# Patient Record
Sex: Female | Born: 1950 | Race: Black or African American | Hispanic: No | Smoking: Former smoker
Health system: Southern US, Community
[De-identification: ages and names within clinical notes are randomized; demographics above are authoritative.]

## PROBLEM LIST (undated history)

## (undated) DIAGNOSIS — M1990 Unspecified osteoarthritis, unspecified site: Secondary | ICD-10-CM

## (undated) DIAGNOSIS — G4700 Insomnia, unspecified: Secondary | ICD-10-CM

## (undated) DIAGNOSIS — G2581 Restless legs syndrome: Secondary | ICD-10-CM

## (undated) DIAGNOSIS — D649 Anemia, unspecified: Secondary | ICD-10-CM

## (undated) DIAGNOSIS — D571 Sickle-cell disease without crisis: Secondary | ICD-10-CM

## (undated) DIAGNOSIS — E0590 Thyrotoxicosis, unspecified without thyrotoxic crisis or storm: Secondary | ICD-10-CM

## (undated) DIAGNOSIS — N83209 Unspecified ovarian cyst, unspecified side: Secondary | ICD-10-CM

## (undated) DIAGNOSIS — J40 Bronchitis, not specified as acute or chronic: Secondary | ICD-10-CM

## (undated) DIAGNOSIS — K589 Irritable bowel syndrome without diarrhea: Secondary | ICD-10-CM

## (undated) DIAGNOSIS — H269 Unspecified cataract: Secondary | ICD-10-CM

## (undated) DIAGNOSIS — K5790 Diverticulosis of intestine, part unspecified, without perforation or abscess without bleeding: Secondary | ICD-10-CM

## (undated) DIAGNOSIS — T7840XA Allergy, unspecified, initial encounter: Secondary | ICD-10-CM

## (undated) DIAGNOSIS — R202 Paresthesia of skin: Secondary | ICD-10-CM

## (undated) DIAGNOSIS — I10 Essential (primary) hypertension: Secondary | ICD-10-CM

## (undated) DIAGNOSIS — D573 Sickle-cell trait: Secondary | ICD-10-CM

## (undated) DIAGNOSIS — K219 Gastro-esophageal reflux disease without esophagitis: Secondary | ICD-10-CM

## (undated) DIAGNOSIS — J449 Chronic obstructive pulmonary disease, unspecified: Secondary | ICD-10-CM

## (undated) DIAGNOSIS — D689 Coagulation defect, unspecified: Secondary | ICD-10-CM

## (undated) DIAGNOSIS — I2699 Other pulmonary embolism without acute cor pulmonale: Secondary | ICD-10-CM

## (undated) HISTORY — DX: Unspecified cataract: H26.9

## (undated) HISTORY — DX: Sickle-cell disease without crisis: D57.1

## (undated) HISTORY — DX: Chronic obstructive pulmonary disease, unspecified: J44.9

## (undated) HISTORY — DX: Gastro-esophageal reflux disease without esophagitis: K21.9

## (undated) HISTORY — PX: OTHER SURGICAL HISTORY: SHX169

## (undated) HISTORY — PX: ROTATOR CUFF REPAIR: SHX139

## (undated) HISTORY — DX: Irritable bowel syndrome, unspecified: K58.9

## (undated) HISTORY — DX: Unspecified ovarian cyst, unspecified side: N83.209

## (undated) HISTORY — DX: Insomnia, unspecified: G47.00

## (undated) HISTORY — DX: Diverticulosis of intestine, part unspecified, without perforation or abscess without bleeding: K57.90

## (undated) HISTORY — PX: KNEE ARTHROSCOPY: SUR90

## (undated) HISTORY — DX: Paresthesia of skin: R20.2

## (undated) HISTORY — PX: TUBAL LIGATION: SHX77

## (undated) HISTORY — PX: OOPHORECTOMY: SHX86

## (undated) HISTORY — DX: Allergy, unspecified, initial encounter: T78.40XA

## (undated) HISTORY — DX: Coagulation defect, unspecified: D68.9

## (undated) HISTORY — DX: Essential (primary) hypertension: I10

## (undated) HISTORY — DX: Bronchitis, not specified as acute or chronic: J40

## (undated) HISTORY — DX: Unspecified osteoarthritis, unspecified site: M19.90

## (undated) HISTORY — PX: UPPER GASTROINTESTINAL ENDOSCOPY: SHX188

## (undated) HISTORY — PX: COLONOSCOPY: SHX174

---

## 1994-11-24 ENCOUNTER — Encounter: Payer: Self-pay | Admitting: Gastroenterology

## 1997-08-05 ENCOUNTER — Encounter: Payer: Self-pay | Admitting: Gastroenterology

## 1997-10-22 ENCOUNTER — Encounter: Payer: Self-pay | Admitting: Emergency Medicine

## 1997-10-22 ENCOUNTER — Emergency Department (HOSPITAL_COMMUNITY): Admission: EM | Admit: 1997-10-22 | Discharge: 1997-10-22 | Payer: Self-pay | Admitting: Emergency Medicine

## 1998-01-29 ENCOUNTER — Ambulatory Visit (HOSPITAL_COMMUNITY): Admission: RE | Admit: 1998-01-29 | Discharge: 1998-01-29 | Payer: Self-pay | Admitting: Gastroenterology

## 1999-01-14 ENCOUNTER — Encounter: Admission: RE | Admit: 1999-01-14 | Discharge: 1999-01-14 | Payer: Self-pay | Admitting: Gynecology

## 1999-01-14 ENCOUNTER — Encounter: Payer: Self-pay | Admitting: Gynecology

## 1999-01-20 ENCOUNTER — Encounter: Admission: RE | Admit: 1999-01-20 | Discharge: 1999-01-20 | Payer: Self-pay | Admitting: Gynecology

## 1999-01-20 ENCOUNTER — Encounter: Payer: Self-pay | Admitting: Gynecology

## 1999-12-02 ENCOUNTER — Other Ambulatory Visit: Admission: RE | Admit: 1999-12-02 | Discharge: 1999-12-02 | Payer: Self-pay | Admitting: Gynecology

## 2000-02-01 ENCOUNTER — Encounter: Admission: RE | Admit: 2000-02-01 | Discharge: 2000-02-01 | Payer: Self-pay | Admitting: Gynecology

## 2000-02-01 ENCOUNTER — Encounter: Payer: Self-pay | Admitting: Gynecology

## 2000-03-29 ENCOUNTER — Ambulatory Visit (HOSPITAL_BASED_OUTPATIENT_CLINIC_OR_DEPARTMENT_OTHER): Admission: RE | Admit: 2000-03-29 | Discharge: 2000-03-29 | Payer: Self-pay | Admitting: Critical Care Medicine

## 2000-06-13 ENCOUNTER — Other Ambulatory Visit: Admission: RE | Admit: 2000-06-13 | Discharge: 2000-06-13 | Payer: Self-pay | Admitting: Gynecology

## 2000-06-15 ENCOUNTER — Encounter (INDEPENDENT_AMBULATORY_CARE_PROVIDER_SITE_OTHER): Payer: Self-pay | Admitting: Specialist

## 2000-06-15 ENCOUNTER — Encounter: Payer: Self-pay | Admitting: Gastroenterology

## 2000-06-15 ENCOUNTER — Other Ambulatory Visit: Admission: RE | Admit: 2000-06-15 | Discharge: 2000-06-15 | Payer: Self-pay | Admitting: Gastroenterology

## 2001-10-10 ENCOUNTER — Emergency Department (HOSPITAL_COMMUNITY): Admission: EM | Admit: 2001-10-10 | Discharge: 2001-10-10 | Payer: Self-pay | Admitting: Emergency Medicine

## 2001-12-28 ENCOUNTER — Other Ambulatory Visit: Admission: RE | Admit: 2001-12-28 | Discharge: 2001-12-28 | Payer: Self-pay | Admitting: Gynecology

## 2002-07-09 ENCOUNTER — Encounter: Admission: RE | Admit: 2002-07-09 | Discharge: 2002-07-09 | Payer: Self-pay | Admitting: Internal Medicine

## 2002-07-09 ENCOUNTER — Encounter: Payer: Self-pay | Admitting: Internal Medicine

## 2002-10-30 ENCOUNTER — Encounter: Payer: Self-pay | Admitting: Internal Medicine

## 2002-10-30 ENCOUNTER — Encounter: Admission: RE | Admit: 2002-10-30 | Discharge: 2002-10-30 | Payer: Self-pay | Admitting: Internal Medicine

## 2003-02-07 ENCOUNTER — Other Ambulatory Visit: Admission: RE | Admit: 2003-02-07 | Discharge: 2003-02-07 | Payer: Self-pay | Admitting: Gynecology

## 2003-12-17 ENCOUNTER — Ambulatory Visit: Payer: Self-pay | Admitting: Pulmonary Disease

## 2004-01-15 ENCOUNTER — Ambulatory Visit: Payer: Self-pay | Admitting: Internal Medicine

## 2004-01-21 ENCOUNTER — Ambulatory Visit: Payer: Self-pay | Admitting: Pulmonary Disease

## 2004-02-19 ENCOUNTER — Other Ambulatory Visit: Admission: RE | Admit: 2004-02-19 | Discharge: 2004-02-19 | Payer: Self-pay | Admitting: Gynecology

## 2004-02-24 ENCOUNTER — Ambulatory Visit: Payer: Self-pay | Admitting: Internal Medicine

## 2004-03-10 ENCOUNTER — Ambulatory Visit: Payer: Self-pay | Admitting: Internal Medicine

## 2004-03-14 ENCOUNTER — Ambulatory Visit: Payer: Self-pay | Admitting: Internal Medicine

## 2004-03-20 ENCOUNTER — Ambulatory Visit: Payer: Self-pay | Admitting: Internal Medicine

## 2004-04-27 ENCOUNTER — Encounter: Admission: RE | Admit: 2004-04-27 | Discharge: 2004-04-27 | Payer: Self-pay | Admitting: Gynecology

## 2004-06-15 ENCOUNTER — Ambulatory Visit: Payer: Self-pay | Admitting: Gastroenterology

## 2004-06-18 ENCOUNTER — Ambulatory Visit: Payer: Self-pay | Admitting: Internal Medicine

## 2004-06-24 ENCOUNTER — Ambulatory Visit: Payer: Self-pay | Admitting: Internal Medicine

## 2004-07-15 ENCOUNTER — Ambulatory Visit: Payer: Self-pay | Admitting: Internal Medicine

## 2004-09-23 ENCOUNTER — Ambulatory Visit: Payer: Self-pay | Admitting: Internal Medicine

## 2004-11-06 ENCOUNTER — Ambulatory Visit: Payer: Self-pay | Admitting: Internal Medicine

## 2004-11-09 ENCOUNTER — Ambulatory Visit: Payer: Self-pay | Admitting: Internal Medicine

## 2004-11-20 ENCOUNTER — Ambulatory Visit: Payer: Self-pay | Admitting: Pulmonary Disease

## 2004-11-30 ENCOUNTER — Ambulatory Visit: Payer: Self-pay | Admitting: Internal Medicine

## 2004-12-15 ENCOUNTER — Ambulatory Visit: Payer: Self-pay | Admitting: Internal Medicine

## 2005-01-26 ENCOUNTER — Ambulatory Visit: Payer: Self-pay | Admitting: Internal Medicine

## 2005-01-27 ENCOUNTER — Ambulatory Visit: Payer: Self-pay | Admitting: Internal Medicine

## 2005-02-04 ENCOUNTER — Ambulatory Visit: Payer: Self-pay | Admitting: Internal Medicine

## 2005-03-10 ENCOUNTER — Ambulatory Visit: Payer: Self-pay | Admitting: Internal Medicine

## 2005-03-31 ENCOUNTER — Ambulatory Visit: Payer: Self-pay | Admitting: Internal Medicine

## 2005-04-09 ENCOUNTER — Ambulatory Visit: Payer: Self-pay | Admitting: Pulmonary Disease

## 2005-05-06 ENCOUNTER — Ambulatory Visit: Payer: Self-pay | Admitting: Internal Medicine

## 2005-05-17 ENCOUNTER — Ambulatory Visit: Payer: Self-pay | Admitting: Pulmonary Disease

## 2005-06-08 ENCOUNTER — Ambulatory Visit: Payer: Self-pay | Admitting: Internal Medicine

## 2005-06-16 ENCOUNTER — Encounter: Payer: Self-pay | Admitting: Emergency Medicine

## 2005-08-03 ENCOUNTER — Other Ambulatory Visit: Admission: RE | Admit: 2005-08-03 | Discharge: 2005-08-03 | Payer: Self-pay | Admitting: Gynecology

## 2005-08-10 ENCOUNTER — Ambulatory Visit: Payer: Self-pay | Admitting: Internal Medicine

## 2005-08-25 ENCOUNTER — Ambulatory Visit: Payer: Self-pay | Admitting: Gastroenterology

## 2005-09-02 ENCOUNTER — Ambulatory Visit: Payer: Self-pay | Admitting: Cardiology

## 2005-09-06 ENCOUNTER — Ambulatory Visit: Payer: Self-pay | Admitting: Internal Medicine

## 2005-09-08 ENCOUNTER — Ambulatory Visit: Payer: Self-pay | Admitting: Gastroenterology

## 2005-09-08 LAB — HM COLONOSCOPY

## 2005-09-28 ENCOUNTER — Ambulatory Visit: Payer: Self-pay | Admitting: Internal Medicine

## 2005-10-14 ENCOUNTER — Ambulatory Visit: Payer: Self-pay | Admitting: Internal Medicine

## 2005-11-09 ENCOUNTER — Ambulatory Visit: Payer: Self-pay | Admitting: Internal Medicine

## 2005-11-10 ENCOUNTER — Ambulatory Visit: Payer: Self-pay | Admitting: Internal Medicine

## 2005-12-01 ENCOUNTER — Ambulatory Visit: Payer: Self-pay | Admitting: Internal Medicine

## 2005-12-08 ENCOUNTER — Ambulatory Visit: Payer: Self-pay | Admitting: Internal Medicine

## 2005-12-10 ENCOUNTER — Ambulatory Visit: Payer: Self-pay | Admitting: Internal Medicine

## 2005-12-15 ENCOUNTER — Ambulatory Visit: Payer: Self-pay | Admitting: Internal Medicine

## 2005-12-24 ENCOUNTER — Ambulatory Visit: Payer: Self-pay | Admitting: Internal Medicine

## 2005-12-27 ENCOUNTER — Ambulatory Visit: Payer: Self-pay | Admitting: Internal Medicine

## 2006-01-03 ENCOUNTER — Ambulatory Visit: Payer: Self-pay | Admitting: Internal Medicine

## 2006-01-05 ENCOUNTER — Ambulatory Visit: Payer: Self-pay | Admitting: Internal Medicine

## 2006-01-11 ENCOUNTER — Ambulatory Visit: Payer: Self-pay | Admitting: Internal Medicine

## 2006-01-17 ENCOUNTER — Ambulatory Visit: Payer: Self-pay | Admitting: Internal Medicine

## 2006-02-01 ENCOUNTER — Ambulatory Visit: Payer: Self-pay | Admitting: Internal Medicine

## 2006-02-16 ENCOUNTER — Ambulatory Visit: Payer: Self-pay | Admitting: Internal Medicine

## 2006-03-02 ENCOUNTER — Ambulatory Visit: Payer: Self-pay | Admitting: Internal Medicine

## 2006-03-08 ENCOUNTER — Ambulatory Visit: Payer: Self-pay | Admitting: Internal Medicine

## 2006-03-25 ENCOUNTER — Ambulatory Visit: Payer: Self-pay | Admitting: Internal Medicine

## 2006-04-01 ENCOUNTER — Ambulatory Visit: Payer: Self-pay | Admitting: Internal Medicine

## 2006-04-11 ENCOUNTER — Ambulatory Visit: Payer: Self-pay | Admitting: Internal Medicine

## 2006-04-22 ENCOUNTER — Ambulatory Visit: Payer: Self-pay | Admitting: Internal Medicine

## 2006-04-29 ENCOUNTER — Ambulatory Visit: Payer: Self-pay | Admitting: Internal Medicine

## 2006-05-13 ENCOUNTER — Ambulatory Visit: Payer: Self-pay | Admitting: Internal Medicine

## 2006-05-16 ENCOUNTER — Ambulatory Visit: Payer: Self-pay | Admitting: Internal Medicine

## 2006-05-16 ENCOUNTER — Encounter: Payer: Self-pay | Admitting: Internal Medicine

## 2006-05-16 LAB — CONVERTED CEMR LAB
BUN: 6 mg/dL (ref 6–23)
Basophils Absolute: 0 10*3/uL (ref 0.0–0.1)
Basophils Relative: 0.2 % (ref 0.0–1.0)
Bilirubin Urine: NEGATIVE
Bilirubin, Direct: 0.1 mg/dL (ref 0.0–0.3)
Chloride: 107 meq/L (ref 96–112)
Direct LDL: 172.8 mg/dL
Eosinophils Relative: 3.2 % (ref 0.0–5.0)
HCT: 39 % (ref 36.0–46.0)
Hemoglobin, Urine: NEGATIVE
Hemoglobin: 13.7 g/dL (ref 12.0–15.0)
MCHC: 35 g/dL (ref 30.0–36.0)
MCV: 87 fL (ref 78.0–100.0)
Nitrite: NEGATIVE
Platelets: 271 10*3/uL (ref 150–400)
Specific Gravity, Urine: 1.01 (ref 1.000–1.03)
Total Protein, Urine: NEGATIVE mg/dL
VLDL: 23 mg/dL (ref 0–40)
WBC: 4.4 10*3/uL — ABNORMAL LOW (ref 4.5–10.5)

## 2006-06-03 ENCOUNTER — Ambulatory Visit: Payer: Self-pay | Admitting: Internal Medicine

## 2006-06-07 ENCOUNTER — Ambulatory Visit: Payer: Self-pay | Admitting: Internal Medicine

## 2006-06-15 ENCOUNTER — Ambulatory Visit: Payer: Self-pay | Admitting: Internal Medicine

## 2006-06-23 ENCOUNTER — Ambulatory Visit: Payer: Self-pay | Admitting: Internal Medicine

## 2006-07-01 ENCOUNTER — Ambulatory Visit: Payer: Self-pay | Admitting: Internal Medicine

## 2006-07-04 ENCOUNTER — Ambulatory Visit: Payer: Self-pay | Admitting: Internal Medicine

## 2006-07-07 ENCOUNTER — Ambulatory Visit: Payer: Self-pay | Admitting: Internal Medicine

## 2006-07-22 ENCOUNTER — Ambulatory Visit: Payer: Self-pay | Admitting: Internal Medicine

## 2006-07-25 ENCOUNTER — Ambulatory Visit: Payer: Self-pay | Admitting: Internal Medicine

## 2006-09-09 ENCOUNTER — Ambulatory Visit: Payer: Self-pay | Admitting: Internal Medicine

## 2006-09-13 ENCOUNTER — Ambulatory Visit: Payer: Self-pay | Admitting: Internal Medicine

## 2006-10-07 ENCOUNTER — Ambulatory Visit: Payer: Self-pay | Admitting: Internal Medicine

## 2006-10-08 ENCOUNTER — Ambulatory Visit: Payer: Self-pay | Admitting: Internal Medicine

## 2006-10-21 ENCOUNTER — Other Ambulatory Visit: Admission: RE | Admit: 2006-10-21 | Discharge: 2006-10-21 | Payer: Self-pay | Admitting: Gynecology

## 2006-10-26 ENCOUNTER — Ambulatory Visit: Payer: Self-pay | Admitting: Internal Medicine

## 2006-11-07 ENCOUNTER — Ambulatory Visit: Payer: Self-pay | Admitting: Internal Medicine

## 2006-11-07 ENCOUNTER — Ambulatory Visit: Payer: Self-pay | Admitting: Emergency Medicine

## 2006-11-24 DIAGNOSIS — N83209 Unspecified ovarian cyst, unspecified side: Secondary | ICD-10-CM | POA: Insufficient documentation

## 2006-11-24 DIAGNOSIS — H6980 Other specified disorders of Eustachian tube, unspecified ear: Secondary | ICD-10-CM | POA: Insufficient documentation

## 2006-11-24 DIAGNOSIS — H6990 Unspecified Eustachian tube disorder, unspecified ear: Secondary | ICD-10-CM | POA: Insufficient documentation

## 2006-11-24 DIAGNOSIS — J302 Other seasonal allergic rhinitis: Secondary | ICD-10-CM | POA: Insufficient documentation

## 2006-11-24 DIAGNOSIS — R059 Cough, unspecified: Secondary | ICD-10-CM | POA: Insufficient documentation

## 2006-11-24 DIAGNOSIS — J3089 Other allergic rhinitis: Secondary | ICD-10-CM

## 2006-11-24 DIAGNOSIS — L659 Nonscarring hair loss, unspecified: Secondary | ICD-10-CM | POA: Insufficient documentation

## 2006-11-24 DIAGNOSIS — R05 Cough: Secondary | ICD-10-CM

## 2006-11-24 DIAGNOSIS — F17200 Nicotine dependence, unspecified, uncomplicated: Secondary | ICD-10-CM | POA: Insufficient documentation

## 2006-11-24 DIAGNOSIS — K5730 Diverticulosis of large intestine without perforation or abscess without bleeding: Secondary | ICD-10-CM | POA: Insufficient documentation

## 2006-11-24 DIAGNOSIS — Z720 Tobacco use: Secondary | ICD-10-CM | POA: Insufficient documentation

## 2006-11-25 ENCOUNTER — Ambulatory Visit: Payer: Self-pay | Admitting: Internal Medicine

## 2006-12-15 ENCOUNTER — Ambulatory Visit: Payer: Self-pay | Admitting: Internal Medicine

## 2006-12-21 ENCOUNTER — Encounter: Payer: Self-pay | Admitting: Internal Medicine

## 2006-12-21 DIAGNOSIS — Z86010 Personal history of colonic polyps: Secondary | ICD-10-CM | POA: Insufficient documentation

## 2006-12-21 DIAGNOSIS — J45909 Unspecified asthma, uncomplicated: Secondary | ICD-10-CM | POA: Insufficient documentation

## 2006-12-21 DIAGNOSIS — K219 Gastro-esophageal reflux disease without esophagitis: Secondary | ICD-10-CM | POA: Insufficient documentation

## 2006-12-21 DIAGNOSIS — R209 Unspecified disturbances of skin sensation: Secondary | ICD-10-CM | POA: Insufficient documentation

## 2006-12-23 ENCOUNTER — Ambulatory Visit: Payer: Self-pay | Admitting: Internal Medicine

## 2006-12-29 ENCOUNTER — Ambulatory Visit: Payer: Self-pay | Admitting: Internal Medicine

## 2006-12-29 ENCOUNTER — Encounter: Payer: Self-pay | Admitting: Internal Medicine

## 2007-01-10 ENCOUNTER — Emergency Department (HOSPITAL_COMMUNITY): Admission: EM | Admit: 2007-01-10 | Discharge: 2007-01-10 | Payer: Self-pay | Admitting: Emergency Medicine

## 2007-01-11 ENCOUNTER — Ambulatory Visit: Payer: Self-pay | Admitting: Internal Medicine

## 2007-01-19 ENCOUNTER — Ambulatory Visit: Payer: Self-pay | Admitting: Internal Medicine

## 2007-02-03 ENCOUNTER — Ambulatory Visit: Payer: Self-pay | Admitting: Internal Medicine

## 2007-02-22 ENCOUNTER — Telehealth (INDEPENDENT_AMBULATORY_CARE_PROVIDER_SITE_OTHER): Payer: Self-pay | Admitting: *Deleted

## 2007-02-28 ENCOUNTER — Ambulatory Visit: Payer: Self-pay | Admitting: Internal Medicine

## 2007-02-28 ENCOUNTER — Ambulatory Visit: Payer: Self-pay | Admitting: Gastroenterology

## 2007-02-28 LAB — CONVERTED CEMR LAB
Alkaline Phosphatase: 79 units/L (ref 39–117)
Basophils Absolute: 0 10*3/uL (ref 0.0–0.1)
CO2: 32 meq/L (ref 19–32)
Calcium: 9.4 mg/dL (ref 8.4–10.5)
Chloride: 103 meq/L (ref 96–112)
Creatinine, Ser: 0.7 mg/dL (ref 0.4–1.2)
Eosinophils Absolute: 0.2 10*3/uL (ref 0.0–0.6)
Eosinophils Relative: 3.8 % (ref 0.0–5.0)
GFR calc Af Amer: 112 mL/min
Glucose, Bld: 95 mg/dL (ref 70–99)
HCT: 38.1 % (ref 36.0–46.0)
Lipase: 21 units/L (ref 11.0–59.0)
MCHC: 34.4 g/dL (ref 30.0–36.0)
MCV: 87.4 fL (ref 78.0–100.0)
Neutro Abs: 1.8 10*3/uL (ref 1.4–7.7)
Neutrophils Relative %: 35.4 % — ABNORMAL LOW (ref 43.0–77.0)
Platelets: 220 10*3/uL (ref 150–400)
RBC: 4.36 M/uL (ref 3.87–5.11)
RDW: 12.5 % (ref 11.5–14.6)
Total Bilirubin: 0.9 mg/dL (ref 0.3–1.2)
Total Protein: 7.5 g/dL (ref 6.0–8.3)
WBC: 5 10*3/uL (ref 4.5–10.5)

## 2007-03-06 ENCOUNTER — Ambulatory Visit: Payer: Self-pay | Admitting: Internal Medicine

## 2007-03-06 ENCOUNTER — Ambulatory Visit: Payer: Self-pay | Admitting: Cardiovascular Disease

## 2007-03-22 ENCOUNTER — Ambulatory Visit: Payer: Self-pay | Admitting: Gastroenterology

## 2007-03-22 ENCOUNTER — Ambulatory Visit: Payer: Self-pay | Admitting: Internal Medicine

## 2007-03-23 ENCOUNTER — Ambulatory Visit: Payer: Self-pay | Admitting: Internal Medicine

## 2007-03-31 ENCOUNTER — Ambulatory Visit: Payer: Self-pay | Admitting: Internal Medicine

## 2007-03-31 ENCOUNTER — Encounter: Payer: Self-pay | Admitting: Internal Medicine

## 2007-04-21 ENCOUNTER — Ambulatory Visit: Payer: Self-pay | Admitting: Internal Medicine

## 2007-04-21 DIAGNOSIS — R5381 Other malaise: Secondary | ICD-10-CM | POA: Insufficient documentation

## 2007-04-21 DIAGNOSIS — G4700 Insomnia, unspecified: Secondary | ICD-10-CM | POA: Insufficient documentation

## 2007-04-21 DIAGNOSIS — R5383 Other fatigue: Secondary | ICD-10-CM

## 2007-04-26 ENCOUNTER — Telehealth: Payer: Self-pay | Admitting: Internal Medicine

## 2007-04-26 LAB — CONVERTED CEMR LAB
Bilirubin Urine: NEGATIVE
Hemoglobin, Urine: NEGATIVE
Ketones, ur: NEGATIVE mg/dL
Mucus, UA: NEGATIVE
Nitrite: NEGATIVE
Specific Gravity, Urine: 1.02 (ref 1.000–1.03)
Total Protein, Urine: NEGATIVE mg/dL
Urobilinogen, UA: 0.2 (ref 0.0–1.0)

## 2007-04-27 ENCOUNTER — Encounter: Payer: Self-pay | Admitting: Internal Medicine

## 2007-05-04 ENCOUNTER — Telehealth: Payer: Self-pay | Admitting: Internal Medicine

## 2007-05-05 ENCOUNTER — Ambulatory Visit: Payer: Self-pay | Admitting: Internal Medicine

## 2007-05-05 LAB — CONVERTED CEMR LAB
Leukocytes, UA: NEGATIVE
Nitrite: NEGATIVE
Specific Gravity, Urine: 1.025 (ref 1.000–1.03)
Urine Glucose: NEGATIVE mg/dL
pH: 5.5 (ref 5.0–8.0)

## 2007-05-19 ENCOUNTER — Ambulatory Visit: Payer: Self-pay | Admitting: Internal Medicine

## 2007-05-20 ENCOUNTER — Ambulatory Visit: Payer: Self-pay | Admitting: Family Medicine

## 2007-06-06 ENCOUNTER — Ambulatory Visit: Payer: Self-pay | Admitting: Internal Medicine

## 2007-06-15 ENCOUNTER — Ambulatory Visit: Payer: Self-pay | Admitting: Internal Medicine

## 2007-06-26 ENCOUNTER — Ambulatory Visit: Payer: Self-pay | Admitting: Internal Medicine

## 2007-07-11 ENCOUNTER — Ambulatory Visit: Payer: Self-pay | Admitting: Internal Medicine

## 2007-07-26 ENCOUNTER — Ambulatory Visit: Payer: Self-pay | Admitting: Internal Medicine

## 2007-08-04 ENCOUNTER — Ambulatory Visit: Payer: Self-pay | Admitting: Internal Medicine

## 2007-08-17 ENCOUNTER — Ambulatory Visit: Payer: Self-pay | Admitting: Internal Medicine

## 2007-08-29 ENCOUNTER — Ambulatory Visit: Payer: Self-pay | Admitting: Internal Medicine

## 2007-09-15 ENCOUNTER — Ambulatory Visit: Payer: Self-pay | Admitting: Internal Medicine

## 2007-09-27 ENCOUNTER — Ambulatory Visit: Payer: Self-pay | Admitting: Internal Medicine

## 2007-09-27 ENCOUNTER — Encounter: Admission: RE | Admit: 2007-09-27 | Discharge: 2007-09-27 | Payer: Self-pay | Admitting: Gynecology

## 2007-10-20 ENCOUNTER — Ambulatory Visit: Payer: Self-pay | Admitting: Internal Medicine

## 2007-10-31 ENCOUNTER — Ambulatory Visit: Payer: Self-pay | Admitting: Internal Medicine

## 2007-10-31 DIAGNOSIS — J039 Acute tonsillitis, unspecified: Secondary | ICD-10-CM | POA: Insufficient documentation

## 2007-11-08 ENCOUNTER — Ambulatory Visit: Payer: Self-pay | Admitting: Internal Medicine

## 2007-12-12 ENCOUNTER — Ambulatory Visit: Payer: Self-pay | Admitting: Internal Medicine

## 2007-12-21 ENCOUNTER — Ambulatory Visit: Payer: Self-pay | Admitting: Internal Medicine

## 2008-01-05 ENCOUNTER — Ambulatory Visit: Payer: Self-pay | Admitting: Pulmonary Disease

## 2008-01-08 ENCOUNTER — Ambulatory Visit: Payer: Self-pay | Admitting: Internal Medicine

## 2008-01-09 ENCOUNTER — Ambulatory Visit: Payer: Self-pay | Admitting: Internal Medicine

## 2008-01-17 ENCOUNTER — Ambulatory Visit: Payer: Self-pay | Admitting: Internal Medicine

## 2008-01-17 DIAGNOSIS — I10 Essential (primary) hypertension: Secondary | ICD-10-CM | POA: Insufficient documentation

## 2008-01-26 ENCOUNTER — Ambulatory Visit: Payer: Self-pay | Admitting: Internal Medicine

## 2008-02-01 ENCOUNTER — Telehealth (INDEPENDENT_AMBULATORY_CARE_PROVIDER_SITE_OTHER): Payer: Self-pay | Admitting: *Deleted

## 2008-02-05 ENCOUNTER — Ambulatory Visit: Payer: Self-pay | Admitting: Internal Medicine

## 2008-02-23 ENCOUNTER — Ambulatory Visit: Payer: Self-pay | Admitting: Internal Medicine

## 2008-02-26 ENCOUNTER — Telehealth: Payer: Self-pay | Admitting: Internal Medicine

## 2008-03-01 ENCOUNTER — Ambulatory Visit: Payer: Self-pay | Admitting: Internal Medicine

## 2008-03-15 ENCOUNTER — Ambulatory Visit: Payer: Self-pay | Admitting: Internal Medicine

## 2008-04-03 ENCOUNTER — Ambulatory Visit: Payer: Self-pay | Admitting: Internal Medicine

## 2008-04-09 ENCOUNTER — Ambulatory Visit: Payer: Self-pay | Admitting: Internal Medicine

## 2008-04-19 ENCOUNTER — Ambulatory Visit: Payer: Self-pay | Admitting: Internal Medicine

## 2008-04-30 ENCOUNTER — Encounter: Payer: Self-pay | Admitting: Internal Medicine

## 2008-04-30 ENCOUNTER — Ambulatory Visit: Payer: Self-pay | Admitting: Internal Medicine

## 2008-04-30 DIAGNOSIS — J019 Acute sinusitis, unspecified: Secondary | ICD-10-CM | POA: Insufficient documentation

## 2008-05-01 LAB — CONVERTED CEMR LAB
AST: 18 units/L (ref 0–37)
BUN: 12 mg/dL (ref 6–23)
Basophils Absolute: 0 10*3/uL (ref 0.0–0.1)
Basophils Relative: 0 % (ref 0.0–3.0)
Bilirubin, Direct: 0.1 mg/dL (ref 0.0–0.3)
Calcium: 9.4 mg/dL (ref 8.4–10.5)
Cholesterol: 291 mg/dL — ABNORMAL HIGH (ref 0–200)
Creatinine, Ser: 0.7 mg/dL (ref 0.4–1.2)
GFR calc non Af Amer: 111.42 mL/min (ref >60)
HDL: 61.4 mg/dL (ref >39.00)
Hemoglobin: 14.4 g/dL (ref 12.0–15.0)
Ketones, ur: NEGATIVE mg/dL
Leukocytes, UA: NEGATIVE
Lymphocytes Relative: 28.6 % (ref 12.0–46.0)
Lymphs Abs: 1.5 10*3/uL (ref 0.7–4.0)
MCV: 86.8 fL (ref 78.0–100.0)
Monocytes Relative: 2.9 % — ABNORMAL LOW (ref 3.0–12.0)
Neutrophils Relative %: 67.1 % (ref 43.0–77.0)
RDW: 12.7 % (ref 11.5–14.6)
Sodium: 142 meq/L (ref 135–145)
TSH: 1.42 microintl units/mL (ref 0.35–5.50)
Total Bilirubin: 1.2 mg/dL (ref 0.3–1.2)
Total Protein: 8.3 g/dL (ref 6.0–8.3)
Urine Glucose: NEGATIVE mg/dL
Urobilinogen, UA: 0.2 (ref 0.0–1.0)
pH: 5.5 (ref 5.0–8.0)

## 2008-05-15 ENCOUNTER — Telehealth: Payer: Self-pay | Admitting: Internal Medicine

## 2008-05-20 ENCOUNTER — Telehealth (INDEPENDENT_AMBULATORY_CARE_PROVIDER_SITE_OTHER): Payer: Self-pay | Admitting: *Deleted

## 2008-05-31 ENCOUNTER — Ambulatory Visit: Payer: Self-pay | Admitting: Internal Medicine

## 2008-06-13 ENCOUNTER — Ambulatory Visit: Payer: Self-pay | Admitting: Internal Medicine

## 2008-07-01 ENCOUNTER — Ambulatory Visit: Payer: Self-pay | Admitting: Internal Medicine

## 2008-07-11 ENCOUNTER — Ambulatory Visit: Payer: Self-pay | Admitting: Internal Medicine

## 2008-07-17 ENCOUNTER — Ambulatory Visit: Payer: Self-pay | Admitting: Internal Medicine

## 2008-07-17 DIAGNOSIS — R51 Headache: Secondary | ICD-10-CM | POA: Insufficient documentation

## 2008-07-17 DIAGNOSIS — M25569 Pain in unspecified knee: Secondary | ICD-10-CM | POA: Insufficient documentation

## 2008-07-17 DIAGNOSIS — R519 Headache, unspecified: Secondary | ICD-10-CM | POA: Insufficient documentation

## 2008-07-25 ENCOUNTER — Ambulatory Visit: Payer: Self-pay | Admitting: Internal Medicine

## 2008-07-29 ENCOUNTER — Ambulatory Visit: Payer: Self-pay | Admitting: Cardiology

## 2008-08-23 ENCOUNTER — Ambulatory Visit: Payer: Self-pay | Admitting: Internal Medicine

## 2008-08-26 ENCOUNTER — Ambulatory Visit: Payer: Self-pay | Admitting: Internal Medicine

## 2008-08-26 DIAGNOSIS — I959 Hypotension, unspecified: Secondary | ICD-10-CM | POA: Insufficient documentation

## 2008-08-26 DIAGNOSIS — R7309 Other abnormal glucose: Secondary | ICD-10-CM | POA: Insufficient documentation

## 2008-08-26 DIAGNOSIS — R9431 Abnormal electrocardiogram [ECG] [EKG]: Secondary | ICD-10-CM | POA: Insufficient documentation

## 2008-08-26 LAB — CONVERTED CEMR LAB
Albumin: 3.9 g/dL (ref 3.5–5.2)
BUN: 13 mg/dL (ref 6–23)
Basophils Absolute: 0 10*3/uL (ref 0.0–0.1)
Bilirubin Urine: NEGATIVE
Bilirubin, Direct: 0.1 mg/dL (ref 0.0–0.3)
Calcium: 9.3 mg/dL (ref 8.4–10.5)
Chloride: 103 meq/L (ref 96–112)
Eosinophils Relative: 1.1 % (ref 0.0–5.0)
Glucose, Bld: 94 mg/dL (ref 70–99)
HCT: 36.6 % (ref 36.0–46.0)
Hgb A1c MFr Bld: 6 % (ref 4.6–6.5)
Iron: 106 ug/dL (ref 42–145)
Lymphocytes Relative: 38.6 % (ref 12.0–46.0)
Neutro Abs: 2.9 10*3/uL (ref 1.4–7.7)
Nitrite: NEGATIVE
Potassium: 3.5 meq/L (ref 3.5–5.1)
Sodium: 144 meq/L (ref 135–145)
Specific Gravity, Urine: 1.025 (ref 1.000–1.030)
TSH: 0.67 microintl units/mL (ref 0.35–5.50)
Total Protein: 7.7 g/dL (ref 6.0–8.3)
Transferrin: 200.6 mg/dL — ABNORMAL LOW (ref 212.0–360.0)
Urine Glucose: NEGATIVE mg/dL
Urobilinogen, UA: 0.2 (ref 0.0–1.0)
Vitamin B-12: 366 pg/mL (ref 211–911)
WBC: 6.1 10*3/uL (ref 4.5–10.5)

## 2008-08-27 ENCOUNTER — Ambulatory Visit: Payer: Self-pay | Admitting: Internal Medicine

## 2008-08-29 ENCOUNTER — Telehealth (INDEPENDENT_AMBULATORY_CARE_PROVIDER_SITE_OTHER): Payer: Self-pay | Admitting: *Deleted

## 2008-09-02 ENCOUNTER — Encounter: Payer: Self-pay | Admitting: Cardiovascular Disease

## 2008-09-02 ENCOUNTER — Ambulatory Visit: Payer: Self-pay

## 2008-09-03 ENCOUNTER — Ambulatory Visit: Payer: Self-pay | Admitting: Internal Medicine

## 2008-09-03 DIAGNOSIS — R609 Edema, unspecified: Secondary | ICD-10-CM | POA: Insufficient documentation

## 2008-09-20 ENCOUNTER — Ambulatory Visit: Payer: Self-pay | Admitting: Internal Medicine

## 2008-10-03 ENCOUNTER — Ambulatory Visit: Payer: Self-pay | Admitting: Internal Medicine

## 2008-10-07 ENCOUNTER — Telehealth: Payer: Self-pay | Admitting: Internal Medicine

## 2008-10-09 ENCOUNTER — Ambulatory Visit: Payer: Self-pay | Admitting: Internal Medicine

## 2008-10-22 ENCOUNTER — Ambulatory Visit: Payer: Self-pay | Admitting: Internal Medicine

## 2008-11-08 ENCOUNTER — Ambulatory Visit: Payer: Self-pay | Admitting: Internal Medicine

## 2008-11-28 ENCOUNTER — Ambulatory Visit: Payer: Self-pay | Admitting: Internal Medicine

## 2008-12-13 ENCOUNTER — Ambulatory Visit: Payer: Self-pay | Admitting: Internal Medicine

## 2008-12-16 ENCOUNTER — Ambulatory Visit: Payer: Self-pay | Admitting: Internal Medicine

## 2008-12-16 DIAGNOSIS — R198 Other specified symptoms and signs involving the digestive system and abdomen: Secondary | ICD-10-CM | POA: Insufficient documentation

## 2008-12-30 ENCOUNTER — Encounter: Payer: Self-pay | Admitting: Internal Medicine

## 2009-01-02 ENCOUNTER — Ambulatory Visit: Payer: Self-pay | Admitting: Internal Medicine

## 2009-01-06 ENCOUNTER — Encounter: Payer: Self-pay | Admitting: Internal Medicine

## 2009-01-13 ENCOUNTER — Ambulatory Visit: Payer: Self-pay | Admitting: Internal Medicine

## 2009-01-13 ENCOUNTER — Ambulatory Visit: Payer: Self-pay | Admitting: Gastroenterology

## 2009-01-13 DIAGNOSIS — K5900 Constipation, unspecified: Secondary | ICD-10-CM | POA: Insufficient documentation

## 2009-01-13 DIAGNOSIS — R109 Unspecified abdominal pain: Secondary | ICD-10-CM | POA: Insufficient documentation

## 2009-01-23 ENCOUNTER — Encounter: Admission: RE | Admit: 2009-01-23 | Discharge: 2009-01-23 | Payer: Self-pay | Admitting: Gynecology

## 2009-01-23 ENCOUNTER — Ambulatory Visit: Payer: Self-pay | Admitting: Internal Medicine

## 2009-01-28 ENCOUNTER — Ambulatory Visit: Payer: Self-pay | Admitting: Internal Medicine

## 2009-02-04 ENCOUNTER — Ambulatory Visit: Payer: Self-pay | Admitting: Internal Medicine

## 2009-02-06 ENCOUNTER — Telehealth: Payer: Self-pay | Admitting: Internal Medicine

## 2009-02-26 ENCOUNTER — Ambulatory Visit: Payer: Self-pay | Admitting: Internal Medicine

## 2009-03-20 ENCOUNTER — Ambulatory Visit: Payer: Self-pay | Admitting: Internal Medicine

## 2009-03-27 ENCOUNTER — Ambulatory Visit: Payer: Self-pay | Admitting: Internal Medicine

## 2009-04-04 ENCOUNTER — Ambulatory Visit: Payer: Self-pay | Admitting: Internal Medicine

## 2009-04-07 ENCOUNTER — Ambulatory Visit: Payer: Self-pay | Admitting: Internal Medicine

## 2009-04-09 ENCOUNTER — Ambulatory Visit: Payer: Self-pay | Admitting: Internal Medicine

## 2009-04-15 ENCOUNTER — Emergency Department (HOSPITAL_COMMUNITY): Admission: EM | Admit: 2009-04-15 | Discharge: 2009-04-15 | Payer: Self-pay | Admitting: Emergency Medicine

## 2009-04-15 ENCOUNTER — Ambulatory Visit: Payer: Self-pay | Admitting: Internal Medicine

## 2009-04-15 DIAGNOSIS — R4182 Altered mental status, unspecified: Secondary | ICD-10-CM | POA: Insufficient documentation

## 2009-04-18 ENCOUNTER — Ambulatory Visit: Payer: Self-pay | Admitting: Cardiology

## 2009-04-18 ENCOUNTER — Ambulatory Visit: Payer: Self-pay | Admitting: Internal Medicine

## 2009-04-18 ENCOUNTER — Telehealth: Payer: Self-pay | Admitting: Internal Medicine

## 2009-04-18 DIAGNOSIS — J449 Chronic obstructive pulmonary disease, unspecified: Secondary | ICD-10-CM | POA: Insufficient documentation

## 2009-04-18 DIAGNOSIS — M79609 Pain in unspecified limb: Secondary | ICD-10-CM | POA: Insufficient documentation

## 2009-04-18 DIAGNOSIS — R0602 Shortness of breath: Secondary | ICD-10-CM | POA: Insufficient documentation

## 2009-04-22 ENCOUNTER — Ambulatory Visit: Payer: Self-pay | Admitting: Internal Medicine

## 2009-04-23 ENCOUNTER — Ambulatory Visit: Payer: Self-pay | Admitting: Internal Medicine

## 2009-04-23 DIAGNOSIS — E876 Hypokalemia: Secondary | ICD-10-CM | POA: Insufficient documentation

## 2009-04-24 LAB — CONVERTED CEMR LAB
BUN: 12 mg/dL (ref 6–23)
Bilirubin Urine: NEGATIVE
CO2: 34 meq/L — ABNORMAL HIGH (ref 19–32)
Creatinine, Ser: 0.7 mg/dL (ref 0.4–1.2)
Hemoglobin, Urine: NEGATIVE
Ketones, ur: NEGATIVE mg/dL
Leukocytes, UA: NEGATIVE
Nitrite: NEGATIVE
Potassium: 3.3 meq/L — ABNORMAL LOW (ref 3.5–5.1)
Sodium: 142 meq/L (ref 135–145)
TSH: 1.72 microintl units/mL (ref 0.35–5.50)
Total Protein, Urine: NEGATIVE mg/dL
Urine Glucose: NEGATIVE mg/dL
pH: 6 (ref 5.0–8.0)

## 2009-04-28 ENCOUNTER — Encounter: Admission: RE | Admit: 2009-04-28 | Discharge: 2009-04-28 | Payer: Self-pay | Admitting: Internal Medicine

## 2009-04-28 ENCOUNTER — Ambulatory Visit: Payer: Self-pay | Admitting: Internal Medicine

## 2009-04-29 ENCOUNTER — Telehealth: Payer: Self-pay | Admitting: Internal Medicine

## 2009-05-07 ENCOUNTER — Ambulatory Visit: Payer: Self-pay | Admitting: Internal Medicine

## 2009-05-23 ENCOUNTER — Ambulatory Visit: Payer: Self-pay | Admitting: Internal Medicine

## 2009-06-13 ENCOUNTER — Ambulatory Visit: Payer: Self-pay | Admitting: Internal Medicine

## 2009-06-20 ENCOUNTER — Ambulatory Visit: Payer: Self-pay | Admitting: Internal Medicine

## 2009-06-27 ENCOUNTER — Ambulatory Visit: Payer: Self-pay | Admitting: Internal Medicine

## 2009-07-05 ENCOUNTER — Ambulatory Visit: Payer: Self-pay | Admitting: Family Medicine

## 2009-07-05 DIAGNOSIS — J020 Streptococcal pharyngitis: Secondary | ICD-10-CM | POA: Insufficient documentation

## 2009-07-07 ENCOUNTER — Telehealth: Payer: Self-pay | Admitting: Internal Medicine

## 2009-07-07 ENCOUNTER — Encounter: Payer: Self-pay | Admitting: Internal Medicine

## 2009-07-10 ENCOUNTER — Ambulatory Visit: Payer: Self-pay | Admitting: Internal Medicine

## 2009-07-18 ENCOUNTER — Ambulatory Visit: Payer: Self-pay | Admitting: Internal Medicine

## 2009-07-25 ENCOUNTER — Ambulatory Visit: Payer: Self-pay | Admitting: Internal Medicine

## 2009-07-28 ENCOUNTER — Ambulatory Visit: Payer: Self-pay | Admitting: Internal Medicine

## 2009-07-28 DIAGNOSIS — J310 Chronic rhinitis: Secondary | ICD-10-CM | POA: Insufficient documentation

## 2009-08-11 ENCOUNTER — Telehealth (INDEPENDENT_AMBULATORY_CARE_PROVIDER_SITE_OTHER): Payer: Self-pay | Admitting: *Deleted

## 2009-08-22 ENCOUNTER — Ambulatory Visit: Payer: Self-pay | Admitting: Internal Medicine

## 2009-09-05 ENCOUNTER — Ambulatory Visit: Payer: Self-pay | Admitting: Internal Medicine

## 2009-09-12 ENCOUNTER — Telehealth: Payer: Self-pay | Admitting: Internal Medicine

## 2009-09-19 ENCOUNTER — Ambulatory Visit: Payer: Self-pay | Admitting: Internal Medicine

## 2009-10-03 ENCOUNTER — Ambulatory Visit: Payer: Self-pay | Admitting: Internal Medicine

## 2009-10-17 ENCOUNTER — Ambulatory Visit: Payer: Self-pay | Admitting: Internal Medicine

## 2009-10-21 ENCOUNTER — Ambulatory Visit: Payer: Self-pay | Admitting: Internal Medicine

## 2009-10-21 ENCOUNTER — Encounter (INDEPENDENT_AMBULATORY_CARE_PROVIDER_SITE_OTHER): Payer: Self-pay | Admitting: *Deleted

## 2009-10-22 ENCOUNTER — Ambulatory Visit: Payer: Self-pay | Admitting: Internal Medicine

## 2009-10-24 ENCOUNTER — Telehealth: Payer: Self-pay | Admitting: Internal Medicine

## 2009-11-14 ENCOUNTER — Ambulatory Visit: Payer: Self-pay | Admitting: Internal Medicine

## 2009-12-11 ENCOUNTER — Ambulatory Visit: Payer: Self-pay | Admitting: Internal Medicine

## 2009-12-16 ENCOUNTER — Ambulatory Visit: Payer: Self-pay | Admitting: Internal Medicine

## 2009-12-23 ENCOUNTER — Ambulatory Visit: Payer: Self-pay | Admitting: Internal Medicine

## 2009-12-23 DIAGNOSIS — J209 Acute bronchitis, unspecified: Secondary | ICD-10-CM | POA: Insufficient documentation

## 2010-01-02 ENCOUNTER — Ambulatory Visit: Payer: Self-pay | Admitting: Internal Medicine

## 2010-01-05 ENCOUNTER — Ambulatory Visit: Payer: Self-pay | Admitting: Internal Medicine

## 2010-01-16 ENCOUNTER — Ambulatory Visit: Payer: Self-pay | Admitting: Internal Medicine

## 2010-02-10 ENCOUNTER — Ambulatory Visit: Payer: Self-pay | Admitting: Internal Medicine

## 2010-02-27 ENCOUNTER — Encounter: Payer: Self-pay | Admitting: Internal Medicine

## 2010-03-02 ENCOUNTER — Ambulatory Visit: Payer: Self-pay | Admitting: Internal Medicine

## 2010-03-06 ENCOUNTER — Encounter
Admission: RE | Admit: 2010-03-06 | Discharge: 2010-03-06 | Payer: Self-pay | Source: Home / Self Care | Attending: Gynecology | Admitting: Gynecology

## 2010-03-06 ENCOUNTER — Ambulatory Visit: Payer: Self-pay | Admitting: Internal Medicine

## 2010-03-08 ENCOUNTER — Encounter: Payer: Self-pay | Admitting: Internal Medicine

## 2010-03-08 ENCOUNTER — Encounter: Payer: Self-pay | Admitting: Gynecology

## 2010-03-12 ENCOUNTER — Ambulatory Visit: Payer: Self-pay | Admitting: Internal Medicine

## 2010-03-17 NOTE — Assessment & Plan Note (Signed)
Summary: pnuemonia/ mbw   Copy to:  Tresa Garter MD Primary Provider/Referring Provider:  Tresa Garter MD  CC:  Follow up from Urgent Care visit at [facility]; PNA; no better.[name]  History of Present Illness: From 10/27/07- Mrs. Cotham continues to smoke.  She recently had a left knee arthroscopy.  In April. she was treated for a URI.  Now she blames spring pollen for scratchy throat.  Cough produces white sputum, but she is not wheezing.  Her ears pop.  She says she can't afford a Proventil inhaler.  She does continue allergy vaccine at 1:50.  We discussed smoking cessation and the cost of cigarettes.  12/21/07- Astma/ COPD, Allergiuc rhinitis Continues allergy vaccine at 1:10- discussed. C/O stuffiness in ears and nose this week without sneeze, sore throat, headache , fever, or wheeze.  04/19/08- asthma/copd, allergic rhinitis Allergy vaccine at 1:10. No major cold/ flu this winter. Just got seasonal flu vax. For past 2 weeks waking head congestion. Throat sore . No fever. Malaise. Blowing green yellow for nose.Eyes feel "sore". No GI.  January 28, 2009- Asthma/ COPD, allergic rhinitis Had felt well until recent visit sick grandchild "scarlet fever"- staying at her house. He's better, but she missed sleep. Nasal congestion x 5 days. Using Nasacort. Out of inhalers- no flares asthma in 2 years and scripts expired. Had fever, chills, swollen glands starting to get better yesterday.  April 09, 2009- Asthma/ COPD, allergic rhinitis, tobacco Scratchy  throat x 1 week, malaise, fever, chills, ache, cough. Had flu vax. Went to UC 3 days ago- told she had right lung pneumonia. Given doxycycline, mucinex, tylenol, hycodan syrup. No CXR done. Now less ache, but tussive sore left anterior chest. Cough productive yellow. Ears pop. Rhinorhea. Stomach upset one day.     Current Medications (verified): 1)  Allegra 180 Mg Tabs (Fexofenadine Hcl) .... Take 1 Tablet By Mouth Once A  Day 2)  Restasis 0.05 %  Emul (Cyclosporine) .[name].. 1gtt Two Times A Day As Needed 3)  Flonase 50 Mcg/act Susp (Fluticasone Propionate) .[name].. 1-2 Puffs Each Nostril Daily 4)  Proair Hfa 108 (90 Base) Mcg/act  Aers (Albuterol Sulfate) .... 2 Inh Q4h As Needed Shortness of Breath 5)  Klor-Con M10 10 Meq Cr-Tabs (Potassium Chloride Crys Cr) .[name].. 1 Once Daily 6)  Ipratropium Bromide 0.06 % Soln (Ipratropium Bromide) .[name].. 1-2 Sprays in Each Nostril Up To Three Times A Day As Needed 7)  Allergy Vaccine Gh .... Advance 1:10 Next Order 8)  Patanol 0.1 % Soln (Olopatadine Hcl) .[name].. 1 Gtt Two Times A Day Each Eye 9)  Hydrochlorothiazide 12.5 Mg  Tabs (Hydrochlorothiazide) .... Take 1 Tab  By Mouth Every Morning 10)  Tramadol Hcl 50 Mg  Tabs (Tramadol Hcl) .[name].. 1-2 By Mouth Two Times A Day As Needed Pain 11)  Omeprazole 20 Mg Cpdr (Omeprazole) .... Take 60mg  Daily  Allergies (verified): 1)  Tylox 2)  Flagyl  Past History:  Past Medical History: Last updated: 01/13/2009 ASTHMA (ICD-493.90) TOBACCO ABUSE (ICD-305.1) DYSFUNCTION, EUSTACHIAN TUBE (ICD-381.81) ALLERGIC RHINITIS (ICD-477.9) TONSILLITIS (ICD-463) INSOMNIA, PERSISTENT (ICD-307.42) PARESTHESIA (ICD-782.0) GERD (ICD-530.81) DIVERTICULOSIS, COLON (ICD-562.10) OVARIAN CYST (ICD-620.2) ALOPECIA (ICD-704.00) IBS Hyperplastic colon polyps Hypertension GI Dr Russella Dar  Past Surgical History: Last updated: 01/10/2009 Tubal ligation left knee arthroscopy Left oophorectomy Rotator cuff surgery  Family History: Last updated: 01/13/2009 Family History Hypertension Family History of Colon Cancer: 2 sisters ages 72 and 1 Family History of Stomach Cancer: brother Family History of Colon Polyps:  sisters, brother Family History of Sclerdermia: Mother Family History of Kidney Disease: oldest brother Family History of Diabetes: 2 brothers, sister, maternal grandfather Family History of Heart Disease: father  Social History: Last updated:  01/13/2009 Occupation: Medical sales representative Single Current Smoker 6 cigs at night Alcohol use-no Drug use-no Regular exercise-no Daily Caffeine Use  Risk Factors: Alcohol Use: 0 (08/26/2008) Exercise: no (01/17/2008)  Risk Factors: Smoking Status: current (08/26/2008) Packs/Day: 0.25 (08/26/2008) Cans of tobacco/wk: no (01/17/2008) Passive Smoke Exposure: no (01/17/2008)  Review of Systems      See HPI       The patient complains of fever, chest pain, dyspnea on exertion, and prolonged cough.  The patient denies anorexia, weight loss, weight gain, vision loss, decreased hearing, hoarseness, syncope, peripheral edema, headaches, hemoptysis, abdominal pain, and severe indigestion/heartburn.    Vital Signs:  Patient profile:   59 year old female Height:      65.5 inches Weight:      178 pounds O2 Sat:      92 % on Room air Pulse rate:   99 / minute BP sitting:   112 / 74  (left arm) Cuff size:   regular  Vitals Entered By: Reynaldo Minium CMA (April 09, 2009 9:13 AM)  O2 Flow:  Room air  Physical Exam  Additional Exam:  General: A/Ox3; pleasant and cooperative, tired appearing SKIN: no rash, lesions NODES: no lymphadenopathy HEENT: [place]/AT, EOM- WNL, Conjuctivae- clear, PERRLA, TM-WNL, Nose- clear, Throat- clear and wnl, [place]  III-IV, hoarse NECK: Supple w/ fair ROM, JVD- none, normal carotid impulses w/o bruits Thyroid- CHEST: Cough, wheeze. No rub or dullness HEART: RRR, no m/g/r heard ABDOMEN: Soft and nl;  ZOX:WRUE, nl pulses, no edema  NEURO: Grossly intact to observation      Impression & Recommendations:  Problem # 1:  COUGH (ICD-786.2)  Flu syndrome despite having had vaccine. She is half-way through doxycycline. Too late for tamiflu. Tussive headache and chest pain.  Plan- neb and depo, finish doxy. CXR.  Problem # 2:  TOBACCO ABUSE (ICD-305.1)  I took the opportunity to emphasize smoking cessation and support- at least try nicotine  patches.  Other Orders: Est. Patient Level III (45409) T-2 View CXR (71020TC) Tobacco use cessation intermediate 3-10 minutes (81191) Admin of Therapeutic Inj  intramuscular or subcutaneous (47829) Depo- Medrol 80mg  (J1040) Nebulizer Tx (56213)  Patient Instructions: 1)  Keep June appointment, but earlier as needed 2)  neb xop 1.25 3)  depo 80 4)  A chest x-ray has been recommended.  Your imaging study may require preauthorization.  5)  Finish doxycycline 6)  Lots of fluids to thin secretions 7)  Please work hard to stop smoking. Nicotine patches are a good way to start.      Medication Administration  Injection # 1:    Medication: Depo- Medrol 80mg     Diagnosis: COUGH (ICD-786.2)    Route: SQ    Site: LUOQ gluteus    Exp Date: 11/2009    Lot #: [id number]    Mfr: Teva    Patient tolerated injection without complications    Given by: Reynaldo Minium CMA (April 09, 2009 10:39 AM)  Medication # 1:    Medication: Xopenex 1.25mg     Diagnosis: COUGH (ICD-786.2)    Dose: 1 vial    Route: inhaled    Exp Date: 10/2009    Lot #: [id number]    Mfr: Sepracor    Patient tolerated medication without complications  Given by: Reynaldo Minium CMA (April 09, 2009 10:40 AM)  Orders Added: 1)  Est. Patient Level III [[address]] 2)  T-2 View CXR [71020TC] 3)  Tobacco use cessation intermediate 3-10 minutes [99406] 4)  Admin of Therapeutic Inj  intramuscular or subcutaneous [96372] 5)  Depo- Medrol 80mg  [J1040] 6)  Nebulizer Tx [60454]

## 2010-03-17 NOTE — Assessment & Plan Note (Signed)
Summary: sore throat and sinus problem-oyu   Vital Signs:  Patient profile:   60 year old female Height:      65.5 inches Weight:      169 pounds BMI:     27.80 O2 Sat:      97 % on Room air Temp:     98.8 degrees F oral Pulse rate:   96 / minute BP sitting:   110 / 74  (left arm) Cuff size:   regular  Vitals Entered By: Bill Salinas CMA (Jul 10, 2009 9:49 AM)  O2 Flow:  Room air CC: Ashley Larsen here with complaint of strep no better, Ashley Larsen was diagnosed sat by Dr Cathey Endow./ Ashley Larsen no longer taking triamterine/ ab   Primary Care Provider:  Tresa Garter MD  CC:  Ashley Larsen here with complaint of strep no better and Ashley Larsen was diagnosed sat by Dr Cathey Endow./ Ashley Larsen no longer taking triamterine/ ab.  History of Present Illness: The patient presents with complaints of sore throat, fever, cough, sinus congestion and drainge of over 7 days duration. Not better with OTC meds. Chest hurts with coughing.  Muscle aches are present.  The mucus is colored. She had a (+) strep test and had a shot of Rocephin on Sat: not better.  Current Medications (verified): 1)  Allegra 180 Mg Tabs (Fexofenadine Hcl) .... Take 1 Tablet By Mouth Once A Day 2)  Restasis 0.05 %  Emul (Cyclosporine) .[name].. 1gtt Two Times A Day As Needed 3)  Flonase 50 Mcg/act Susp (Fluticasone Propionate) .[name].. 1-2 Puffs Each Nostril Daily 4)  Proair Hfa 108 (90 Base) Mcg/act  Aers (Albuterol Sulfate) .... 2 Inh Q4h As Needed Shortness of Breath 5)  Patanol 0.1 % Soln (Olopatadine Hcl) .[name].. 1 Gtt Two Times A Day Each Eye 6)  Triamterene-Hctz 75-50 Mg Tabs (Triamterene-Hctz) .[name].. 1 Daily- Instead of Hctz and Klorcon 7)  Tramadol Hcl 50 Mg  Tabs (Tramadol Hcl) .[name].. 1-2 By Mouth Two Times A Day As Needed Pain 8)  Omeprazole 20 Mg Cpdr (Omeprazole) .... Take 60mg  Daily 9)  Symbicort 160-4.5 Mcg/act Aero (Budesonide-Formoterol Fumarate) .... 2 Inh Two Times A Day 10)  Allergy Vaccine Gh .... Advance 1:10 Next Order 11)  Klor-Con M10 10 Meq Cr-Tabs (Potassium  Chloride Crys Cr) .[name].. 1 By Mouth Qd 12)  Dukes Magic Mouthwash .[name].. 10 Ml By Mouth Swish and Gargle 4 X A Day As Needed For Sore Throat  Allergies (verified): 1)  Tylox 2)  Flagyl  Past History:  Past Medical History: Last updated: 04/18/2009 ASTHMA (ICD-493.90) TOBACCO ABUSE (ICD-305.1) DYSFUNCTION, EUSTACHIAN TUBE (ICD-381.81) ALLERGIC RHINITIS (ICD-477.9) TONSILLITIS (ICD-463) INSOMNIA, PERSISTENT (ICD-307.42) PARESTHESIA (ICD-782.0) GERD (ICD-530.81) DIVERTICULOSIS, COLON (ICD-562.10) OVARIAN CYST (ICD-620.2) ALOPECIA (ICD-704.00) IBS Hyperplastic colon polyps Hypertension GI Dr Russella Dar COPD  Social History: Last updated: 01/13/2009 Occupation: Physicist, 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  The patient denies abdominal pain.    Physical Exam  General:  NAD, alert, well-developed, well-nourished, and well-hydrated.   Ears:  Fluid behind TMs B Mouth:  Erythematous throat mucosa and intranasal erythema.  Lungs:  Normal respiratory effort, chest expands symmetrically. Lungs are clear to auscultation, no crackles or wheezes. Heart:  no murmur and tachycardia.   Abdomen:  Bowel sounds positive,abdomen soft and non-tender without masses, organomegaly or hernias  noted. Skin:  color normal and no rashes.   Psych:  Oriented X3, normally interactive, not anxious appearing, not agitated, and subdued.     Impression & Recommendations:  Problem # 1:  SINUSITIS, ACUTE (ICD-461.9)/URI Assessment Deteriorated  Her updated medication list for this problem includes:    Flonase 50 Mcg/act Susp (Fluticasone propionate) .[name]... 1-2 puffs each nostril daily    Promethazine-codeine 6.25-10 Mg/85ml Syrp (Promethazine-codeine) .[name]... 5-10 ml by mouth q id as needed cough    Augmentin 875-125 Mg  Tabs (Amoxicillin-pot clavulanate) .[name]... 1 by mouth bid    Sudafed 12 Hour 120 Mg Xr12h-tab (Pseudoephedrine hcl) .[name]... 1 by mouth two times a day as needed allergies  Problem # 2:  COPD (ICD-496) Assessment: Deteriorated Stop smoking please Her updated medication list for this problem includes:    Proair Hfa 108 (90 Base) Mcg/act Aers (Albuterol sulfate) .[name]... 2 inh q4h as needed shortness of breath    Symbicort 160-4.5 Mcg/act Aero (Budesonide-formoterol fumarate) .[name]... 2 inh two times a day  Problem # 3:  TOBACCO ABUSE (ICD-305.1) Assessment: Unchanged  Encouraged smoking cessation and discussed different methods for smoking cessation.   Problem # 4:  FATIGUE (ICD-780.79) due to #1 Assessment: Deteriorated  Complete Medication List: 1)  Allegra 180 Mg Tabs (Fexofenadine hcl) .... Take 1 tablet by mouth once a day 2)  Restasis 0.05 % Emul (Cyclosporine) .[name].. 1gtt two times a day as needed 3)  Flonase 50 Mcg/act Susp (Fluticasone propionate) .[name].. 1-2 puffs each nostril daily 4)  Proair Hfa 108 (90 Base) Mcg/act Aers (Albuterol sulfate) .... 2 inh q4h as needed shortness of breath 5)  Patanol 0.1 % Soln (Olopatadine hcl) .[name].. 1 gtt two times a day each eye 6)  Triamterene-hctz 75-50 Mg Tabs (Triamterene-hctz) .[name].. 1 daily- instead of hctz and klorcon 7)  Tramadol Hcl 50 Mg Tabs (Tramadol hcl) .[name].. 1-2 by mouth two times a day as needed pain 8)  Omeprazole 20 Mg Cpdr (Omeprazole) .... Take 60mg  daily 9)  Symbicort 160-4.5 Mcg/act Aero (Budesonide-formoterol fumarate) .... 2 inh two times a day 10)  Allergy Vaccine Gh  .... Advance 1:10 next order 11)  Klor-con M10 10 Meq Cr-tabs (Potassium chloride crys cr) .[name].. 1 by mouth qd 12)  Promethazine-codeine 6.25-10 Mg/63ml Syrp (Promethazine-codeine) .... 5-10 ml by mouth q id as needed cough 13)  Augmentin 875-125 Mg Tabs (Amoxicillin-pot clavulanate) .[name].. 1 by mouth bid 14)  Sudafed 12 Hour 120 Mg Xr12h-tab (Pseudoephedrine hcl) .[name].. 1 by  mouth two times a day as needed allergies  Patient Instructions: 1)  Use over-the-counter medicines for "cold": Tylenol  650mg  or Advil 400mg  every 6 hours  for fever; Delsym or Robutussin for cough. Mucinex or Mucinex D for congestion. Ricola or Halls for sore throat. Office visit if not better or if worse.  Prescriptions: SUDAFED 12 HOUR 120 MG XR12H-TAB (PSEUDOEPHEDRINE HCL) 1 by mouth two times a day as needed allergies  #60 x 1   Entered and Authorized by:   Tresa Garter MD   Signed by:   Tresa Garter MD on 07/10/2009   Method used:   Print then Give to Patient   RxID:   [id number] AUGMENTIN 875-125 MG TABS (AMOXICILLIN-POT CLAVULANATE) 1 by mouth bid  #20 x 0   Entered and Authorized by:   Tresa Garter MD   Signed by:   Tresa Garter MD on 07/10/2009   Method used:   Print then Give to Patient  RxID:   [id number] PROMETHAZINE-CODEINE 6.25-10 MG/5ML SYRP (PROMETHAZINE-CODEINE) 5-10 ml by mouth q id as needed cough  #300 ml x 0   Entered and Authorized by:   Tresa Garter MD   Signed by:   Tresa Garter MD on 07/10/2009   Method used:   Print then Give to Patient   RxID:   [id number]

## 2010-03-17 NOTE — Letter (Signed)
Summary: Office Visit Letter  [facility]  [address] [place], [place] [address]   Phone: [phone]  Fax: [phone]      October 21, 2009 MRN: [id number]   [facility] [address], [place]  [address]   Dear Ms. Harting,   According to our records, it is time for you to schedule a follow-up office visit with [place] in November.   At your convenience, please call [phone] (option #2)to schedule an office visit. If you have any questions, concerns, or feel that this letter is in error, we would appreciate your call.   Sincerely,  Judie Petit T. Russella Dar, M.D.  [facility] [phone]

## 2010-03-17 NOTE — Progress Notes (Signed)
Summary: abx request  Phone Note Refill Request Message from:  Pharmacy on October 24, 2009 11:43 AM  Refills Requested: Medication #1:  Cefuroxime Axetil 500mg  1 twice a day for 14 days   Dosage confirmed as above?Dosage Confirmed   Supply Requested: # 28   Last Refilled: 02/21/2009 Left message on machine for pt to return my call. Did she mean to request an antibiotic or something else? Nicki Guadalajara Fergerson CMA [facility]ull)  October 24, 2009 11:46 AM   Next Appointment Scheduled: none Initial call taken by: Mervin Kung CMA [facility] Dull),  October 24, 2009 11:46 AM  Follow-up for Phone Call        Pt returned my call and stated she has not requested a refill on any medication. Will send rx denial to pharmacy. Nicki Guadalajara Fergerson CMA [facility] Dull)  October 24, 2009 3:05 PM

## 2010-03-17 NOTE — Progress Notes (Signed)
Summary: CT RESULTS  Phone Note From Other Clinic   Summary of Call: CT CALLED: Neg for PE, pt sent home.  Initial call taken by: Lamar Sprinkles, CMA,  April 18, 2009 5:07 PM  Follow-up for Phone Call        CT OK - no blood clot, good news Follow-up by: Tresa Garter MD,  April 18, 2009 5:20 PM  Additional Follow-up for Phone Call Additional follow up Details #1::        left mess to call office back .........................Marland KitchenLamar Sprinkles, CMA  April 18, 2009 5:55 PM     Additional Follow-up for Phone Call Additional follow up Details #2::    Notified the pt Follow-up by: Sydell Axon,  April 21, 2009 9:51 AM

## 2010-03-17 NOTE — Miscellaneous (Signed)
Summary: Injection record/[facility]  Injection record/[facility]   Imported By: Sherian Rein 07/08/2009 11:55:20  _____________________________________________________________________  External Attachment:    Type:   Image     Comment:   External Document

## 2010-03-17 NOTE — Assessment & Plan Note (Signed)
Summary: lower extremity edema/ SD   Vital Signs:  Patient profile:   60 year old female Weight:      181 pounds Temp:     99.1 degrees F oral Pulse rate:   82 / minute BP sitting:   132 / 82  (left arm)  Vitals Entered By: Tora Perches (April 23, 2009 11:08 AM) CC: edema Is Patient Diabetic? No   Primary Care Provider:  Tresa Garter MD  CC:  edema.  History of Present Illness: C/o edema of B legs x 1 wk. F/u SOB, fatigue. URI is better  Current Medications (verified): 1)  Allegra 180 Mg Tabs (Fexofenadine Hcl) .... Take 1 Tablet By Mouth Once A Day 2)  Restasis 0.05 %  Emul (Cyclosporine) .[name].. 1gtt Two Times A Day As Needed 3)  Flonase 50 Mcg/act Susp (Fluticasone Propionate) .[name].. 1-2 Puffs Each Nostril Daily 4)  Proair Hfa 108 (90 Base) Mcg/act  Aers (Albuterol Sulfate) .... 2 Inh Q4h As Needed Shortness of Breath 5)  Patanol 0.1 % Soln (Olopatadine Hcl) .[name].. 1 Gtt Two Times A Day Each Eye 6)  Triamterene-Hctz 75-50 Mg Tabs (Triamterene-Hctz) .[name].. 1 Daily- Instead of Hctz and Klorcon 7)  Tramadol Hcl 50 Mg  Tabs (Tramadol Hcl) .[name].. 1-2 By Mouth Two Times A Day As Needed Pain 8)  Omeprazole 20 Mg Cpdr (Omeprazole) .... Take 60mg  Daily 9)  Allergy Vaccine Gh .... Advance 1:10 Next Order 10)  Symbicort 160-4.5 Mcg/act Aero (Budesonide-Formoterol Fumarate) .... 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    Physical Exam  General:  NAD. Dyspneic after walking Nose:  External nasal examination shows no deformity or inflammation. Nasal mucosa are pink and moist without lesions or exudates. Mouth:  Oral mucosa and oropharynx without lesions or exudates.  Teeth in good repair. Lungs:  Normal respiratory effort, chest expands symmetrically. Lungs are clear to auscultation, no crackles or wheezes. Heart:  RRR Abdomen:  Bowel sounds positive,abdomen soft and non-tender without masses, organomegaly or hernias noted. Extremities:  trace left pedal edema and trace right pedal edema.   Skin:  no rashes, vesicles, ulcers, or erythema. No nodules or irregularity to palpation. not diaphoretic or clammy Psych:  Oriented X3, normally interactive, not anxious appearing, not agitated, and subdued.     Impression & Recommendations:  Problem # 1:  EDEMA (ICD-782.3) Assessment Deteriorated  Her updated medication list for this problem includes:    Triamterene-hctz 75-50 Mg Tabs (Triamterene-hctz) .[name]... 1 daily- instead of hctz and klorcon as per Dr Roxy Cedar rec. Will use Lasix if not better Sick 2/21 -3/14  Orders: Radiology Referral (Radiology) TLB-Udip ONLY (81003-UDIP) TLB-TSH (Thyroid Stimulating Hormone) (84443-TSH) TLB-BMP (Basic Metabolic Panel-BMET) (80048-METABOL)  Problem # 2:  DYSPNEA (ICD-786.05) Assessment: Improved  Problem # 3:  COPD (ICD-496) Assessment: Improved  Her updated medication list for this problem includes:    Proair Hfa 108 (90 Base) Mcg/act Aers (Albuterol  sulfate) .[name]... 2 inh q4h as needed shortness of breath    Symbicort 160-4.5 Mcg/act Aero (Budesonide-formoterol fumarate) .[name]... 2 inh two times a day  Problem # 4:  TOBACCO ABUSE (ICD-305.1) Assessment: Improved Quit x 1 wk  Problem # 5:   HYPOKALEMIA (ICD-276.8) Assessment: New The labs were reviewed with the patient. Start Klor con  Complete Medication List: 1)  Allegra 180 Mg Tabs (Fexofenadine hcl) .... Take 1 tablet by mouth once a day 2)  Restasis 0.05 % Emul (Cyclosporine) .[name].. 1gtt two times a day as needed 3)  Flonase 50 Mcg/act Susp (Fluticasone propionate) .[name].. 1-2 puffs each nostril daily 4)  Proair Hfa 108 (90 Base) Mcg/act Aers (Albuterol sulfate) .... 2 inh q4h as needed shortness of breath 5)  Patanol 0.1 % Soln (Olopatadine hcl) .[name].. 1 gtt two times a day each eye 6)  Triamterene-hctz 75-50 Mg Tabs (Triamterene-hctz) .[name].. 1 daily- instead of hctz and klorcon 7)  Tramadol Hcl 50 Mg Tabs (Tramadol hcl) .[name].. 1-2 by mouth two times a day as needed pain 8)  Omeprazole 20 Mg Cpdr (Omeprazole) .... Take 60mg  daily 9)  Symbicort 160-4.5 Mcg/act Aero (Budesonide-formoterol fumarate) .... 2 inh two times a day 10)  Allergy Vaccine Gh  .... Advance 1:10 next order 11)  Klor-con M10 10 Meq Cr-tabs (Potassium chloride crys cr) .[name].. 1 by mouth qd  Patient Instructions: 1)  Please schedule a follow-up appointment in 3 months. 2)  BMP prior to visit, ICD-9: 3)  TSH prior to visit, ICD-9:782.3 4)  Call if you are not better in 2-3 d or if worse.  Prescriptions: KLOR-CON M10 10 MEQ CR-TABS (POTASSIUM CHLORIDE CRYS CR) 1 by mouth qd  #90 x 3   Entered and Authorized by:   Tresa Garter MD   Signed by:   Tresa Garter MD on 04/24/2009   Method used:   Electronically to        [facility]  [address] [id number]* (retail)       [address]       [place], [place]  [address]       Ph: [phone] or [phone]       Fax: [phone]   RxID:   [id number]

## 2010-03-17 NOTE — Progress Notes (Signed)
Summary: Call Report  Phone Note Other Incoming   Caller: [facility] Call Report Summary of Call: [facility] Triage Call Report Triage Record Num: [id number] Operator: Tomasita Crumble Patient Name: Ashley Larsen Call Date & Time: 07/05/2009 10:46:29AM Patient Phone: [phone] PCP: Patient Gender: Female PCP Fax : Patient DOB: 06-Nov-1950 Practice Name: [facility] Reason for Call: Pt. calling about sore throat, earache, fever 103-104; achy. Onset 5/19. Pain rated at 8 of 10, not relieved w/ Ibuprofen. Advised see in 4 hours per Ear Pain / Injury / Foreign Body protocol w/ home care measuers for the interim and parameters for callback. Transferred to office for appt. Protocol(s) Used: Ear - Pain/Injury/Foreign Body Recommended Outcome per Protocol: See Provider within 4 hours Reason for Outcome: Severe pain (sharp, stabbing, throbbing or excruciating aching) unresponsive to 24 hours of home care Care Advice: A warm washcloth or heating pad set on low to the affected ear may help relieve the discomfort. May apply for 15 to 20 minutes, 3 to 4 times a day.  ~ 05/ Initial call taken by: Margaret Pyle, CMA,  Jul 07, 2009 8:57 AM  Follow-up for Phone Call        OV if needed Follow-up by: Tresa Garter MD,  Jul 07, 2009 9:41 AM  Additional Follow-up for Phone Call Additional follow up Details #1::        pt seen [facility] 07/05/09 Additional Follow-up by: Margaret Pyle, CMA,  Jul 07, 2009 10:59 AM

## 2010-03-17 NOTE — Assessment & Plan Note (Signed)
Summary: DR AVP PT-PRIMCARE LAST MON-COUGH-HOARSE-SWEATS-SHORT/ BREATH...   Vital Signs:  Patient profile:   60 year old female Height:      65.5 inches Weight:      174.12 pounds O2 Sat:      94 % on Room air Temp:     97.2 degrees F oral Pulse rate:   125 / minute  Vitals Entered By: Orlan Leavens (April 15, 2009 2:59 PM)  O2 Flow:  Room air CC: f/u from urgent care still have cough, SOB, and hoarse Is Patient Diabetic? No Pain Assessment Patient in pain? no        Primary Care Provider:  Georgina Quint [name] MD  CC:  f/u from urgent care still have cough, SOB, and and hoarse.  History of Present Illness: here with continued cough and ST - onset approx 10 days ago (last Sat) - seen urgent care 8 days  ago -given doxy + codiene cough med then saw pulm (dr. young) 2/23 for same - CXR done: NAD, told to cont doxy and given depo 80mg  IM now brought here by spouse who left work early because pt "wasn't answering the phone" - spouse reports pt "not making sense" when he went to check on her so made appt - pt c/o sore throat - but unable to elborate - continues to ask for water - pt says +nausea and vomitting last few days - spouse verifies this information - +SOB and DOE but no sputum -  no fever, no CP, no abd pain, no HA, no rash hx then became difficult as pt began to "fidget in chair", writhing motion and stopped answering questions -   Current Medications (verified): 1)  Allegra 180 Mg Tabs (Fexofenadine Hcl) .... Take 1 Tablet By Mouth Once A Day 2)  Restasis 0.05 %  Emul (Cyclosporine) .[name].. 1gtt Two Times A Day As Needed 3)  Flonase 50 Mcg/act Susp (Fluticasone Propionate) .[name].. 1-2 Puffs Each Nostril Daily 4)  Proair Hfa 108 (90 Base) Mcg/act  Aers (Albuterol Sulfate) .... 2 Inh Q4h As Needed Shortness of Breath 5)  Klor-Con M10 10 Meq Cr-Tabs (Potassium Chloride Crys Cr) .[name].. 1 Once Daily 6)  Ipratropium Bromide 0.06 % Soln (Ipratropium Bromide) .[name].. 1-2 Sprays in Each  Nostril Up To Three Times A Day As Needed 7)  Allergy Vaccine Gh .... Advance 1:10 Next Order 8)  Patanol 0.1 % Soln (Olopatadine Hcl) .[name].. 1 Gtt Two Times A Day Each Eye 9)  Hydrochlorothiazide 12.5 Mg  Tabs (Hydrochlorothiazide) .... Take 1 Tab  By Mouth Every Morning 10)  Tramadol Hcl 50 Mg  Tabs (Tramadol Hcl) .[name].. 1-2 By Mouth Two Times A Day As Needed Pain 11)  Omeprazole 20 Mg Cpdr (Omeprazole) .... Take 60mg  Daily 12)  Mucinex 600 Mg Xr12h-Tab (Guaifenesin) .[name].. 1-2 By Mouth Qd 13)  Promethazine-Codeine 6.25-10 Mg/12ml Syrp (Promethazine-Codeine) 14)  Doxycycline .... Take 1 Two Times A Day  Allergies (verified): 1)  Tylox 2)  Flagyl  Past History:  Past medical, surgical, family and social histories (including risk factors) reviewed, and no changes noted (except as noted below).  Past Medical History: Reviewed history from 01/13/2009 and no changes required. ASTHMA (ICD-493.90) TOBACCO ABUSE (ICD-305.1) DYSFUNCTION, EUSTACHIAN TUBE (ICD-381.81) ALLERGIC RHINITIS (ICD-477.9) TONSILLITIS (ICD-463) INSOMNIA, PERSISTENT (ICD-307.42) PARESTHESIA (ICD-782.0) GERD (ICD-530.81) DIVERTICULOSIS, COLON (ICD-562.10) OVARIAN CYST (ICD-620.2) ALOPECIA (ICD-704.00) IBS Hyperplastic colon polyps Hypertension GI Dr Russella Dar  Past Surgical History: Reviewed history from 01/10/2009 and no changes required. Tubal ligation left knee  arthroscopy Left oophorectomy Rotator cuff surgery  Family History: Reviewed history from 01/13/2009 and no changes required. Family History Hypertension Family History of Colon Cancer: 2 sisters ages 71 and 64 Family History of Stomach Cancer: brother Family History of Colon Polyps: sisters, brother Family History of Sclerdermia: Mother Family History of Kidney Disease: oldest brother Family History of Diabetes: 2 brothers, sister, maternal grandfather Family History of Heart Disease: father  Social History: Reviewed history from 01/13/2009 and no  changes required. Occupation: Medical sales representative Single Current Smoker 6 cigs at night Alcohol use-no Drug use-no Regular exercise-no Daily Caffeine Use  Review of Systems       see hpi above - otherwise unable to verify due to pt confusion and mental status changes  Physical Exam  General:  moving nonstop - delerious aggitation - nonsens speech and incomplete senetences - spouse brought in to sit at pt side Eyes:  vision grossly intact; pupils equal, round and reactive to light.  conjunctiva and lids normal.    Ears:  normal pinnae bilaterally, without erythema, swelling, or tenderness to palpation. TMs clear, without effusion, or cerumen impaction. Hearing grossly normal bilaterally  Mouth:  teeth and gums in good repair; mucous membranes moist, without lesions or ulcers. oropharynx clear without exudate, mod erythema.  Neck:  supple, full ROM, no masses, no thyromegaly; no thyroid nodules or tenderness. no JVD or carotid bruits.   Lungs:  increased respiratory effort with shallow air movement, no intercostal retractions but + use of accessory muscles; breath sounds present bilaterally with right base crackles but no wheezes.   no stridor Heart:  tachy - distant - no BLE edema Abdomen:  soft, non-tender, normal bowel sounds, no distention Neurologic:  confused.   Skin:  no rashes, vesicles, ulcers, or erythema. No nodules or irregularity to palpation. not diaphoretic or clammy Psych:  withdrawn, poor eye contact, poor concentration, memory impairment, disoriented to time, and agitated.     Impression & Recommendations:  Problem # 1:  HYPOTENSION (ICD-458.9) unable to get BP today - tachycardia -- in setting of mental status changes, i feel pt needs ER eval for urgent eval to include labs, IVF and HD stablization - EMS called - to transport to [facility] ER for same  Problem # 2:  COUGH (ICD-786.2) CXR 2/23 - NAD - to ER now as above - ?PNA given mild hypoxia and RLL crackles on exam  -  Problem # 3:  ALTERED MENTAL STATUS (ICD-780.97) delerium, progressive during OV today prompting EMS call for transport - ?septic, ?drug effect (codiee cough med), ?hypoperfusion with hypotension-  as EMS arrived, pt became more calm and appropriate, answering questions again... Total time spent with patient and spouse today: 45 minutes  Complete Medication List: 1)  Allegra 180 Mg Tabs (Fexofenadine hcl) .... Take 1 tablet by mouth once a day 2)  Restasis 0.05 % Emul (Cyclosporine) .[name].. 1gtt two times a day as needed 3)  Flonase 50 Mcg/act Susp (Fluticasone propionate) .[name].. 1-2 puffs each nostril daily 4)  Proair Hfa 108 (90 Base) Mcg/act Aers (Albuterol sulfate) .... 2 inh q4h as needed shortness of breath 5)  Klor-con M10 10 Meq Cr-tabs (Potassium chloride crys cr) .[name].. 1 once daily 6)  Ipratropium Bromide 0.06 % Soln (Ipratropium bromide) .[name].. 1-2 sprays in each nostril up to three times a day as needed 7)  Allergy Vaccine Gh  .... Advance 1:10 next order 8)  Patanol 0.1 % Soln (Olopatadine hcl) .[name].. 1 gtt two times  a day each eye 9)  Hydrochlorothiazide 12.5 Mg Tabs (Hydrochlorothiazide) .... Take 1 tab  by mouth every morning 10)  Tramadol Hcl 50 Mg Tabs (Tramadol hcl) .[name].. 1-2 by mouth two times a day as needed pain 11)  Omeprazole 20 Mg Cpdr (Omeprazole) .... Take 60mg  daily 12)  Mucinex 600 Mg Xr12h-tab (Guaifenesin) .[name].. 1-2 by mouth qd 13)  Promethazine-codeine 6.25-10 Mg/24ml Syrp (Promethazine-codeine) 14)  Doxycycline  .... Take 1 two times a day  Patient Instructions: 1)  to ER for further evaluation and treatment - EMS will transport

## 2010-03-17 NOTE — Assessment & Plan Note (Signed)
Summary: FLU SHOT/[facility]   Nurse Visit   Allergies: 1)  Tylox 2)  Flagyl  Orders Added: 1)  Admin 1st Vaccine [90471] 2)  Flu Vaccine 95yrs + [09811] Flu Vaccine Consent Questions     Do you have a history of severe allergic reactions to this vaccine? no    Any prior history of allergic reactions to egg and/or gelatin? no    Do you have a sensitivity to the preservative Thimersol? no    Do you have a past history of Guillan-Barre Syndrome? no    Do you currently have an acute febrile illness? no    Have you ever had a severe reaction to latex? no    Vaccine information given and explained to patient? yes    Are you currently pregnant? no    Lot Number:[id number]   Exp Date:08/15/2010   Site Given  Left Deltoid IM .lbflu1   Tammy Scott  December 17, 2009 9:16 AM

## 2010-03-17 NOTE — Letter (Signed)
Summary: Dermatology/[facility]  Dermatology/[facility]   Imported By: Sherian Rein 08/20/2009 14:27:05  _____________________________________________________________________  External Attachment:    Type:   Image     Comment:   External Document

## 2010-03-17 NOTE — Assessment & Plan Note (Signed)
Summary: DR AVP PT/NO CLINIC  SINUS PROBLEM-SORE THROAT-COUGH--STC   Vital Signs:  Patient profile:   60 year old female Height:      65.5 inches (166.37 cm) Weight:      173 pounds (78.64 kg) O2 Sat:      96 % on Room air Temp:     99.2 degrees F (37.33 degrees C) oral Pulse rate:   85 / minute BP sitting:   132 / 72  (left arm) Cuff size:   regular  Vitals Entered By: Orlan Leavens RMA (December 23, 2009 1:17 PM)  O2 Flow:  Room air CC: Sinus, sore throat, cough, URI symptoms Is Patient Diabetic? No Pain Assessment Patient in pain? no        Primary Care Provider:  Georgina Quint [name] MD  CC:  Sinus, sore throat, cough, and URI symptoms.  History of Present Illness:  URI Symptoms      This is a 60 year old woman who presents with URI symptoms.  The symptoms began 3 days ago.  The severity is described as moderate.  The patient reports nasal congestion, purulent nasal discharge, sore throat, productive cough, and sick contacts, but denies earache.  Associated symptoms include low-grade fever (<100.5 degrees).  The patient denies dyspnea, wheezing, vomiting, and diarrhea.  The patient also reports headache, muscle aches, and severe fatigue.  The patient denies sneezing and seasonal symptoms.    Current Medications (verified): 1)  Allegra 180 Mg Tabs (Fexofenadine Hcl) .... Take 1 Tablet By Mouth Once A Day 2)  Restasis 0.05 %  Emul (Cyclosporine) .[name].. 1gtt Two Times A Day As Needed 3)  Flonase 50 Mcg/act Susp (Fluticasone Propionate) .[name].. 1-2 Puffs Each Nostril Daily 4)  Proair Hfa 108 (90 Base) Mcg/act  Aers (Albuterol Sulfate) .... 2 Inh Q4h As Needed Shortness of Breath 5)  Patanol 0.1 % Soln (Olopatadine Hcl) .[name].. 1 Gtt Two Times A Day Each Eye 6)  Tramadol Hcl 50 Mg  Tabs (Tramadol Hcl) .[name].. 1-2 By Mouth Two Times A Day As Needed Pain 7)  Omeprazole 20 Mg Cpdr (Omeprazole) .... Take 60mg  Daily 8)  Symbicort 160-4.5 Mcg/act Aero (Budesonide-Formoterol Fumarate) .... 2 Inh  Two Times A Day 9)  Allergy Vaccine Gh .... Advance 1:10 Next Order 10)  Klor-Con M10 10 Meq Cr-Tabs (Potassium Chloride Crys Cr) .[name].. 1 By Mouth Qd 11)  Promethazine-Codeine 6.25-10 Mg/71ml Syrp (Promethazine-Codeine) .... 5-10 Ml By Mouth Q Id As Needed Cough 12)  Sudafed 12 Hour 120 Mg Xr12h-Tab (Pseudoephedrine Hcl) .[name].. 1 By Mouth Two Times A Day As Needed Allergies 13)  Hydrochlorothiazide 12.5 Mg Caps (Hydrochlorothiazide) .[name].. 1 By Mouth Once Daily For Blood Pressure  Allergies (verified): 1)  Tylox 2)  Flagyl  Past History:  Past Medical History: ASTHMA (ICD-493.90) DYSFUNCTION, EUSTACHIAN TUBE (ICD-381.81) ALLERGIC RHINITIS (ICD-477.9) TONSILLITIS (ICD-463) INSOMNIA, PERSISTENT (ICD-307.42) PARESTHESIA (ICD-782.0) GERD (ICD-530.81) DIVERTICULOSIS, COLON (ICD-562.10) OVARIAN CYST (ICD-620.2) ALOPECIA (ICD-704.00)   IBS Hyperplastic colon polyps Hypertension GI Dr Russella Dar COPD  Social History: Occupation: Medical sales representative Single  Current Smoker 6 cigs at night Alcohol use-no Drug use-no Regular exercise-no Daily Caffeine Use  Review of Systems       The patient complains of hoarseness.  The patient denies anorexia, decreased hearing, and syncope.    Physical Exam  General:  alert, well-developed, well-nourished, and cooperative to examination.   mod ill appearing Lungs:  Normal respiratory effort, chest expands symmetrically. Lungs with few rhonchi bilat, no crackles or  wheezes. Heart:  normal rate, regular rhythm, no murmur, and no rub. BLE without edema.    Impression & Recommendations:  Problem # 1:  ACUTE BRONCHITIS (ICD-466.0)  depo medrol + abx and cough suppressant - advised to quit smoking The following medications were removed from the medication list:    Zithromax Z-pak 250 Mg Tabs (Azithromycin) .[name]... Take as directed Her updated medication list for this problem includes:    Proair Hfa 108 (90 Base) Mcg/act Aers (Albuterol sulfate) .[name]...  2 inh q4h as needed shortness of breath    Symbicort 160-4.5 Mcg/act Aero (Budesonide-formoterol fumarate) .[name]... 2 inh two times a day    Promethazine-codeine 6.25-10 Mg/72ml Syrp (Promethazine-codeine) .[name]... 5-10 ml by mouth q id as needed cough    Promethazine Vc/codeine 6.25-5-10 Mg/49ml Syrp (Phenyleph-promethazine-cod) .[name]... 1 tsp by mouth every 4 hours as needed for cough    Azithromycin 250 Mg Tabs (Azithromycin) .[name]... 2 tabs by mouth today, then 1 by mouth daily starting tomorrow  Take antibiotics and other medications as directed. Encouraged to push clear liquids, get enough rest, and take acetaminophen as needed. To be seen in 5-7 days if no improvement, sooner if worse.  Orders: Prescription Created Electronically [phone]) Depo- Medrol 80mg  (J1040) Depo- Medrol 40mg  (J1030) Admin of Therapeutic Inj  intramuscular or subcutaneous (29562)  Problem # 2:  TOBACCO ABUSE (ICD-305.1) 5 minutes today spent on patient education regarding the unhealthy effects of continued tobacco abuse and encouragment of cessation including medical options available to help patient to quit smoking.   Complete Medication List: 1)  Allegra 180 Mg Tabs (Fexofenadine hcl) .... Take 1 tablet by mouth once a day 2)  Restasis 0.05 % Emul (Cyclosporine) .[name].. 1gtt two times a day as needed 3)  Flonase 50 Mcg/act Susp (Fluticasone propionate) .[name].. 1-2 puffs each nostril daily 4)  Proair Hfa 108 (90 Base) Mcg/act Aers (Albuterol sulfate) .... 2 inh q4h as needed shortness of breath 5)  Patanol 0.1 % Soln (Olopatadine hcl) .[name].. 1 gtt two times a day each eye 6)  Tramadol Hcl 50 Mg Tabs (Tramadol hcl) .[name].. 1-2 by mouth two times a day as needed pain 7)  Omeprazole 20 Mg Cpdr (Omeprazole) .... Take 60mg  daily 8)  Symbicort 160-4.5 Mcg/act Aero (Budesonide-formoterol fumarate) .... 2 inh two times a day 9)  Allergy Vaccine Gh  .... Advance 1:10 next order 10)  Klor-con M10 10 Meq Cr-tabs (Potassium chloride crys cr) .[name]..  1 by mouth qd 11)  Promethazine-codeine 6.25-10 Mg/75ml Syrp (Promethazine-codeine) .... 5-10 ml by mouth q id as needed cough 12)  Sudafed 12 Hour 120 Mg Xr12h-tab (Pseudoephedrine hcl) .[name].. 1 by mouth two times a day as needed allergies 13)  Hydrochlorothiazide 12.5 Mg Caps (Hydrochlorothiazide) .[name].. 1 by mouth once daily for blood pressure 14)  Promethazine Vc/codeine 6.25-5-10 Mg/53ml Syrp (Phenyleph-promethazine-cod) .[name].. 1 tsp by mouth every 4 hours as needed for cough 15)  Azithromycin 250 Mg Tabs (Azithromycin) .... 2 tabs by mouth today, then 1 by mouth daily starting tomorrow  Patient Instructions: 1)  it was good to see you today. 2)  shot Depo 120 done today 3)  Zpack and cough syrup - Please take as directed. Contact our office if you believe you're having problems with the medication(s).  4)  Get plenty of rest, drink lots of clear liquids, and use Tylenol or Ibuprofen for fever and comfort. Return in 7-10 days if you're not better:sooner if you're feeling worse. Prescriptions: AZITHROMYCIN 250 MG TABS (AZITHROMYCIN)  2 tabs by mouth today, then 1 by mouth daily starting tomorrow  #6 x 0   Entered and Authorized by:   Newt Lukes MD   Signed by:   Newt Lukes MD on 12/23/2009   Method used:   Electronically to        [facility]  [address] # [id number]* (retail)       [address]       [place], [place]  [address]       Ph: [phone] or [phone]       Fax: [phone]   RxID:   [id number] PROMETHAZINE VC/CODEINE 6.25-5-10 MG/5ML SYRP (PHENYLEPH-PROMETHAZINE-COD) 1 tsp by mouth every 4 hours as needed for cough  #200cc x 0   Entered and Authorized by:   Newt Lukes MD   Signed by:   Newt Lukes MD on 12/23/2009   Method used:   Print then Give to Patient   RxID:   [id number]    Medication Administration  Injection # 1:    Medication: Depo- Medrol 80mg     Diagnosis: ACUTE BRONCHITIS (ICD-466.0)    Route: IM     Site: LUOQ gluteus    Exp Date: 06/2012    Lot #: [id number]    Mfr: [employer]    Comments: Gave total 120mg     Patient tolerated injection without complications    Given by: Orlan Leavens RMA (December 23, 2009 1:42 PM)  Injection # 2:    Medication: Depo- Medrol 40mg     Diagnosis: ACUTE BRONCHITIS (ICD-466.0)  Orders Added: 1)  Est. Patient Level IV [56433] 2)  Prescription Created Electronically [G8553] 3)  Depo- Medrol 80mg  [J1040] 4)  Depo- Medrol 40mg  [J1030] 5)  Admin of Therapeutic Inj  intramuscular or subcutaneous [29518]

## 2010-03-17 NOTE — Assessment & Plan Note (Signed)
Summary: strep throat   Vital Signs:  Patient profile:   59 year old female Height:      65.5 inches Weight:      169 pounds BMI:     27.80 O2 Sat:      97 % on Room air Temp:     100.9 degrees F oral Pulse rate:   120 / minute BP sitting:   106 / 80  (left arm) Cuff size:   regular  Vitals Entered By: Payton Spark CMA (Jul 05, 2009 11:51 AM)  O2 Flow:  Room air CC: ST, ear ache and body aches x 3 days   Primary Care Provider:  Tresa Garter MD  CC:  ST and ear ache and body aches x 3 days.  History of Present Illness: 60 yo AAF presents for sore throat, HA and bodyaches x 3 days.  Has run a fever x 2 days.  Taking ibuprofen.  ? exposure to strep.  Has some nausea.  No rash.  No appetite.   Allergies: 1)  Tylox 2)  Flagyl  Past History:  Past Medical History: Reviewed history from 04/18/2009 and no changes required. ASTHMA (ICD-493.90) TOBACCO ABUSE (ICD-305.1) DYSFUNCTION, EUSTACHIAN TUBE (ICD-381.81) ALLERGIC RHINITIS (ICD-477.9) TONSILLITIS (ICD-463) INSOMNIA, PERSISTENT (ICD-307.42) PARESTHESIA (ICD-782.0) GERD (ICD-530.81) DIVERTICULOSIS, COLON (ICD-562.10) OVARIAN CYST (ICD-620.2) ALOPECIA (ICD-704.00) IBS Hyperplastic colon polyps Hypertension GI Dr Russella Dar COPD  Social History: Reviewed history from 01/13/2009 and no changes required. Occupation: Medical sales representative Single Current Smoker 6 cigs at night Alcohol use-no Drug use-no Regular exercise-no Daily Caffeine Use  Review of Systems      See HPI  Physical Exam  General:  alert, well-developed, well-nourished, and well-hydrated.   Head:  normocephalic and atraumatic.   Eyes:  conjunctiva clear Nose:  no nasal discharge.   Mouth:  o/p injected without exudates. 2+ tonsilar hypertrophy.  able to swallow with pain Neck:  shotty tender submandibular LNs Lungs:  Normal respiratory effort, chest expands symmetrically. Lungs are clear to auscultation, no crackles or  wheezes. Heart:  no murmur and tachycardia.   Skin:  color normal and no rashes.     Impression & Recommendations:  Problem # 1:  PHARYNGITIS, STREPTOCOCCAL (ICD-034.0)  Treat with Rocephin 1 gram IM now.  use Advil and Dukes MMW for comfort. Clear fluids, rest.  Call PCP if not improved in 72 hrs.  Orders: Rapid Strep ([address]) Rocephin  250mg  (J8119) Admin of Therapeutic Inj  intramuscular or subcutaneous (14782)  Complete Medication List: 1)  Allegra 180 Mg Tabs (Fexofenadine hcl) .... Take 1 tablet by mouth once a day 2)  Restasis 0.05 % Emul (Cyclosporine) .[name].. 1gtt two times a day as needed 3)  Flonase 50 Mcg/act Susp (Fluticasone propionate) .[name].. 1-2 puffs each nostril daily 4)  Proair Hfa 108 (90 Base) Mcg/act Aers (Albuterol sulfate) .... 2 inh q4h as needed shortness of breath 5)  Patanol 0.1 % Soln (Olopatadine hcl) .[name].. 1 gtt two times a day each eye 6)  Triamterene-hctz 75-50 Mg Tabs (Triamterene-hctz) .[name].. 1 daily- instead of hctz and klorcon 7)  Tramadol Hcl 50 Mg Tabs (Tramadol hcl) .[name].. 1-2 by mouth two times a day as needed pain 8)  Omeprazole 20 Mg Cpdr (Omeprazole) .... Take 60mg  daily 9)  Symbicort 160-4.5 Mcg/act Aero (Budesonide-formoterol fumarate) .... 2 inh two times a day 10)  Allergy Vaccine Gh  .... Advance 1:10 next order 11)  Klor-con M10 10 Meq Cr-tabs (Potassium chloride crys cr) .[name].. 1 by  mouth qd 12)  Dukes Magic Mouthwash  .[name].. 10 ml by mouth swish and gargle 4 x a day as needed for sore throat  Patient Instructions: 1)  Rocephin given today for strep throat. 2)  You are contagious for 48 hrs. 3)  Supportive care with magic mouthwash, advil, popsicles, clear fluids. 4)  Call if not improving in the next 4 days. Prescriptions: DUKES MAGIC MOUTHWASH 10 ml by mouth swish and gargle 4 x a day as needed for sore throat  #150 ml x 0   Entered and Authorized by:   Seymour Bars DO   Signed by:   Seymour Bars DO on 07/05/2009   Method used:   Printed  then faxed to ...       [facility]  [address] # [id number]* (retail)       [address]       [place], [place]  [address]       Ph: [phone] or [phone]       Fax: [phone]   RxID:   [id number]   Laboratory Results    Other Tests  Rapid Strep: positive    Medication Administration  Injection # 1:    Medication: Rocephin  250mg     Diagnosis: PHARYNGITIS, STREPTOCOCCAL (ICD-034.0)    Route: IM    Site: RUOQ gluteus    Exp Date: 05/2011    Lot #: [id number]    Comments: 1 gram     Patient tolerated injection without complications    Given by: Payton Spark CMA (Jul 05, 2009 12:31 PM)  Orders Added: 1)  Rapid Strep [66063] 2)  Est. Patient Level III [01601] 3)  Rocephin  250mg  [J0696] 4)  Admin of Therapeutic Inj  intramuscular or subcutaneous [09323]

## 2010-03-17 NOTE — Assessment & Plan Note (Signed)
Summary: 6 months/apc   Copy to:  Tresa Garter MD Primary Provider/Referring Provider:  Tresa Garter MD  CC:  6 month follow up visit-allergies; sneezing and itching.Ashley Larsen  History of Present Illness: 04/19/08- asthma/copd, allergic rhinitis Allergy vaccine at 1:10. No major cold/ flu this winter. Just got seasonal flu vax. For past 2 weeks waking head congestion. Throat sore . No fever. Malaise. Blowing green yellow for nose.Eyes feel "sore". No GI.  January 28, 2009- Asthma/ COPD, allergic rhinitis Had felt well until recent visit sick grandchild "scarlet fever"- staying at her house. He's better, but she missed sleep. Nasal congestion x 5 days. Using Nasacort. Out of inhalers- no flares asthma in 2 years and scripts expired. Had fever, chills, swollen glands starting to get better yesterday.  April 09, 2009- Asthma/ COPD, allergic rhinitis, tobacco Scratchy  throat x 1 week, malaise, fever, chills, ache, cough. Had flu vax. Went to UC 3 days ago- told she had right lung pneumonia. Given doxycycline, mucinex, tylenol, hycodan syrup. No CXR done. Now less ache, but tussive sore left anterior chest. Cough productive yellow. Ears pop. Rhinorhea. Stomach upset one day.  April 22, 2009- Asthma/ COPD, allergic rhinitis, tobacco Complains of swelling and pain in legs. Dr Posey Rea did CT chest neg for PE or acute cardiopulmnary process. Says she has not had prednisone and has just been eating soup- not much salt. She was given IV potassium infusion March 1st and she blames that for fluid retention. Out of work for 2 weeks.  July 28, 2009- Asthma/ COPd, allergic rhinitis, tobacco Reviewed CT chest from 04/2009- negative for PE or cardiopulmonary. [place] to [place] she had anterior chest wall pains wtih cough. Still smokes a few cigarettes- 4 to 6 daily. Motivated to try again to quit. Nose stays stuffy, wakes with headache and sneezes, but denies much cough or wheeze. Hands have been  itiching and peeling.     Asthma History    Asthma Control Assessment:    Age range: 12+ years    Symptoms: 0-2 days/week    Nighttime Awakenings: 0-2/month    Interferes w/ normal activity: no limitations    SABA use (not for EIB): 0-2 days/week    Asthma Control Assessment: Well Controlled   Preventive Screening-Counseling & Management  Alcohol-Tobacco     Smoking Status: current     Packs/Day: 0.25     Year Started: 1981     Tobacco Counseling: to quit use of tobacco products  Current Medications (verified): 1)  Allegra 180 Mg Tabs (Fexofenadine Hcl) .... Take 1 Tablet By Mouth Once A Day 2)  Restasis 0.05 %  Emul (Cyclosporine) .Ashley Larsen.. 1gtt Two Times A Day As Needed 3)  Flonase 50 Mcg/act Susp (Fluticasone Propionate) .Ashley Larsen.. 1-2 Puffs Each Nostril Daily 4)  Proair Hfa 108 (90 Base) Mcg/act  Aers (Albuterol Sulfate) .... 2 Inh Q4h As Needed Shortness of Breath 5)  Patanol 0.1 % Soln (Olopatadine Hcl) .Ashley Larsen.. 1 Gtt Two Times A Day Each Eye 6)  Tramadol Hcl 50 Mg  Tabs (Tramadol Hcl) .Ashley Larsen.. 1-2 By Mouth Two Times A Day As Needed Pain 7)  Omeprazole 20 Mg Cpdr (Omeprazole) .... Take 60mg  Daily 8)  Symbicort 160-4.5 Mcg/act Aero (Budesonide-Formoterol Fumarate) .... 2 Inh Two Times A Day 9)  Allergy Vaccine Gh .... Advance 1:10 Next Order 10)  Klor-Con M10 10 Meq Cr-Tabs (Potassium Chloride Crys Cr) .Ashley Larsen.. 1 By Mouth Qd 11)  Promethazine-Codeine 6.25-10 Mg/22ml Syrp (Promethazine-Codeine) .... 5-10 Ml By  Mouth Q Id As Needed Cough 12)  Sudafed 12 Hour 120 Mg Xr12h-Tab (Pseudoephedrine Hcl) .Ashley Larsen.. 1 By Mouth Two Times A Day As Needed Allergies  Allergies (verified): 1)  Tylox 2)  Flagyl  Past History:  Past Medical History: Last updated: 04/18/2009 ASTHMA (ICD-493.90) TOBACCO ABUSE (ICD-305.1) DYSFUNCTION, EUSTACHIAN TUBE (ICD-381.81) ALLERGIC RHINITIS (ICD-477.9) TONSILLITIS (ICD-463) INSOMNIA, PERSISTENT (ICD-307.42) PARESTHESIA (ICD-782.0) GERD (ICD-530.81) DIVERTICULOSIS, COLON  (ICD-562.10) OVARIAN CYST (ICD-620.2) ALOPECIA (ICD-704.00) IBS Hyperplastic colon polyps Hypertension GI Dr Russella Dar COPD  Past Surgical History: Last updated: 01/10/2009 Tubal ligation left knee arthroscopy Left oophorectomy Rotator cuff surgery  Family History: Last updated: 01/13/2009 Family History Hypertension Family History of Colon Cancer: 2 sisters ages 29 and 68 Family History of Stomach Cancer: brother Family History of Colon Polyps: sisters, brother Family History of Sclerdermia: Mother Family History of Kidney Disease: oldest brother Family History of Diabetes: 2 brothers, sister, maternal grandfather Family History of Heart Disease: father  Social History: Last updated: 01/13/2009 Occupation: Medical sales representative Single Current Smoker 6 cigs at night Alcohol use-no Drug use-no Regular exercise-no Daily Caffeine Use  Risk Factors: Alcohol Use: 0 (08/26/2008) Exercise: no (01/17/2008)  Risk Factors: Smoking Status: current (07/28/2009) Packs/Day: 0.25 (07/28/2009) Cans of tobacco/wk: no (01/17/2008) Passive Smoke Exposure: no (01/17/2008)  Review of Systems      See HPI       The patient complains of chest pain, headaches, nasal congestion/difficulty breathing through nose, and sneezing.  The patient denies shortness of breath with activity, shortness of breath at rest, productive cough, non-productive cough, coughing up blood, irregular heartbeats, acid heartburn, indigestion, loss of appetite, weight change, abdominal pain, difficulty swallowing, sore throat, and tooth/dental problems.    Vital Signs:  Patient profile:   60 year old female Height:      65.5 inches Weight:      173 pounds BMI:     28.45 O2 Sat:      98 % on Room air Pulse rate:   77 / minute BP sitting:   122 / 82  (left arm) Cuff size:   regular  Vitals Entered By: Reynaldo Minium CMA (July 28, 2009 10:12 AM)  O2 Flow:  Room air CC: 6 month follow up visit-allergies;  sneezing and itching.   Physical Exam  Additional Exam:  General: A/Ox3; pleasant and cooperative, tired appearing, overweight SKIN: no rash, lesions NODES: no lymphadenopathy HEENT: [place]/AT, EOM- WNL, Conjuctivae- clear, PERRLA, TM-WNL, Nose- clear, Throat- clear and wnl, Mallampati  III-IV, hoarse NECK: Supple w/ fair ROM, JVD- none, normal carotid impulses w/o bruits Thyroid- CHEST: . No rub or dullness, no rales or wheeze. 1 dry cough HEART: RRR, no m/g/r heard, no gallop ABDOMEN: Soft and nl;  ZOX:WRUE, nl pulses, no edema today. NEURO: Grossly intact to observation      Impression & Recommendations:  Problem # 1:  COPD (ICD-496) We will try neb and depo to see if we can get her reset. We have discussed chronic vs acute symptoms.  Problem # 2:  EDEMA (ICD-782.3) Resolved with change in diuresis.  Problem # 3:  RHINITIS (ICD-472.0)  Nonspecific, but coincident with poor airquality days. I may be that air polluition is irritating now that pollen season has peaked.  Orders: Est. Patient Level IV (45409)  Patient Instructions: 1)  Please schedule a follow-up appointment in 6 months. 2)  Neb neo nasal 3)  depo 80

## 2010-03-17 NOTE — Miscellaneous (Signed)
Summary: Injection Record / [facility]    Injection Record / [facility]    Imported By: Lennie Odor 10/17/2009 11:14:45  _____________________________________________________________________  External Attachment:    Type:   Image     Comment:   External Document

## 2010-03-17 NOTE — Assessment & Plan Note (Signed)
Summary: STILL GETS SOB WHEN WALKS/LEGS HURT/  NWS   Vital Signs:  Patient profile:   60 year old female Weight:      180 pounds BMI:     29.60 O2 Sat:      97 % on Room air Temp:     97.7 degrees F oral Pulse rate:   127 / minute BP sitting:   100 / 70  Vitals Entered By: Tora Perches (April 18, 2009 10:12 AM)  O2 Flow:  Room air CC: ongoing sob Is Patient Diabetic? No   Primary Care Provider:  Tresa Garter MD  CC:  ongoing sob.  History of Present Illness: The patient presents for a post-hospital ER 3/1  visit for SOB and MS changes, had low K. C/o B leg pain since weekend w/walking - thighs and down. No pain w/rest.    Current Medications (verified): 1)  Allegra 180 Mg Tabs (Fexofenadine Hcl) .... Take 1 Tablet By Mouth Once A Day 2)  Restasis 0.05 %  Emul (Cyclosporine) .[name].. 1gtt Two Times A Day As Needed 3)  Flonase 50 Mcg/act Susp (Fluticasone Propionate) .[name].. 1-2 Puffs Each Nostril Daily 4)  Proair Hfa 108 (90 Base) Mcg/act  Aers (Albuterol Sulfate) .... 2 Inh Q4h As Needed Shortness of Breath 5)  Klor-Con M10 10 Meq Cr-Tabs (Potassium Chloride Crys Cr) .[name].. 1 Once Daily 6)  Ipratropium Bromide 0.06 % Soln (Ipratropium Bromide) .[name].. 1-2 Sprays in Each Nostril Up To Three Times A Day As Needed 7)  Allergy Vaccine Gh .... Advance 1:10 Next Order 8)  Patanol 0.1 % Soln (Olopatadine Hcl) .[name].. 1 Gtt Two Times A Day Each Eye 9)  Hydrochlorothiazide 12.5 Mg  Tabs (Hydrochlorothiazide) .... Take 1 Tab  By Mouth Every Morning 10)  Tramadol Hcl 50 Mg  Tabs (Tramadol Hcl) .[name].. 1-2 By Mouth Two Times A Day As Needed Pain 11)  Omeprazole 20 Mg Cpdr (Omeprazole) .... Take 60mg  Daily 12)  Mucinex 600 Mg Xr12h-Tab (Guaifenesin) .[name].. 1-2 By Mouth Qd 13)  Promethazine-Codeine 6.25-10 Mg/31ml Syrp (Promethazine-Codeine) 14)  Doxycycline .... 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 Family History Hypertension Family History of Colon Cancer: 2 sisters ages 60 and 21 Family History of Stomach Cancer: brother Family History of Colon Polyps: sisters, brother Family History of Sclerdermia: Mother Family History of Kidney Disease: oldest brother Family History of Diabetes: 2 brothers, sister, maternal grandfather Family History of Heart Disease: father  Social History: Reviewed history from 01/13/2009 and no changes required. Occupation: Medical sales representative Single Current Smoker 6 cigs at night Alcohol use-no Drug use-no Regular exercise-no Daily Caffeine Use  Review of Systems       The patient complains of dyspnea on exertion and prolonged cough.  The patient denies fever, hemoptysis, and abdominal pain.         tired  Physical Exam  General:  NAD. Dyspneic after walking Mouth:  Erythematous throat mucosa and intranasal erythema.  Lungs:  CTA B Heart:  RRR Abdomen:  S/NT Msk:  LE NT Extremities:  No edema B Neurologic:  alert & oriented X3, cranial nerves II-XII intact, strength normal in all extremities, sensation intact to light touch, gait normal, and finger-to-nose normal.   Skin:  no rashes, vesicles, ulcers, or erythema. No nodules or irregularity to palpation. not diaphoretic or clammy Psych:  Oriented X3, normally interactive, not anxious appearing, not  agitated, and subdued.     Impression & Recommendations:  Problem # 1:  DYSPNEA (ICD-786.05) r/o PE and other pathology Assessment New Pulse ox 97% w/walking on RA Orders: Radiology Referral (Radiology) CT chest Echo Referral (Echo) Pulmonary Referral (Pulmonary)  Problem # 2:  LEG PAIN (ICD-729.5) B Assessment: New  Orders: Radiology Referral (Radiology) Pulmonary Referral (Pulmonary)  Problem # 3:  COPD (ICD-496) Assessment: Deteriorated  Her updated medication list for this problem includes:    Proair Hfa 108 (90 Base) Mcg/act Aers (Albuterol sulfate) .[name]... 2 inh q4h as needed shortness of breath    Symbicort 160-4.5 Mcg/act Aero (Budesonide-formoterol fumarate) .[name]... 2 inh two times a day  Orders: Pulmonary Referral (Pulmonary)  Problem # 4:  TOBACCO ABUSE (ICD-305.1) Assessment: Unchanged  Encouraged smoking cessation and discussed different methods for smoking cessation.   Complete Medication List: 1)  Allegra 180 Mg Tabs (Fexofenadine hcl) .... Take 1 tablet by mouth once a day 2)  Restasis 0.05 % Emul (Cyclosporine) .[name].. 1gtt two times a day as needed 3)  Flonase 50 Mcg/act Susp (Fluticasone propionate) .[name].. 1-2 puffs each nostril daily 4)  Proair Hfa 108 (90 Base) Mcg/act Aers (Albuterol sulfate) .... 2 inh q4h as needed shortness of breath 5)  Klor-con M10 10 Meq Cr-tabs (Potassium chloride crys cr) .[name].. 1 once daily 6)  Patanol 0.1 % Soln (Olopatadine hcl) .[name].. 1 gtt two times a day each eye 7)  Hydrochlorothiazide 12.5 Mg Tabs (Hydrochlorothiazide) .... Take 1 tab  by mouth every morning 8)  Tramadol Hcl 50 Mg Tabs (Tramadol hcl) .[name].. 1-2 by mouth two times a day as needed pain 9)  Omeprazole 20 Mg Cpdr (Omeprazole) .... Take 60mg  daily 10)  Allergy Vaccine Gh  .... Advance 1:10 next order 11)  Symbicort 160-4.5 Mcg/act Aero (Budesonide-formoterol fumarate) .... 2 inh two times a day  Patient Instructions: 1)  Please schedule a follow-up appointment in 1  week. 2)  Call if you are not better in a reasonable amount of time or if worse. Go to ER if feeling really bad!  Prescriptions: PROAIR HFA 108 (90 BASE) MCG/ACT  AERS (ALBUTEROL SULFATE) 2 inh q4h as needed shortness of breath  #1 x 12   Entered and Authorized by:   Tresa Garter MD   Signed by:   Tresa Garter MD on 04/18/2009   Method used:   Print then Give to Patient   RxID:   [id number] SYMBICORT 160-4.5 MCG/ACT AERO (BUDESONIDE-FORMOTEROL FUMARATE) 2 inh two times a day  #1 x 3   Entered and Authorized by:   Tresa Garter MD   Signed by:   Tresa Garter MD on 04/18/2009   Method used:   Print then Give to Patient   RxID:   [id number]

## 2010-03-17 NOTE — Progress Notes (Signed)
  Phone Note Other Incoming   Caller: pt Summary of Call: pt was just seen by Dr Macario Golds on the 9th of march. She is requesting a medication be called in for a sinus infection. She wants med called into [facility] on [address]. Please Advise Initial call taken by: Ami Bullins CMA,  April 29, 2009 4:07 PM  Follow-up for Phone Call        sorry, this is a new problem, not a call about help with f/u of problem already evaluated.  consider OV if symptoms persist, and consider mucinex OTC Follow-up by: Corwin Levins MD,  April 29, 2009 8:02 PM  Additional Follow-up for Phone Call Additional follow up Details #1::        pt informed Additional Follow-up by: Margaret Pyle, CMA,  April 30, 2009 8:57 AM

## 2010-03-17 NOTE — Assessment & Plan Note (Signed)
Summary: sob/klw   Copy to:  Tresa Garter MD Primary Provider/Referring Provider:  Tresa Garter MD  CC:  SOB and edema since last Wednesday; SOB is better since using Symbicort. [name]  History of Present Illness: 04/19/08- asthma/copd, allergic rhinitis Allergy vaccine at 1:10. No major cold/ flu this winter. Just got seasonal flu vax. For past 2 weeks waking head congestion. Throat sore . No fever. Malaise. Blowing green yellow for nose.Eyes feel "sore". No GI.  January 28, 2009- Asthma/ COPD, allergic rhinitis Had felt well until recent visit sick grandchild "scarlet fever"- staying at her house. He's better, but she missed sleep. Nasal congestion x 5 days. Using Nasacort. Out of inhalers- no flares asthma in 2 years and scripts expired. Had fever, chills, swollen glands starting to get better yesterday.  April 09, 2009- Asthma/ COPD, allergic rhinitis, tobacco Scratchy  throat x 1 week, malaise, fever, chills, ache, cough. Had flu vax. Went to UC 3 days ago- told she had right lung pneumonia. Given doxycycline, mucinex, tylenol, hycodan syrup. No CXR done. Now less ache, but tussive sore left anterior chest. Cough productive yellow. Ears pop. Rhinorhea. Stomach upset one day.  April 22, 2009- Asthma/ COPD, allergic rhinitis, tobacco Complains of swelling and pain in legs. Dr Posey Rea did CT chest neg for PE or acute cardiopulmnary process. Says she has not had prednisone and has just been eating soup- not much salt. She was given IV potassium infusion March 1st and she blames that for fluid retention. Out of work for 2 weeks.  Current Medications (verified): 1)  Allegra 180 Mg Tabs (Fexofenadine Hcl) .... Take 1 Tablet By Mouth Once A Day 2)  Restasis 0.05 %  Emul (Cyclosporine) .[name].. 1gtt Two Times A Day As Needed 3)  Flonase 50 Mcg/act Susp (Fluticasone Propionate) .[name].. 1-2 Puffs Each Nostril Daily 4)  Proair Hfa 108 (90 Base) Mcg/act  Aers (Albuterol Sulfate) .... 2 Inh  Q4h As Needed Shortness of Breath 5)  Klor-Con M10 10 Meq Cr-Tabs (Potassium Chloride Crys Cr) .[name].. 1 Once Daily 6)  Patanol 0.1 % Soln (Olopatadine Hcl) .[name].. 1 Gtt Two Times A Day Each Eye 7)  Hydrochlorothiazide 12.5 Mg  Tabs (Hydrochlorothiazide) .... Take 1 Tab  By Mouth Every Morning 8)  Tramadol Hcl 50 Mg  Tabs (Tramadol Hcl) .[name].. 1-2 By Mouth Two Times A Day As Needed Pain 9)  Omeprazole 20 Mg Cpdr (Omeprazole) .... Take 60mg  Daily 10)  Allergy Vaccine Gh .... Advance 1:10 Next Order 11)  Symbicort 160-4.5 Mcg/act Aero (Budesonide-Formoterol Fumarate) .... 2 Inh Two Times A Day  Allergies (verified): 1)  Tylox 2)  Flagyl  Past History:  Past Medical History: Last updated: 04/18/2009 ASTHMA (ICD-493.90) TOBACCO ABUSE (ICD-305.1) DYSFUNCTION, EUSTACHIAN TUBE (ICD-381.81) ALLERGIC RHINITIS (ICD-477.9) TONSILLITIS (ICD-463) INSOMNIA, PERSISTENT (ICD-307.42) PARESTHESIA (ICD-782.0) GERD (ICD-530.81) DIVERTICULOSIS, COLON (ICD-562.10) OVARIAN CYST (ICD-620.2) ALOPECIA (ICD-704.00) IBS Hyperplastic colon polyps Hypertension GI Dr Russella Dar COPD  Past Surgical History: Last updated: 01/10/2009 Tubal ligation left knee arthroscopy Left oophorectomy Rotator cuff surgery  Family History: Last updated: 01/13/2009 Family History Hypertension Family History of Colon Cancer: 2 sisters ages 15 and 63 Family History of Stomach Cancer: brother Family History of Colon Polyps: sisters, brother Family History of Sclerdermia: Mother Family History of Kidney Disease: oldest brother Family History of Diabetes: 2 brothers, sister, maternal grandfather Family History of Heart Disease: father  Social History: Last updated: 01/13/2009 Occupation: Medical sales representative Single Current Smoker 6 cigs at night Alcohol use-no Drug  use-no Regular exercise-no Daily Caffeine Use  Risk Factors: Alcohol Use: 0 (08/26/2008) Exercise: no (01/17/2008)  Risk Factors: Smoking Status:  current (08/26/2008) Packs/Day: 0.25 (08/26/2008) Cans of tobacco/wk: no (01/17/2008) Passive Smoke Exposure: no (01/17/2008)  Review of Systems      See HPI       The patient complains of weight gain and peripheral edema.  The patient denies anorexia, fever, weight loss, vision loss, decreased hearing, hoarseness, chest pain, syncope, dyspnea on exertion, prolonged cough, headaches, hemoptysis, abdominal pain, and severe indigestion/heartburn.    Vital Signs:  Patient profile:   60 year old female Height:      65.5 inches Weight:      186.13 pounds BMI:     30.61 O2 Sat:      94 % on Room air Pulse rate:   103 / minute BP sitting:   120 / 74  (right arm) Cuff size:   regular  Vitals Entered By: Reynaldo Minium CMA (April 22, 2009 4:30 PM)  O2 Flow:  Room air  Physical Exam  Additional Exam:  General: A/Ox3; pleasant and cooperative, tired appearing, overweight SKIN: no rash, lesions NODES: no lymphadenopathy HEENT: [place]/AT, EOM- WNL, Conjuctivae- clear, PERRLA, TM-WNL, Nose- clear, Throat- clear and wnl, Mallampati  III-IV, hoarse NECK: Supple w/ fair ROM, JVD- none, normal carotid impulses w/o bruits Thyroid- CHEST: Cough, wheeze. No rub or dullness, no rales or wheeze HEART: RRR, no m/g/r heard, no gallop ABDOMEN: Soft and nl;  BJY:NWGN, nl pulses, shiny tight lower legs with pitting, 2-3+ edema NEURO: Grossly intact to observation      Impression & Recommendations:  Problem # 1:  EDEMA (ICD-782.3) This may include some cor pulmonale or peripheral  venous insufficiency. Not in obvious heart failure, but will need the appropriate review of medical status. She has definite edema now, which she relates to paotassium infusion recently. To avoid triggering hypokalemia, I will give her maxzide for trial, but asked her to f/u on this with Dr Posey Rea.  Orders: Est. Patient Level II (56213)  Problem # 2:  ALLERGIC RHINITIS (ICD-477.9) Discussed vaccine management with  [facility] here. Continues vaccine.  Her updated medication list for this problem includes:    Allegra 180 Mg Tabs (Fexofenadine hcl) .[name]... Take 1 tablet by mouth once a day    Flonase 50 Mcg/act Susp (Fluticasone propionate) .[name]... 1-2 puffs each nostril daily  Medications Added to Medication List This Visit: 1)  Triamterene-hctz 75-50 Mg Tabs (Triamterene-hctz) .[name].. 1 daily- instead of hctz and klorcon  Patient Instructions: 1)  Keep June appointment unless needed earlier 2)  Please make an early follow-up appointment with Dr Posey Rea to manage your edema/ fluid retention. 3)  Script was sent to your drug store to temporarily replace your HCTZ and Klor-Con with a stronger diuretic. Prescriptions: TRIAMTERENE-HCTZ 75-50 MG TABS (TRIAMTERENE-HCTZ) 1 daily- instead of HCTZ and Klorcon  #15 x 0   Entered and Authorized by:   Waymon Budge MD   Signed by:   Waymon Budge MD on 04/22/2009   Method used:   Electronically to        [id number] (retail)       [address]       [place], [place]  [address]       Ph: [phone] or [phone]       Fax: [phone]   RxID:   [id number]

## 2010-03-17 NOTE — Progress Notes (Signed)
Summary: RF HCTZ-Not on med list  Phone Note Refill Request Message from:  Pharmacy  Rf request for HCTZ 12.5 mg 1 once daily # 30 w/ 12 RF.  This med is not on active med list.  Please advise   Method Requested: Electronic Next Appointment Scheduled: NONE Initial call taken by: Lanier Prude, [facility]),  September 12, 2009 8:35 AM  Follow-up for Phone Call        ok  Follow-up by: Tresa Garter MD,  September 12, 2009 1:17 PM    New/Updated Medications: HYDROCHLOROTHIAZIDE 12.5 MG CAPS (HYDROCHLOROTHIAZIDE) 1 by mouth once daily for blood pressure Prescriptions: HYDROCHLOROTHIAZIDE 12.5 MG CAPS (HYDROCHLOROTHIAZIDE) 1 by mouth once daily for blood pressure  #30 x 12   Entered and Authorized by:   Tresa Garter MD   Signed by:   Tresa Garter MD on 09/12/2009   Method used:   Electronically to        [id number]* (retail)       [address]       [place], [place]  [address]       Ph: [phone] or [phone]       Fax: [phone]   RxID:   [id number]

## 2010-03-17 NOTE — Assessment & Plan Note (Signed)
Summary: flu shot/apc   Nurse Visit   Allergies: 1)  Tylox 2)  Flagyl  Orders Added: 1)  Admin 1st Vaccine [90471] 2)  Flu Vaccine 56yrs + [32440]  Flu Vaccine Consent Questions     Do you have a history of severe allergic reactions to this vaccine? no    Any prior history of allergic reactions to egg and/or gelatin? no    Do you have a sensitivity to the preservative Thimersol? no    Do you have a past history of Guillan-Barre Syndrome? no    Do you currently have an acute febrile illness? no    Have you ever had a severe reaction to latex? no    Vaccine information given and explained to patient? yes    Are you currently pregnant? no    Lot Number:[id number]   Exp Date:08/14/2009   Site Given  Left Deltoid IM Tammy Scott  February 26, 2009 5:16 PM

## 2010-03-17 NOTE — Miscellaneous (Signed)
Summary: Injection Record/[facility]  Injection Record/[facility]   Imported By: Sherian Rein 07/08/2009 12:09:43  _____________________________________________________________________  External Attachment:    Type:   Image     Comment:   External Document

## 2010-03-17 NOTE — Progress Notes (Signed)
Summary: sinus infection- tx with zpack- CALL AFTER 2PM  Phone Note Call from Patient Call back at [phone]   Caller: Patient Call For: YOUNG Summary of Call: SINUS INFECTION [facility] [address] Initial call taken by: Rickard Patience,  August 11, 2009 9:52 AM  Follow-up for Phone Call        SPoke with pt.  Pt c/o sinus pressure, HA, nasal congestion with yellow mucus, and prod cough with yellow mucus since yesterday.   Chill last night but denies fever.  Requesting abx.   [facility] [address] Allergies: Tylox, Flaygl Will forward message to CY-pls advise.  Thanks! Follow-up by: Gweneth Dimitri RN,  August 11, 2009 10:02 AM  Additional Follow-up for Phone Call Additional follow up Details #1::        Offer z pak Additional Follow-up by: Waymon Budge MD,  August 11, 2009 1:11 PM    Additional Follow-up for Phone Call Additional follow up Details #2::    Zpack was sent to pharm.  Called pt's work number and was advised that she is at lunch and to try calling back after 2 pm.  Will try back then. Vernie Murders  August 11, 2009 1:29 PM  called pt's work number and was advise she "must be at lunch"  Will try back later Gweneth Dimitri RN  August 11, 2009 2:13 PM   Spoke with pt and advised that rx for zpack has been sent. Vernie Murders  August 11, 2009 2:28 PM   New/Updated Medications: ZITHROMAX Z-PAK 250 MG TABS (AZITHROMYCIN) take as directed Prescriptions: ZITHROMAX Z-PAK 250 MG TABS (AZITHROMYCIN) take as directed  #1 x 0   Entered by:   Vernie Murders   Authorized by:   Waymon Budge MD   Signed by:   Vernie Murders on 08/11/2009   Method used:   Electronically to        [facility]  [address] [id number] (retail)       [address]       [place], [place]  [address]       Ph: [phone] or [phone]       Fax: [phone]   RxID:   [id number]

## 2010-03-25 NOTE — Letter (Signed)
Summary: Beather Arbour MD  Beather Arbour MD   Imported By: Sherian Rein 03/19/2010 11:42:47  _____________________________________________________________________  External Attachment:    Type:   Image     Comment:   External Document

## 2010-04-02 ENCOUNTER — Ambulatory Visit (INDEPENDENT_AMBULATORY_CARE_PROVIDER_SITE_OTHER): Payer: BC Managed Care – PPO

## 2010-04-02 DIAGNOSIS — J301 Allergic rhinitis due to pollen: Secondary | ICD-10-CM

## 2010-04-08 ENCOUNTER — Encounter: Payer: Self-pay | Admitting: Internal Medicine

## 2010-04-08 ENCOUNTER — Ambulatory Visit (INDEPENDENT_AMBULATORY_CARE_PROVIDER_SITE_OTHER): Payer: BC Managed Care – PPO

## 2010-04-08 ENCOUNTER — Ambulatory Visit (INDEPENDENT_AMBULATORY_CARE_PROVIDER_SITE_OTHER): Payer: BC Managed Care – PPO | Admitting: Internal Medicine

## 2010-04-08 DIAGNOSIS — K219 Gastro-esophageal reflux disease without esophagitis: Secondary | ICD-10-CM

## 2010-04-08 DIAGNOSIS — J301 Allergic rhinitis due to pollen: Secondary | ICD-10-CM

## 2010-04-08 DIAGNOSIS — J019 Acute sinusitis, unspecified: Secondary | ICD-10-CM

## 2010-04-08 NOTE — Miscellaneous (Signed)
Summary: Injection Financial risk analyst   Imported By: Sherian Rein 04/02/2010 08:52:54  _____________________________________________________________________  External Attachment:    Type:   Image     Comment:   External Document

## 2010-04-14 NOTE — Assessment & Plan Note (Signed)
Summary: SORE THROAT / SINUS /NWS  #   Vital Signs:  Patient profile:   60 year old female Height:      65.5 inches Weight:      173.50 pounds BMI:     28.54 O2 Sat:      96 % on Room air Temp:     98.7 degrees F oral Pulse rate:   86 / minute BP sitting:   132 / 80  (left arm) Cuff size:   regular  Vitals Entered By: Margaret Pyle, CMA (April 08, 2010 3:48 PM)  O2 Flow:  Room air CC: ST, congestion, sinus pressure, earache and prod cough x 4 days, URI symptoms   Primary Care Provider:  Tresa Garter MD  CC:  ST, congestion, sinus pressure, earache and prod cough x 4 days, and URI symptoms.  History of Present Illness:  URI Symptoms      This is a 60 year old woman who presents with URI symptoms.  The symptoms began 4 days ago.  The severity is described as moderate.  The patient reports nasal congestion, purulent nasal discharge, sore throat, productive cough, and earache, but denies sick contacts.  Associated symptoms include low-grade fever (<100.5 degrees).  The patient denies dyspnea, wheezing, rash, vomiting, diarrhea, and use of an antipyretic.  The patient also reports itchy watery eyes, itchy throat, headache, and severe fatigue.  The patient denies sneezing and muscle aches.  The patient denies the following risk factors for Strep sinusitis: unilateral facial pain, unilateral nasal discharge, double sickening, tooth pain, and Strep exposure.    Allergies: 1)  Tylox 2)  Flagyl  Past History:  Past Medical History: ASTHMA (ICD-493.90) DYSFUNCTION, EUSTACHIAN TUBE (ICD-381.81)  ALLERGIC RHINITIS (ICD-477.9) TONSILLITIS (ICD-463) INSOMNIA, PERSISTENT (ICD-307.42) PARESTHESIA (ICD-782.0) GERD (ICD-530.81) DIVERTICULOSIS, COLON (ICD-562.10) OVARIAN CYST (ICD-620.2) ALOPECIA (ICD-704.00)   IBS Hyperplastic colon polyps Hypertension GI Dr Russella Dar COPD  Review of Systems  The patient denies vision loss, decreased hearing, hoarseness, chest pain,  and hemoptysis.    Physical Exam  General:  alert, well-developed, well-nourished, and cooperative to examination.   mildly ill appearing Head:  normocephalic and atraumatic.   Eyes:  conjunctiva clear Ears:  normal pinnae bilaterally, without erythema, swelling, or tenderness to palpation. TMs hazy but not red, without effusion, or cerumen impaction. Hearing grossly normal bilaterally  Nose:  no nasal discharge.  tender over maxillary region B Mouth:  teeth and gums in good repair; mucous membranes moist, without lesions or ulcers. oropharynx clear without exudate, mild-mod erythema.  Lungs:  Normal respiratory effort, chest expands symmetrically. clear BS bilat, no crackles or wheezes. Heart:  normal rate, regular rhythm, no murmur, and no rub. BLE without edema.    Impression & Recommendations:  Problem # 1:  SINUSITIS, ACUTE (ICD-461.9)  The following medications were removed from the medication list:    Promethazine-codeine 6.25-10 Mg/56ml Syrp (Promethazine-codeine) .[name]... 5-10 ml by mouth q id as needed cough Her updated medication list for this problem includes:    Flonase 50 Mcg/act Susp (Fluticasone propionate) .[name]... 1-2 puffs each nostril daily    Sudafed 12 Hour 120 Mg Xr12h-tab (Pseudoephedrine hcl) .[name]... 1 by mouth two times a day as needed allergies    Promethazine Vc/codeine 6.25-5-10 Mg/40ml Syrp (Phenyleph-promethazine-cod) .[name]... 1 tsp by mouth every 4 hours as needed for cough    Azithromycin 250 Mg Tabs (Azithromycin) .[name]... 2 tabs by mouth today, then 1 by mouth daily starting tomorrow  Instructed on treatment. Call if symptoms  persist or worsen.   Problem # 2:  GERD (ICD-530.81) refill today - needs to f/u pcp Her updated medication list for this problem includes:    Omeprazole 40 Mg Cpdr (Omeprazole) ..... One tablet by mouth once daily  Labs Reviewed: Hgb: 12.7 (08/26/2008)   Hct: 36.6 (08/26/2008)  Complete Medication List: 1)  Allegra 180 Mg Tabs  (Fexofenadine hcl) .... Take 1 tablet by mouth once a day 2)  Restasis 0.05 % Emul (Cyclosporine) .[name].. 1gtt two times a day as needed 3)  Flonase 50 Mcg/act Susp (Fluticasone propionate) .[name].. 1-2 puffs each nostril daily 4)  Proair Hfa 108 (90 Base) Mcg/act Aers (Albuterol sulfate) .... 2 inh q4h as needed shortness of breath 5)  Patanol 0.1 % Soln (Olopatadine hcl) .[name].. 1 gtt two times a day each eye 6)  Tramadol Hcl 50 Mg Tabs (Tramadol hcl) .[name].. 1-2 by mouth two times a day as needed pain 7)  Omeprazole 40 Mg Cpdr (Omeprazole) .... One tablet by mouth once daily 8)  Symbicort 160-4.5 Mcg/act Aero (Budesonide-formoterol fumarate) .... 2 inh two times a day 9)  Allergy Vaccine Gh  .... Advance 1:10 next order 10)  Klor-con M10 10 Meq Cr-tabs (Potassium chloride crys cr) .[name].. 1 by mouth qd 11)  Sudafed 12 Hour 120 Mg Xr12h-tab (Pseudoephedrine hcl) .[name].. 1 by mouth two times a day as needed allergies 12)  Hydrochlorothiazide 12.5 Mg Caps (Hydrochlorothiazide) .[name].. 1 by mouth once daily for blood pressure 13)  Promethazine Vc/codeine 6.25-5-10 Mg/55ml Syrp (Phenyleph-promethazine-cod) .[name].. 1 tsp by mouth every 4 hours as needed for cough 14)  Azithromycin 250 Mg Tabs (Azithromycin) .... 2 tabs by mouth today, then 1 by mouth daily starting tomorrow  Patient Instructions: 1)  it was good to see you today.  2)  Zpak and cough syup + restart nose spray and reflux medications - your prescriptions have been given to you to submit to your pharmacy. Please take as directed. Contact our office if you believe you're having problems with the medication(s).  3)  Get plenty of rest, drink lots of clear liquids, and use Tylenol or Ibuprofen for fever and comfort. Return in 7-10 days if you're not better:sooner if you're feeling worse. 4)  Please schedule a follow-up appointment with Dr. Posey Rea in next 6-12 weeks, call sooner if problems.  Prescriptions: FLONASE 50 MCG/ACT SUSP (FLUTICASONE PROPIONATE) 1-2  puffs each nostril daily  #1 x 1   Entered and Authorized by:   Newt Lukes MD   Signed by:   Newt Lukes MD on 04/08/2010   Method used:   Print then Give to Patient   RxID:   [id number] AZITHROMYCIN 250 MG TABS (AZITHROMYCIN) 2 tabs by mouth today, then 1 by mouth daily starting tomorrow  #6 x 0   Entered and Authorized by:   Newt Lukes MD   Signed by:   Newt Lukes MD on 04/08/2010   Method used:   Print then Give to Patient   RxID:   [id number] PROMETHAZINE VC/CODEINE 6.25-5-10 MG/5ML SYRP (PHENYLEPH-PROMETHAZINE-COD) 1 tsp by mouth every 4 hours as needed for cough  #6oz x 0   Entered and Authorized by:   Newt Lukes MD   Signed by:   Newt Lukes MD on 04/08/2010   Method used:   Print then Give to Patient   RxID:   [id number] OMEPRAZOLE 40 MG CPDR (OMEPRAZOLE) one tablet by mouth once daily  #30 x 1   Entered  and Authorized by:   Newt Lukes MD   Signed by:   Newt Lukes MD on 04/08/2010   Method used:   Print then Give to Patient   RxID:   [id number]    Orders Added: 1)  Est. Patient Level IV [78469]

## 2010-05-08 ENCOUNTER — Ambulatory Visit (INDEPENDENT_AMBULATORY_CARE_PROVIDER_SITE_OTHER): Payer: BC Managed Care – PPO

## 2010-05-08 DIAGNOSIS — J301 Allergic rhinitis due to pollen: Secondary | ICD-10-CM

## 2010-05-08 LAB — URINALYSIS, ROUTINE W REFLEX MICROSCOPIC
Bilirubin Urine: NEGATIVE
Hgb urine dipstick: NEGATIVE
Protein, ur: NEGATIVE mg/dL
Urobilinogen, UA: 0.2 mg/dL (ref 0.0–1.0)

## 2010-05-08 LAB — CBC
MCHC: 34.6 g/dL (ref 30.0–36.0)
MCV: 89 fL (ref 78.0–100.0)
Platelets: 319 10*3/uL (ref 150–400)
RDW: 13.9 % (ref 11.5–15.5)

## 2010-05-08 LAB — POCT I-STAT, CHEM 8
Glucose, Bld: 106 mg/dL — ABNORMAL HIGH (ref 70–99)
HCT: 53 % — ABNORMAL HIGH (ref 36.0–46.0)
Hemoglobin: 18 g/dL — ABNORMAL HIGH (ref 12.0–15.0)
Potassium: 3.3 mEq/L — ABNORMAL LOW (ref 3.5–5.1)
Sodium: 139 mEq/L (ref 135–145)

## 2010-05-08 LAB — DIFFERENTIAL
Basophils Relative: 0 % (ref 0–1)
Eosinophils Absolute: 0.1 10*3/uL (ref 0.0–0.7)
Neutrophils Relative %: 66 % (ref 43–77)

## 2010-05-11 DIAGNOSIS — J301 Allergic rhinitis due to pollen: Secondary | ICD-10-CM

## 2010-05-15 ENCOUNTER — Ambulatory Visit (INDEPENDENT_AMBULATORY_CARE_PROVIDER_SITE_OTHER): Payer: BC Managed Care – PPO

## 2010-05-15 DIAGNOSIS — J301 Allergic rhinitis due to pollen: Secondary | ICD-10-CM

## 2010-05-21 ENCOUNTER — Ambulatory Visit (INDEPENDENT_AMBULATORY_CARE_PROVIDER_SITE_OTHER): Payer: BC Managed Care – PPO

## 2010-05-21 DIAGNOSIS — J301 Allergic rhinitis due to pollen: Secondary | ICD-10-CM

## 2010-05-28 ENCOUNTER — Other Ambulatory Visit: Payer: Self-pay | Admitting: Gastroenterology

## 2010-05-29 NOTE — Telephone Encounter (Signed)
Left another message explaining to pt that we cannot give her any more refills until she comes for an appt.

## 2010-05-31 ENCOUNTER — Other Ambulatory Visit: Payer: Self-pay | Admitting: Internal Medicine

## 2010-06-01 ENCOUNTER — Ambulatory Visit (INDEPENDENT_AMBULATORY_CARE_PROVIDER_SITE_OTHER): Payer: BC Managed Care – PPO

## 2010-06-01 DIAGNOSIS — J309 Allergic rhinitis, unspecified: Secondary | ICD-10-CM

## 2010-06-01 NOTE — Telephone Encounter (Signed)
Left another message x3

## 2010-06-05 ENCOUNTER — Telehealth: Payer: Self-pay | Admitting: Internal Medicine

## 2010-06-05 MED ORDER — PROMETHAZINE-CODEINE 6.25-10 MG/5ML PO SYRP: ORAL_SOLUTION | ORAL | Status: DC

## 2010-06-05 NOTE — Telephone Encounter (Signed)
Spoke w/ pt and she c/o cough w/ yellow phlem, nasal drainage, losing her voice x 2 weeks. Pt states she went to urgent care last week and they dx her w/ a sinus infection and was given 7 days of amoxicillin. Pt states she feels like she is getting worse. Pt has been taking Robitussin at night but has had no relief. Please advise recs for pt Dr. Maple Hudson. Thanks  Allergies  Allergen Reactions  . Metronidazole     REACTION: upset stomach  . Oxycodone-Acetaminophen     REACTION: ?? long time ago    [employer], CMA

## 2010-06-05 NOTE — Telephone Encounter (Signed)
Per CDY: okay for phenergan codeine syrup #235mL, 1 teaspoon every 6 hours as needed cough.   Called spoke with patient, advised of CDY's recs.  Pt okay with this and verbalized her understanding.  rx telephoned to pt's verified pharmacy.

## 2010-06-05 NOTE — Telephone Encounter (Signed)
Called spoke with patient to offer work-in slot per CDY.  Pt stated that she really called in to get something to help her cough > it's "really bad" at night and the robitussin is not helping.  Per CDY: rec Netti-pot and avelox x10days.  Pt states that she will do what CDY thinks is best, but is most concerned about her cough.  Will forward to CDY for recs.

## 2010-06-11 ENCOUNTER — Ambulatory Visit (INDEPENDENT_AMBULATORY_CARE_PROVIDER_SITE_OTHER): Payer: BC Managed Care – PPO

## 2010-06-11 DIAGNOSIS — J309 Allergic rhinitis, unspecified: Secondary | ICD-10-CM

## 2010-06-22 ENCOUNTER — Ambulatory Visit (INDEPENDENT_AMBULATORY_CARE_PROVIDER_SITE_OTHER): Payer: BC Managed Care – PPO

## 2010-06-22 ENCOUNTER — Encounter: Payer: Self-pay | Admitting: Gastroenterology

## 2010-06-22 ENCOUNTER — Ambulatory Visit (INDEPENDENT_AMBULATORY_CARE_PROVIDER_SITE_OTHER): Payer: BC Managed Care – PPO | Admitting: Gastroenterology

## 2010-06-22 VITALS — BP 136/80 | HR 64 | Ht 65.0 in | Wt 178.0 lb

## 2010-06-22 DIAGNOSIS — Z800 Family history of malignant neoplasm of digestive organs: Secondary | ICD-10-CM

## 2010-06-22 DIAGNOSIS — K219 Gastro-esophageal reflux disease without esophagitis: Secondary | ICD-10-CM

## 2010-06-22 DIAGNOSIS — J309 Allergic rhinitis, unspecified: Secondary | ICD-10-CM

## 2010-06-22 MED ORDER — OMEPRAZOLE 40 MG PO CPDR: 40.0000 mg | DELAYED_RELEASE_CAPSULE | Freq: Two times a day (BID) | ORAL | Status: DC

## 2010-06-22 NOTE — Patient Instructions (Signed)
Your prescription has been sent to your pharmacy 

## 2010-06-22 NOTE — Progress Notes (Signed)
History of Present Illness: This is a  60 year old female complaining of worsening problems with belching and reflux symptoms particularly after going to bed in the evening. Her symptoms have been active for the past several weeks. She has remained on omeprazole 40 mg daily. She was placed on meloxicam several weeks ago and noted worsening problems with epigastric pain so she discontinued the medication. Her reflux symptoms have persisted. Upper endoscopy was performed in1999 showing mild esophagitis and mild gastritis.  Current Medications, Allergies, Past Medical History, Past Surgical History, Family History and Social History were reviewed in [employer] record.  Physical Exam: General: Well developed , well nourished, no acute distress Head: Normocephalic and atraumatic Eyes:  sclerae anicteric, EOMI Ears: Normal auditory acuity Mouth: No deformity or lesions Lungs: Clear throughout to auscultation Heart: Regular rate and rhythm; no murmurs, rubs or bruits Abdomen: Soft, non tender and non distended. No masses, hepatosplenomegaly or hernias noted. Normal Bowel sounds Musculoskeletal: Symmetrical with no gross deformities  Pulses:  Normal pulses noted Extremities: No clubbing, cyanosis, edema or deformities noted Neurological: Alert oriented x 4, grossly nonfocal Psychological:  Alert and cooperative. Normal mood and affect  Assessment and Recommendations:  1. GERD. Follow all reflux measures. Avoid eating any food or drinking 3-4 hours before bedtime. Increase omeprazole to 40 mg twice a day taken 30 minutes before meals. If this is ineffective consider addng an H2 RA at bedtime and proceeding with upper endoscopy for further evaluation. Rule out meloxicam gastritis or ulcer. Continue to avoid meloxicam.  2. Family history of colon cancer in 2 sisters ages 69 and 39 respectively. Screening colonoscopy is due July 2012.

## 2010-06-30 NOTE — Assessment & Plan Note (Signed)
[facility]                         GASTROENTEROLOGY OFFICE NOTE   Ashley Larsen, Ashley Larsen                     MRN:          [id number]  DATE:03/22/2007                            DOB:          01-19-1951    This is a return office visit for lower abdominal pain.  She was treated  empirically for diverticulitis.  A CT scan of the abdomen and pelvis  dated March 06, 2007 showed left-colon diverticulosis without  diverticulitis, and the patient has a history of a prior left  oophorectomy.  No other abnormalities were noted.  She states she has  generally felt well since she completed her second course of  antibiotics.  She has had occasional episodes of right lower quadrant  pain with mild post prandial diarrhea.   CURRENT MEDICATIONS:  Listed on the chart, updated and reviewed.   MEDICATION ALLERGIES:  None known.   PHYSICAL EXAMINATION:  No acute distress.  Weight 183.4 pounds, blood pressure is 128/82, pulse 76 and regular.  ABDOMEN:  Soft, nondistended, nontender, normoactive bowel sounds, no  palpable organomegaly, masses, or hernias.   ASSESSMENT AND PLAN:  1. Diverticulosis and probable irritable bowel syndrome.  Maintain a      long-term high-fiber diet with adequate fluid intake.  Continue      Robinul Forte 1 b.i.d. p.r.n.  If her gastrointestinal symptoms do      not respond well to this regimen, she will return for followup.  2. Gastroesophageal reflux disease.  Symptoms under control.  Continue      omeprazole 40 mg p.o. q.a.m. along with antireflux measures.      Return office visit in 1 year.  3. Personal history of adenomatous colon polyps and 2 siblings with      colon cancer.  Surveillance colonoscopy recommended in July 2012.     Venita Lick. Russella Dar, MD, [facility]  Electronically Signed    MTS/MedQ  DD: 03/22/2007  DT: 03/23/2007  Job #: [id number]

## 2010-06-30 NOTE — Assessment & Plan Note (Signed)
[facility]                         GASTROENTEROLOGY OFFICE NOTE   XIA, STOHR                     MRN:          [id number]  DATE:02/28/2007                            DOB:          Jul 10, 1950    Mrs. Melka complains of worsening lower abdominal pain over the past  week.  She was seen in the emergency department on January 10, 2007  with lower abdominal pain and hematochezia.  A multiview abdominal  series was negative.  CBC was normal.  She was treated presumptively for  diverticulitis with Cipro and Flagyl, and all of her symptoms resolved  over the next several days.  She felt well until about one week ago when  her abdominal pain returned.  She notes it was initially worse in the  left lower quadrant but recently has been bothersome in the right lower  quadrant.  She has had no bleeding, change in bowel habits, diarrhea,  constipation, fevers, chills, nausea or vomiting.  She is maintained on  Prevacid for management of GERD and her symptoms have appeared to be  under good control.  However, she would like to change medications due  to her high copay cost with Prevacid.   CURRENT MEDICATIONS:  Listed on the chart-updated and reviewed.   MEDICATION ALLERGIES:  None known.   PHYSICAL EXAMINATION:  No acute distress.  Weight:  185.2 pounds.  Blood  pressure:  130/90.  Pulse:  72, regular.  CHEST:  Clear to auscultation bilaterally.  CARDIAC:  Regular rate and rhythm without murmurs appreciated.  ABDOMEN:  Soft with left lower quadrant greater than suprapubic and  right lower quadrant tenderness to deep palpation.  No rebound or  guarding.  No palpable organomegaly, masses, or hernias.  Normal, active  bowel sounds.  RECTAL:  Deferred with an exam performed in the emergency room in late  November that was unremarkable.   ASSESSMENT/PLAN:  1. Possible recurrent diverticulitis. Obtain a CBC, C-MET and lipase      today.  Schedule a  CT scan of the abdomen and pelvis.  Begin Cipro      500 mg b.i.d. and Flagyl 500 mg b.i.d. for ten days.  Begin Robinul      Forte 1/2-1 whole tablet b.i.d. as needed.  She will contact us if      her symptoms worsen.  Return office visit two to three weeks.  2. Gastroesophageal reflux disease.  Change to omeprazole 40 mg p.o.      q.a.m. taken 30 minutes before breakfast.  Standard antireflux      measures.  Return office visit two to three weeks.     Venita Lick. Russella Dar, MD, [facility]  Electronically Signed    MTS/MedQ  DD: 02/28/2007  DT: 02/28/2007  Job #: [id number]

## 2010-06-30 NOTE — Assessment & Plan Note (Signed)
[facility]                             PULMONARY OFFICE NOTE   HELI, DINO                     MRN:          [id number]  DATE:11/07/2006                            DOB:          April 30, 1950    HISTORY OF PRESENT ILLNESS:  The patient is a 60 year old African  American female, a patient of Dr. Maple Hudson, who has a known history of  allergic rhinitis and asthma who continues to smoke against medical  advice.  The patient presents today for a 2-week history of nasal  congestion, post nasal drip, productive cough with thick green sputum.  The patient was seen in her primary care physician's office 2 weeks ago  for a right ear infection and was given a Z-Pak.  The ear symptoms have  improved.  However, cough has continued to persist.  The patient denies  any chest pain, orthopnea, PND, leg swelling, or weight loss.  The  patient does feel that she had some blood-tinged sputum, however, never  saw any blood.   PAST MEDICAL HISTORY:  Reviewed.   CURRENT MEDICATIONS:  Reviewed.   PHYSICAL EXAMINATION:  GENERAL:  The patient is a female in no acute  distress.  VITAL SIGNS:  She is afebrile with stable vital signs.  O2 saturation is  99% on room air.  HEENT:  Nasal mucosa with some mild erythema, nontender sinuses.  Conjunctivae not injected.  TMs are normal.  NECK:  Supple without cervical adenopathy.  No JVD.  RESPIRATORY:  Lung sounds are clear to auscultation bilaterally without  any wheezing or crackles.  CARDIAC:  Regular rate.  ABDOMEN:  Soft, nontender, no palpable hepatosplenomegaly.  EXTREMITIES:  Warm without any edema.   IMPRESSION AND PLAN:  Acute tracheobronchitis.   Chest x-ray is pending at the time of dictation.   1. The patient is encouraged on smoking cessation.  2. She is to begin Levaquin 750 mg daily x5 days; Mucinex DM twice      daily; may use Tussionex 1/2 to 1 teaspoon, #4 ounces, every 12      hours as needed for  cough.  The patient is aware of the sedating      effect.  3. The patient has been instructed to return back here in 4 weeks with      Dr. Maple Hudson or sooner if needed.  4. The patient is to contact our office for sooner followup if      symptoms do not improve or worsen.      Rubye Oaks, NP  Electronically Signed      Clinton D. Maple Hudson, MD, [facility], FACP  Electronically Signed   TP/MedQ  DD: 11/07/2006  DT: 11/07/2006  Job #: [id number]

## 2010-06-30 NOTE — Assessment & Plan Note (Signed)
[facility]                             PULMONARY OFFICE NOTE   Ashley Larsen, Ashley Larsen                     MRN:          [id number]  DATE:12/23/2006                            DOB:          December 13, 1950    PROBLEMS:  1. Allergic rhinitis.  2. Cough.  3. Tobacco use.  4. Eustachian dysfunction.   PRIMARY CARE PHYSICIAN:  Dr. Oliver Barre.   HISTORY:  She fell at work, feeling very embarrassed by it, but  admitting that she still hurts across her coccyx area. Occasional vertex  headache. Ears feel stuffy. Little discharge from nose. She sneezes when  she first goes into work, but says that the building has been checked  with no problems identified. She continues allergy vaccine at 1 to 50  with no problems.   MEDICATIONS:  1. Prevacid 30 mg.  2. Calcitriol 0.5 mg.  3. Hydrochlorothiazide 12.5 mg.   ALLERGIES:  No medication allergy.   She still smokes at least a few cigarettes daily despite repeated  counseling.   OBJECTIVE:  Weight 186 pounds, blood pressure 118/78, pulse 110, room  air saturation 98%. Tympanic membranes may be minimally retracted. I do  not see fluid or erythema. Nasal airway is negative. There is no  cervical adenopathy. Voice is normal. Chest is clear.   IMPRESSION:  Rhinitis with eustachian dysfunction.   PLAN:  1. Continue vaccine. She is given Neo-Synephrine with Depo-Medrol 80      mg at her request after steroid talk and comparison of options.  2. Over-the-counter decongestant as discussed.  3. Schedule return 6 months, earlier p.r.n.     Clinton D. Maple Hudson, MD, [facility], FACP  Electronically Signed    CDY/MedQ  DD: 12/23/2006  DT: 12/25/2006  Job #: [id number]

## 2010-06-30 NOTE — Assessment & Plan Note (Signed)
[facility]                             PULMONARY OFFICE NOTE   Ashley Larsen, Ashley Larsen                     MRN:          [id number]  DATE:07/25/2006                            DOB:          1950/05/14    PROBLEM:  1. Allergic rhinitis.  2. Cough.  3. Tobacco use.  4. Eustachian dysfunction.   HISTORY:  Complains of odorous nasal discharge, some pressure discomfort  across her forehead, little wheeze or cough, but postnasal drainage.  She had been given Ceftin in Internal Medicine, and that helped  transiently in early May.  Chest x-ray for Dr. Jonny Ruiz on Jul 07, 2006  showed borderline cardiac enlargement, which was stable, and on acute  cardiopulmonary findings.   MEDICATIONS:  1. Prevacid 30 mg b.i.d.  2. Calcitriol.  3. Allergy vaccine at 1:50.   OBJECTIVE:  Weight 191 pounds.  BP 128/89.  Pulse 90.  Room air  saturation 98%.  Turbinates are edematous, but I do not see mucous.  Her pharynx is not  inflamed, and there is no postnasal drainage or hoarseness.  Tympanic  membranes look normal.  CHEST:  Minimal congestion noted when she laughs.  HEART:  Sounds regular without murmur or gallop.   IMPRESSION:  1. Rhinitis and probable sinusitis.  2. Bronchitis/asthma.   PLAN:  1. Smoking cessation was reemphasized.  2. Neo-Synephrine inhalation.  Depo-Medrol 800 mg IM was steroid talk.  3. Sudafed-PE.  4. Biaxin 500 mg b.i.d. for 7 days with 1 refill.  5. Schedule return in 6 months if she clears and does well, but      earlier p.r.n.     Clinton D. Maple Hudson, MD, [facility], FACP  Electronically Signed    CDY/MedQ  DD: 07/25/2006  DT: 07/26/2006  Job #: [id number]

## 2010-07-01 ENCOUNTER — Other Ambulatory Visit (INDEPENDENT_AMBULATORY_CARE_PROVIDER_SITE_OTHER): Payer: BC Managed Care – PPO

## 2010-07-01 ENCOUNTER — Other Ambulatory Visit: Payer: Self-pay | Admitting: *Deleted

## 2010-07-01 ENCOUNTER — Ambulatory Visit (INDEPENDENT_AMBULATORY_CARE_PROVIDER_SITE_OTHER): Payer: BC Managed Care – PPO

## 2010-07-01 ENCOUNTER — Other Ambulatory Visit (INDEPENDENT_AMBULATORY_CARE_PROVIDER_SITE_OTHER): Payer: BC Managed Care – PPO | Admitting: Internal Medicine

## 2010-07-01 DIAGNOSIS — Z0000 Encounter for general adult medical examination without abnormal findings: Secondary | ICD-10-CM

## 2010-07-01 DIAGNOSIS — Z13220 Encounter for screening for lipoid disorders: Secondary | ICD-10-CM

## 2010-07-01 DIAGNOSIS — J309 Allergic rhinitis, unspecified: Secondary | ICD-10-CM

## 2010-07-01 LAB — URINALYSIS, ROUTINE W REFLEX MICROSCOPIC
Bilirubin Urine: NEGATIVE
Hgb urine dipstick: NEGATIVE
Ketones, ur: NEGATIVE
Urine Glucose: NEGATIVE
Urobilinogen, UA: 0.2 (ref 0.0–1.0)

## 2010-07-01 LAB — BASIC METABOLIC PANEL
CO2: 30 mEq/L (ref 19–32)
Chloride: 102 mEq/L (ref 96–112)
Creatinine, Ser: 1 mg/dL (ref 0.4–1.2)
Potassium: 3.5 mEq/L (ref 3.5–5.1)
Sodium: 141 mEq/L (ref 135–145)

## 2010-07-01 LAB — LIPID PANEL
HDL: 64 mg/dL (ref >39.00)
Total CHOL/HDL Ratio: 4
VLDL: 15.2 mg/dL (ref 0.0–40.0)

## 2010-07-01 LAB — CBC WITH DIFFERENTIAL/PLATELET
Basophils Absolute: 0 10*3/uL (ref 0.0–0.1)
Hemoglobin: 13.4 g/dL (ref 12.0–15.0)
Lymphocytes Relative: 43.8 % (ref 12.0–46.0)
Monocytes Relative: 6.8 % (ref 3.0–12.0)
Neutro Abs: 2 10*3/uL (ref 1.4–7.7)
RBC: 4.37 Mil/uL (ref 3.87–5.11)
RDW: 13.6 % (ref 11.5–14.6)

## 2010-07-01 LAB — HEPATIC FUNCTION PANEL
ALT: 18 U/L (ref 0–35)
AST: 17 U/L (ref 0–37)
Alkaline Phosphatase: 69 U/L (ref 39–117)
Bilirubin, Direct: 0 mg/dL (ref 0.0–0.3)
Total Protein: 7.2 g/dL (ref 6.0–8.3)

## 2010-07-03 ENCOUNTER — Encounter: Payer: Self-pay | Admitting: Internal Medicine

## 2010-07-03 NOTE — Assessment & Plan Note (Signed)
[facility]                             PULMONARY OFFICE NOTE   Ashley Larsen, Ashley Larsen                     MRN:          [id number]  DATE:01/11/2006                            DOB:          November 25, 1950    PROBLEM LIST:  1. Allergic rhinitis.  2. Cough.  3. Tobacco use.  4. Eustachian dysfunction.   HISTORY:  She has begun allergy vaccine buildup and is progressing  without complications as scheduled getting injections here. She comes as  scheduled but describes wheezy cough for the past week and thinks she  may have caught a cold. She had seen the nurse practitioner October 31  and was treated with Omnicef, Mucinex and Nasacort. She says she felt  better for only a week after that visit. Low grade fever this weekend  without chills. Sputum yellow to green-brown. She went to an Urgent Care  3 days ago and was given doxicycline which she is continuing.   MEDICATIONS:  1. Allergy vaccine.  2. Prevacid 30 mg b.i.d.  3. Nasacort.  4. Arthrotec.  5. Skelaxin.  6. Mucinex.  7. Tramadol.  8. Restasis.   No medication allergy. No rescue inhaler.   OBJECTIVE:  Weight 193 pounds, BP 148/88, pulse regular 72, room air  saturation 98%. Wheezy cough, no rhonchi or dullness. Heart sounds  regular without murmur. No visible drainage or adenopathy or edema.   IMPRESSION:  1. Exacerbation of asthma and bronchitis probably viral.  2. Allergic rhinitis.  3. Chronic tobacco abuse.   PLAN:  1. Smoking cessation.  2. Finish doxicycline.  3. Nebulizer treatment today Xopenex 1.25 mg.  4. Depo-Medrol 80 mg IM.  5. Prednisone 8 day taper using 5 mg tabs, from 40 mg daily with      steroid talk done.  6. Schedule return in 2 months for allergy followup, early p.r.n.  7. She will continue to see Dr. [name] as scheduled for her pulmonary      and she will call him as needed.     Clinton D. Maple Hudson, MD, [facility], FACP  Electronically Signed    CDY/MedQ  DD:  01/11/2006  DT: 01/12/2006  Job #: [id number]

## 2010-07-03 NOTE — Assessment & Plan Note (Signed)
[name]                               PULMONARY OFFICE NOTE   Ashley Larsen, Ashley Larsen                     MRN:          [id number]  DATE:10/14/2005                            DOB:          Nov 08, 1950    REFERRING PHYSICIAN:  Charlaine Larsen. Ashley Sires, MD, FCCP   PROBLEM:  Allergy consultation at the kind request of Dr. Sherene Larsen, for this  woman followed here by [facility] and by Dr. Sherene Larsen for  pulmonary management of cough.   HISTORY:  She has had a history of asthma and cough.  A CT scan of the  sinuses in July, showed no acute abnormalities and responsive sinusitis to  medical therapy.  She is a smoker despite counseling.  She states that she  was on allergy vaccine with Dr. Stevphen Rochester and Dr. [place] Callas between 1980  and 2001.  She dropped off when they moved too far away for her to reach on  her lunch break.  She says cough and nasal congestion got markedly worse  after she stopped the vaccine.  She expects to be especially bad in the fall  season.  She says Dr. Corinda Gubler told her she had asthma and Dr. Sherene Larsen told her  he thought she did not.  She dates rhinitis symptoms nasal drainage,  sneezing, and cough to around August 19 or 20.  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   MEDICATIONS:  Prevacid, Nasacort AQ 2 puffs b.i.d., Zegerid, Arthrotek,  Skelaxin, Mucinex DM, Tramadol, Restasis, Tylenol Cold and Sinus.   REVIEW OF SYSTEMS:  Nasal congestion, ear congestion and popping, sniffing,  runny nose, and post nasal drip.  Occasional cough mainly with frequent  winter colds.  No routine wheeze or chest tightness.  Occasional abdominal  bloating.  Minor joint stiffness.   PAST HISTORY:  1. Pleurisy at age 69, otherwise no history of pneumonia and no ENT      surgery.  2. Uncertain diagnosis of  asthma as described.  3. Perineal rhinitis symptoms since she was a teenager.  4. Tubal ligation, 1992.  No history of urticaria, unusual reactions to insects, foods, or cosmetics.  No problems with latex, contrast dye, or aspirin.   SOCIAL HISTORY:  She says she smokes 6 or 7 cigarettes a day, married with 2  children.  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   ENVIRONMENTAL:  House has no basement and no pets.  There is central air-  conditioning, gas heat.  No significant mold or mildew problem.   FAMILY HISTORY:  Several with allergic rhinitis.  A brother had severe  asthma with frequent hospitalizations as a child.  Others with heart  disease, clotting disorders, rheumatism, and cancer.   OBJECTIVE:  VITAL SIGNS:  BP 142/94, pulse regular 79, room air saturation  99%.  GENERAL APPEARANCE:  A moderately obese woman in no acute distress.  SKIN:  No rash found.  ADENOPATHY:  None at the neck, shoulders, or axillae.  HEENT:  Palate length 2/4.  There is some scale in her external canals but  tympanic membranes are clear.  Nasopharynx and oropharynx are unremarkable.  There is no neck vein distention or stridor.  Voice quality is normal.  CHEST:  Quiet, clear, unlabored breath sounds.  No cough or wheeze.  HEART:  Sounds are regular without murmur or gallop.  EXTREMITIES:  Without cyanosis, clubbing, or edema.   IMPRESSION:  History suggests perineal allergic rhinitis with eustachian  dysfunction, possible asthma.   I get the impression there has been some difficulty clarifying symptoms and  I find an old PFT with blunted expiratory effort and technical comment that  effort could have been better.  A chest x-ray report, from February 2007,  described no acute cardiopulmonary findings and stable overall appearance  since May 2006.   PLAN:  1. Heavy emphasis on smoking cessation including pamphlet and prescription      for Chantix.  2.  Allegra 180 mg generic one daily p.r.n.  3. She will stay off of Allegra and over-the-counter allergy medication      for 3 days before return for allergy skin testing.   I appreciate the chance to work with her.                                   Clinton D. Maple Hudson, MD, [facility], FACP   CDY/MedQ  DD:  10/14/2005  DT:  10/15/2005  Job #:  [id number]   cc:   Ashley Larsen. Ashley Sires, MD, FCCP

## 2010-07-03 NOTE — Assessment & Plan Note (Signed)
[facility]                               PULMONARY OFFICE NOTE   DEREON, WILLIAMSEN                     MRN:          [id number]  DATE:11/09/2005                            DOB:          05/03/1950    PULMONARY/ALLERGY FOLLOWUP   PROBLEM:  1. Allergic rhinitis.  2. Cough.  3. Tobacco use.  4. Eustachian dysfunction.   HISTORY:  This is a smoker with allergic rhinitis and related problems,  including some question of asthma, who comes today for allergy skin testing  off of antihistamines.  She says she had a cold last week and took some  cough syrup then, but not in the last few days.  She continues to see Dr.  Posey Rea for primary care.   MEDICATIONS:  Nasacort AQ, Zegerid, Mucinex DM, Tylenol Cold and Sinus used  occasionally.   OBJECTIVE:  Weight 196 pounds.  BP 158/102.  Pulse is regular 77.  Room air  saturation 99%.  She had a little cough, almost wheezy, but otherwise with quiet breathing.  CHEST:  Quiet.  HEART:  Sounds were regular.  I found no adenopathy.   SKIN TEST:  Significant positive intradermal and puncture reactions,  particularly for grass, weed, and tree pollens, and house dust.   IMPRESSION:  1. Significant allergic rhinitis and a probable component of allergic      asthma.  2. Ongoing tobacco use complicates everything and was again discussed.  3. Recent viral respiratory syndrome, resolving.   PLAN:  1. Smoking cessation.  2. We carefully discussed allergy vaccine, its risks, realistic      expectations, and limitations.  We specifically discussed the      possibility of anaphylaxis.  She has been on allergy vaccine in the      past and very much wants to restart.  We will have the allergy      laboratory call her in      when her vaccine is ready to begin buildup here.  Schedule with me      again in 2 months, earlier p.r.n.                                   Clinton D. Maple Hudson, MD, FCCP, FACP   CDY/MedQ  DD:  11/09/2005  DT:  11/11/2005  Job #:  [id number]

## 2010-07-03 NOTE — Assessment & Plan Note (Signed)
[facility]                             PULMONARY OFFICE NOTE   CAMILLIA, MARCY                     MRN:          [id number]  DATE:03/08/2006                            DOB:          [birth year]    PROBLEM LIST:  1. Allergic rhinitis.  2. Cough.  3. Tobacco use.  4. Eustachian dysfunction.   HISTORY:  She continues allergy vaccine here at 1:50 with no problems.  In the last 3 days she has developed earache, sore throat, and begun  coughing up some brownish-yellow sputum.  Her husband has had a bad cold  for 2 weeks.  She had taken a short course of prednisone for another  episode about two weeks ago.  She had seen the nurse practitioner here  at the first of November, and taken a course of Omnicef and Mucinex at  that time.   MEDICATIONS:  1. Prevacid 30 mg b.i.d.  2. Nasacort.  3. Allergy vaccine.  4. P.r.n. use of tramadol.  5. Tylenol Cold & Sinus.  6. Mucinex DM.   NO MEDICATION ALLERGY.   OBJECTIVE:  VITAL SIGNS:  Weight 188 pounds, BP 122/88, pulse regular  85, room air saturation 99%.  LUNGS:  Dry cough.  HEENT:  Tympanic membranes were clear.  Throat was red.  There was no  post nasal drainage and no adenopathy.   IMPRESSION:  This may turn into an asthma exacerbation with a viral  tracheobronchitis.   Over the long term she has done very well but she seems to have had  several viral type infections this winter.   PLAN:  1. Depo-Medrol 80 mg IM.  2. Biaxin 500 mg b.i.d. for 5 days.  3. A-B Otic ear drops p.r.n. for ear pain as discussed.  4. Schedule return in 3 months.  5. Meanwhile fluid avoidance of usual viral infection sources and we      are going to look at options based on how she does through this      winter.  Reinforced smoking cessation.     Clinton D. Maple Hudson, MD, [facility], FACP  Electronically Signed    CDY/MedQ  DD: 03/12/2006  DT: 03/12/2006  Job #: [id number]

## 2010-07-03 NOTE — Assessment & Plan Note (Signed)
[facility]                               PULMONARY OFFICE NOTE   NAME:Ashley Larsen, Ashley Larsen                     MRN:          [id number]  DATE:09/06/2005                            DOB:          [birth year]    PULMONARY/SUMMARY FINAL FOLLOWUP OFFICE VISIT:   HISTORY:  [age]-year-old black female previously seen here for asthma  and unexplained cough, now complaining of persistent sensation of nasal  congestion despite using Nasacort in combination with Afrin as previously  instructed on her visit June 26.  She does report transient improvement from  prednisone but never complete resolution of her symptoms.  She is actively  smoking.   MEDICATIONS:  Please see face sheet dated September 05, 2005, although I have no  confidence at all that she takes the medications as they are prescribed.  I  did recommend she try Advil Cold and Sinus instead of Allegra for her nasal  congestion (in the absence of any clear-cut allergy symptoms), but she has  not followed this instruction.   Do not know if she used Afrin as I have recommended to help Nasacort  penetrate to the target tissues.   PHYSICAL EXAMINATION:  GENERAL:  She is an ambulatory black female with  somewhat of a hopeless, helpless affect and attitude.  VITAL SIGNS:  Afebrile.  HEENT:  Reveals moderate turbinate edema but no significant cyanosis or  polyps, excessive nasal discharge.  Oropharynx is clear with no postnasal  drainage or cobblestoning.  LUNGS:  Lung fields are perfectly clear bilaterally to auscultation and  percussion.  HEART:  Regular rhythm without murmur, rub or gallop.  ABDOMEN:  Soft, benign.  EXTREMITIES:  Warm without calf tenderness, cyanosis, clubbing, edema.   LABORATORY DATA:  CT scan of the sinuses reviewed from July 20 indicating no  acute abnormalities with complete resolution of previously identified  mucosal thickening.   IMPRESSION:  Improvement of sinusitis  after antibiotics and Nasacort but  persistent symptoms of nasal congestion of unclear etiology.  I think it  would be worthwhile having her evaluated here in our allergy division but no  further pulmonary followup is necessary.  I did review with her the use of  both Afrin and Advil Cold and Sinus to supplement main treatment with  Nasacort AQ pending allergy evaluation.   Obviously, it is critical that this patient make the commitment to stop  smoking which she has failed to do to date.  In the absence of this  commitment, she will have frequent upper respiratory tract symptoms and  since she remains skeptical that cigarettes truly have anything to do with  her symptoms, I think it would be important for every physician that sees  her to point this out consistently.  However, regular pulmonary followup is  not needed for the purpose of smoking  cessation.  (We would be happy to refer her to on to the [employer] program  if she would make the commitment to stop smoking which she has failed to do  today.)  Charlaine Dalton. [name], MD, [facility]   MBW/MedQ  DD:  09/07/2005  DT:  09/07/2005  Job #:  [id number]

## 2010-07-03 NOTE — Assessment & Plan Note (Signed)
[facility]                               PULMONARY OFFICE NOTE   Larsen, Ashley Larsen                     MRN:          [id number]  DATE:12/16/2005                            DOB:          08-Aug-1950    HISTORY OF PRESENT ILLNESS:  The patient is a 60 year old African-American  female, patient of Dr. [name], who has a known history of asthma, who presents  for an acute office visit.  The patient complains of a 5-day history of  productive cough with thick yellow sputum and general malaise.  The patient  denies any hemoptysis, orthopnea, PND or leg swelling.  The patient has not  been on any recent antibiotics or had recent travel.   Past medical history is reviewed.   Current medications reviewed.   PHYSICAL EXAMINATION:  GENERAL:  The patient is a black female in no acute  distress.  VITAL SIGNS:  She is afebrile with stable vital signs.  Her oxygen  saturation is 98% on room air.  HEENT:  Nasal mucosa with some mild erythema.  Nontender sinuses to  percussion.  NECK:  Supple without adenopathy or bruit.  No JVD.  CHEST:  Lung sounds are clear to auscultation bilaterally without any  wheezing or crackles.  CARDIAC:  A regular rate and rhythm.  ABDOMEN:  Soft without any hepatosplenomegaly.  EXTREMITIES:  Warm without any edema.   IMPRESSION AND PLAN:  Acute tracheobronchitis.  The patient is to begin  Omnicef x7 days.  Mucinex DM twice daily and Tramadol as needed for severe  cough.  The patient is also to use Nasacort AQ two puffs twice daily.  The  patient is to return here as scheduled or sooner if needed.      Rubye Oaks, NP  Electronically Signed     ______________________________  Charlaine Dalton. [name], MD, [facility]   TP/MedQ  DD: 12/16/2005  DT: 12/16/2005  Job #: [id number]

## 2010-07-07 ENCOUNTER — Encounter: Payer: Self-pay | Admitting: Internal Medicine

## 2010-07-07 ENCOUNTER — Ambulatory Visit (INDEPENDENT_AMBULATORY_CARE_PROVIDER_SITE_OTHER): Payer: BC Managed Care – PPO | Admitting: Internal Medicine

## 2010-07-07 ENCOUNTER — Ambulatory Visit (INDEPENDENT_AMBULATORY_CARE_PROVIDER_SITE_OTHER): Payer: BC Managed Care – PPO

## 2010-07-07 VITALS — BP 102/66 | HR 80 | Temp 98.4°F | Resp 16 | Ht 65.5 in | Wt 177.0 lb

## 2010-07-07 DIAGNOSIS — R51 Headache: Secondary | ICD-10-CM

## 2010-07-07 DIAGNOSIS — Z0000 Encounter for general adult medical examination without abnormal findings: Secondary | ICD-10-CM

## 2010-07-07 DIAGNOSIS — E785 Hyperlipidemia, unspecified: Secondary | ICD-10-CM

## 2010-07-07 DIAGNOSIS — J309 Allergic rhinitis, unspecified: Secondary | ICD-10-CM

## 2010-07-07 MED ORDER — ATORVASTATIN CALCIUM 10 MG PO TABS: 10.0000 mg | ORAL_TABLET | Freq: Every day | ORAL | Status: DC

## 2010-07-07 MED ORDER — FEXOFENADINE HCL 180 MG PO TABS: 180.0000 mg | ORAL_TABLET | Freq: Every day | ORAL | Status: DC

## 2010-07-07 MED ORDER — BUTALBITAL-APAP-CAFFEINE 50-325-40 MG PO TABS: 1.0000 | ORAL_TABLET | Freq: Two times a day (BID) | ORAL | Status: DC | PRN

## 2010-07-07 NOTE — Patient Instructions (Signed)
Heating pad to neck

## 2010-07-07 NOTE — Progress Notes (Signed)
Subjective:    Patient ID: Ashley Larsen, female    DOB: 03/26/1950, 60 y.o.   MRN: [id number]  HPI  The patient is here for a wellness exam. The patient has been doing well overall without major physical or psychological issues going on lately. The patient needs to address  chronic hypertension that has been well controlled with medicines; to address chronic  hyperlipidemia controlled with medicines as well; and to address HAs 2 per wk not controlled with medical Rx . Smoking less 4 cigs a day  Review of Systems  Constitutional: Negative for fever, chills, diaphoresis, activity change, appetite change, fatigue and unexpected weight change.       C/o HAs  HENT: Negative for hearing loss, ear pain, congestion, sore throat, sneezing, mouth sores, neck pain, dental problem, voice change, postnasal drip and sinus pressure.   Eyes: Negative for pain and visual disturbance.  Respiratory: Negative for cough, chest tightness, wheezing and stridor.   Cardiovascular: Negative for chest pain, palpitations and leg swelling.  Gastrointestinal: Negative for nausea, vomiting, abdominal pain, blood in stool, abdominal distention and rectal pain.  Genitourinary: Negative for dysuria, hematuria, decreased urine volume, vaginal bleeding, vaginal discharge, difficulty urinating, vaginal pain and menstrual problem.  Musculoskeletal: Negative for back pain, joint swelling and gait problem.  Skin: Negative for color change, rash and wound.  Neurological: Negative for dizziness, tremors, syncope, speech difficulty and light-headedness.  Hematological: Negative for adenopathy.  Psychiatric/Behavioral: Negative for suicidal ideas, hallucinations, behavioral problems, confusion, sleep disturbance, dysphoric mood and decreased concentration. The patient is not hyperactive.    Wt Readings from Last 3 Encounters:  07/07/10 177 lb (80.287 kg)  06/22/10 178 lb (80.74 kg)  04/08/10 173 lb 8 oz (78.699 kg)         Objective:   Physical Exam  Constitutional: She appears well-developed and well-nourished. No distress.  HENT:  Head: Normocephalic.  Right Ear: External ear normal.  Left Ear: External ear normal.  Nose: Nose normal.  Mouth/Throat: Oropharynx is clear and moist.  Eyes: Conjunctivae are normal. Pupils are equal, round, and reactive to light. Right eye exhibits no discharge. Left eye exhibits no discharge.  Neck: Normal range of motion. Neck supple. No JVD present. No tracheal deviation present. No thyromegaly present.  Cardiovascular: Normal rate, regular rhythm and normal heart sounds.   Pulmonary/Chest: No stridor. No respiratory distress. She has no wheezes.  Abdominal: Soft. Bowel sounds are normal. She exhibits no distension and no mass. There is no tenderness. There is no rebound and no guarding.  Musculoskeletal: She exhibits no edema and no tenderness.  Lymphadenopathy:    She has no cervical adenopathy.  Neurological: She displays normal reflexes. No cranial nerve deficit. She exhibits normal muscle tone. Coordination normal.  Skin: No rash noted. No erythema.  Psychiatric: She has a normal mood and affect. Her behavior is normal. Judgment and thought content normal.       Lab Results  Component Value Date   WBC 4.3* 07/01/2010   HGB 13.4 07/01/2010   HCT 38.8 07/01/2010   PLT 264.0 07/01/2010   CHOL 258* 07/01/2010   TRIG 76.0 07/01/2010   HDL 64.00 07/01/2010   LDLDIRECT 188.8 07/01/2010   ALT 18 07/01/2010   AST 17 07/01/2010   NA 141 07/01/2010   K 3.5 07/01/2010   CL 102 07/01/2010   CREATININE 1.0 07/01/2010   BUN 14 07/01/2010   CO2 30 07/01/2010   TSH 1.13 07/01/2010   HGBA1C 6.0 08/26/2008  Assessment & Plan:  Well adult exam We discussed age appropriate health related issues, including available/recomended screening tests and vaccinations. We discussed a need for adhering to healthy diet and exercise. Labs/EKG were reviewed/ordered. All questions were  answered.    HEADACHE Tension HAs - see meds

## 2010-07-13 ENCOUNTER — Encounter: Payer: Self-pay | Admitting: Internal Medicine

## 2010-07-13 NOTE — Assessment & Plan Note (Signed)
We discussed age appropriate health related issues, including available/recomended screening tests and vaccinations. We discussed a need for adhering to healthy diet and exercise. Labs/EKG were reviewed/ordered. All questions were answered.   

## 2010-07-13 NOTE — Assessment & Plan Note (Signed)
Tension HAs - see meds

## 2010-07-24 ENCOUNTER — Ambulatory Visit (INDEPENDENT_AMBULATORY_CARE_PROVIDER_SITE_OTHER): Payer: BC Managed Care – PPO

## 2010-07-24 DIAGNOSIS — J309 Allergic rhinitis, unspecified: Secondary | ICD-10-CM

## 2010-08-14 ENCOUNTER — Ambulatory Visit (INDEPENDENT_AMBULATORY_CARE_PROVIDER_SITE_OTHER): Payer: BC Managed Care – PPO

## 2010-08-14 DIAGNOSIS — J309 Allergic rhinitis, unspecified: Secondary | ICD-10-CM

## 2010-08-25 ENCOUNTER — Ambulatory Visit: Payer: BC Managed Care – PPO | Admitting: Internal Medicine

## 2010-09-10 ENCOUNTER — Ambulatory Visit (INDEPENDENT_AMBULATORY_CARE_PROVIDER_SITE_OTHER): Payer: BC Managed Care – PPO

## 2010-09-10 DIAGNOSIS — J309 Allergic rhinitis, unspecified: Secondary | ICD-10-CM

## 2010-09-17 ENCOUNTER — Other Ambulatory Visit: Payer: Self-pay | Admitting: Internal Medicine

## 2010-09-21 ENCOUNTER — Ambulatory Visit (INDEPENDENT_AMBULATORY_CARE_PROVIDER_SITE_OTHER): Payer: BC Managed Care – PPO | Admitting: Internal Medicine

## 2010-09-21 ENCOUNTER — Encounter: Payer: Self-pay | Admitting: Internal Medicine

## 2010-09-21 ENCOUNTER — Ambulatory Visit (INDEPENDENT_AMBULATORY_CARE_PROVIDER_SITE_OTHER): Payer: BC Managed Care – PPO

## 2010-09-21 ENCOUNTER — Telehealth: Payer: Self-pay | Admitting: Internal Medicine

## 2010-09-21 ENCOUNTER — Ambulatory Visit (INDEPENDENT_AMBULATORY_CARE_PROVIDER_SITE_OTHER)
Admission: RE | Admit: 2010-09-21 | Discharge: 2010-09-21 | Disposition: A | Discharge status: Home / Self Care | Payer: BC Managed Care – PPO | Source: Ambulatory Visit | Attending: Internal Medicine | Admitting: Internal Medicine

## 2010-09-21 ENCOUNTER — Ambulatory Visit: Payer: BC Managed Care – PPO

## 2010-09-21 VITALS — BP 130/82 | HR 80 | Temp 98.0°F | Resp 16 | Wt 185.0 lb

## 2010-09-21 DIAGNOSIS — M25579 Pain in unspecified ankle and joints of unspecified foot: Secondary | ICD-10-CM

## 2010-09-21 DIAGNOSIS — J309 Allergic rhinitis, unspecified: Secondary | ICD-10-CM

## 2010-09-21 DIAGNOSIS — M25572 Pain in left ankle and joints of left foot: Secondary | ICD-10-CM

## 2010-09-21 MED ORDER — MELOXICAM 15 MG PO TABS: 15.0000 mg | ORAL_TABLET | Freq: Every day | ORAL | Status: DC | PRN

## 2010-09-21 MED ORDER — HYDROCODONE-ACETAMINOPHEN 5-325 MG PO TABS: 1.0000 | ORAL_TABLET | Freq: Three times a day (TID) | ORAL | Status: DC | PRN

## 2010-09-21 NOTE — Telephone Encounter (Signed)
Left mess to call office back.   

## 2010-09-21 NOTE — Patient Instructions (Signed)
Elevate foot ACE wrap daytime

## 2010-09-21 NOTE — Progress Notes (Signed)
  Subjective:    Patient ID: Ashley Larsen, female    DOB: 09-14-1950, 60 y.o.   MRN: [id number]  HPI  She fell on mud - hurt R knee and R ankle. R knee is better. R inner ankle pain is worse.  Review of Systems  Constitutional: Negative for chills, activity change, appetite change, fatigue and unexpected weight change.  HENT: Negative for congestion, mouth sores and sinus pressure.   Eyes: Negative for visual disturbance.  Respiratory: Negative for cough and chest tightness.   Gastrointestinal: Negative for nausea and abdominal pain.  Genitourinary: Negative for frequency, difficulty urinating and vaginal pain.  Musculoskeletal: Positive for arthralgias (L ankle is swollen and w/pain on ROM, more medially.). Negative for back pain and gait problem.  Skin: Negative for pallor and rash.       L ankle is ecchymotic  Neurological: Negative for dizziness, tremors, weakness, numbness and headaches.  Psychiatric/Behavioral: Negative for confusion and sleep disturbance.       Objective:   Physical Exam        Assessment & Plan:

## 2010-09-21 NOTE — Assessment & Plan Note (Addendum)
Procedure Note :    Procedure :   Point of care (POC) sonography examination   Indication: L ankle is swollen and painful   Equipment used: Sonosite M-Turbo with HFL38x/13-6 MHz transducer linear probe. The images were stored in the unit and later transferred in storage.  The patient was placed in a decubitus position.  This study revealed a hypoechoic fluid around tibialis posterior tendon local swelling and a soft tissue swelling   Impression: sonographic finding c/w  L ankle sprain and tendonitis

## 2010-09-21 NOTE — Telephone Encounter (Signed)
Sarah, please, inform patient that her x ray did not show a fx. Rx as we discussed. RTC 2 wks Thx

## 2010-09-22 ENCOUNTER — Ambulatory Visit: Payer: BC Managed Care – PPO | Admitting: Internal Medicine

## 2010-09-22 NOTE — Telephone Encounter (Signed)
Patient informed. 

## 2010-09-27 ENCOUNTER — Encounter: Payer: Self-pay | Admitting: Internal Medicine

## 2010-09-30 ENCOUNTER — Encounter: Payer: Self-pay | Admitting: Gastroenterology

## 2010-10-01 ENCOUNTER — Ambulatory Visit (INDEPENDENT_AMBULATORY_CARE_PROVIDER_SITE_OTHER): Payer: BC Managed Care – PPO

## 2010-10-01 DIAGNOSIS — J309 Allergic rhinitis, unspecified: Secondary | ICD-10-CM

## 2010-10-08 ENCOUNTER — Ambulatory Visit (INDEPENDENT_AMBULATORY_CARE_PROVIDER_SITE_OTHER): Payer: BC Managed Care – PPO

## 2010-10-08 DIAGNOSIS — J309 Allergic rhinitis, unspecified: Secondary | ICD-10-CM

## 2010-10-16 ENCOUNTER — Ambulatory Visit (INDEPENDENT_AMBULATORY_CARE_PROVIDER_SITE_OTHER): Payer: BC Managed Care – PPO

## 2010-10-16 DIAGNOSIS — J309 Allergic rhinitis, unspecified: Secondary | ICD-10-CM

## 2010-10-20 ENCOUNTER — Ambulatory Visit (INDEPENDENT_AMBULATORY_CARE_PROVIDER_SITE_OTHER): Payer: BC Managed Care – PPO

## 2010-10-20 ENCOUNTER — Encounter: Payer: Self-pay | Admitting: Internal Medicine

## 2010-10-20 ENCOUNTER — Ambulatory Visit (INDEPENDENT_AMBULATORY_CARE_PROVIDER_SITE_OTHER): Payer: BC Managed Care – PPO | Admitting: Internal Medicine

## 2010-10-20 DIAGNOSIS — J449 Chronic obstructive pulmonary disease, unspecified: Secondary | ICD-10-CM

## 2010-10-20 DIAGNOSIS — Z23 Encounter for immunization: Secondary | ICD-10-CM

## 2010-10-20 DIAGNOSIS — J309 Allergic rhinitis, unspecified: Secondary | ICD-10-CM

## 2010-10-20 DIAGNOSIS — J4489 Other specified chronic obstructive pulmonary disease: Secondary | ICD-10-CM

## 2010-10-20 DIAGNOSIS — Z720 Tobacco use: Secondary | ICD-10-CM

## 2010-10-20 DIAGNOSIS — F17200 Nicotine dependence, unspecified, uncomplicated: Secondary | ICD-10-CM

## 2010-10-20 MED ORDER — ALBUTEROL SULFATE HFA 108 (90 BASE) MCG/ACT IN AERS: 2.0000 | INHALATION_SPRAY | RESPIRATORY_TRACT | Status: DC | PRN

## 2010-10-20 NOTE — Progress Notes (Signed)
Subjective:    Patient ID: Ashley Larsen, female    DOB: [birth year], 60 y.o.   MRN: [id number]  HPI 10/20/10 - 60 yof smoker followed for COPD, tobacco use, allergic rhinitis, complicated by insomnia, GERD, HBP Last here July 28, 2009- Still smoking 6-7 cigarettes/ day. She had a friend who quit with hypnosis so she would like to try that. Wants flu shot. Rhinitis in last 2 weeks with nasal congestion and pressure- not headache or green. Denies dyspnea, chest pain, swelling. Husband has liver cancer.  Never needing her inhalers- would like to keep a rescue inhaler available.  Review of Systems Constitutional:   No-   weight loss, night sweats, fevers, chills, fatigue, lassitude. HEENT:   No-  headaches, difficulty swallowing, tooth/dental problems, sore throat,       No-  sneezing, itching, ear ache,   +nasal congestion, post nasal drip,  CV:  No-   chest pain, orthopnea, PND, swelling in lower extremities, anasarca, dizziness, palpitations Resp: No-   shortness of breath with exertion or at rest.              No-   productive cough,  No non-productive cough,  No-  coughing up of blood.              No-   change in color of mucus.  No- wheezing.   Skin: No-   rash or lesions. GI:  No-   heartburn, indigestion, abdominal pain, nausea, vomiting, diarrhea,                 change in bowel habits, loss of appetite GU: No-   dysuria, change in color of urine, no urgency or frequency.  No- flank pain. MS:  No-   joint pain or swelling.  No- decreased range of motion.  No- back pain. Neuro- grossly normal to observation, Or:  Psych:  No- change in mood or affect. No depression or anxiety.  No memory loss.      Objective:   Physical Exam General- Alert, Oriented, Affect-appropriate, quiet, Distress- none acute   overweight Skin- rash-none, lesions- none, excoriation- none Lymphadenopathy- none Head- atraumatic            Eyes- Gross vision intact, PERRLA, conjunctivae clear secretions       Ears- Hearing, canals normal            Nose- mild turbinate edema,  No- Septal dev, mucus, polyps, erosion, perforation             Throat- Mallampati III-IV , mucosa clear , drainage- none, tonsils- atrophic Neck- flexible , trachea midline, no stridor , thyroid nl, carotid no bruit Chest - symmetrical excursion , unlabored           Heart/CV- RRR , no murmur , no gallop  , no rub, nl s1 s2                           - JVD- none , edema- none, stasis changes- none, varices- none           Lung- trace crackle low right back, otherwise clear, wheeze- none, cough- none , dullness-none, rub- none           Chest wall-  Abd- tender-no, distended-no, bowel sounds-present, HSM- no Br/ Gen/ Rectal- Not done, not indicated Extrem- cyanosis- none, clubbing, none, atrophy- none, strength- nl Neuro- grossly intact to observation  Assessment & Plan:

## 2010-10-20 NOTE — Patient Instructions (Signed)
Order- [facility] referral to Ms [place] for hypnosis therapy to help smoking cessation  Please keep trying to stop smoking   Information on [facility] smoking problem  Printed script for rescue inhaler- drug store can put it on file for you to get if you need it.  Flu vax  Try otc nasal decongestant  Phenylephrine- available as Sudafed-PE and others    As needed for stuffy nose

## 2010-10-20 NOTE — Assessment & Plan Note (Addendum)
Ongoing smoking. She is motivated to try hypnosis, so we will try to encourage her interest. I reviewed available support measures and will also give information about [facility]'s cessation program.  Not needing Symbicort, but will refill her rescue inhaler

## 2010-10-20 NOTE — Assessment & Plan Note (Signed)
Continues allergy vaccine. She is only taking occasional fexofenadine. We discussed phenylephrine as an option.

## 2010-10-22 ENCOUNTER — Encounter: Payer: Self-pay | Admitting: Internal Medicine

## 2010-10-30 ENCOUNTER — Ambulatory Visit (INDEPENDENT_AMBULATORY_CARE_PROVIDER_SITE_OTHER): Payer: BC Managed Care – PPO

## 2010-10-30 DIAGNOSIS — J309 Allergic rhinitis, unspecified: Secondary | ICD-10-CM

## 2010-11-10 ENCOUNTER — Encounter: Payer: Self-pay | Admitting: Internal Medicine

## 2010-11-12 ENCOUNTER — Ambulatory Visit (INDEPENDENT_AMBULATORY_CARE_PROVIDER_SITE_OTHER): Payer: BC Managed Care – PPO

## 2010-11-12 DIAGNOSIS — J309 Allergic rhinitis, unspecified: Secondary | ICD-10-CM

## 2010-11-17 ENCOUNTER — Ambulatory Visit (INDEPENDENT_AMBULATORY_CARE_PROVIDER_SITE_OTHER): Payer: BC Managed Care – PPO

## 2010-11-17 DIAGNOSIS — J309 Allergic rhinitis, unspecified: Secondary | ICD-10-CM

## 2010-11-18 ENCOUNTER — Ambulatory Visit (INDEPENDENT_AMBULATORY_CARE_PROVIDER_SITE_OTHER): Payer: BC Managed Care – PPO

## 2010-11-18 DIAGNOSIS — J309 Allergic rhinitis, unspecified: Secondary | ICD-10-CM

## 2010-11-24 LAB — BASIC METABOLIC PANEL
GFR calc non Af Amer: >60
Potassium: 3.1 — ABNORMAL LOW
Sodium: 138

## 2010-11-24 LAB — CBC
HCT: 38.1
Hemoglobin: 13.4
Platelets: 298
WBC: 7.8

## 2010-11-24 LAB — OCCULT BLOOD X 1 CARD TO LAB, STOOL: Fecal Occult Bld: POSITIVE

## 2010-11-24 LAB — PROTIME-INR: Prothrombin Time: 12.4

## 2010-11-24 LAB — ABO/RH: ABO/RH(D): A POS

## 2010-11-24 LAB — DIFFERENTIAL
Eosinophils Relative: 2
Lymphocytes Relative: 29
Lymphs Abs: 2.3
Monocytes Absolute: 0.6

## 2010-11-24 LAB — TYPE AND SCREEN

## 2010-11-24 LAB — APTT: aPTT: 27

## 2010-11-27 ENCOUNTER — Encounter: Payer: Self-pay | Admitting: Gastroenterology

## 2010-12-03 ENCOUNTER — Ambulatory Visit (INDEPENDENT_AMBULATORY_CARE_PROVIDER_SITE_OTHER): Payer: BC Managed Care – PPO

## 2010-12-03 DIAGNOSIS — J309 Allergic rhinitis, unspecified: Secondary | ICD-10-CM

## 2010-12-15 ENCOUNTER — Ambulatory Visit (AMBULATORY_SURGERY_CENTER): Payer: BC Managed Care – PPO

## 2010-12-15 ENCOUNTER — Ambulatory Visit (INDEPENDENT_AMBULATORY_CARE_PROVIDER_SITE_OTHER): Payer: BC Managed Care – PPO

## 2010-12-15 ENCOUNTER — Encounter: Payer: Self-pay | Admitting: Gastroenterology

## 2010-12-15 VITALS — Ht 65.5 in | Wt 178.0 lb

## 2010-12-15 DIAGNOSIS — Z1211 Encounter for screening for malignant neoplasm of colon: Secondary | ICD-10-CM

## 2010-12-15 DIAGNOSIS — Z800 Family history of malignant neoplasm of digestive organs: Secondary | ICD-10-CM

## 2010-12-15 DIAGNOSIS — Z86010 Personal history of colonic polyps: Secondary | ICD-10-CM

## 2010-12-15 DIAGNOSIS — J309 Allergic rhinitis, unspecified: Secondary | ICD-10-CM

## 2010-12-15 MED ORDER — PEG-KCL-NACL-NASULF-NA ASC-C 100 G PO SOLR: 1.0000 | Freq: Once | ORAL | Status: AC

## 2010-12-22 ENCOUNTER — Encounter: Payer: Self-pay | Admitting: Gastroenterology

## 2010-12-22 ENCOUNTER — Ambulatory Visit (AMBULATORY_SURGERY_CENTER): Payer: BC Managed Care – PPO | Admitting: Gastroenterology

## 2010-12-22 DIAGNOSIS — D126 Benign neoplasm of colon, unspecified: Secondary | ICD-10-CM

## 2010-12-22 DIAGNOSIS — Z86010 Personal history of colonic polyps: Secondary | ICD-10-CM

## 2010-12-22 DIAGNOSIS — D129 Benign neoplasm of anus and anal canal: Secondary | ICD-10-CM

## 2010-12-22 DIAGNOSIS — Z1211 Encounter for screening for malignant neoplasm of colon: Secondary | ICD-10-CM

## 2010-12-22 DIAGNOSIS — Z800 Family history of malignant neoplasm of digestive organs: Secondary | ICD-10-CM

## 2010-12-22 DIAGNOSIS — D128 Benign neoplasm of rectum: Secondary | ICD-10-CM

## 2010-12-22 MED ORDER — SODIUM CHLORIDE 0.9 % IV SOLN: 500.0000 mL | INTRAVENOUS | Status: DC

## 2010-12-22 NOTE — Patient Instructions (Signed)
Please refer to the blue and neon green sheets for instructions regarding diet and activity for the rest of today.  Handouts on polyps and diverticulosis given. Hold aspirin products for two weeks. Resume all other medications.

## 2010-12-23 ENCOUNTER — Telehealth: Payer: Self-pay | Admitting: *Deleted

## 2010-12-23 NOTE — Telephone Encounter (Signed)
Follow up Call- Patient questions:  Do you have a fever, pain , or abdominal swelling? no Pain Score  0 *  Have you tolerated food without any problems? yes  Have you been able to return to your normal activities? yes  Do you have any questions about your discharge instructions: Diet   no Medications  no Follow up visit  no  Do you have questions or concerns about your Care? no  Actions: * If pain score is 4 or above: No action needed, pain <4. Pt states she had some "light bleeding"-she states she had it on the tissue, but no blood in the stool or clots noted

## 2010-12-28 ENCOUNTER — Encounter: Payer: Self-pay | Admitting: Gastroenterology

## 2011-01-11 ENCOUNTER — Ambulatory Visit (INDEPENDENT_AMBULATORY_CARE_PROVIDER_SITE_OTHER): Payer: BC Managed Care – PPO

## 2011-01-11 ENCOUNTER — Ambulatory Visit (INDEPENDENT_AMBULATORY_CARE_PROVIDER_SITE_OTHER): Payer: BC Managed Care – PPO | Admitting: Internal Medicine

## 2011-01-11 ENCOUNTER — Encounter: Payer: Self-pay | Admitting: Internal Medicine

## 2011-01-11 ENCOUNTER — Other Ambulatory Visit: Payer: Self-pay | Admitting: *Deleted

## 2011-01-11 VITALS — BP 142/90 | HR 86 | Temp 98.5°F

## 2011-01-11 DIAGNOSIS — J029 Acute pharyngitis, unspecified: Secondary | ICD-10-CM

## 2011-01-11 DIAGNOSIS — J309 Allergic rhinitis, unspecified: Secondary | ICD-10-CM

## 2011-01-11 DIAGNOSIS — J069 Acute upper respiratory infection, unspecified: Secondary | ICD-10-CM

## 2011-01-11 MED ORDER — HYDROCHLOROTHIAZIDE 12.5 MG PO CAPS: 12.5000 mg | ORAL_CAPSULE | ORAL | Status: DC

## 2011-01-11 MED ORDER — OMEPRAZOLE 40 MG PO CPDR: 40.0000 mg | DELAYED_RELEASE_CAPSULE | Freq: Two times a day (BID) | ORAL | Status: DC

## 2011-01-11 MED ORDER — OXYMETAZOLINE HCL 0.05 % NA SOLN: 2.0000 | Freq: Two times a day (BID) | NASAL | Status: AC

## 2011-01-11 MED ORDER — AMOXICILLIN 500 MG PO CAPS: 500.0000 mg | ORAL_CAPSULE | Freq: Three times a day (TID) | ORAL | Status: AC

## 2011-01-11 NOTE — Progress Notes (Signed)
  Subjective:    HPI  complains of head cold symptoms  Onset >1 week ago, wax/wane symptoms  associated with rhinorrhea, sneezing, sore throat, mild headache and low grade fever Also myalgias, sinus pressure and mild-mod chest congestion No relief with OTC meds Precipitated by sick contacts  Past Medical History  Diagnosis Date  . Asthma   . Allergic rhinitis   . Insomnia   . Paresthesia   . GERD (gastroesophageal reflux disease)   . Diverticulosis   . Ovarian cyst   . Alopecia   . IBS (irritable bowel syndrome)   . Hypertension   . COPD (chronic obstructive pulmonary disease)     Review of Systems Constitutional: No fever or night sweats, no unexpected weight change Pulmonary: No pleurisy or hemoptysis Cardiovascular: No chest pain or palpitations     Objective:   Physical Exam BP 142/90  Pulse 86  Temp(Src) 98.5 F (36.9 C) (Oral)  SpO2 96% GEN: mildly ill appearing and audible head congestion HENT: NCAT, mild sinus tenderness bilaterally, nares with clear discharge, oropharynx mild erythema, no exudate Eyes: Vision grossly intact, no conjunctivitis Lungs: Clear to auscultation without rhonchi or wheeze, no increased work of breathing Cardiovascular: Regular rate and rhythm, no bilateral edema      Assessment & Plan:  Viral URI with cough Pharyngitis  Empiric antibiotics prescribed due to symptom duration greater than 7 days Symptomatic care with Tylenol or Advil, hydration and rest -  salt gargle advised as needed

## 2011-01-11 NOTE — Patient Instructions (Signed)
It was good to see you today. Amoxicillin antibiotic for her sinus and throat symptoms - Your prescription(s) have been submitted to your pharmacy. Please take as directed and contact our office if you believe you are having problem(s) with the medication(s). Also use Afrin nasal spray and Sudafed cold and sinus over-the-counter medications as described

## 2011-01-11 NOTE — Telephone Encounter (Signed)
Pt is here seeing Dr. Felicity Coyer states she has lost her BP med and needing new rx sent to pharmacy, also on her omeprazole....01/11/11@11 :43am/LMB

## 2011-02-15 ENCOUNTER — Encounter: Payer: Self-pay | Admitting: Internal Medicine

## 2011-02-15 ENCOUNTER — Ambulatory Visit (INDEPENDENT_AMBULATORY_CARE_PROVIDER_SITE_OTHER): Payer: Self-pay | Admitting: Internal Medicine

## 2011-02-15 ENCOUNTER — Telehealth: Payer: Self-pay | Admitting: Internal Medicine

## 2011-02-15 VITALS — BP 110/70 | HR 84 | Temp 99.0°F | Resp 16 | Wt 176.0 lb

## 2011-02-15 DIAGNOSIS — J069 Acute upper respiratory infection, unspecified: Secondary | ICD-10-CM

## 2011-02-15 MED ORDER — AMOXICILLIN-POT CLAVULANATE 875-125 MG PO TABS: 1.0000 | ORAL_TABLET | Freq: Two times a day (BID) | ORAL | Status: DC

## 2011-02-15 MED ORDER — PROMETHAZINE-CODEINE 6.25-10 MG/5ML PO SYRP: 5.0000 mL | ORAL_SOLUTION | ORAL | Status: DC | PRN

## 2011-02-15 MED ORDER — BUDESONIDE-FORMOTEROL FUMARATE 160-4.5 MCG/ACT IN AERO: 2.0000 | INHALATION_SPRAY | Freq: Two times a day (BID) | RESPIRATORY_TRACT | Status: DC

## 2011-02-15 MED ORDER — PREDNISONE 10 MG PO TABS: ORAL_TABLET | ORAL | Status: DC

## 2011-02-15 NOTE — Patient Instructions (Signed)
Use over-the-counter  "cold" medicines  such as "Tylenol cold" , "Advil cold",  "Mucinex" or" Mucinex D"  for cough and congestion.   Avoid decongestants if you have high blood pressure and use "Afrin" nasal spray for nasal congestion as directed instead. Use" Delsym" or" Robitussin" cough syrup varietis for cough.  You can use plain "Tylenol" or "Advil" for fever, chills and achyness.  Please, make an appointment if you are not better or if you're worse.  

## 2011-02-15 NOTE — Telephone Encounter (Signed)
Received copies from [facility],on 12.31.12. Forwarded 4 pages to Dr. Posey Rea ,for review.  sj

## 2011-02-15 NOTE — Progress Notes (Signed)
  Subjective:    Patient ID: Chenoah Mcnally, female    DOB: Sep 18, 1950, 60 y.o.   MRN: [id number]  HPI   HPI  C/o URI sx's x   5 days. C/o ST, cough, weakness. Not better with OTC medicines. Actually, the patient is getting worse. The patient did not sleep last night due to cough - yellow d/c  Review of Systems  Constitutional: Positive for fever, chills and fatigue.  HENT: Positive for congestion, rhinorrhea, sneezing and postnasal drip.   Eyes: Positive for photophobia and pain. Negative for discharge and visual disturbance.  Respiratory: Positive for cough and wheezing.   Positive for chest pain.  Gastrointestinal: Negative for vomiting, abdominal pain, diarrhea and abdominal distention.  Genitourinary: Negative for dysuria and difficulty urinating.  Skin: Negative for rash.  Neurological: Positive for dizziness, weakness and light-headedness.      Review of Systems     Objective:   Physical Exam        Assessment & Plan:

## 2011-03-12 ENCOUNTER — Other Ambulatory Visit: Payer: Self-pay | Admitting: Gynecology

## 2011-03-12 ENCOUNTER — Ambulatory Visit (INDEPENDENT_AMBULATORY_CARE_PROVIDER_SITE_OTHER): Payer: 59

## 2011-03-12 DIAGNOSIS — J309 Allergic rhinitis, unspecified: Secondary | ICD-10-CM

## 2011-03-18 ENCOUNTER — Ambulatory Visit (INDEPENDENT_AMBULATORY_CARE_PROVIDER_SITE_OTHER): Payer: 59

## 2011-03-18 DIAGNOSIS — J309 Allergic rhinitis, unspecified: Secondary | ICD-10-CM

## 2011-03-26 ENCOUNTER — Telehealth: Payer: Self-pay | Admitting: Internal Medicine

## 2011-03-26 ENCOUNTER — Encounter: Payer: Self-pay | Admitting: Internal Medicine

## 2011-03-26 NOTE — Telephone Encounter (Signed)
OK to fill this prescription with additional refills x0 Thank you!  

## 2011-03-26 NOTE — Telephone Encounter (Signed)
Pt req rx Promethazine-codeine syrup 1 teaspoon full QID needed for cough Ok to refill?

## 2011-03-29 MED ORDER — PROMETHAZINE-CODEINE 6.25-10 MG/5ML PO SYRP: 5.0000 mL | ORAL_SOLUTION | ORAL | Status: DC | PRN

## 2011-03-29 NOTE — Telephone Encounter (Signed)
Addended by: Merrilyn Puma on: 03/29/2011 02:52 PM   Modules accepted: Orders

## 2011-04-08 ENCOUNTER — Ambulatory Visit (INDEPENDENT_AMBULATORY_CARE_PROVIDER_SITE_OTHER): Payer: 59

## 2011-04-08 ENCOUNTER — Telehealth: Payer: Self-pay | Admitting: Internal Medicine

## 2011-04-08 DIAGNOSIS — J309 Allergic rhinitis, unspecified: Secondary | ICD-10-CM

## 2011-04-08 MED ORDER — AZITHROMYCIN 250 MG PO TABS: ORAL_TABLET | ORAL | Status: AC

## 2011-04-08 NOTE — Telephone Encounter (Signed)
Per [employer] to give Zpak #1 take as directed no refills. Pt is aware of Rx sent to [facility] [address]

## 2011-04-08 NOTE — Telephone Encounter (Signed)
Spoke with pt and she is c/o HA, sinus pressure and yellow nasal congestion x 4 days. Also states last night had fever and chills before she went to bed. Would like something called in. Please advise, thanks! Allergies  Allergen Reactions  . Metronidazole     REACTION: upset stomach  . Tylox

## 2011-04-15 ENCOUNTER — Ambulatory Visit (INDEPENDENT_AMBULATORY_CARE_PROVIDER_SITE_OTHER): Payer: 59

## 2011-04-15 DIAGNOSIS — J309 Allergic rhinitis, unspecified: Secondary | ICD-10-CM

## 2011-04-29 ENCOUNTER — Ambulatory Visit (INDEPENDENT_AMBULATORY_CARE_PROVIDER_SITE_OTHER): Payer: 59

## 2011-04-29 DIAGNOSIS — J309 Allergic rhinitis, unspecified: Secondary | ICD-10-CM

## 2011-05-02 ENCOUNTER — Encounter: Payer: Self-pay | Admitting: Internal Medicine

## 2011-05-02 DIAGNOSIS — J069 Acute upper respiratory infection, unspecified: Secondary | ICD-10-CM | POA: Insufficient documentation

## 2011-05-02 NOTE — Assessment & Plan Note (Signed)
See meds 

## 2011-05-11 ENCOUNTER — Ambulatory Visit (INDEPENDENT_AMBULATORY_CARE_PROVIDER_SITE_OTHER): Payer: 59

## 2011-05-11 DIAGNOSIS — J309 Allergic rhinitis, unspecified: Secondary | ICD-10-CM

## 2011-05-20 ENCOUNTER — Ambulatory Visit (INDEPENDENT_AMBULATORY_CARE_PROVIDER_SITE_OTHER): Payer: 59

## 2011-05-20 DIAGNOSIS — J309 Allergic rhinitis, unspecified: Secondary | ICD-10-CM

## 2011-06-10 ENCOUNTER — Ambulatory Visit (INDEPENDENT_AMBULATORY_CARE_PROVIDER_SITE_OTHER): Payer: 59

## 2011-06-10 DIAGNOSIS — J309 Allergic rhinitis, unspecified: Secondary | ICD-10-CM

## 2011-06-17 ENCOUNTER — Ambulatory Visit (INDEPENDENT_AMBULATORY_CARE_PROVIDER_SITE_OTHER): Payer: 59

## 2011-06-17 DIAGNOSIS — J309 Allergic rhinitis, unspecified: Secondary | ICD-10-CM

## 2011-06-23 ENCOUNTER — Ambulatory Visit (INDEPENDENT_AMBULATORY_CARE_PROVIDER_SITE_OTHER): Payer: 59

## 2011-06-23 DIAGNOSIS — J309 Allergic rhinitis, unspecified: Secondary | ICD-10-CM

## 2011-07-01 ENCOUNTER — Ambulatory Visit (INDEPENDENT_AMBULATORY_CARE_PROVIDER_SITE_OTHER): Payer: 59

## 2011-07-01 DIAGNOSIS — J309 Allergic rhinitis, unspecified: Secondary | ICD-10-CM

## 2011-07-02 ENCOUNTER — Telehealth: Payer: Self-pay | Admitting: *Deleted

## 2011-07-02 MED ORDER — DICLOFENAC SODIUM 1 % TD GEL: 1.0000 "application " | Freq: Four times a day (QID) | TRANSDERMAL | Status: DC

## 2011-07-02 NOTE — Telephone Encounter (Signed)
voltaren gel Thx

## 2011-07-02 NOTE — Telephone Encounter (Signed)
Pt is requesting pain med or anti-inflammatory for left knee pain. She states she is currently taking ibuprofen for this with no relieve. Please advise.

## 2011-07-02 NOTE — Telephone Encounter (Signed)
Left detailed mess informing pt of below.  

## 2011-07-20 ENCOUNTER — Other Ambulatory Visit: Payer: Self-pay | Admitting: Internal Medicine

## 2011-07-21 ENCOUNTER — Telehealth: Payer: Self-pay | Admitting: *Deleted

## 2011-07-21 NOTE — Telephone Encounter (Signed)
Requested Medications     promethazine-codeine (PHENERGAN WITH CODEINE) 6.25-10 MG/5ML syrup [Pharmacy Med Name: PROMETHAZINE-CODEINE SYRUP]   take 1 teaspoonful by mouth four times a day if needed for cough   Disp: 300 mL R: 0 Start: 07/20/2011  Class: Normal   Requested on: 03/29/2011   Last refill: 03/29/2011

## 2011-07-21 NOTE — Telephone Encounter (Signed)
OK to fill this prescription with additional refills x0 OV if sick Thank you!  

## 2011-07-22 ENCOUNTER — Encounter: Payer: Self-pay | Admitting: Endocrinology

## 2011-07-22 ENCOUNTER — Ambulatory Visit (INDEPENDENT_AMBULATORY_CARE_PROVIDER_SITE_OTHER): Payer: 59

## 2011-07-22 ENCOUNTER — Ambulatory Visit (INDEPENDENT_AMBULATORY_CARE_PROVIDER_SITE_OTHER): Payer: 59 | Admitting: Endocrinology

## 2011-07-22 VITALS — BP 118/82 | HR 80 | Temp 97.9°F | Ht 65.5 in | Wt 187.0 lb

## 2011-07-22 DIAGNOSIS — J069 Acute upper respiratory infection, unspecified: Secondary | ICD-10-CM

## 2011-07-22 DIAGNOSIS — J309 Allergic rhinitis, unspecified: Secondary | ICD-10-CM

## 2011-07-22 MED ORDER — FEXOFENADINE HCL 180 MG PO TABS: 180.0000 mg | ORAL_TABLET | Freq: Every day | ORAL | Status: DC

## 2011-07-22 MED ORDER — PROMETHAZINE-CODEINE 6.25-10 MG/5ML PO SYRP: 5.0000 mL | ORAL_SOLUTION | ORAL | Status: DC | PRN

## 2011-07-22 MED ORDER — CEFUROXIME AXETIL 250 MG PO TABS: 250.0000 mg | ORAL_TABLET | Freq: Two times a day (BID) | ORAL | Status: AC

## 2011-07-22 NOTE — Progress Notes (Signed)
Subjective:    Patient ID: Ashley Larsen, female    DOB: [birth year], 61 y.o.   MRN: [id number]  HPI Pt states 1 week of moderate dry-quality cough in the chest, and assoc rhinorrhea.   Past Medical History  Diagnosis Date  . Asthma   . Allergic rhinitis   . Insomnia   . Paresthesia   . GERD (gastroesophageal reflux disease)   . Diverticulosis   . Ovarian cyst   . Alopecia   . IBS (irritable bowel syndrome)   . Hypertension   . COPD (chronic obstructive pulmonary disease)     Past Surgical History  Procedure Date  . Tubal ligation   . Knee arthroscopy     Left  . Oophorectomy     Left  . Rotator cuff repair   . Colonoscopy     History   Social History  . Marital Status: Married    Spouse Name: N/A    Number of Children: N/A  . Years of Education: N/A   Occupational History  . Retail sales [employer]   Social History Main Topics  . Smoking status: Current Everyday Smoker -- 0.5 packs/day    Types: Cigarettes  . Smokeless tobacco: Never Used  . Alcohol Use: No  . Drug Use: No  . Sexually Active: Yes   Other Topics Concern  . Not on file   Social History Narrative  . No narrative on file    Current Outpatient Prescriptions on File Prior to Visit  Medication Sig Dispense Refill  . diclofenac sodium (VOLTAREN) 1 % GEL Apply 1 application topically 4 (four) times daily.  100 g  3  . fexofenadine (ALLEGRA) 180 MG tablet Take 1 tablet (180 mg total) by mouth daily.  90 tablet  3  . hydrochlorothiazide (MICROZIDE) 12.5 MG capsule Take 1 capsule (12.5 mg total) by mouth every morning.  30 capsule  11  . omeprazole (PRILOSEC) 40 MG capsule Take 1 capsule (40 mg total) by mouth 2 (two) times daily.  60 capsule  11  . budesonide-formoterol (SYMBICORT) 160-4.5 MCG/ACT inhaler Inhale 2 puffs into the lungs 2 (two) times daily.  1 Inhaler  3  . butalbital-acetaminophen-caffeine (FIORICET) 50-325-40 MG per tablet Take 1-2 tablets by mouth 2 (two) times daily as needed  for headache.  90 tablet  3  . fluticasone (FLONASE) 50 MCG/ACT nasal spray 2 sprays by Nasal route daily.        [name] HYDROcodone-acetaminophen (NORCO) 5-325 MG per tablet Take 1 tablet by mouth every 8 (eight) hours as needed.        . promethazine-codeine (PHENERGAN WITH CODEINE) 6.25-10 MG/5ML syrup Take 5 mLs by mouth every 4 (four) hours as needed for cough.  300 mL  0    Allergies  Allergen Reactions  . Metronidazole     REACTION: upset stomach  . Oxycodone-Acetaminophen     Family History  Problem Relation Age of Onset  . Hypertension    . Cancer Sister 43    colon  . Diabetes Sister   . Cancer Brother     brother  . Colon polyps Sister     and Brother  . Cancer Sister 63     colon  . Kidney disease Brother   . Diabetes Brother   . Diabetes Brother   . Heart disease Father   . Other Mother     sclerdermia  . Diabetes Paternal Grandfather   . Colon cancer Sister     BP 118/82  Pulse 80  Temp 97.9 F (36.6 C) (Oral)  Ht 5' 5.5" (1.664 m)  Wt 187 lb (84.823 kg)  BMI 30.65 kg/m2  SpO2 97%   Review of Systems Denies fever, but she has nasal congestion.     Objective:   Physical Exam VITAL SIGNS:  See vs page GENERAL: no distress head: no deformity eyes: no periorbital swelling, no proptosis external nose and ears are normal mouth: no lesion seen Both tm's are slightly red LUNGS:  Clear to auscultation.       Assessment & Plan:  Glenford Peers, new

## 2011-07-22 NOTE — Patient Instructions (Addendum)
i have sent a prescription to your pharmacy, for an antibiotic pill I hope you feel better soon.  If you don't feel better by next week, please call back.   

## 2011-07-22 NOTE — Telephone Encounter (Signed)
Done

## 2011-08-06 ENCOUNTER — Ambulatory Visit (INDEPENDENT_AMBULATORY_CARE_PROVIDER_SITE_OTHER): Payer: 59

## 2011-08-06 DIAGNOSIS — J309 Allergic rhinitis, unspecified: Secondary | ICD-10-CM

## 2011-08-13 ENCOUNTER — Ambulatory Visit (INDEPENDENT_AMBULATORY_CARE_PROVIDER_SITE_OTHER): Payer: 59

## 2011-08-13 DIAGNOSIS — J309 Allergic rhinitis, unspecified: Secondary | ICD-10-CM

## 2011-08-17 ENCOUNTER — Ambulatory Visit (INDEPENDENT_AMBULATORY_CARE_PROVIDER_SITE_OTHER): Payer: 59

## 2011-08-17 DIAGNOSIS — J309 Allergic rhinitis, unspecified: Secondary | ICD-10-CM

## 2011-08-18 ENCOUNTER — Ambulatory Visit (INDEPENDENT_AMBULATORY_CARE_PROVIDER_SITE_OTHER): Payer: 59

## 2011-08-18 DIAGNOSIS — J309 Allergic rhinitis, unspecified: Secondary | ICD-10-CM

## 2011-08-24 ENCOUNTER — Other Ambulatory Visit: Payer: Self-pay | Admitting: Gynecology

## 2011-08-24 DIAGNOSIS — Z1231 Encounter for screening mammogram for malignant neoplasm of breast: Secondary | ICD-10-CM

## 2011-08-25 ENCOUNTER — Telehealth: Payer: Self-pay | Admitting: Internal Medicine

## 2011-08-25 DIAGNOSIS — I959 Hypotension, unspecified: Secondary | ICD-10-CM

## 2011-08-25 DIAGNOSIS — E785 Hyperlipidemia, unspecified: Secondary | ICD-10-CM

## 2011-08-25 NOTE — Telephone Encounter (Signed)
Lipids, LFTs were ordered in 5/13 Thx

## 2011-08-25 NOTE — Telephone Encounter (Signed)
The pt called and is hoping to get repeat blood work done soon. She sated during her last apt with Dr.[name] he mentioned getting repeat blood work done.  She did not know exactly what type of labs.   Please call pt for further details and to let her know when the blood work orders are in.   Thanks!

## 2011-08-25 NOTE — Telephone Encounter (Signed)
Please advise what labs are needed? I cant tell from last OV note. Thanks

## 2011-08-26 ENCOUNTER — Other Ambulatory Visit (INDEPENDENT_AMBULATORY_CARE_PROVIDER_SITE_OTHER): Payer: 59

## 2011-08-26 ENCOUNTER — Ambulatory Visit (INDEPENDENT_AMBULATORY_CARE_PROVIDER_SITE_OTHER): Payer: 59

## 2011-08-26 ENCOUNTER — Ambulatory Visit
Admission: RE | Admit: 2011-08-26 | Discharge: 2011-08-26 | Disposition: A | Discharge status: Home / Self Care | Payer: 59 | Source: Ambulatory Visit | Attending: Gynecology | Admitting: Gynecology

## 2011-08-26 DIAGNOSIS — Z1231 Encounter for screening mammogram for malignant neoplasm of breast: Secondary | ICD-10-CM

## 2011-08-26 DIAGNOSIS — E785 Hyperlipidemia, unspecified: Secondary | ICD-10-CM

## 2011-08-26 DIAGNOSIS — I959 Hypotension, unspecified: Secondary | ICD-10-CM

## 2011-08-26 DIAGNOSIS — J309 Allergic rhinitis, unspecified: Secondary | ICD-10-CM

## 2011-08-26 LAB — HEPATIC FUNCTION PANEL
AST: 19 U/L (ref 0–37)
Albumin: 4.2 g/dL (ref 3.5–5.2)
Alkaline Phosphatase: 80 U/L (ref 39–117)
Bilirubin, Direct: 0 mg/dL (ref 0.0–0.3)
Total Bilirubin: 0.7 mg/dL (ref 0.3–1.2)

## 2011-08-26 LAB — LIPID PANEL
HDL: 44 mg/dL (ref >39.00)
Total CHOL/HDL Ratio: 7
VLDL: 54.4 mg/dL — ABNORMAL HIGH (ref 0.0–40.0)

## 2011-08-26 NOTE — Telephone Encounter (Signed)
Labs entered. Left detailed mess informing pt.  

## 2011-09-02 ENCOUNTER — Telehealth: Payer: Self-pay | Admitting: Internal Medicine

## 2011-09-02 ENCOUNTER — Telehealth: Payer: Self-pay | Admitting: *Deleted

## 2011-09-02 NOTE — Telephone Encounter (Signed)
Pt informed of below. I advised her to check around and see if she can get this med on a $4 list somewhere or just but it OTC. I advised her to call us back if she doesn't have any luck and wants [place] to call something else in.

## 2011-09-02 NOTE — Telephone Encounter (Signed)
LMTCB

## 2011-09-02 NOTE — Telephone Encounter (Signed)
Per [employer] prior authorization for Omeprazole is not covered and no PA is needed. Left mess for patient to call back.

## 2011-09-03 ENCOUNTER — Ambulatory Visit (INDEPENDENT_AMBULATORY_CARE_PROVIDER_SITE_OTHER): Payer: 59 | Admitting: Pulmonary Disease

## 2011-09-03 ENCOUNTER — Encounter: Payer: Self-pay | Admitting: Pulmonary Disease

## 2011-09-03 ENCOUNTER — Ambulatory Visit (INDEPENDENT_AMBULATORY_CARE_PROVIDER_SITE_OTHER): Payer: 59

## 2011-09-03 VITALS — BP 124/80 | HR 92 | Temp 98.2°F | Ht 65.5 in | Wt 186.0 lb

## 2011-09-03 DIAGNOSIS — J309 Allergic rhinitis, unspecified: Secondary | ICD-10-CM

## 2011-09-03 DIAGNOSIS — J449 Chronic obstructive pulmonary disease, unspecified: Secondary | ICD-10-CM

## 2011-09-03 DIAGNOSIS — J019 Acute sinusitis, unspecified: Secondary | ICD-10-CM | POA: Insufficient documentation

## 2011-09-03 NOTE — Assessment & Plan Note (Signed)
The patient's history is most consistent with acute sinusitis.  We'll treat her with a round of antibiotics, as well as an aggressive nasal hygiene regimen.  I have also stressed to her the importance of total smoking cessation, and how ongoing smoking leads to recurrent sinopulmonary infections.

## 2011-09-03 NOTE — Telephone Encounter (Signed)
Called and spoke with pt. She is c/o sinus pressure, PND and non prod cough x 1 wk. She states has tried otc meds without any relief. OV with KC at 11:30 am today.

## 2011-09-03 NOTE — Patient Instructions (Addendum)
Will treat your sinus infection with omnicef 300mg   2 each am for one week. Use neilmed sinus rinses that you can get over the counter am and pm for one week.  (see tear off sheet) Can try chlorpheniramine 4mg   2 each night at bedtime for postnasal drip Stop smoking.  This causes recurrent sinus and pulmonary infections. followup with Dr. Maple Hudson as scheduled.

## 2011-09-03 NOTE — Telephone Encounter (Signed)
Pt returned call.  Ashley Larsen ° °

## 2011-09-03 NOTE — Progress Notes (Signed)
  Subjective:    Patient ID: Ashley Larsen, female    DOB: 19-Feb-1950, 61 y.o.   MRN: [id number]  HPI The patient comes in today for an acute sick visit.  She is normally followed by Dr. Maple Hudson for COPD.  A review of the chart shows that she has required multiple rounds of antibiotics this year so far, and unfortunately continues to smoke every day.  She denies noncompliance with her symbicort.  She notes a one-week history of worsening nasal congestion, postnasal drip, sinus pressure, and small-to-moderate quantities of purulent mucus from her nose.  She has also had a sore throat.  She denies any significant chest congestion, and feels that her breathing is at baseline.   Review of Systems  Constitutional: Negative for fever and unexpected weight change.  HENT: Positive for sore throat, postnasal drip and sinus pressure. Negative for ear pain, nosebleeds, congestion, rhinorrhea, sneezing, trouble swallowing and dental problem.   Eyes: Negative for redness and itching.  Respiratory: Positive for cough. Negative for chest tightness, shortness of breath and wheezing.   Cardiovascular: Negative for palpitations and leg swelling.  Gastrointestinal: Negative for nausea and vomiting.  Genitourinary: Negative for dysuria.  Musculoskeletal: Negative for joint swelling.  Skin: Negative for rash.  Neurological: Positive for headaches.  Hematological: Does not bruise/bleed easily.  Psychiatric/Behavioral: Negative for dysphoric mood. The patient is not nervous/anxious.   All other systems reviewed and are negative.       Objective:   Physical Exam Obese female in no acute distress Nose with swollen nasal mucosa, significant erythema, but no obvious purulence. Oropharynx clear, no exudates Chest with mildly decreased breath sounds, but no wheezing or rhonchi Cardiac exam with regular rate and rhythm Lower extremities without edema, cyanosis Alert and oriented, moves all 4 extremities.         Assessment & Plan:

## 2011-09-03 NOTE — Telephone Encounter (Signed)
lmomtcb  

## 2011-09-06 ENCOUNTER — Telehealth: Payer: Self-pay | Admitting: Pulmonary Disease

## 2011-09-06 MED ORDER — CEFDINIR 300 MG PO CAPS: ORAL_CAPSULE | ORAL | Status: DC

## 2011-09-06 NOTE — Telephone Encounter (Signed)
lmomtcb x1 for pt---rx was never sent in from Friday--i have sent rx into pharmacy

## 2011-09-07 ENCOUNTER — Encounter: Payer: Self-pay | Admitting: Internal Medicine

## 2011-09-07 NOTE — Telephone Encounter (Signed)
Pt is aware. Romanda Turrubiates, CMA  

## 2011-09-17 ENCOUNTER — Ambulatory Visit (INDEPENDENT_AMBULATORY_CARE_PROVIDER_SITE_OTHER): Payer: 59

## 2011-09-17 DIAGNOSIS — J309 Allergic rhinitis, unspecified: Secondary | ICD-10-CM

## 2011-09-23 ENCOUNTER — Ambulatory Visit (INDEPENDENT_AMBULATORY_CARE_PROVIDER_SITE_OTHER): Payer: 59

## 2011-09-23 DIAGNOSIS — J309 Allergic rhinitis, unspecified: Secondary | ICD-10-CM

## 2011-09-28 ENCOUNTER — Ambulatory Visit (INDEPENDENT_AMBULATORY_CARE_PROVIDER_SITE_OTHER): Payer: 59

## 2011-09-28 DIAGNOSIS — J309 Allergic rhinitis, unspecified: Secondary | ICD-10-CM

## 2011-10-14 ENCOUNTER — Ambulatory Visit (INDEPENDENT_AMBULATORY_CARE_PROVIDER_SITE_OTHER): Payer: 59

## 2011-10-14 DIAGNOSIS — J309 Allergic rhinitis, unspecified: Secondary | ICD-10-CM

## 2011-10-19 ENCOUNTER — Other Ambulatory Visit: Payer: Self-pay | Admitting: Internal Medicine

## 2011-10-21 ENCOUNTER — Ambulatory Visit (INDEPENDENT_AMBULATORY_CARE_PROVIDER_SITE_OTHER): Payer: 59

## 2011-10-21 DIAGNOSIS — J309 Allergic rhinitis, unspecified: Secondary | ICD-10-CM

## 2011-11-09 ENCOUNTER — Ambulatory Visit (INDEPENDENT_AMBULATORY_CARE_PROVIDER_SITE_OTHER): Payer: 59

## 2011-11-09 DIAGNOSIS — J309 Allergic rhinitis, unspecified: Secondary | ICD-10-CM

## 2011-11-15 ENCOUNTER — Other Ambulatory Visit: Payer: Self-pay | Admitting: Internal Medicine

## 2011-11-16 NOTE — Telephone Encounter (Signed)
Ok to Rf? 

## 2011-11-17 ENCOUNTER — Ambulatory Visit (INDEPENDENT_AMBULATORY_CARE_PROVIDER_SITE_OTHER): Payer: 59 | Admitting: Internal Medicine

## 2011-11-17 ENCOUNTER — Encounter: Payer: Self-pay | Admitting: Internal Medicine

## 2011-11-17 ENCOUNTER — Ambulatory Visit (INDEPENDENT_AMBULATORY_CARE_PROVIDER_SITE_OTHER): Payer: 59

## 2011-11-17 VITALS — BP 124/72 | HR 80 | Temp 97.3°F | Resp 16 | Wt 189.0 lb

## 2011-11-17 DIAGNOSIS — J309 Allergic rhinitis, unspecified: Secondary | ICD-10-CM

## 2011-11-17 DIAGNOSIS — J019 Acute sinusitis, unspecified: Secondary | ICD-10-CM

## 2011-11-17 MED ORDER — AMOXICILLIN-POT CLAVULANATE 875-125 MG PO TABS: 1.0000 | ORAL_TABLET | Freq: Two times a day (BID) | ORAL | Status: DC

## 2011-11-17 MED ORDER — PROMETHAZINE-CODEINE 6.25-10 MG/5ML PO SYRP: 5.0000 mL | ORAL_SOLUTION | ORAL | Status: DC | PRN

## 2011-11-17 NOTE — Patient Instructions (Signed)

## 2011-11-17 NOTE — Progress Notes (Signed)
  Subjective:    Patient ID: Ashley Larsen, female    DOB: [birth year], 61 y.o.   MRN: [id number]  Sinusitis This is a new problem. The current episode started in the past 7 days. The problem has been gradually worsening since onset. There has been no fever. She is experiencing no pain. Associated symptoms include chills, congestion, coughing, sinus pressure and a sore throat. Pertinent negatives include no diaphoresis, ear pain, headaches, hoarse voice, neck pain, shortness of breath, sneezing or swollen glands. Past treatments include oral decongestants. The treatment provided mild relief.      Review of Systems  Constitutional: Positive for chills. Negative for fever, diaphoresis, activity change, appetite change, fatigue and unexpected weight change.  HENT: Positive for nosebleeds, congestion, sore throat, rhinorrhea, postnasal drip and sinus pressure. Negative for ear pain, hoarse voice, facial swelling, sneezing, trouble swallowing, neck pain and voice change.   Eyes: Negative.   Respiratory: Positive for cough. Negative for apnea, choking, chest tightness, shortness of breath, wheezing and stridor.   Cardiovascular: Negative for chest pain, palpitations and leg swelling.  Gastrointestinal: Negative.   Genitourinary: Negative.   Musculoskeletal: Negative.   Neurological: Negative.  Negative for headaches.  Hematological: Negative for adenopathy. Does not bruise/bleed easily.       Objective:   Physical Exam  Vitals reviewed. Constitutional: She appears well-developed and well-nourished.  Non-toxic appearance. She does not have a sickly appearance. She does not appear ill. No distress.  HENT:  Head: Normocephalic and atraumatic. No trismus in the jaw.  Right Ear: Hearing, tympanic membrane, external ear and ear canal normal.  Left Ear: Hearing, tympanic membrane, external ear and ear canal normal.  Nose: Mucosal edema present. No rhinorrhea, nose lacerations, sinus tenderness,  nasal deformity, septal deviation or nasal septal hematoma. No epistaxis.  No foreign bodies. Right sinus exhibits maxillary sinus tenderness. Right sinus exhibits no frontal sinus tenderness. Left sinus exhibits maxillary sinus tenderness. Left sinus exhibits no frontal sinus tenderness.  Mouth/Throat: Oropharynx is clear and moist and mucous membranes are normal. Mucous membranes are not pale, not dry and not cyanotic. No oral lesions. No uvula swelling. No oropharyngeal exudate, posterior oropharyngeal edema, posterior oropharyngeal erythema or tonsillar abscesses.  Eyes: Conjunctivae normal are normal. Right eye exhibits no discharge. Left eye exhibits no discharge. No scleral icterus.  Neck: Normal range of motion. Neck supple. No JVD present. No tracheal deviation present. No thyromegaly present.  Cardiovascular: Normal rate, regular rhythm, normal heart sounds and intact distal pulses.  Exam reveals no gallop and no friction rub.   No murmur heard. Pulmonary/Chest: Effort normal and breath sounds normal. No stridor. No respiratory distress. She has no wheezes. She has no rales. She exhibits no tenderness.  Abdominal: Soft. Bowel sounds are normal. She exhibits no distension and no mass. There is no tenderness. There is no rebound and no guarding.  Musculoskeletal: Normal range of motion.  Lymphadenopathy:    She has no cervical adenopathy.  Skin: Skin is warm and dry. No rash noted. She is not diaphoretic. No erythema. No pallor.          Assessment & Plan:

## 2011-11-17 NOTE — Assessment & Plan Note (Signed)
Start augmentin for the infection and a cough suppressant 

## 2011-11-25 ENCOUNTER — Ambulatory Visit (INDEPENDENT_AMBULATORY_CARE_PROVIDER_SITE_OTHER): Payer: 59

## 2011-11-25 DIAGNOSIS — J309 Allergic rhinitis, unspecified: Secondary | ICD-10-CM

## 2011-12-02 ENCOUNTER — Ambulatory Visit (INDEPENDENT_AMBULATORY_CARE_PROVIDER_SITE_OTHER): Payer: 59

## 2011-12-02 DIAGNOSIS — J309 Allergic rhinitis, unspecified: Secondary | ICD-10-CM

## 2011-12-17 ENCOUNTER — Ambulatory Visit (INDEPENDENT_AMBULATORY_CARE_PROVIDER_SITE_OTHER): Payer: 59

## 2011-12-17 DIAGNOSIS — J309 Allergic rhinitis, unspecified: Secondary | ICD-10-CM

## 2011-12-31 ENCOUNTER — Ambulatory Visit (INDEPENDENT_AMBULATORY_CARE_PROVIDER_SITE_OTHER): Payer: 59

## 2011-12-31 DIAGNOSIS — J309 Allergic rhinitis, unspecified: Secondary | ICD-10-CM

## 2012-01-03 ENCOUNTER — Ambulatory Visit: Payer: 59

## 2012-01-06 ENCOUNTER — Ambulatory Visit (INDEPENDENT_AMBULATORY_CARE_PROVIDER_SITE_OTHER): Payer: 59

## 2012-01-06 DIAGNOSIS — J309 Allergic rhinitis, unspecified: Secondary | ICD-10-CM

## 2012-01-11 ENCOUNTER — Ambulatory Visit (INDEPENDENT_AMBULATORY_CARE_PROVIDER_SITE_OTHER): Payer: 59

## 2012-01-11 DIAGNOSIS — J309 Allergic rhinitis, unspecified: Secondary | ICD-10-CM

## 2012-01-18 ENCOUNTER — Ambulatory Visit (INDEPENDENT_AMBULATORY_CARE_PROVIDER_SITE_OTHER): Payer: 59 | Admitting: Internal Medicine

## 2012-01-18 ENCOUNTER — Encounter: Payer: Self-pay | Admitting: Internal Medicine

## 2012-01-18 VITALS — BP 136/90 | HR 80 | Temp 98.2°F | Resp 16 | Ht 65.5 in | Wt 189.0 lb

## 2012-01-18 DIAGNOSIS — J019 Acute sinusitis, unspecified: Secondary | ICD-10-CM

## 2012-01-18 DIAGNOSIS — Z0000 Encounter for general adult medical examination without abnormal findings: Secondary | ICD-10-CM

## 2012-01-18 DIAGNOSIS — E785 Hyperlipidemia, unspecified: Secondary | ICD-10-CM

## 2012-01-18 DIAGNOSIS — M79609 Pain in unspecified limb: Secondary | ICD-10-CM

## 2012-01-18 DIAGNOSIS — Z23 Encounter for immunization: Secondary | ICD-10-CM

## 2012-01-18 DIAGNOSIS — J449 Chronic obstructive pulmonary disease, unspecified: Secondary | ICD-10-CM

## 2012-01-18 DIAGNOSIS — M25569 Pain in unspecified knee: Secondary | ICD-10-CM

## 2012-01-18 DIAGNOSIS — K219 Gastro-esophageal reflux disease without esophagitis: Secondary | ICD-10-CM

## 2012-01-18 DIAGNOSIS — M25561 Pain in right knee: Secondary | ICD-10-CM

## 2012-01-18 DIAGNOSIS — J4489 Other specified chronic obstructive pulmonary disease: Secondary | ICD-10-CM

## 2012-01-18 DIAGNOSIS — F17200 Nicotine dependence, unspecified, uncomplicated: Secondary | ICD-10-CM

## 2012-01-18 DIAGNOSIS — J45909 Unspecified asthma, uncomplicated: Secondary | ICD-10-CM

## 2012-01-18 MED ORDER — HYDROCODONE-ACETAMINOPHEN 5-325 MG PO TABS: 1.0000 | ORAL_TABLET | Freq: Three times a day (TID) | ORAL | Status: DC | PRN

## 2012-01-18 MED ORDER — HYDROCHLOROTHIAZIDE 12.5 MG PO CAPS: 12.5000 mg | ORAL_CAPSULE | ORAL | Status: DC

## 2012-01-18 MED ORDER — VITAMIN D 1000 UNITS PO TABS: 1000.0000 [IU] | ORAL_TABLET | Freq: Every day | ORAL | Status: DC

## 2012-01-18 MED ORDER — RANITIDINE HCL 150 MG PO TABS: 150.0000 mg | ORAL_TABLET | Freq: Two times a day (BID) | ORAL | Status: DC

## 2012-01-18 MED ORDER — PROMETHAZINE-CODEINE 6.25-10 MG/5ML PO SYRP: 5.0000 mL | ORAL_SOLUTION | ORAL | Status: DC | PRN

## 2012-01-18 NOTE — Progress Notes (Signed)
  Subjective:    Patient ID: Ashley Larsen, female    DOB: 08-12-1950, 61 y.o.   MRN: [id number]  HPI The patient is here for a wellness exam. The patient has been doing well overall without major physical or psychological issues going on lately, except for LBP, knee pain, occ cough  BP Readings from Last 3 Encounters:  01/18/12 136/90  11/17/11 124/72  09/03/11 124/80   Wt Readings from Last 3 Encounters:  01/18/12 189 lb (85.73 kg)  11/17/11 189 lb (85.73 kg)  09/03/11 186 lb (84.369 kg)      Review of Systems  Constitutional: Positive for fatigue. Negative for chills, activity change, appetite change and unexpected weight change.  HENT: Negative for congestion, mouth sores and sinus pressure.   Eyes: Negative for visual disturbance.  Respiratory: Positive for cough. Negative for chest tightness.   Gastrointestinal: Negative for nausea and abdominal pain.       GERD  Genitourinary: Negative for frequency, difficulty urinating and vaginal pain.  Musculoskeletal: Positive for back pain and arthralgias. Negative for gait problem.  Skin: Negative for pallor and rash.  Neurological: Negative for dizziness, tremors, weakness, numbness and headaches.  Psychiatric/Behavioral: Positive for dysphoric mood. Negative for suicidal ideas, confusion and sleep disturbance. The patient is not nervous/anxious.        Objective:   Physical Exam  Constitutional: She appears well-developed. No distress.       Obese  HENT:  Head: Normocephalic.  Right Ear: External ear normal.  Left Ear: External ear normal.  Nose: Nose normal.  Mouth/Throat: Oropharynx is clear and moist.  Eyes: Conjunctivae normal are normal. Pupils are equal, round, and reactive to light. Right eye exhibits no discharge. Left eye exhibits no discharge.  Neck: Normal range of motion. Neck supple. No JVD present. No tracheal deviation present. No thyromegaly present.  Cardiovascular: Normal rate, regular rhythm and normal  heart sounds.   Pulmonary/Chest: No stridor. No respiratory distress. She has no wheezes.  Abdominal: Soft. Bowel sounds are normal. She exhibits no distension and no mass. There is no tenderness. There is no rebound and no guarding.  Musculoskeletal: She exhibits no edema and no tenderness.  Lymphadenopathy:    She has no cervical adenopathy.  Neurological: She displays normal reflexes. No cranial nerve deficit. She exhibits normal muscle tone. Coordination normal.  Skin: No rash noted. No erythema.  Psychiatric: She has a normal mood and affect. Her behavior is normal. Judgment and thought content normal.  B knees, LS spine are tender   Lab Results  Component Value Date   WBC 4.3* 07/01/2010   HGB 13.4 07/01/2010   HCT 38.8 07/01/2010   PLT 264.0 07/01/2010   GLUCOSE 78 07/01/2010   CHOL 289* 08/26/2011   TRIG 272.0* 08/26/2011   HDL 44.00 08/26/2011   LDLDIRECT 178.2 08/26/2011   ALT 19 08/26/2011   AST 19 08/26/2011   NA 141 07/01/2010   K 3.5 07/01/2010   CL 102 07/01/2010   CREATININE 1.0 07/01/2010   BUN 14 07/01/2010   CO2 30 07/01/2010   TSH 1.13 07/01/2010   INR 0.9 01/10/2007   HGBA1C 6.0 08/26/2008        Assessment & Plan:

## 2012-01-18 NOTE — Assessment & Plan Note (Signed)
L LE - knee OA, sciatica

## 2012-01-18 NOTE — Assessment & Plan Note (Signed)
Chronic  She is refusing to take statins

## 2012-01-18 NOTE — Assessment & Plan Note (Signed)
Changed to Ranitidine.

## 2012-01-19 ENCOUNTER — Ambulatory Visit (INDEPENDENT_AMBULATORY_CARE_PROVIDER_SITE_OTHER): Payer: 59

## 2012-01-19 ENCOUNTER — Other Ambulatory Visit (INDEPENDENT_AMBULATORY_CARE_PROVIDER_SITE_OTHER): Payer: 59

## 2012-01-19 ENCOUNTER — Encounter: Payer: Self-pay | Admitting: Internal Medicine

## 2012-01-19 DIAGNOSIS — Z0000 Encounter for general adult medical examination without abnormal findings: Secondary | ICD-10-CM

## 2012-01-19 DIAGNOSIS — M25562 Pain in left knee: Secondary | ICD-10-CM | POA: Insufficient documentation

## 2012-01-19 DIAGNOSIS — J309 Allergic rhinitis, unspecified: Secondary | ICD-10-CM

## 2012-01-19 DIAGNOSIS — M25561 Pain in right knee: Secondary | ICD-10-CM | POA: Insufficient documentation

## 2012-01-19 DIAGNOSIS — J019 Acute sinusitis, unspecified: Secondary | ICD-10-CM

## 2012-01-19 DIAGNOSIS — K219 Gastro-esophageal reflux disease without esophagitis: Secondary | ICD-10-CM

## 2012-01-19 DIAGNOSIS — E785 Hyperlipidemia, unspecified: Secondary | ICD-10-CM

## 2012-01-19 DIAGNOSIS — M79609 Pain in unspecified limb: Secondary | ICD-10-CM

## 2012-01-19 LAB — URINALYSIS
Bilirubin Urine: NEGATIVE
Hgb urine dipstick: NEGATIVE
Nitrite: NEGATIVE
Urobilinogen, UA: 0.2 (ref 0.0–1.0)

## 2012-01-19 LAB — HEPATIC FUNCTION PANEL
ALT: 24 U/L (ref 0–35)
Total Protein: 7.5 g/dL (ref 6.0–8.3)

## 2012-01-19 LAB — CBC WITH DIFFERENTIAL/PLATELET
Basophils Absolute: 0 10*3/uL (ref 0.0–0.1)
Eosinophils Absolute: 0.2 10*3/uL (ref 0.0–0.7)
Lymphocytes Relative: 42.3 % (ref 12.0–46.0)
MCHC: 33.6 g/dL (ref 30.0–36.0)
Neutrophils Relative %: 42.6 % — ABNORMAL LOW (ref 43.0–77.0)
Platelets: 242 10*3/uL (ref 150.0–400.0)
RBC: 4.3 Mil/uL (ref 3.87–5.11)
RDW: 13 % (ref 11.5–14.6)

## 2012-01-19 LAB — LIPID PANEL
Cholesterol: 253 mg/dL — ABNORMAL HIGH (ref 0–200)
HDL: 38.3 mg/dL — ABNORMAL LOW (ref >39.00)
Triglycerides: 153 mg/dL — ABNORMAL HIGH (ref 0.0–149.0)

## 2012-01-19 LAB — BASIC METABOLIC PANEL
CO2: 31 mEq/L (ref 19–32)
Calcium: 9.2 mg/dL (ref 8.4–10.5)
Creatinine, Ser: 0.9 mg/dL (ref 0.4–1.2)

## 2012-01-19 LAB — TSH: TSH: 0.55 u[IU]/mL (ref 0.35–5.50)

## 2012-01-19 LAB — LDL CHOLESTEROL, DIRECT: Direct LDL: 185.7 mg/dL

## 2012-01-19 NOTE — Assessment & Plan Note (Signed)
We discussed age appropriate health related issues, including available/recomended screening tests and vaccinations. We discussed a need for adhering to healthy diet and exercise. Labs/EKG were reviewed/ordered. All questions were answered.   

## 2012-01-19 NOTE — Assessment & Plan Note (Signed)
Continue with current prescription therapy as reflected on the Med list. Needs to stop smoking

## 2012-01-19 NOTE — Assessment & Plan Note (Signed)
Prom- cod cough syr prn

## 2012-01-19 NOTE — Assessment & Plan Note (Signed)
Chronic OA aggravated by her job - standing all day Vicodin, Ibuprofen prn

## 2012-01-19 NOTE — Assessment & Plan Note (Signed)
Discussed.

## 2012-01-21 ENCOUNTER — Telehealth: Payer: Self-pay | Admitting: Internal Medicine

## 2012-01-21 NOTE — Telephone Encounter (Signed)
Left detailed mess informing pt of below.  

## 2012-01-21 NOTE — Telephone Encounter (Signed)
Ashley Larsen, please, inform patient that all labs are normal except for elev chol Thx

## 2012-02-15 ENCOUNTER — Encounter: Payer: Self-pay | Admitting: Internal Medicine

## 2012-02-17 ENCOUNTER — Telehealth: Payer: Self-pay | Admitting: Internal Medicine

## 2012-02-17 NOTE — Telephone Encounter (Signed)
Patient Information:  Caller Name: Carsynn  Phone: [phone]  Patient: Ashley Larsen, Ashley Larsen  Gender: Female  DOB: 21-Sep-1950  Age: 62 Years  PCP: [name], Alex (Adults only)  Office Follow Up:  Does the office need to follow up with this patient?: Yes  Instructions For The Office: asking for any medication for her sinus congestion, no fever  RN Note:  Asking for medication to be called in.  Had same sx last month.  Symptoms  Reason For Call & Symptoms: Having sinus problems again and asking for medication.  Has drainage during the night onto her pillow.  Her ears are stopped up.  No fever.  Reviewed Health History In EMR: Yes  Reviewed Medications In EMR: Yes  Reviewed Allergies In EMR: Yes  Reviewed Surgeries / Procedures: Yes  Date of Onset of Symptoms: 02/14/2012  Treatments Tried: Mucinex DM  Treatments Tried Worked: Yes  Guideline(s) Used:  Sinus Pain and Congestion  Disposition Per Guideline:   Home Care  Reason For Disposition Reached:   Sinus congestion as part of a cold, present < 10 days  Advice Given:  Reassurance:   Sinus congestion is a normal part of a cold.  Here is some care advice that should help.  Call Back If:   Severe pain lasts longer than 2 hours after pain medicine  Fever lasts longer than 3 days

## 2012-02-22 NOTE — Telephone Encounter (Signed)
Agree. Thx 

## 2012-02-23 NOTE — Telephone Encounter (Signed)
Left mess for patient to call back.  

## 2012-03-01 ENCOUNTER — Encounter: Payer: Self-pay | Admitting: Internal Medicine

## 2012-03-01 ENCOUNTER — Ambulatory Visit (INDEPENDENT_AMBULATORY_CARE_PROVIDER_SITE_OTHER): Payer: 59

## 2012-03-01 ENCOUNTER — Ambulatory Visit (INDEPENDENT_AMBULATORY_CARE_PROVIDER_SITE_OTHER): Payer: 59 | Admitting: Internal Medicine

## 2012-03-01 VITALS — BP 122/78 | HR 96 | Temp 98.1°F | Ht 65.5 in | Wt 185.4 lb

## 2012-03-01 DIAGNOSIS — J309 Allergic rhinitis, unspecified: Secondary | ICD-10-CM

## 2012-03-01 DIAGNOSIS — J019 Acute sinusitis, unspecified: Secondary | ICD-10-CM

## 2012-03-01 MED ORDER — AMOXICILLIN-POT CLAVULANATE 875-125 MG PO TABS: 1.0000 | ORAL_TABLET | Freq: Two times a day (BID) | ORAL | Status: DC

## 2012-03-01 MED ORDER — METHYLPREDNISOLONE ACETATE 80 MG/ML IJ SUSP
80.0000 mg | Freq: Once | INTRAMUSCULAR | Status: AC
  Administered 2012-03-01: 80 mg via INTRAMUSCULAR

## 2012-03-01 NOTE — Addendum Note (Signed)
Addended by: Carin Primrose on: 03/01/2012 04:13 PM   Modules accepted: Orders

## 2012-03-01 NOTE — Patient Instructions (Signed)

## 2012-03-01 NOTE — Telephone Encounter (Signed)
Pt still c/o sinus congestion and productive cough. Scheduled OV with Nicki Reaper, NP for today at 3:45.

## 2012-03-01 NOTE — Progress Notes (Signed)
HPI  Pt presents to the clinic today with c/o nasal congestion, sore throat and headache x 2 weeks. She has been using Mucinex and nasal spray without relief. She is unsure if she has been running fevers but she has felt really hot and then really cold. The symptoms will not seem to go away. She does have a history of allergies and is prone to sinus infections. She has had sick contacts.  Review of Systems    Past Medical History  Diagnosis Date  . Asthma   . Allergic rhinitis   . Insomnia   . Paresthesia   . GERD (gastroesophageal reflux disease)   . Diverticulosis   . Ovarian cyst   . Alopecia   . IBS (irritable bowel syndrome)   . Hypertension   . COPD (chronic obstructive pulmonary disease)     Family History  Problem Relation Age of Onset  . Hypertension    . Cancer Sister 33    colon  . Diabetes Sister   . Cancer Brother     brother  . Colon polyps Sister     and Brother  . Cancer Sister 83     colon  . Kidney disease Brother   . Diabetes Brother   . Diabetes Brother   . Heart disease Father   . Other Mother     sclerdermia  . Diabetes Paternal Grandfather   . Colon cancer Sister     History   Social History  . Marital Status: Married    Spouse Name: N/A    Number of Children: N/A  . Years of Education: N/A   Occupational History  . Retail sales [employer]   Social History Main Topics  . Smoking status: Current Every Day Smoker -- 0.3 packs/day for 20 years    Types: Cigarettes  . Smokeless tobacco: Never Used  . Alcohol Use: No  . Drug Use: No  . Sexually Active: Yes   Other Topics Concern  . Not on file   Social History Narrative  . No narrative on file    Allergies  Allergen Reactions  . Metronidazole     REACTION: upset stomach  . Oxycodone-Acetaminophen      Constitutional: Positive headache, fatigue and fever. Denies abrupt weight changes.  HEENT:  Positive eye pain, pressure behind the eyes, facial pain, nasal congestion and  sore throat. Denies eye redness, ear pain, ringing in the ears, wax buildup, runny nose or bloody nose. Respiratory: Positive cough and thick green sputum production. Denies difficulty breathing or shortness of breath.  Cardiovascular: Denies chest pain, chest tightness, palpitations or swelling in the hands or feet.   No other specific complaints in a complete review of systems (except as listed in HPI above).  Objective:    BP 122/78  Pulse 96  Temp 98.1 F (36.7 C) (Oral)  Ht 5' 5.5" (1.664 m)  Wt 185 lb 6.4 oz (84.097 kg)  BMI 30.38 kg/m2  SpO2 97% Wt Readings from Last 3 Encounters:  03/01/12 185 lb 6.4 oz (84.097 kg)  01/18/12 189 lb (85.73 kg)  11/17/11 189 lb (85.73 kg)    General: Appears her stated age, well developed, well nourished in NAD. HEENT: Head: normal shape and size; Eyes: sclera white, no icterus, conjunctiva pink, PERRLA and EOMs intact; Ears: Tm's gray and intact, normal light reflex; Nose: mucosa pink and moist, septum midline; Throat/Mouth: + PND. Teeth present, mucosa pink and moist, no exudate noted, no lesions or ulcerations noted.  Neck: Mild cervical lymphadenopathy. Neck supple, trachea midline. No massses, lumps or thyromegaly present.  Cardiovascular: Normal rate and rhythm. S1,S2 noted.  No murmur, rubs or gallops noted. No JVD or BLE edema. No carotid bruits noted. Pulmonary/Chest: Normal effort and positive vesicular breath sounds. No respiratory distress. No wheezes, rales or ronchi noted.      Assessment & Plan:   Acute bacterial sinusitis  Can use a Neti Pot which can be purchased from your local drug store. Flonase 2 sprays each nostril for 3 days and then as needed. Augmentin BID for 10 days Depo Medrol 80 mg IM today  RTC as needed or if symptoms persist.

## 2012-03-08 ENCOUNTER — Other Ambulatory Visit: Payer: Self-pay | Admitting: Gynecology

## 2012-03-08 ENCOUNTER — Ambulatory Visit (INDEPENDENT_AMBULATORY_CARE_PROVIDER_SITE_OTHER): Payer: 59

## 2012-03-08 DIAGNOSIS — J309 Allergic rhinitis, unspecified: Secondary | ICD-10-CM

## 2012-03-10 ENCOUNTER — Ambulatory Visit (INDEPENDENT_AMBULATORY_CARE_PROVIDER_SITE_OTHER): Payer: 59

## 2012-03-10 DIAGNOSIS — J309 Allergic rhinitis, unspecified: Secondary | ICD-10-CM

## 2012-03-13 ENCOUNTER — Other Ambulatory Visit: Payer: Self-pay | Admitting: Internal Medicine

## 2012-03-14 ENCOUNTER — Ambulatory Visit (INDEPENDENT_AMBULATORY_CARE_PROVIDER_SITE_OTHER): Payer: 59

## 2012-03-14 DIAGNOSIS — J309 Allergic rhinitis, unspecified: Secondary | ICD-10-CM

## 2012-03-15 ENCOUNTER — Ambulatory Visit (INDEPENDENT_AMBULATORY_CARE_PROVIDER_SITE_OTHER): Payer: 59

## 2012-03-15 DIAGNOSIS — J309 Allergic rhinitis, unspecified: Secondary | ICD-10-CM

## 2012-03-21 ENCOUNTER — Ambulatory Visit (INDEPENDENT_AMBULATORY_CARE_PROVIDER_SITE_OTHER): Payer: 59

## 2012-03-21 DIAGNOSIS — J309 Allergic rhinitis, unspecified: Secondary | ICD-10-CM

## 2012-04-04 ENCOUNTER — Ambulatory Visit: Payer: 59

## 2012-04-19 ENCOUNTER — Ambulatory Visit (INDEPENDENT_AMBULATORY_CARE_PROVIDER_SITE_OTHER): Payer: 59

## 2012-04-19 DIAGNOSIS — J309 Allergic rhinitis, unspecified: Secondary | ICD-10-CM

## 2012-04-26 ENCOUNTER — Ambulatory Visit: Payer: 59

## 2012-04-27 ENCOUNTER — Ambulatory Visit (INDEPENDENT_AMBULATORY_CARE_PROVIDER_SITE_OTHER): Payer: 59

## 2012-04-27 DIAGNOSIS — J309 Allergic rhinitis, unspecified: Secondary | ICD-10-CM

## 2012-05-02 ENCOUNTER — Ambulatory Visit: Payer: 59

## 2012-05-11 ENCOUNTER — Other Ambulatory Visit: Payer: Self-pay | Admitting: Internal Medicine

## 2012-05-18 ENCOUNTER — Ambulatory Visit (INDEPENDENT_AMBULATORY_CARE_PROVIDER_SITE_OTHER): Payer: 59

## 2012-05-18 DIAGNOSIS — J309 Allergic rhinitis, unspecified: Secondary | ICD-10-CM

## 2012-06-02 ENCOUNTER — Ambulatory Visit (INDEPENDENT_AMBULATORY_CARE_PROVIDER_SITE_OTHER): Payer: 59 | Admitting: Internal Medicine

## 2012-06-02 ENCOUNTER — Encounter: Payer: Self-pay | Admitting: Internal Medicine

## 2012-06-02 VITALS — BP 116/58 | HR 108 | Temp 98.0°F | Resp 16 | Wt 186.5 lb

## 2012-06-02 DIAGNOSIS — F17200 Nicotine dependence, unspecified, uncomplicated: Secondary | ICD-10-CM

## 2012-06-02 DIAGNOSIS — Z720 Tobacco use: Secondary | ICD-10-CM

## 2012-06-02 DIAGNOSIS — J019 Acute sinusitis, unspecified: Secondary | ICD-10-CM

## 2012-06-02 DIAGNOSIS — J309 Allergic rhinitis, unspecified: Secondary | ICD-10-CM

## 2012-06-02 MED ORDER — METHYLPREDNISOLONE ACETATE 80 MG/ML IJ SUSP
80.0000 mg | Freq: Once | INTRAMUSCULAR | Status: AC
  Administered 2012-06-02: 80 mg via INTRAMUSCULAR

## 2012-06-02 MED ORDER — AMOXICILLIN-POT CLAVULANATE 875-125 MG PO TABS: 1.0000 | ORAL_TABLET | Freq: Two times a day (BID) | ORAL | Status: DC

## 2012-06-02 MED ORDER — FLUTICASONE PROPIONATE 50 MCG/ACT NA SUSP: 2.0000 | Freq: Every day | NASAL | Status: DC

## 2012-06-02 MED ORDER — ALBUTEROL SULFATE HFA 108 (90 BASE) MCG/ACT IN AERS: 2.0000 | INHALATION_SPRAY | Freq: Four times a day (QID) | RESPIRATORY_TRACT | Status: DC | PRN

## 2012-06-02 MED ORDER — FLUCONAZOLE 150 MG PO TABS: 150.0000 mg | ORAL_TABLET | Freq: Once | ORAL | Status: DC

## 2012-06-02 NOTE — Patient Instructions (Signed)
It was good to see you today. Medrol shot given for your congestion symptoms today augmentin antibiotics, nasal nose spray and continue prescription cough syrup -  Also Diflucan tablet to take for yeast symptoms after completion of antibiotic Your prescription(s) have been submitted to your pharmacy. Please take as directed and contact our office if you believe you are having problem(s) with the medication(s). Alternate between ibuprofen and tylenol for aches, pain and fever symptoms as discussed Hydrate, rest and call if worse or unimproved Continue to think about giving up cigarettes! Use nicotine gum, nicotine patches or electronic cigarettes. If you're interested in medication to help you quit, please call  - let me know how I can help!

## 2012-06-02 NOTE — Progress Notes (Signed)
  Subjective:    HPI  complains of head cold and cough symptoms Onset >1 week ago, initially improved then relapsing and worse symptoms  First associated with rhinorrhea, sneezing, sore throat, mild headache and low grade fever Now sinus and chest pressure with mild-mod congestion, yellow-green discharge/sputum No relief with OTC meds Precipitated by sick contacts and weather change  Past Medical History  Diagnosis Date  . Asthma   . Allergic rhinitis   . Insomnia   . Paresthesia   . GERD (gastroesophageal reflux disease)   . Diverticulosis   . Ovarian cyst   . Alopecia   . IBS (irritable bowel syndrome)   . Hypertension   . COPD (chronic obstructive pulmonary disease)     Review of Systems Constitutional: No night sweats, no unexpected weight change Pulmonary: No pleurisy or hemoptysis Cardiovascular: No chest pain or palpitations     Objective:   Physical Exam BP 116/58  Pulse 108  Temp(Src) 98 F (36.7 C) (Oral)  Resp 16  Wt 186 lb 8 oz (84.596 kg)  BMI 30.55 kg/m2  SpO2 94% GEN: mildly ill appearing and audible head/chest congestion HENT: NCAT, mild sinus tenderness bilaterally, nares with thick discharge and turbinate swelling, oropharynx mild erythema and PND, no exudate Eyes: Vision grossly intact, no conjunctivitis Lungs: Clear to auscultation with few rhonchi and occ wheeze, no increased work of breathing at rest or with conversation Cardiovascular: Regular rate and rhythm, no bilateral edema  Lab Results  Component Value Date   WBC 4.4* 01/19/2012   HGB 12.8 01/19/2012   HCT 38.2 01/19/2012   PLT 242.0 01/19/2012   GLUCOSE 103* 01/19/2012   CHOL 253* 01/19/2012   TRIG 153.0* 01/19/2012   HDL 38.30* 01/19/2012   LDLDIRECT 185.7 01/19/2012   ALT 24 01/19/2012   AST 19 01/19/2012   NA 141 01/19/2012   K 3.6 01/19/2012   CL 104 01/19/2012   CREATININE 0.9 01/19/2012   BUN 17 01/19/2012   CO2 31 01/19/2012   TSH 0.55 01/19/2012   INR 0.9 01/10/2007   HGBA1C  6.0 08/26/2008      Assessment & Plan:  acute sinusitis allergic rhinitis  Tobacco abuse Cough, postnasal drip related to above  IM Medrol 80 mg today for inflammation and bronchospasm Empiric antibiotics prescribed due to symptom duration greater than 7 days and progression despite OTC symptomatic care -  also Diflucan for use after completion of Augmentin Continue prescription cough suppression  Renew albuterol rescue inhaler and Flonase spray- new prescriptions done Symptomatic care with Tylenol or Advil, decongestants, antihistamine, hydration and rest -  5 minutes today spent counseling patient on unhealthy effects of continued tobacco abuse and encouragement of cessation including medical options available to help the patient quit smoking.

## 2012-06-08 ENCOUNTER — Ambulatory Visit (INDEPENDENT_AMBULATORY_CARE_PROVIDER_SITE_OTHER): Payer: 59

## 2012-06-08 DIAGNOSIS — J309 Allergic rhinitis, unspecified: Secondary | ICD-10-CM

## 2012-06-15 ENCOUNTER — Ambulatory Visit (INDEPENDENT_AMBULATORY_CARE_PROVIDER_SITE_OTHER): Payer: 59

## 2012-06-15 DIAGNOSIS — J309 Allergic rhinitis, unspecified: Secondary | ICD-10-CM

## 2012-06-22 ENCOUNTER — Ambulatory Visit (INDEPENDENT_AMBULATORY_CARE_PROVIDER_SITE_OTHER): Payer: 59

## 2012-06-22 DIAGNOSIS — J309 Allergic rhinitis, unspecified: Secondary | ICD-10-CM

## 2012-06-28 ENCOUNTER — Ambulatory Visit: Payer: 59

## 2012-07-14 ENCOUNTER — Ambulatory Visit (INDEPENDENT_AMBULATORY_CARE_PROVIDER_SITE_OTHER): Payer: 59

## 2012-07-14 DIAGNOSIS — J309 Allergic rhinitis, unspecified: Secondary | ICD-10-CM

## 2012-07-31 ENCOUNTER — Encounter: Payer: Self-pay | Admitting: Internal Medicine

## 2012-07-31 ENCOUNTER — Ambulatory Visit (INDEPENDENT_AMBULATORY_CARE_PROVIDER_SITE_OTHER): Payer: 59 | Admitting: Internal Medicine

## 2012-07-31 ENCOUNTER — Ambulatory Visit (INDEPENDENT_AMBULATORY_CARE_PROVIDER_SITE_OTHER): Payer: 59

## 2012-07-31 VITALS — BP 142/94 | HR 80 | Temp 98.4°F | Resp 16 | Wt 177.0 lb

## 2012-07-31 DIAGNOSIS — J45909 Unspecified asthma, uncomplicated: Secondary | ICD-10-CM

## 2012-07-31 DIAGNOSIS — J309 Allergic rhinitis, unspecified: Secondary | ICD-10-CM

## 2012-07-31 DIAGNOSIS — M25569 Pain in unspecified knee: Secondary | ICD-10-CM

## 2012-07-31 DIAGNOSIS — M25561 Pain in right knee: Secondary | ICD-10-CM

## 2012-07-31 DIAGNOSIS — E785 Hyperlipidemia, unspecified: Secondary | ICD-10-CM

## 2012-07-31 MED ORDER — PROMETHAZINE-CODEINE 6.25-10 MG/5ML PO SYRP: ORAL_SOLUTION | ORAL | Status: DC

## 2012-07-31 MED ORDER — HYDROCODONE-ACETAMINOPHEN 5-325 MG PO TABS: 1.0000 | ORAL_TABLET | Freq: Three times a day (TID) | ORAL | Status: DC | PRN

## 2012-07-31 NOTE — Assessment & Plan Note (Signed)
Continue with current prescription therapy as reflected on the Med list. Prom-cod prn Stop smoking

## 2012-07-31 NOTE — Progress Notes (Signed)
  Subjective:    HPI F/u allergies - worse, LBP - better, knee pain, occ cough Nl BP at home  BP Readings from Last 3 Encounters:  07/31/12 142/94  06/02/12 116/58  03/01/12 122/78   Wt Readings from Last 3 Encounters:  07/31/12 177 lb (80.287 kg)  06/02/12 186 lb 8 oz (84.596 kg)  03/01/12 185 lb 6.4 oz (84.097 kg)      Review of Systems  Constitutional: Positive for fatigue. Negative for chills, activity change, appetite change and unexpected weight change.  HENT: Positive for congestion and rhinorrhea. Negative for mouth sores and sinus pressure.   Eyes: Negative for visual disturbance.  Respiratory: Positive for cough. Negative for chest tightness.   Gastrointestinal: Negative for nausea and abdominal pain.       GERD  Genitourinary: Negative for frequency, difficulty urinating and vaginal pain.  Musculoskeletal: Positive for back pain and arthralgias. Negative for gait problem.  Skin: Negative for pallor and rash.  Neurological: Negative for dizziness, tremors, weakness, numbness and headaches.  Psychiatric/Behavioral: Positive for dysphoric mood. Negative for suicidal ideas, confusion and sleep disturbance. The patient is not nervous/anxious.        Objective:   Physical Exam  Constitutional: She appears well-developed. No distress.  Obese  HENT:  Head: Normocephalic.  Right Ear: External ear normal.  Left Ear: External ear normal.  Mouth/Throat: Oropharynx is clear and moist.  Swollen nasal mucosa  Eyes: Conjunctivae are normal. Pupils are equal, round, and reactive to light. Right eye exhibits no discharge. Left eye exhibits no discharge.  Neck: Normal range of motion. Neck supple. No JVD present. No tracheal deviation present. No thyromegaly present.  Cardiovascular: Normal rate, regular rhythm and normal heart sounds.   Pulmonary/Chest: No stridor. No respiratory distress. She has no wheezes.  Abdominal: Soft. Bowel sounds are normal. She exhibits no  distension and no mass. There is no tenderness. There is no rebound and no guarding.  Musculoskeletal: She exhibits no edema and no tenderness.  Lymphadenopathy:    She has no cervical adenopathy.  Neurological: She displays normal reflexes. No cranial nerve deficit. She exhibits normal muscle tone. Coordination normal.  Skin: No rash noted. No erythema.  Psychiatric: She has a normal mood and affect. Her behavior is normal. Judgment and thought content normal.     Lab Results  Component Value Date   WBC 4.4* 01/19/2012   HGB 12.8 01/19/2012   HCT 38.2 01/19/2012   PLT 242.0 01/19/2012   GLUCOSE 103* 01/19/2012   CHOL 253* 01/19/2012   TRIG 153.0* 01/19/2012   HDL 38.30* 01/19/2012   LDLDIRECT 185.7 01/19/2012   ALT 24 01/19/2012   AST 19 01/19/2012   NA 141 01/19/2012   K 3.6 01/19/2012   CL 104 01/19/2012   CREATININE 0.9 01/19/2012   BUN 17 01/19/2012   CO2 31 01/19/2012   TSH 0.55 01/19/2012   INR 0.9 01/10/2007   HGBA1C 6.0 08/26/2008        Assessment & Plan:

## 2012-07-31 NOTE — Assessment & Plan Note (Signed)
Continue with current prn prescription therapy as reflected on the Med list.  

## 2012-07-31 NOTE — Assessment & Plan Note (Signed)
  On diet  

## 2012-08-11 ENCOUNTER — Ambulatory Visit (INDEPENDENT_AMBULATORY_CARE_PROVIDER_SITE_OTHER): Payer: 59

## 2012-08-11 DIAGNOSIS — J309 Allergic rhinitis, unspecified: Secondary | ICD-10-CM

## 2012-08-25 ENCOUNTER — Ambulatory Visit (INDEPENDENT_AMBULATORY_CARE_PROVIDER_SITE_OTHER): Payer: 59

## 2012-08-25 DIAGNOSIS — J309 Allergic rhinitis, unspecified: Secondary | ICD-10-CM

## 2012-09-19 ENCOUNTER — Ambulatory Visit (INDEPENDENT_AMBULATORY_CARE_PROVIDER_SITE_OTHER): Payer: 59

## 2012-09-19 DIAGNOSIS — J309 Allergic rhinitis, unspecified: Secondary | ICD-10-CM

## 2012-09-26 ENCOUNTER — Ambulatory Visit: Payer: 59

## 2012-10-05 ENCOUNTER — Ambulatory Visit (INDEPENDENT_AMBULATORY_CARE_PROVIDER_SITE_OTHER): Payer: 59

## 2012-10-05 DIAGNOSIS — J309 Allergic rhinitis, unspecified: Secondary | ICD-10-CM

## 2012-10-20 ENCOUNTER — Ambulatory Visit (INDEPENDENT_AMBULATORY_CARE_PROVIDER_SITE_OTHER): Payer: 59

## 2012-10-20 DIAGNOSIS — J309 Allergic rhinitis, unspecified: Secondary | ICD-10-CM

## 2012-10-27 ENCOUNTER — Ambulatory Visit: Payer: 59

## 2012-10-31 ENCOUNTER — Telehealth: Payer: Self-pay | Admitting: *Deleted

## 2012-10-31 ENCOUNTER — Ambulatory Visit (INDEPENDENT_AMBULATORY_CARE_PROVIDER_SITE_OTHER): Payer: 59

## 2012-10-31 DIAGNOSIS — J309 Allergic rhinitis, unspecified: Secondary | ICD-10-CM

## 2012-10-31 NOTE — Telephone Encounter (Signed)
Pt came in and filled out a walk in sheet. She c/o back pain over her left kidney x 2 1/2 weeks. I called pt and scheduled OV for tom 11/01/12 at 3:45.

## 2012-11-01 ENCOUNTER — Ambulatory Visit (INDEPENDENT_AMBULATORY_CARE_PROVIDER_SITE_OTHER)
Admission: RE | Admit: 2012-11-01 | Discharge: 2012-11-01 | Disposition: A | Discharge status: Home / Self Care | Payer: 59 | Source: Ambulatory Visit | Attending: Internal Medicine | Admitting: Internal Medicine

## 2012-11-01 ENCOUNTER — Other Ambulatory Visit (INDEPENDENT_AMBULATORY_CARE_PROVIDER_SITE_OTHER): Payer: 59

## 2012-11-01 ENCOUNTER — Ambulatory Visit (INDEPENDENT_AMBULATORY_CARE_PROVIDER_SITE_OTHER): Payer: 59 | Admitting: Internal Medicine

## 2012-11-01 ENCOUNTER — Encounter: Payer: Self-pay | Admitting: Internal Medicine

## 2012-11-01 VITALS — BP 134/82 | HR 96 | Temp 97.6°F | Resp 20 | Wt 174.0 lb

## 2012-11-01 DIAGNOSIS — M533 Sacrococcygeal disorders, not elsewhere classified: Secondary | ICD-10-CM

## 2012-11-01 DIAGNOSIS — M545 Low back pain, unspecified: Secondary | ICD-10-CM

## 2012-11-01 DIAGNOSIS — G8929 Other chronic pain: Secondary | ICD-10-CM | POA: Insufficient documentation

## 2012-11-01 LAB — URINALYSIS
Bilirubin Urine: NEGATIVE
Hgb urine dipstick: NEGATIVE
Ketones, ur: NEGATIVE
Leukocytes, UA: NEGATIVE
Specific Gravity, Urine: 1.025 (ref 1.000–1.030)
Urine Glucose: NEGATIVE
Urobilinogen, UA: 0.2 (ref 0.0–1.0)

## 2012-11-01 MED ORDER — HYDROCODONE-ACETAMINOPHEN 5-325 MG PO TABS: 1.0000 | ORAL_TABLET | Freq: Three times a day (TID) | ORAL | Status: DC | PRN

## 2012-11-01 MED ORDER — MELOXICAM 15 MG PO TABS: ORAL_TABLET | ORAL | Status: DC

## 2012-11-01 NOTE — Assessment & Plan Note (Signed)
Meloxicam Hydroc/APAP ROM exercises

## 2012-11-01 NOTE — Patient Instructions (Signed)
Look up exercises for SI joint pain

## 2012-11-01 NOTE — Assessment & Plan Note (Signed)
UA

## 2012-11-01 NOTE — Progress Notes (Signed)
Subjective:    Back Pain This is a new problem. The current episode started 1 to 4 weeks ago (3 wks). The pain is present in the lumbar spine (L LS). The quality of the pain is described as stabbing. The pain does not radiate. The pain is at a severity of 8/10. The pain is severe. The pain is worse during the day. The symptoms are aggravated by sitting, standing and twisting. Pertinent negatives include no abdominal pain, headaches, numbness or weakness. She has tried analgesics for the symptoms. The treatment provided no relief.    C/o L LBP F/u allergies - worse, LBP - better, knee pain, occ cough Nl BP at home  BP Readings from Last 3 Encounters:  11/01/12 134/82  07/31/12 142/94  06/02/12 116/58   Wt Readings from Last 3 Encounters:  11/01/12 174 lb (78.926 kg)  07/31/12 177 lb (80.287 kg)  06/02/12 186 lb 8 oz (84.596 kg)      Review of Systems  Constitutional: Positive for fatigue. Negative for chills, activity change, appetite change and unexpected weight change.  HENT: Positive for congestion and rhinorrhea. Negative for mouth sores and sinus pressure.   Eyes: Negative for visual disturbance.  Respiratory: Positive for cough. Negative for chest tightness.   Gastrointestinal: Negative for nausea and abdominal pain.       GERD  Genitourinary: Negative for frequency, difficulty urinating and vaginal pain.  Musculoskeletal: Positive for back pain and arthralgias. Negative for gait problem.  Skin: Negative for pallor and rash.  Neurological: Negative for dizziness, tremors, weakness, numbness and headaches.  Psychiatric/Behavioral: Positive for dysphoric mood. Negative for suicidal ideas, confusion and sleep disturbance. The patient is not nervous/anxious.        Objective:   Physical Exam  Constitutional: She appears well-developed. No distress.  Obese  HENT:  Head: Normocephalic.  Right Ear: External ear normal.  Left Ear: External ear normal.  Mouth/Throat:  Oropharynx is clear and moist.  Swollen nasal mucosa  Eyes: Conjunctivae are normal. Pupils are equal, round, and reactive to light. Right eye exhibits no discharge. Left eye exhibits no discharge.  Neck: Normal range of motion. Neck supple. No JVD present. No tracheal deviation present. No thyromegaly present.  Cardiovascular: Normal rate, regular rhythm and normal heart sounds.   Pulmonary/Chest: No stridor. No respiratory distress. She has no wheezes.  Abdominal: Soft. Bowel sounds are normal. She exhibits no distension and no mass. There is no tenderness. There is no rebound and no guarding.  Musculoskeletal: She exhibits no edema and no tenderness.  Lymphadenopathy:    She has no cervical adenopathy.  Neurological: She displays normal reflexes. No cranial nerve deficit. She exhibits normal muscle tone. Coordination normal.  Skin: No rash noted. No erythema.  Psychiatric: She has a normal mood and affect. Her behavior is normal. Judgment and thought content normal.     Lab Results  Component Value Date   WBC 4.4* 01/19/2012   HGB 12.8 01/19/2012   HCT 38.2 01/19/2012   PLT 242.0 01/19/2012   GLUCOSE 103* 01/19/2012   CHOL 253* 01/19/2012   TRIG 153.0* 01/19/2012   HDL 38.30* 01/19/2012   LDLDIRECT 185.7 01/19/2012   ALT 24 01/19/2012   AST 19 01/19/2012   NA 141 01/19/2012   K 3.6 01/19/2012   CL 104 01/19/2012   CREATININE 0.9 01/19/2012   BUN 17 01/19/2012   CO2 31 01/19/2012   TSH 0.55 01/19/2012   INR 0.9 01/10/2007   HGBA1C 6.0 08/26/2008  Assessment & Plan:

## 2012-11-10 ENCOUNTER — Ambulatory Visit: Payer: 59

## 2012-11-17 ENCOUNTER — Ambulatory Visit (INDEPENDENT_AMBULATORY_CARE_PROVIDER_SITE_OTHER): Payer: 59

## 2012-11-17 DIAGNOSIS — J309 Allergic rhinitis, unspecified: Secondary | ICD-10-CM

## 2012-11-23 ENCOUNTER — Other Ambulatory Visit: Payer: Self-pay

## 2012-11-23 DIAGNOSIS — Z1231 Encounter for screening mammogram for malignant neoplasm of breast: Secondary | ICD-10-CM

## 2012-12-01 ENCOUNTER — Ambulatory Visit
Admission: RE | Admit: 2012-12-01 | Discharge: 2012-12-01 | Disposition: A | Discharge status: Home / Self Care | Payer: 59 | Source: Ambulatory Visit

## 2012-12-01 ENCOUNTER — Ambulatory Visit (INDEPENDENT_AMBULATORY_CARE_PROVIDER_SITE_OTHER): Payer: 59

## 2012-12-01 DIAGNOSIS — J309 Allergic rhinitis, unspecified: Secondary | ICD-10-CM

## 2012-12-01 DIAGNOSIS — Z1231 Encounter for screening mammogram for malignant neoplasm of breast: Secondary | ICD-10-CM

## 2012-12-05 ENCOUNTER — Ambulatory Visit (INDEPENDENT_AMBULATORY_CARE_PROVIDER_SITE_OTHER): Payer: 59

## 2012-12-05 ENCOUNTER — Ambulatory Visit (INDEPENDENT_AMBULATORY_CARE_PROVIDER_SITE_OTHER): Payer: 59 | Admitting: Internal Medicine

## 2012-12-05 ENCOUNTER — Ambulatory Visit (INDEPENDENT_AMBULATORY_CARE_PROVIDER_SITE_OTHER)
Admission: RE | Admit: 2012-12-05 | Discharge: 2012-12-05 | Disposition: A | Discharge status: Home / Self Care | Payer: 59 | Source: Ambulatory Visit | Attending: Internal Medicine | Admitting: Internal Medicine

## 2012-12-05 ENCOUNTER — Encounter: Payer: Self-pay | Admitting: Internal Medicine

## 2012-12-05 VITALS — BP 112/68 | HR 91 | Ht 65.5 in | Wt 172.6 lb

## 2012-12-05 DIAGNOSIS — Z720 Tobacco use: Secondary | ICD-10-CM

## 2012-12-05 DIAGNOSIS — H6983 Other specified disorders of Eustachian tube, bilateral: Secondary | ICD-10-CM

## 2012-12-05 DIAGNOSIS — J310 Chronic rhinitis: Secondary | ICD-10-CM

## 2012-12-05 DIAGNOSIS — H6980 Other specified disorders of Eustachian tube, unspecified ear: Secondary | ICD-10-CM

## 2012-12-05 DIAGNOSIS — F17200 Nicotine dependence, unspecified, uncomplicated: Secondary | ICD-10-CM

## 2012-12-05 DIAGNOSIS — J309 Allergic rhinitis, unspecified: Secondary | ICD-10-CM

## 2012-12-05 NOTE — Patient Instructions (Signed)
Order- CXR- dx tobacco user  Please keep trying to stop smoking. You know it can't be good for you !  [facility]'t forget [facility]  We can continue allergy vaccine 1:10 [facility]  Please call as needed

## 2012-12-05 NOTE — Progress Notes (Signed)
Subjective:    Patient ID: Ashley Larsen, female    DOB: [birth year], 62 y.o.   MRN: [id number] HPI  10/20/10 - 60 yof smoker followed for COPD, tobacco use, allergic rhinitis, complicated by insomnia, GERD, HBP  Last here July 28, 2009-  Still smoking 6-7 cigarettes/ day. She had a friend who quit with hypnosis so she would like to try that. Wants flu shot.  Rhinitis in last 2 weeks with nasal congestion and pressure- not headache or green.  Denies dyspnea, chest pain, swelling. Husband has liver cancer.  Never needing her inhalers- would like to keep a rescue inhaler available   09/03/11- Dr Shelle Iron The patient comes in today for an acute sick visit.  She is normally followed by Dr. Maple Hudson for COPD.  A review of the chart shows that she has required multiple rounds of antibiotics this year so far, and unfortunately continues to smoke every day.  She denies noncompliance with her symbicort.  She notes a one-week history of worsening nasal congestion, postnasal drip, sinus pressure, and small-to-moderate quantities of purulent mucus from her nose.  She has also had a sore throat.  She denies any significant chest congestion, and feels that her breathing is at baseline.  12/05/12- 60 yof smoker followed for COPD, tobacco use, allergic rhinitis, complicated by insomnia, GERD, HBP FOLLOWS FOR: c/o nasal congestion-no color,sob with exertion,cough-clear,denies wheezing cp or tightness Says she can tell if she skips her allergy shot, continuing Allergy Vaccine 1:10 GH. Some nasal congestion and pressure in her years, mild cough at night. Rarely needs rescue inhaler. [facility]  ROS-see HPI Constitutional:   No-   weight loss, night sweats, fevers, chills, fatigue, lassitude. HEENT:   No-  headaches, difficulty swallowing, tooth/dental problems, sore throat,       No-  sneezing, itching, ear ache, +nasal congestion, post nasal drip,  CV:  No-   chest pain, orthopnea, PND,  swelling in lower extremities, anasarca,  dizziness, palpitations Resp: No-   shortness of breath with exertion or at rest.              No-   productive cough,  No non-productive cough,  No- coughing up of blood.              No-   change in color of mucus.  No- wheezing.   Skin: No-   rash or lesions. GI:  No-   heartburn, indigestion, abdominal pain, nausea, vomiting,  GU: . MS:  No-   joint pain or swelling.   Neuro-     nothing unusual Psych:  No- change in mood or affect. No depression or anxiety.  No memory loss.  Objective:  OBJ- Physical Exam General- Alert, Oriented, Affect-appropriate, Distress- none acute Skin- rash-none, lesions- none, excoriation- none Lymphadenopathy- none Head- atraumatic            Eyes- Gross vision intact, PERRLA, conjunctivae and secretions clear            Ears- Hearing, canals, TMs-normal            Nose- +turbinate edema, no-Septal dev, mucus, polyps, erosion, perforation             Throat- Mallampati II , mucosa clear , drainage- none, tonsils- atrophic Neck- flexible , trachea midline, no stridor , thyroid nl, carotid no bruit Chest - symmetrical excursion , unlabored           Heart/CV- RRR , no murmur , no  gallop  , no rub, nl s1 s2                           - JVD- none , edema- none, stasis changes- none, varices- none           Lung- clear to P&A, wheeze- none, cough- none , dullness-none, rub- none           Chest wall-  Abd- Br/ Gen/ Rectal- Not done, not indicated Extrem- cyanosis- none, clubbing, none, atrophy- none, strength- nl Neuro- grossly intact to observation    Assessment & Plan:

## 2012-12-06 NOTE — Progress Notes (Signed)
Quick Note:  Patient returned call. Advised of xray results / recs as stated by CY. Pt verbalized understanding and denied any questions. ______

## 2012-12-08 ENCOUNTER — Ambulatory Visit (INDEPENDENT_AMBULATORY_CARE_PROVIDER_SITE_OTHER): Payer: 59

## 2012-12-08 ENCOUNTER — Ambulatory Visit: Payer: 59

## 2012-12-08 DIAGNOSIS — Z23 Encounter for immunization: Secondary | ICD-10-CM

## 2012-12-15 ENCOUNTER — Ambulatory Visit: Payer: 59

## 2012-12-20 ENCOUNTER — Ambulatory Visit (INDEPENDENT_AMBULATORY_CARE_PROVIDER_SITE_OTHER): Payer: 59

## 2012-12-20 DIAGNOSIS — J309 Allergic rhinitis, unspecified: Secondary | ICD-10-CM

## 2012-12-22 ENCOUNTER — Other Ambulatory Visit: Payer: Self-pay | Admitting: Internal Medicine

## 2012-12-23 NOTE — Assessment & Plan Note (Signed)
I reminded her that cigarette smoke may be the most important ears and for her upper airway

## 2012-12-23 NOTE — Assessment & Plan Note (Signed)
Acute exacerbation with eustachian dysfunction. Probably viral

## 2012-12-23 NOTE — Assessment & Plan Note (Signed)
Sense of pressure in the ears consistent with eustachian dysfunction related to current URI

## 2012-12-28 ENCOUNTER — Other Ambulatory Visit: Payer: Self-pay | Admitting: Internal Medicine

## 2013-01-01 ENCOUNTER — Telehealth: Payer: Self-pay

## 2013-01-01 NOTE — Telephone Encounter (Signed)
Also, Rf req for Prometh-codeine syrup 1 tsp po q 4 hr prn. Ok to Rf?

## 2013-01-01 NOTE — Telephone Encounter (Signed)
Fax received from pharmacy requesting refill for Hydrocodone. Patient was last seen on 10/27/2012 and medication ws last filled on 11/01/2012.  Please advise,  Thanks!

## 2013-01-02 MED ORDER — PROMETHAZINE-CODEINE 6.25-10 MG/5ML PO SYRP: ORAL_SOLUTION | ORAL | Status: DC

## 2013-01-02 MED ORDER — HYDROCODONE-ACETAMINOPHEN 5-325 MG PO TABS: 1.0000 | ORAL_TABLET | Freq: Three times a day (TID) | ORAL | Status: DC | PRN

## 2013-01-02 NOTE — Telephone Encounter (Signed)
Ok Thx 

## 2013-01-02 NOTE — Telephone Encounter (Signed)
Rxs printed/pending MD sig.

## 2013-01-02 NOTE — Telephone Encounter (Signed)
Rx ready for p/u. Left detailed mess informing pt.  

## 2013-01-04 ENCOUNTER — Ambulatory Visit (INDEPENDENT_AMBULATORY_CARE_PROVIDER_SITE_OTHER): Payer: 59

## 2013-01-04 DIAGNOSIS — J309 Allergic rhinitis, unspecified: Secondary | ICD-10-CM

## 2013-01-10 ENCOUNTER — Ambulatory Visit: Payer: 59

## 2013-01-17 ENCOUNTER — Encounter: Payer: Self-pay | Admitting: Internal Medicine

## 2013-01-31 ENCOUNTER — Ambulatory Visit (INDEPENDENT_AMBULATORY_CARE_PROVIDER_SITE_OTHER): Payer: 59

## 2013-01-31 ENCOUNTER — Ambulatory Visit (INDEPENDENT_AMBULATORY_CARE_PROVIDER_SITE_OTHER)
Admission: RE | Admit: 2013-01-31 | Discharge: 2013-01-31 | Disposition: A | Discharge status: Home / Self Care | Payer: 59 | Source: Ambulatory Visit | Attending: Internal Medicine | Admitting: Internal Medicine

## 2013-01-31 ENCOUNTER — Ambulatory Visit (INDEPENDENT_AMBULATORY_CARE_PROVIDER_SITE_OTHER): Payer: 59 | Admitting: Internal Medicine

## 2013-01-31 ENCOUNTER — Encounter: Payer: Self-pay | Admitting: Internal Medicine

## 2013-01-31 ENCOUNTER — Ambulatory Visit (INDEPENDENT_AMBULATORY_CARE_PROVIDER_SITE_OTHER): Payer: 59 | Admitting: *Deleted

## 2013-01-31 VITALS — BP 152/90 | Temp 98.0°F | Ht 65.5 in | Wt 159.0 lb

## 2013-01-31 DIAGNOSIS — R7989 Other specified abnormal findings of blood chemistry: Secondary | ICD-10-CM | POA: Insufficient documentation

## 2013-01-31 DIAGNOSIS — R1031 Right lower quadrant pain: Secondary | ICD-10-CM

## 2013-01-31 DIAGNOSIS — D649 Anemia, unspecified: Secondary | ICD-10-CM | POA: Insufficient documentation

## 2013-01-31 DIAGNOSIS — R946 Abnormal results of thyroid function studies: Secondary | ICD-10-CM

## 2013-01-31 DIAGNOSIS — Z23 Encounter for immunization: Secondary | ICD-10-CM

## 2013-01-31 DIAGNOSIS — R634 Abnormal weight loss: Secondary | ICD-10-CM

## 2013-01-31 DIAGNOSIS — Z0000 Encounter for general adult medical examination without abnormal findings: Secondary | ICD-10-CM

## 2013-01-31 DIAGNOSIS — E876 Hypokalemia: Secondary | ICD-10-CM

## 2013-01-31 DIAGNOSIS — J309 Allergic rhinitis, unspecified: Secondary | ICD-10-CM

## 2013-01-31 LAB — CBC WITH DIFFERENTIAL/PLATELET
Basophils Absolute: 0 10*3/uL (ref 0.0–0.1)
HCT: 32.3 % — ABNORMAL LOW (ref 36.0–46.0)
Hemoglobin: 10.9 g/dL — ABNORMAL LOW (ref 12.0–15.0)
Lymphs Abs: 1.3 10*3/uL (ref 0.7–4.0)
MCHC: 33.6 g/dL (ref 30.0–36.0)
MCV: 81.5 fl (ref 78.0–100.0)
Monocytes Absolute: 0.5 10*3/uL (ref 0.1–1.0)
Neutro Abs: 1.6 10*3/uL (ref 1.4–7.7)
Platelets: 185 10*3/uL (ref 150.0–400.0)
RDW: 12.5 % (ref 11.5–14.6)

## 2013-01-31 LAB — URINALYSIS, ROUTINE W REFLEX MICROSCOPIC
Hgb urine dipstick: NEGATIVE
Ketones, ur: NEGATIVE
Nitrite: NEGATIVE
Specific Gravity, Urine: 1.01 (ref 1.000–1.030)
Urine Glucose: NEGATIVE
Urobilinogen, UA: 0.2 (ref 0.0–1.0)
pH: 6 (ref 5.0–8.0)

## 2013-01-31 LAB — BASIC METABOLIC PANEL
BUN: 14 mg/dL (ref 6–23)
CO2: 30 mEq/L (ref 19–32)
Calcium: 10.5 mg/dL (ref 8.4–10.5)
Creatinine, Ser: 0.5 mg/dL (ref 0.4–1.2)
GFR: 165.39 mL/min (ref >60.00)
Potassium: 3 mEq/L — ABNORMAL LOW (ref 3.5–5.1)
Sodium: 142 mEq/L (ref 135–145)

## 2013-01-31 LAB — LIPID PANEL
LDL Cholesterol: 60 mg/dL (ref 0–99)
Total CHOL/HDL Ratio: 3
Triglycerides: 89 mg/dL (ref 0.0–149.0)
VLDL: 17.8 mg/dL (ref 0.0–40.0)

## 2013-01-31 LAB — HEPATIC FUNCTION PANEL
AST: 37 U/L (ref 0–37)
Albumin: 3.8 g/dL (ref 3.5–5.2)
Alkaline Phosphatase: 99 U/L (ref 39–117)
Bilirubin, Direct: 0.2 mg/dL (ref 0.0–0.3)

## 2013-01-31 MED ORDER — HYDROCODONE-ACETAMINOPHEN 5-325 MG PO TABS: 1.0000 | ORAL_TABLET | Freq: Three times a day (TID) | ORAL | Status: DC | PRN

## 2013-01-31 MED ORDER — POTASSIUM CHLORIDE ER 10 MEQ PO TBCR: 10.0000 meq | EXTENDED_RELEASE_TABLET | Freq: Two times a day (BID) | ORAL | Status: DC

## 2013-01-31 MED ORDER — IOHEXOL 300 MG/ML  SOLN
100.0000 mL | Freq: Once | INTRAMUSCULAR | Status: AC | PRN
  Administered 2013-01-31: 100 mL via INTRAVENOUS

## 2013-01-31 MED ORDER — ALBUTEROL SULFATE HFA 108 (90 BASE) MCG/ACT IN AERS: 2.0000 | INHALATION_SPRAY | Freq: Four times a day (QID) | RESPIRATORY_TRACT | Status: DC | PRN

## 2013-01-31 MED ORDER — PANTOPRAZOLE SODIUM 40 MG PO TBEC: 40.0000 mg | DELAYED_RELEASE_TABLET | Freq: Every day | ORAL | Status: DC

## 2013-01-31 MED ORDER — DICLOFENAC SODIUM 1 % TD GEL: 4.0000 g | Freq: Four times a day (QID) | TRANSDERMAL | Status: DC

## 2013-01-31 NOTE — Assessment & Plan Note (Signed)
12/14 new - r/o PUD Iron studies D/c Meloxicam Protonix to start May need a GI referral

## 2013-01-31 NOTE — Assessment & Plan Note (Addendum)
Labs abd CT

## 2013-01-31 NOTE — Assessment & Plan Note (Signed)
We discussed age appropriate health related issues, including available/recomended screening tests and vaccinations. We discussed a need for adhering to healthy diet and exercise. Labs/EKG were reviewed/ordered. All questions were answered.   

## 2013-01-31 NOTE — Assessment & Plan Note (Signed)
Get FT4 May need Endo referral

## 2013-01-31 NOTE — Assessment & Plan Note (Addendum)
Labs CT abd 

## 2013-01-31 NOTE — Progress Notes (Signed)
Pre visit review using our clinic review tool, if applicable. No additional management support is needed unless otherwise documented below in the visit note. 

## 2013-01-31 NOTE — Progress Notes (Signed)
Subjective:   The patient is here for a wellness exam. C/o wt loss and abd bloating x 2 months  Back Pain This is a recurrent problem. The current episode started 1 to 4 weeks ago (3 wks). The problem has been waxing and waning since onset. The pain is present in the lumbar spine (L LS). The quality of the pain is described as stabbing. The pain does not radiate. The pain is at a severity of 8/10. The pain is moderate. The pain is worse during the day. The symptoms are aggravated by sitting, standing and twisting. Pertinent negatives include no abdominal pain, headaches, numbness or weakness. She has tried analgesics for the symptoms. The treatment provided significant relief.  Abdominal Pain This is a new problem. The current episode started more than 1 month ago. The onset quality is gradual. The problem occurs intermittently. The problem has been unchanged. The pain is located in the RLQ. The pain is moderate. The quality of the pain is sharp. Associated symptoms include arthralgias. Pertinent negatives include no frequency, headaches or nausea. The pain is relieved by activity. The treatment provided no relief. There is no history of colon cancer.    C/o L LBP F/u allergies - worse, LBP - better, knee pain, occ cough Nl BP at home  BP Readings from Last 3 Encounters:  01/31/13 152/90  12/05/12 112/68  11/01/12 134/82   Wt Readings from Last 3 Encounters:  01/31/13 159 lb (72.122 kg)  12/05/12 172 lb 9.6 oz (78.291 kg)  11/01/12 174 lb (78.926 kg)      Review of Systems  Constitutional: Positive for fatigue. Negative for chills, activity change, appetite change and unexpected weight change.  HENT: Positive for congestion and rhinorrhea. Negative for mouth sores and sinus pressure.   Eyes: Negative for visual disturbance.  Respiratory: Positive for cough. Negative for chest tightness.   Gastrointestinal: Negative for nausea and abdominal pain.       GERD  Genitourinary:  Negative for frequency, difficulty urinating and vaginal pain.  Musculoskeletal: Positive for arthralgias and back pain. Negative for gait problem.  Skin: Negative for pallor and rash.  Neurological: Negative for dizziness, tremors, weakness, numbness and headaches.  Psychiatric/Behavioral: Positive for dysphoric mood. Negative for suicidal ideas, confusion and sleep disturbance. The patient is not nervous/anxious.        Objective:   Physical Exam  Constitutional: She appears well-developed. No distress.  Obese  HENT:  Head: Normocephalic.  Right Ear: External ear normal.  Left Ear: External ear normal.  Mouth/Throat: Oropharynx is clear and moist.  Swollen nasal mucosa  Eyes: Conjunctivae are normal. Pupils are equal, round, and reactive to light. Right eye exhibits no discharge. Left eye exhibits no discharge.  Neck: Normal range of motion. Neck supple. No JVD present. No tracheal deviation present. Thyromegaly (mild, NT) present.  Cardiovascular: Normal rate, regular rhythm and normal heart sounds.   Pulmonary/Chest: No stridor. No respiratory distress. She has no wheezes.  Abdominal: Soft. Bowel sounds are normal. She exhibits distension. She exhibits no mass. There is tenderness (diffuse). There is no rebound and no guarding.  Musculoskeletal: She exhibits no edema and no tenderness.  Lymphadenopathy:    She has no cervical adenopathy.  Neurological: She displays normal reflexes. No cranial nerve deficit. She exhibits normal muscle tone. Coordination normal.  Skin: No rash noted. No erythema.  Psychiatric: She has a normal mood and affect. Her behavior is normal. Judgment and thought content normal.     Lab  Results  Component Value Date   WBC 4.4* 01/19/2012   HGB 12.8 01/19/2012   HCT 38.2 01/19/2012   PLT 242.0 01/19/2012   GLUCOSE 103* 01/19/2012   CHOL 253* 01/19/2012   TRIG 153.0* 01/19/2012   HDL 38.30* 01/19/2012   LDLDIRECT 185.7 01/19/2012   ALT 24 01/19/2012   AST 19  01/19/2012   NA 141 01/19/2012   K 3.6 01/19/2012   CL 104 01/19/2012   CREATININE 0.9 01/19/2012   BUN 17 01/19/2012   CO2 31 01/19/2012   TSH 0.55 01/19/2012   INR 0.9 01/10/2007   HGBA1C 6.0 08/26/2008        Assessment & Plan:

## 2013-01-31 NOTE — Assessment & Plan Note (Signed)
Start Klor-con

## 2013-02-01 ENCOUNTER — Ambulatory Visit: Payer: 59

## 2013-02-01 ENCOUNTER — Telehealth: Payer: Self-pay | Admitting: *Deleted

## 2013-02-01 DIAGNOSIS — R944 Abnormal results of kidney function studies: Secondary | ICD-10-CM

## 2013-02-01 DIAGNOSIS — D649 Anemia, unspecified: Secondary | ICD-10-CM

## 2013-02-01 LAB — IBC PANEL
Saturation Ratios: 36.7 % (ref 20.0–50.0)
Transferrin: 179.3 mg/dL — ABNORMAL LOW (ref 212.0–360.0)

## 2013-02-01 NOTE — Telephone Encounter (Signed)
Message copied by Merrilyn Puma on Thu Feb 01, 2013  8:24 AM ------      Message from: Tresa Garter      Created: Wed Jan 31, 2013  9:01 PM       Misty Stanley, please ask lab to run FT4, iron and TIBC      THx!       ------

## 2013-02-01 NOTE — Telephone Encounter (Signed)
Add on sheet faxed down to [facility].

## 2013-02-04 ENCOUNTER — Other Ambulatory Visit: Payer: Self-pay | Admitting: Internal Medicine

## 2013-02-04 DIAGNOSIS — E0590 Thyrotoxicosis, unspecified without thyrotoxic crisis or storm: Secondary | ICD-10-CM

## 2013-02-12 ENCOUNTER — Ambulatory Visit (INDEPENDENT_AMBULATORY_CARE_PROVIDER_SITE_OTHER): Payer: 59

## 2013-02-12 ENCOUNTER — Ambulatory Visit (INDEPENDENT_AMBULATORY_CARE_PROVIDER_SITE_OTHER): Payer: 59 | Admitting: Endocrinology

## 2013-02-12 ENCOUNTER — Encounter: Payer: Self-pay | Admitting: Endocrinology

## 2013-02-12 VITALS — BP 138/70 | HR 105 | Temp 98.3°F | Resp 12 | Ht 66.0 in | Wt 152.9 lb

## 2013-02-12 DIAGNOSIS — E0590 Thyrotoxicosis, unspecified without thyrotoxic crisis or storm: Secondary | ICD-10-CM

## 2013-02-12 DIAGNOSIS — J309 Allergic rhinitis, unspecified: Secondary | ICD-10-CM

## 2013-02-12 MED ORDER — METOPROLOL SUCCINATE ER 50 MG PO TB24: 50.0000 mg | ORAL_TABLET | Freq: Every day | ORAL | Status: DC

## 2013-02-12 MED ORDER — METHIMAZOLE 10 MG PO TABS: 10.0000 mg | ORAL_TABLET | Freq: Two times a day (BID) | ORAL | Status: DC

## 2013-02-12 NOTE — Progress Notes (Signed)
Reather Littler M.D.             [facility]   Patient ID: Ashley Larsen, female   DOB: February 03, 1951, 62 y.o.   MRN: [id number]   Reason for Appointment:  Hyperthyroidism, new consultation   History of Present Illness:   The Hyperthyroidism was first diagnosed in 01/2013 She had a routine physical exam and her TSH was abnormal, previously had been consistently normal    The symptoms consistent with hyperthyroidism are:  nervousness, shakiness especially in the morning, occasionally feeling hot and sweaty and significant fatigue and evenings. Also has some shortness of breath on exertion Her symptoms started in October 2014 She does not complain of any palpitations. Recently also noticed that she has difficulty going up steps with weakness in her legs  The patient has lost 42 lbs since ? 9/13, has upper abdominal discomfort when she tries to eat although her appetite is fairly good Since she had evidence of hyperthyroidism on her labs she is referred here for further management   Labs:   Lab Results  Component Value Date   TSH 0.01* 01/31/2013   TSH 0.55 01/19/2012   TSH 1.13 07/01/2010   FREET4 4.75* 02/01/2013         No visits with results within 1 Week(s) from this visit. Latest known visit with results is:  Clinical Support on 02/01/2013  Component Date Value Range Status  . Free T4 02/01/2013 4.75* 0.60 - 1.60 ng/dL Final  . Iron 62/13/0865 92  42 - 145 ug/dL Final  . Transferrin 78/46/9629 179.3* 212.0 - 360.0 mg/dL Final  . Saturation Ratios 02/01/2013 36.7  20.0 - 50.0 % Final      Medication List       This list is accurate as of: 02/12/13  3:41 PM.  Always use your most recent med list.               albuterol 108 (90 BASE) MCG/ACT inhaler  Commonly known as:  PROVENTIL HFA;VENTOLIN HFA  Inhale 2 puffs into the lungs every 6 (six) hours as needed  for wheezing.     diclofenac sodium 1 % Gel  Commonly known as:  VOLTAREN  Apply 4 g topically 4 (four) times daily.     fexofenadine 180 MG tablet  Commonly known as:  ALLEGRA  Take 180 mg by mouth daily as needed.     hydrochlorothiazide 12.5 MG capsule  Commonly known as:  MICROZIDE  Take 1 capsule (12.5 mg total) by mouth every morning.     HYDROcodone-acetaminophen 5-325 MG per tablet  Commonly known as:  NORCO/VICODIN  Take 1 tablet by mouth every 8 (eight) hours as needed for severe pain. Please fill on or after 03/06/13     pantoprazole 40 MG tablet  Commonly known as:  PROTONIX  Take 1 tablet (40 mg total) by mouth daily.  potassium chloride 10 MEQ tablet  Commonly known as:  KLOR-CON 10  Take 1 tablet (10 mEq total) by mouth 2 (two) times daily.     promethazine-codeine 6.25-10 MG/5ML syrup  Commonly known as:  PHENERGAN with CODEINE            Past Medical History  Diagnosis Date  . Asthma   . Allergic rhinitis   . Insomnia   . Paresthesia   . GERD (gastroesophageal reflux disease)   . Diverticulosis   . Ovarian cyst   . Alopecia   . IBS (irritable bowel syndrome)   . Hypertension   . COPD (chronic obstructive pulmonary disease)     Past Surgical History  Procedure Laterality Date  . Tubal ligation    . Knee arthroscopy      Left  . Oophorectomy      Left  . Rotator cuff repair    . Colonoscopy      Family History  Problem Relation Age of Onset  . Hypertension    . Cancer Sister 85    colon  . Diabetes Sister   . Cancer Brother     brother  . Colon polyps Sister     and Brother  . Cancer Sister 64     colon  . Kidney disease Brother   . Diabetes Brother   . Diabetes Brother   . Heart disease Father   . Other Mother     sclerdermia  . Diabetes Paternal Grandfather   . Colon cancer Sister     Social History:  reports that she has been smoking Cigarettes.  She has a 6 pack-year smoking history. She has never used smokeless  tobacco. She reports that she does not drink alcohol or use illicit drugs.  Allergies:  Allergies  Allergen Reactions  . Metronidazole     REACTION: upset stomach  . Oxycodone-Acetaminophen     Review of Systems:  She has a long-standing history of thinning hair and has been told by dermatologist to have alopecia CARDIOLOGY: has history of high blood pressure treated with HCTZ.       RESPIRATORY:  has mild dyspnea on exertion. No history of asthma      GASTROENTEROLOGY:  no Change in bowel habits.      ENDOCRINOLOGY:  no history of Diabetes.       No history of hot flashes        Examination:   BP 138/70  Pulse 105  Temp(Src) 98.3 F (36.8 C)  Resp 12  Ht 5\' 6"  (1.676 m)  Wt 152 lb 14.4 oz (69.355 kg)  BMI 24.69 kg/m2  SpO2 97%   General Appearance:  averagely built and nourished, pleasant, not anxious or hyperkinetic.        Eyes: No excessive prominence, lid lag or stare. No swelling of the eyelids  Neck: The thyroid is enlarged 1.5 times normal, smooth, slightly firm, non-tender and diffuse. There is no lymphadenopathy in the neck .          Heart: normal S1 and S2, no murmurs .         Lungs: breath sounds are clear bilaterally  Abdomen: No hepatosplenomegaly Extremities: hands are normal to touch. No ankle edema. Neurological: REFLEXES: at biceps are slightly brisk but difficult to elicit .  TREMORS: no fine tremors are present..    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 Discussed pros and cons of both the treatments in detail Discussed how I-131 was given and also possible side effects of antithyroid drugs Given handout on hyperthyroidism and I-131 treatment Patient is currently undecided on the treatment modality she would prefer Will start her on antithyroid drugs and beta blockers and have her come back in 4 weeks with her final decision Sent prescription for methimazole 10 mg twice a day and metoprolol ER 50 mg daily  Carlie Corpus 02/12/2013, 3:41 PM

## 2013-02-12 NOTE — Patient Instructions (Signed)
Metoprolol Er 50mg  daily  Methoimazole 10 mg twice daily

## 2013-02-16 ENCOUNTER — Telehealth: Payer: Self-pay | Admitting: *Deleted

## 2013-02-16 NOTE — Telephone Encounter (Signed)
Pt's ins does not cover Pantoprazole 40 mg. There are no preferred meds. Left detailed mess informing pt.

## 2013-02-21 ENCOUNTER — Telehealth: Payer: Self-pay | Admitting: *Deleted

## 2013-02-21 MED ORDER — OMEPRAZOLE 40 MG PO CPDR: 40.0000 mg | DELAYED_RELEASE_CAPSULE | Freq: Every day | ORAL | Status: DC

## 2013-02-21 NOTE — Telephone Encounter (Signed)
Ok Prilosec 40 mg/d #30 5 ref Thx

## 2013-02-21 NOTE — Telephone Encounter (Signed)
Per ins Pantoprazole is a non preferred. No PA can be done. The rep I spoke to could not give me any preferred alternatives. Please advise.

## 2013-02-21 NOTE — Telephone Encounter (Signed)
New Rx sent. Left detailed mess informing pt of med changes.

## 2013-02-25 ENCOUNTER — Other Ambulatory Visit: Payer: Self-pay | Admitting: Internal Medicine

## 2013-02-26 NOTE — Telephone Encounter (Signed)
Refill done.  

## 2013-03-06 ENCOUNTER — Ambulatory Visit (INDEPENDENT_AMBULATORY_CARE_PROVIDER_SITE_OTHER): Payer: 59

## 2013-03-06 ENCOUNTER — Other Ambulatory Visit: Payer: Self-pay | Admitting: Endocrinology

## 2013-03-06 ENCOUNTER — Encounter: Payer: Self-pay | Admitting: Endocrinology

## 2013-03-06 ENCOUNTER — Ambulatory Visit (INDEPENDENT_AMBULATORY_CARE_PROVIDER_SITE_OTHER): Payer: 59 | Admitting: Endocrinology

## 2013-03-06 ENCOUNTER — Other Ambulatory Visit (INDEPENDENT_AMBULATORY_CARE_PROVIDER_SITE_OTHER): Payer: 59

## 2013-03-06 VITALS — BP 126/78 | HR 110 | Temp 98.3°F | Resp 14 | Ht 65.0 in | Wt 150.6 lb

## 2013-03-06 DIAGNOSIS — E0590 Thyrotoxicosis, unspecified without thyrotoxic crisis or storm: Secondary | ICD-10-CM

## 2013-03-06 DIAGNOSIS — J309 Allergic rhinitis, unspecified: Secondary | ICD-10-CM

## 2013-03-06 LAB — T3, FREE: T3, Free: 6.9 pg/mL — ABNORMAL HIGH (ref 2.3–4.2)

## 2013-03-06 LAB — T4, FREE: Free T4: 2.24 ng/dL — ABNORMAL HIGH (ref 0.60–1.60)

## 2013-03-06 NOTE — Patient Instructions (Signed)
Start Metoprolol

## 2013-03-06 NOTE — Progress Notes (Signed)
Quick Note:  Please let patient know thyroid level is still high, increase methimazole to 3 tablets daily and continue for 3 weeks then stop. Will schedule her I-131 uptake test and then treatment in 4 weeks ______

## 2013-03-06 NOTE — Progress Notes (Signed)
Patient ID: Ashley Larsen, female   DOB: November 04, 1950, 63 y.o.   MRN: [id number]   Reason for Appointment:  Hyperthyroidism, followup   History of Present Illness:   Her Hyperthyroidism was first diagnosed in 01/2013  The symptoms consistent with hyperthyroidism  at the time of diagnosis were:  nervousness, shakiness especially in the morning, occasionally feeling hot and sweaty and significant fatigue and evenings. Also has some shortness of breath on exertion and weakness in her legs. She had lost overall 42 pounds since 2013 Her symptoms started in October 2014 She had presented for her routine physical exam and her TSH was abnormal, previously had been consistently normal   Since starting methimazole on her initial visit in 12/14 she has had less shakiness, nervousness and shortness of breath. She has somewhat more energy and does not feel as hot and sweaty. However has lost some more weight She did not start metoprolol as directed because of fear of side effects She was asked to decide on treatment options with antithyroid drugs or I-131 and she has been still undecided on this    Wt Readings from Last 3 Encounters:  03/06/13 150 lb 9.6 oz (68.312 kg)  02/12/13 152 lb 14.4 oz (69.355 kg)  01/31/13 159 lb (72.122 kg)   Labs:   Lab Results  Component Value Date   TSH 0.01* 01/31/2013   TSH 0.55 01/19/2012   TSH 1.13 07/01/2010   FREET4 4.75* 02/01/2013           Medication List       This list is accurate as of: 03/06/13  9:00 AM.  Always use your most recent med list.               albuterol 108 (90 BASE) MCG/ACT inhaler  Commonly known as:  PROVENTIL HFA;VENTOLIN HFA  Inhale 2 puffs into the lungs every 6 (six) hours as needed for wheezing.     diclofenac sodium 1 % Gel  Commonly known as:  VOLTAREN  Apply 4 g topically 4 (four) times daily.     fexofenadine 180 MG tablet   Commonly known as:  ALLEGRA  Take 180 mg by mouth daily as needed.     hydrochlorothiazide 12.5 MG capsule  Commonly known as:  MICROZIDE  take 1 capsule by mouth every morning     HYDROcodone-acetaminophen 5-325 MG per tablet  Commonly known as:  NORCO/VICODIN  Take 1 tablet by mouth every 8 (eight) hours as needed for severe pain. Please fill on or after 03/06/13     methimazole 10 MG tablet  Commonly known as:  TAPAZOLE  Take 1 tablet (10 mg total) by mouth 2 (two) times daily.     metoprolol succinate 50 MG 24 hr tablet  Commonly known as:  TOPROL-XL  Take 1 tablet (50 mg total) by mouth daily. Take with or immediately following a meal.     potassium chloride 10 MEQ tablet  Commonly known as:  KLOR-CON 10  Take 1 tablet (10 mEq total) by mouth 2 (two) times daily.     promethazine-codeine 6.25-10 MG/5ML syrup  Commonly known as:  PHENERGAN with CODEINE            Past Medical History  Diagnosis Date  . Asthma   . Allergic rhinitis   . Insomnia   . Paresthesia   . GERD (gastroesophageal reflux disease)   . Diverticulosis   . Ovarian cyst   . Alopecia   . IBS (irritable bowel syndrome)   . Hypertension   . COPD (chronic obstructive pulmonary disease)     Past Surgical History  Procedure Laterality Date  . Tubal ligation    . Knee arthroscopy      Left  . Oophorectomy      Left  . Rotator cuff repair    . Colonoscopy      Family History  Problem Relation Age of Onset  . Hypertension    . Cancer Sister 43    colon  . Diabetes Sister   . Cancer Brother     brother  . Colon polyps Sister     and Brother  . Cancer Sister 37     colon  . Kidney disease Brother   . Diabetes Brother   . Diabetes Brother   . Heart disease Father   . Other Mother     sclerdermia  . Diabetes Paternal Grandfather   . Colon cancer Sister     Social History:  reports that she has been smoking Cigarettes.  She has a 6 pack-year smoking history. She has never used  smokeless tobacco. She reports that she does not drink alcohol or use illicit drugs.  Allergies:  Allergies  Allergen Reactions  . Metronidazole     REACTION: upset stomach  . Oxycodone-Acetaminophen     Review of Systems:  She has a long-standing history of thinning hair and has been told by dermatologist to have alopecia CARDIOLOGY: has history of high blood pressure treated with HCTZ.             Examination:   BP 126/78  Pulse 110  Temp(Src) 98.3 F (36.8 C)  Resp 14  Ht 5\' 5"  (1.651 m)  Wt 150 lb 9.6 oz (68.312 kg)  BMI 25.06 kg/m2  SpO2 98%   General Appearance: pleasant, not anxious      Eyes: No excessive prominence or stare Neck: The thyroid is enlarged about twice  normal, mostly on the right side and low lying; it is smooth, slightly firm. Neurological: REFLEXES: at biceps are slightly brisk but difficult to elicit .  TREMORS: no fine tremors are present..    Assessment/Plan:   Hyperthyroidism  from Graves' disease  She is subjectively somewhat better with antithyroid treatment but clinically still appears to be somewhat hyperthyroid with tachycardia and brisk reflexes Again discussed that I-131 is a better option for her since she has had fairly significant hyperthyroidism clinically and with her high free T4 level She agrees to I-131 treatment; will try to get her more euthyroid before doing this. To have labs today   Ashley Larsen 03/06/2013, 9:00 AM

## 2013-03-13 ENCOUNTER — Other Ambulatory Visit: Payer: Self-pay | Admitting: Internal Medicine

## 2013-03-22 ENCOUNTER — Ambulatory Visit (INDEPENDENT_AMBULATORY_CARE_PROVIDER_SITE_OTHER): Payer: 59

## 2013-03-22 DIAGNOSIS — J309 Allergic rhinitis, unspecified: Secondary | ICD-10-CM

## 2013-04-03 ENCOUNTER — Encounter (HOSPITAL_COMMUNITY): Admission: RE | Admit: 2013-04-03 | Payer: 59 | Source: Ambulatory Visit

## 2013-04-04 ENCOUNTER — Other Ambulatory Visit: Payer: Self-pay | Admitting: *Deleted

## 2013-04-04 ENCOUNTER — Encounter (HOSPITAL_COMMUNITY): Payer: 59

## 2013-04-04 ENCOUNTER — Telehealth: Payer: Self-pay | Admitting: *Deleted

## 2013-04-04 MED ORDER — METOPROLOL SUCCINATE ER 50 MG PO TB24
50.0000 mg | ORAL_TABLET | Freq: Every day | ORAL | Status: DC
Start: 2013-04-04 — End: 2013-07-19

## 2013-04-04 NOTE — Telephone Encounter (Signed)
Restart methimazole and stop one week before the radioactive iodine treatment

## 2013-04-04 NOTE — Telephone Encounter (Signed)
Patient called saying her procedure has been rescheduled until March 4th, she wants to know if she should start taking her thyroid meds again?

## 2013-04-05 NOTE — Telephone Encounter (Signed)
Patient is aware 

## 2013-04-18 ENCOUNTER — Encounter (HOSPITAL_COMMUNITY)
Admission: RE | Admit: 2013-04-18 | Discharge: 2013-04-18 | Disposition: A | Discharge status: Home / Self Care | Payer: 59 | Source: Ambulatory Visit | Attending: Endocrinology | Admitting: Endocrinology

## 2013-04-18 DIAGNOSIS — E0590 Thyrotoxicosis, unspecified without thyrotoxic crisis or storm: Secondary | ICD-10-CM | POA: Insufficient documentation

## 2013-04-19 ENCOUNTER — Encounter (HOSPITAL_COMMUNITY)
Admission: RE | Admit: 2013-04-19 | Discharge: 2013-04-19 | Disposition: A | Discharge status: Home / Self Care | Payer: 59 | Source: Ambulatory Visit | Attending: Endocrinology | Admitting: Endocrinology

## 2013-04-19 ENCOUNTER — Encounter (HOSPITAL_COMMUNITY): Payer: Self-pay

## 2013-04-19 HISTORY — DX: Thyrotoxicosis, unspecified without thyrotoxic crisis or storm: E05.90

## 2013-04-19 MED ORDER — SODIUM IODIDE I 131 CAPSULE
11.0000 | Freq: Once | INTRAVENOUS | Status: AC | PRN
  Administered 2013-04-19: 11 via ORAL

## 2013-04-20 ENCOUNTER — Ambulatory Visit (INDEPENDENT_AMBULATORY_CARE_PROVIDER_SITE_OTHER): Payer: 59

## 2013-04-20 DIAGNOSIS — J309 Allergic rhinitis, unspecified: Secondary | ICD-10-CM

## 2013-04-24 ENCOUNTER — Telehealth: Payer: Self-pay | Admitting: *Deleted

## 2013-04-24 ENCOUNTER — Telehealth: Payer: Self-pay | Admitting: Endocrinology

## 2013-04-24 NOTE — Telephone Encounter (Signed)
Patient wants to know if she should start taking her methimazole again?

## 2013-04-24 NOTE — Telephone Encounter (Signed)
Pt  Would like to know if she still needs to taker her thyroid medication   Please advise   Call back: [phone]  Thank You :)

## 2013-04-25 ENCOUNTER — Other Ambulatory Visit: Payer: Self-pay | Admitting: Endocrinology

## 2013-04-25 DIAGNOSIS — E0590 Thyrotoxicosis, unspecified without thyrotoxic crisis or storm: Secondary | ICD-10-CM

## 2013-04-25 NOTE — Telephone Encounter (Signed)
Left message on patient's voicemail.

## 2013-04-25 NOTE — Telephone Encounter (Signed)
Her I-131 treatment is going to be scheduled and she can start methimazole 3 days after that. Her next appointment needs to be in about 3 weeks with labs

## 2013-04-26 ENCOUNTER — Ambulatory Visit (INDEPENDENT_AMBULATORY_CARE_PROVIDER_SITE_OTHER): Payer: 59

## 2013-04-26 DIAGNOSIS — J309 Allergic rhinitis, unspecified: Secondary | ICD-10-CM

## 2013-05-03 ENCOUNTER — Encounter (HOSPITAL_COMMUNITY)
Admission: RE | Admit: 2013-05-03 | Discharge: 2013-05-03 | Disposition: A | Discharge status: Home / Self Care | Payer: 59 | Source: Ambulatory Visit | Attending: Endocrinology | Admitting: Endocrinology

## 2013-05-03 DIAGNOSIS — E0590 Thyrotoxicosis, unspecified without thyrotoxic crisis or storm: Secondary | ICD-10-CM

## 2013-05-03 MED ORDER — SODIUM IODIDE I 131 CAPSULE
11.8600 | Freq: Once | INTRAVENOUS | Status: AC | PRN
  Administered 2013-05-03: 11.86 via ORAL

## 2013-05-15 ENCOUNTER — Other Ambulatory Visit: Payer: Self-pay | Admitting: Internal Medicine

## 2013-05-17 ENCOUNTER — Ambulatory Visit (INDEPENDENT_AMBULATORY_CARE_PROVIDER_SITE_OTHER): Payer: 59

## 2013-05-17 DIAGNOSIS — J309 Allergic rhinitis, unspecified: Secondary | ICD-10-CM

## 2013-05-30 ENCOUNTER — Other Ambulatory Visit: Payer: Self-pay | Admitting: *Deleted

## 2013-05-30 ENCOUNTER — Other Ambulatory Visit: Payer: 59

## 2013-05-30 ENCOUNTER — Encounter: Payer: Self-pay | Admitting: Endocrinology

## 2013-05-30 ENCOUNTER — Ambulatory Visit (INDEPENDENT_AMBULATORY_CARE_PROVIDER_SITE_OTHER): Payer: 59 | Admitting: Endocrinology

## 2013-05-30 ENCOUNTER — Ambulatory Visit (INDEPENDENT_AMBULATORY_CARE_PROVIDER_SITE_OTHER): Payer: 59

## 2013-05-30 ENCOUNTER — Other Ambulatory Visit (INDEPENDENT_AMBULATORY_CARE_PROVIDER_SITE_OTHER): Payer: 59

## 2013-05-30 VITALS — BP 124/72 | HR 72 | Temp 98.1°F | Resp 16 | Ht 65.0 in | Wt 152.6 lb

## 2013-05-30 DIAGNOSIS — E876 Hypokalemia: Secondary | ICD-10-CM

## 2013-05-30 DIAGNOSIS — E0590 Thyrotoxicosis, unspecified without thyrotoxic crisis or storm: Secondary | ICD-10-CM

## 2013-05-30 DIAGNOSIS — J309 Allergic rhinitis, unspecified: Secondary | ICD-10-CM

## 2013-05-30 LAB — COMPREHENSIVE METABOLIC PANEL
ALK PHOS: 112 U/L (ref 39–117)
ALT: 23 U/L (ref 0–35)
AST: 20 U/L (ref 0–37)
Albumin: 3.8 g/dL (ref 3.5–5.2)
BILIRUBIN TOTAL: 0.9 mg/dL (ref 0.3–1.2)
BUN: 11 mg/dL (ref 6–23)
CO2: 30 mEq/L (ref 19–32)
CREATININE: 0.6 mg/dL (ref 0.4–1.2)
Calcium: 9.7 mg/dL (ref 8.4–10.5)
Chloride: 105 mEq/L (ref 96–112)
GFR: 125.92 mL/min (ref >60.00)
GLUCOSE: 106 mg/dL — AB (ref 70–99)
Potassium: 3 mEq/L — ABNORMAL LOW (ref 3.5–5.1)
Sodium: 140 mEq/L (ref 135–145)
Total Protein: 7.2 g/dL (ref 6.0–8.3)

## 2013-05-30 LAB — T4, FREE: Free T4: 1.17 ng/dL (ref 0.60–1.60)

## 2013-05-30 MED ORDER — POTASSIUM CHLORIDE CRYS ER 20 MEQ PO TBCR: 20.0000 meq | EXTENDED_RELEASE_TABLET | Freq: Every day | ORAL | Status: DC

## 2013-05-30 NOTE — Progress Notes (Signed)
Patient ID: Ashley Larsen, female   DOB: Feb 05, 1951, 63 y.o.   MRN: [id number]   Reason for Appointment:  Hyperthyroidism, followup   History of Present Illness:   Her Hyperthyroidism was first diagnosed in 01/2013  The symptoms consistent with hyperthyroidism  at the time of diagnosis were:  nervousness, shakiness especially in the morning, occasionally feeling hot and sweaty and significant fatigue and evenings. Also has some shortness of breath on exertion and weakness in her legs. She had lost overall 42 pounds since 2013 Her symptoms started in October 2014 She had presented for her routine physical exam and her TSH was abnormal, previously had been consistently normal   She was started on  methimazole on her initial visit in 12/14   However although she is subjectively better her thyroid levels were still high and she agreed to have I-131 treatment on However her I-131 treatment was not done until 05/03/13 Since then she has resumed her methimazole and is taking 10 mg daily  Currently she is complaining about feeling tired for the last 10 days as well as cold No further symptoms of the hyperthyroidism at this time and her weight has come up slightly    Wt Readings from Last 3 Encounters:  05/30/13 152 lb 9.6 oz (69.219 kg)  03/06/13 150 lb 9.6 oz (68.312 kg)  02/12/13 152 lb 14.4 oz (69.355 kg)   Labs:   Lab Results  Component Value Date   TSH 0.01* 01/31/2013   TSH 0.55 01/19/2012   TSH 1.13 07/01/2010   FREET4 2.24* 03/06/2013   FREET4 4.75* 02/01/2013           Medication List       This list is accurate as of: 05/30/13 11:21 AM.  Always use your most recent med list.               albuterol 108 (90 BASE) MCG/ACT inhaler  Commonly known as:  PROVENTIL HFA;VENTOLIN HFA  Inhale 2 puffs into the lungs every 6 (six) hours as needed for wheezing.     diclofenac sodium 1 % Gel   Commonly known as:  VOLTAREN  Apply 4 g topically 4 (four) times daily.     fexofenadine 180 MG tablet  Commonly known as:  ALLEGRA  Take 180 mg by mouth daily as needed.     hydrochlorothiazide 12.5 MG capsule  Commonly known as:  MICROZIDE  take 1 capsule by mouth every morning     HYDROcodone-acetaminophen 5-325 MG per tablet  Commonly known as:  NORCO/VICODIN  Take 1 tablet by mouth every 8 (eight) hours as needed for severe pain. Please fill on or after 03/06/13     methimazole 10 MG tablet  Commonly known as:  TAPAZOLE  Take 1 tablet (10 mg total) by mouth 2 (two) times daily.     metoprolol succinate 50 MG 24 hr tablet  Commonly known as:  TOPROL-XL  Take 1 tablet (50 mg total) by mouth daily. Take  with or immediately following a meal.     potassium chloride 10 MEQ tablet  Commonly known as:  KLOR-CON 10  Take 1 tablet (10 mEq total) by mouth 2 (two) times daily.     promethazine-codeine 6.25-10 MG/5ML syrup  Commonly known as:  PHENERGAN with CODEINE  take 1 teaspoonful by mouth every 6 hours if needed for cough            Past Medical History  Diagnosis Date  . Asthma   . Allergic rhinitis   . Insomnia   . Paresthesia   . GERD (gastroesophageal reflux disease)   . Diverticulosis   . Ovarian cyst   . Alopecia   . IBS (irritable bowel syndrome)   . Hypertension   . COPD (chronic obstructive pulmonary disease)   . Hyperthyroidism     Past Surgical History  Procedure Laterality Date  . Tubal ligation    . Knee arthroscopy      Left  . Oophorectomy      Left  . Rotator cuff repair    . Colonoscopy      Family History  Problem Relation Age of Onset  . Hypertension    . Cancer Sister 75    colon  . Diabetes Sister   . Cancer Brother     brother  . Colon polyps Sister     and Brother  . Cancer Sister 47     colon  . Kidney disease Brother   . Diabetes Brother   . Diabetes Brother   . Heart disease Father   . Other Mother     sclerdermia   . Diabetes Paternal Grandfather   . Colon cancer Sister     Social History:  reports that she has been smoking Cigarettes.  She has a 6 pack-year smoking history. She has never used smokeless tobacco. She reports that she does not drink alcohol or use illicit drugs.  Allergies:  Allergies  Allergen Reactions  . Metronidazole     REACTION: upset stomach  . Oxycodone-Acetaminophen     Review of Systems:  She has a long-standing history of thinning hair and has been told by dermatologist to have alopecia HYPERTENSION: Has history of high blood pressure treated with HCTZ and metoprolol.             Examination:   BP 124/72  Pulse 72  Temp(Src) 98.1 F (36.7 C)  Resp 16  Ht 5\' 5"  (1.651 m)  Wt 152 lb 9.6 oz (69.219 kg)  BMI 25.39 kg/m2  SpO2 98%   General Appearance:  averagely built and nourished Her face appears slightly puffy    Eyes: No excessive prominence, minimal swelling of the eyelids Neck: The thyroid is enlarged about 1.5 times normal, mostly on the right side Neurological: REFLEXES: at biceps are slightly slow but difficult to elicit No tremors   Assessment/Plan:   Hyperthyroidism  from Graves' disease  She is subjectively  better with radioactive iodine treatment done about 4 weeks ago She may actually be getting mildly hypothyroid since she has some symptoms and signs: Also still is on methimazole 10 mg   To have labs today to decide on further management She will also be seen in followup in 4 weeks, discussed long-term hypothyroidism that will need to be treated once the ablation is complete  Ashley Larsen 05/30/2013, 11:21 AM   Addendum: Free T4 is normal, potassium low, needs to increase her potassium  Appointment on 05/30/2013  Component Date Value  Ref Range Status  . Free T4 05/30/2013 1.17  0.60 - 1.60 ng/dL Final  . Sodium 05/30/2013 140  135 - 145 mEq/L Final  . Potassium 05/30/2013 3.0* 3.5 - 5.1 mEq/L Final  . Chloride 05/30/2013 105  96 - 112  mEq/L Final  . CO2 05/30/2013 30  19 - 32 mEq/L Final  . Glucose, Bld 05/30/2013 106* 70 - 99 mg/dL Final  . BUN 05/30/2013 11  6 - 23 mg/dL Final  . Creatinine, Ser 05/30/2013 0.6  0.4 - 1.2 mg/dL Final  . Total Bilirubin 05/30/2013 0.9  0.3 - 1.2 mg/dL Final  . Alkaline Phosphatase 05/30/2013 112  39 - 117 U/L Final  . AST 05/30/2013 20  0 - 37 U/L Final  . ALT 05/30/2013 23  0 - 35 U/L Final  . Total Protein 05/30/2013 7.2  6.0 - 8.3 g/dL Final  . Albumin 05/30/2013 3.8  3.5 - 5.2 g/dL Final  . Calcium 05/30/2013 9.7  8.4 - 10.5 mg/dL Final  . GFR 05/30/2013 125.92  >60.00 mL/min Final

## 2013-05-30 NOTE — Patient Instructions (Signed)
Stop Methimazole  Metoprolol 1/2 daily for 1 week then stop

## 2013-06-18 ENCOUNTER — Other Ambulatory Visit (INDEPENDENT_AMBULATORY_CARE_PROVIDER_SITE_OTHER): Payer: 59

## 2013-06-18 DIAGNOSIS — E876 Hypokalemia: Secondary | ICD-10-CM

## 2013-06-18 DIAGNOSIS — E0590 Thyrotoxicosis, unspecified without thyrotoxic crisis or storm: Secondary | ICD-10-CM

## 2013-06-18 LAB — BASIC METABOLIC PANEL
BUN: 11 mg/dL (ref 6–23)
CALCIUM: 9.9 mg/dL (ref 8.4–10.5)
CO2: 28 mEq/L (ref 19–32)
Chloride: 105 mEq/L (ref 96–112)
Creatinine, Ser: 0.7 mg/dL (ref 0.4–1.2)
GFR: 105.95 mL/min (ref >60.00)
Glucose, Bld: 85 mg/dL (ref 70–99)
POTASSIUM: 3.1 meq/L — AB (ref 3.5–5.1)
SODIUM: 141 meq/L (ref 135–145)

## 2013-06-18 LAB — TSH: TSH: 0.01 u[IU]/mL — ABNORMAL LOW (ref 0.35–5.50)

## 2013-06-18 LAB — T4, FREE: FREE T4: 0.93 ng/dL (ref 0.60–1.60)

## 2013-06-21 ENCOUNTER — Encounter: Payer: Self-pay | Admitting: Endocrinology

## 2013-06-21 ENCOUNTER — Ambulatory Visit (INDEPENDENT_AMBULATORY_CARE_PROVIDER_SITE_OTHER): Payer: 59 | Admitting: Endocrinology

## 2013-06-21 ENCOUNTER — Ambulatory Visit (INDEPENDENT_AMBULATORY_CARE_PROVIDER_SITE_OTHER): Payer: 59

## 2013-06-21 VITALS — BP 122/72 | HR 93 | Temp 98.2°F | Resp 16 | Ht 63.0 in | Wt 156.8 lb

## 2013-06-21 DIAGNOSIS — E876 Hypokalemia: Secondary | ICD-10-CM

## 2013-06-21 DIAGNOSIS — J309 Allergic rhinitis, unspecified: Secondary | ICD-10-CM

## 2013-06-21 DIAGNOSIS — E0590 Thyrotoxicosis, unspecified without thyrotoxic crisis or storm: Secondary | ICD-10-CM

## 2013-06-21 NOTE — Progress Notes (Signed)
Patient ID: Ashley Larsen, female   DOB: 1950-04-26, 63 y.o.   MRN: [id number]   Reason for Appointment:  Hyperthyroidism, followup   History of Present Illness:   Her Hyperthyroidism was first diagnosed in 01/2013  The symptoms consistent with hyperthyroidism  at the time of diagnosis were:  nervousness, shakiness especially in the morning, occasionally feeling hot and sweaty and significant fatigue and evenings. Also has some shortness of breath on exertion and weakness in her legs. She had lost overall 42 pounds since 2013 Her symptoms started in October 2014 She had presented for her routine physical exam and her TSH was abnormal, previously had been consistently normal   She was started on  methimazole on her initial visit in 12/14   However although she is subjectively better her thyroid levels were still high and she agreed to have I-131 treatment which was done on 05/03/13 with 12 mCi Since then she had taken methimazole until her last visit and has been off 3 weeks  Currently she is complaining only of her eye is having some tearing. No unusual fatigue compared to the last visit and no cord intolerance. No recurrence of symptoms of the hyperthyroidism at this time and her weight has come up slightly    Wt Readings from Last 3 Encounters:  06/21/13 156 lb 12.8 oz (71.124 kg)  05/30/13 152 lb 9.6 oz (69.219 kg)  03/06/13 150 lb 9.6 oz (68.312 kg)   Labs:   Lab Results  Component Value Date   TSH 0.01* 06/18/2013   TSH 0.01* 01/31/2013   TSH 0.55 01/19/2012   FREET4 0.93 06/18/2013   FREET4 1.17 05/30/2013   FREET4 2.24* 03/06/2013           Medication List       This list is accurate as of: 06/21/13 10:54 AM.  Always use your most recent med list.               albuterol 108 (90 BASE) MCG/ACT inhaler  Commonly known as:  PROVENTIL HFA;VENTOLIN HFA  Inhale 2 puffs into the lungs  every 6 (six) hours as needed for wheezing.     diclofenac sodium 1 % Gel  Commonly known as:  VOLTAREN  Apply 4 g topically 4 (four) times daily.     fexofenadine 180 MG tablet  Commonly known as:  ALLEGRA  Take 180 mg by mouth daily as needed.     hydrochlorothiazide 12.5 MG capsule  Commonly known as:  MICROZIDE  take 1 capsule by mouth every morning     HYDROcodone-acetaminophen 5-325 MG per tablet  Commonly known as:  NORCO/VICODIN  Take 1 tablet by mouth every 8 (eight) hours as needed for severe pain. Please fill on or after 03/06/13     methimazole 10 MG tablet  Commonly known as:  TAPAZOLE  Take 1 tablet (10 mg total) by mouth 2 (two) times daily.     metoprolol succinate 50 MG 24 hr tablet  Commonly known as:  TOPROL-XL  Take 1 tablet (50 mg total) by mouth daily. Take with or immediately following a meal.     potassium chloride SA 20 MEQ tablet  Commonly known as:  K-DUR,KLOR-CON  Take 1 tablet (20 mEq total) by mouth daily.     promethazine-codeine 6.25-10 MG/5ML syrup  Commonly known as:  PHENERGAN with CODEINE  take 1 teaspoonful by mouth every 6 hours if needed for cough            Past  Medical History  Diagnosis Date  . Asthma   . Allergic rhinitis   . Insomnia   . Paresthesia   . GERD (gastroesophageal reflux disease)   . Diverticulosis   . Ovarian cyst   . Alopecia   . IBS (irritable bowel syndrome)   . Hypertension   . COPD (chronic obstructive pulmonary disease)   . Hyperthyroidism     Past Surgical History  Procedure Laterality Date  . Tubal ligation    . Knee arthroscopy      Left  . Oophorectomy      Left  . Rotator cuff repair    . Colonoscopy      Family History  Problem Relation Age of Onset  . Hypertension    . Cancer Sister 56    colon  . Diabetes Sister   . Cancer Brother     brother  . Colon polyps Sister     and Brother  . Cancer Sister 21     colon  . Kidney disease Brother   . Diabetes Brother   . Diabetes  Brother   . Heart disease Father   . Other Mother     sclerdermia  . Diabetes Paternal Grandfather   . Colon cancer Sister     Social History:  reports that she has been smoking Cigarettes.  She has a 6 pack-year smoking history. She has never used smokeless tobacco. She reports that she does not drink alcohol or use illicit drugs.  Allergies:  Allergies  Allergen Reactions  . Metronidazole     REACTION: upset stomach  . Oxycodone-Acetaminophen     Review of Systems:  She has a long-standing history of thinning hair and has been told by dermatologist to have alopecia HYPERTENSION: Has history of high blood pressure treated with HCTZ only at this time            Examination:   BP 122/72  Pulse 93  Temp(Src) 98.2 F (36.8 C)  Resp 16  Ht 5\' 3"  (1.6 m)  Wt 156 lb 12.8 oz (71.124 kg)  BMI 27.78 kg/m2  SpO2 98%  Repeat pulse rate is 88  General Appearance:  averagely built and nourished  Eyes: No excessive prominence, minimal swelling of the eyelids Neck: The thyroid is just palpable   on the right side Neurological: REFLEXES: at biceps are  difficult to elicit No tremors   Assessment/Plan:   Hyperthyroidism  from Graves' disease  She is subjectively better with radioactive iodine treatment done about 7 weeks ago Currently she is euthyroid and discussed that her hypothyroidism may occur anytime in the next few weeks   She will also be seen in followup in 4 weeks. She will call if she has any unusual fatigue before that Also discussed long-term hypothyroidism that will need to be treated once the ablation is complete  Hypokalemia: This is persistent despite her taking potassium supplements. Since her blood pressure is relatively low we'll have her stop the HCTZ, continue potassium for 2 more weeks For hypertension she can restart taking metoprolol  [name] 06/21/2013, 10:54 AM   No visits with results within 1 Day(s) from this visit. Latest known visit with  results is:  Appointment on 06/18/2013  Component Date Value Ref Range Status  . TSH 06/18/2013 0.01* 0.35 - 5.50 uIU/mL Final  . Free T4 06/18/2013 0.93  0.60 - 1.60 ng/dL Final  . Sodium 06/18/2013 141  135 - 145 mEq/L Final  . Potassium 06/18/2013 3.1* 3.5 -  5.1 mEq/L Final  . Chloride 06/18/2013 105  96 - 112 mEq/L Final  . CO2 06/18/2013 28  19 - 32 mEq/L Final  . Glucose, Bld 06/18/2013 85  70 - 99 mg/dL Final  . BUN 06/18/2013 11  6 - 23 mg/dL Final  . Creatinine, Ser 06/18/2013 0.7  0.4 - 1.2 mg/dL Final  . Calcium 06/18/2013 9.9  8.4 - 10.5 mg/dL Final  . GFR 06/18/2013 105.95  >60.00 mL/min Final

## 2013-06-21 NOTE — Patient Instructions (Addendum)
Stop HCTZ, stay on potassium for 2 weeks  Call if fatigue returns  Metoprolol as before

## 2013-06-26 ENCOUNTER — Ambulatory Visit: Payer: 59 | Admitting: Internal Medicine

## 2013-07-03 ENCOUNTER — Ambulatory Visit (INDEPENDENT_AMBULATORY_CARE_PROVIDER_SITE_OTHER): Payer: 59

## 2013-07-03 DIAGNOSIS — J309 Allergic rhinitis, unspecified: Secondary | ICD-10-CM

## 2013-07-10 ENCOUNTER — Telehealth: Payer: Self-pay | Admitting: Endocrinology

## 2013-07-10 ENCOUNTER — Ambulatory Visit: Payer: 59

## 2013-07-10 ENCOUNTER — Other Ambulatory Visit: Payer: Self-pay | Admitting: *Deleted

## 2013-07-10 NOTE — Telephone Encounter (Signed)
Rx requested for Methimazole: This has been stopped, Rx to be denied

## 2013-07-10 NOTE — Telephone Encounter (Signed)
Noted,

## 2013-07-12 ENCOUNTER — Ambulatory Visit (INDEPENDENT_AMBULATORY_CARE_PROVIDER_SITE_OTHER): Payer: 59

## 2013-07-12 ENCOUNTER — Telehealth: Payer: Self-pay | Admitting: Endocrinology

## 2013-07-12 DIAGNOSIS — J309 Allergic rhinitis, unspecified: Secondary | ICD-10-CM

## 2013-07-12 NOTE — Telephone Encounter (Signed)
Patient states her rx for methomazol was not called into her pharmacy    Thank you :o)

## 2013-07-12 NOTE — Telephone Encounter (Signed)
Per Dr. Dwyane Dee methimazole has been stopped, rx was denied.

## 2013-07-16 ENCOUNTER — Other Ambulatory Visit (INDEPENDENT_AMBULATORY_CARE_PROVIDER_SITE_OTHER): Payer: BC Managed Care – PPO

## 2013-07-16 DIAGNOSIS — E0590 Thyrotoxicosis, unspecified without thyrotoxic crisis or storm: Secondary | ICD-10-CM

## 2013-07-16 LAB — T4, FREE: Free T4: 0.57 ng/dL — ABNORMAL LOW (ref 0.60–1.60)

## 2013-07-16 LAB — TSH: TSH: 0.06 u[IU]/mL — ABNORMAL LOW (ref 0.35–4.50)

## 2013-07-19 ENCOUNTER — Ambulatory Visit (INDEPENDENT_AMBULATORY_CARE_PROVIDER_SITE_OTHER): Payer: 59

## 2013-07-19 ENCOUNTER — Ambulatory Visit (INDEPENDENT_AMBULATORY_CARE_PROVIDER_SITE_OTHER): Payer: 59 | Admitting: Endocrinology

## 2013-07-19 ENCOUNTER — Encounter: Payer: Self-pay | Admitting: Endocrinology

## 2013-07-19 VITALS — BP 114/68 | HR 74 | Temp 97.7°F | Resp 14 | Ht 63.0 in | Wt 164.2 lb

## 2013-07-19 DIAGNOSIS — E890 Postprocedural hypothyroidism: Secondary | ICD-10-CM

## 2013-07-19 DIAGNOSIS — J309 Allergic rhinitis, unspecified: Secondary | ICD-10-CM

## 2013-07-19 MED ORDER — LEVOTHYROXINE SODIUM 112 MCG PO TABS: 112.0000 ug | ORAL_TABLET | Freq: Every day | ORAL | Status: DC

## 2013-07-19 NOTE — Patient Instructions (Addendum)
Stop HCTZ and potassium  Take thyroid pill in am, no iron/calcium with it

## 2013-07-19 NOTE — Progress Notes (Signed)
Patient ID: Ashley Larsen, female   DOB: 1950-08-20, 63 y.o.   MRN: [id number]   Reason for Appointment:  Hyperthyroidism, followup   History of Present Illness:   Her Hyperthyroidism was first diagnosed in 01/2013  The symptoms consistent with hyperthyroidism  at the time of diagnosis were:  nervousness, shakiness especially in the morning, occasionally feeling hot and sweaty and significant fatigue and evenings. Also has some shortness of breath on exertion and weakness in her legs. She had lost overall 42 pounds since 2013 Although she was subjectively better with methimazole her thyroid levels were still high and she agreed to have I-131 treatment which was done on 05/03/13 with 12 mCi Since then she has been off methimazole  Currently she is complaining of marked fatigue for the last [redacted] weeks along with cold intolerance and weight gain On her last visit her right thyroid lobe was still palpable and her free T4 was normal   Wt Readings from Last 3 Encounters:  07/19/13 164 lb 3.2 oz (74.481 kg)  06/21/13 156 lb 12.8 oz (71.124 kg)  05/30/13 152 lb 9.6 oz (69.219 kg)   Labs:   Lab Results  Component Value Date   TSH 0.06* 07/16/2013   TSH 0.01* 06/18/2013   TSH 0.01* 01/31/2013   FREET4 0.57* 07/16/2013   FREET4 0.93 06/18/2013   FREET4 1.17 05/30/2013           Medication List       This list is accurate as of: 07/19/13 10:11 AM.  Always use your most recent med list.               albuterol 108 (90 BASE) MCG/ACT inhaler  Commonly known as:  PROVENTIL HFA;VENTOLIN HFA  Inhale 2 puffs into the lungs every 6 (six) hours as needed for wheezing.     diclofenac sodium 1 % Gel  Commonly known as:  VOLTAREN  Apply 4 g topically 4 (four) times daily.     fexofenadine 180 MG tablet  Commonly known as:  ALLEGRA  Take 180 mg by mouth daily as needed.     hydrochlorothiazide 12.5 MG capsule   Commonly known as:  MICROZIDE  take 1 capsule by mouth every morning     HYDROcodone-acetaminophen 5-325 MG per tablet  Commonly known as:  NORCO/VICODIN  Take 1 tablet by mouth every 8 (eight) hours as needed for severe pain. Please fill on or after 03/06/13     metoprolol succinate 50 MG 24 hr tablet  Commonly known as:  TOPROL-XL  Take 1 tablet (50 mg total) by mouth daily. Take with or immediately following a meal.     potassium chloride SA 20 MEQ tablet  Commonly known as:  K-DUR,KLOR-CON  Take 1 tablet (20 mEq total) by mouth daily.     promethazine-codeine 6.25-10 MG/5ML syrup  Commonly known as:  PHENERGAN with CODEINE  take 1 teaspoonful by mouth every 6 hours if needed for cough            Past Medical History  Diagnosis Date  . Asthma   . Allergic rhinitis   . Insomnia   . Paresthesia   . GERD (gastroesophageal reflux disease)   . Diverticulosis   . Ovarian cyst   . Alopecia   . IBS (irritable bowel syndrome)   . Hypertension   . COPD (chronic obstructive pulmonary disease)   . Hyperthyroidism     Past Surgical History  Procedure Laterality Date  . Tubal ligation    .  Knee arthroscopy      Left  . Oophorectomy      Left  . Rotator cuff repair    . Colonoscopy      Family History  Problem Relation Age of Onset  . Hypertension    . Cancer Sister 21    colon  . Diabetes Sister   . Cancer Brother     brother  . Colon polyps Sister     and Brother  . Cancer Sister 69     colon  . Kidney disease Brother   . Diabetes Brother   . Diabetes Brother   . Heart disease Father   . Other Mother     sclerdermia  . Diabetes Paternal Grandfather   . Colon cancer Sister     Social History:  reports that she has been smoking Cigarettes.  She has a 6 pack-year smoking history. She has never used smokeless tobacco. She reports that she does not drink alcohol or use illicit drugs.  Allergies:  Allergies  Allergen Reactions  . Metronidazole      REACTION: upset stomach  . Oxycodone-Acetaminophen     Review of Systems:  She has a long-standing history of thinning hair and has been told by dermatologist to have alopecia HYPERTENSION: Has history of high blood pressure treated with HCTZ only  Has been off metoprolol for control of her Hyperthyroidism However continues to have hypokalemia         Examination:   BP 114/68  Pulse 74  Temp(Src) 97.7 F (36.5 C)  Resp 14  Ht 5\' 3"  (1.6 m)  Wt 164 lb 3.2 oz (74.481 kg)  BMI 29.09 kg/m2  SpO2 97%   General Appearance:  looks well, minimal facial puffiness  Eyes: No excessive prominence, mild swelling of the eyelids Neck: The thyroid is nonpalpable Neurological: REFLEXES: at biceps are normal Skin is not unusually dry   Assessment/Plan:   Post-ablative hypothyroidism  She has developed post ablative hypothyroidism now and is symptomatic even though her free T4 is only slightly low Also did not have any thyroid enlargement now Discussed need for thyroid supplementation which would be lifelong Will empirically start her on 112 mcg daily based on her weight Advice her on taking this in the morning and discussed interacting supplements She will have a followup in 6 weeks to assess the dosage  Hypokalemia: This is has been persistent despite her taking potassium supplements. She has not stopped her HCTZ as discussed before and will do so now since her blood pressure is already low normal   [name] 07/19/2013, 10:11 AM   No visits with results within 1 Day(s) from this visit. Latest known visit with results is:  Appointment on 07/16/2013  Component Date Value Ref Range Status  . TSH 07/16/2013 0.06* 0.35 - 4.50 uIU/mL Final  . Free T4 07/16/2013 0.57* 0.60 - 1.60 ng/dL Final

## 2013-07-23 ENCOUNTER — Other Ambulatory Visit: Payer: Self-pay | Admitting: *Deleted

## 2013-07-26 ENCOUNTER — Ambulatory Visit: Payer: 59

## 2013-08-02 ENCOUNTER — Telehealth: Payer: Self-pay | Admitting: Internal Medicine

## 2013-08-02 NOTE — Telephone Encounter (Signed)
OK to fill this prescription with additional refills x0 OV q 3 mo Thank you!  

## 2013-08-02 NOTE — Telephone Encounter (Signed)
Patient left msg stating she needs to pick up prescription for Hydrocodone.

## 2013-08-03 ENCOUNTER — Ambulatory Visit (INDEPENDENT_AMBULATORY_CARE_PROVIDER_SITE_OTHER): Payer: 59

## 2013-08-03 DIAGNOSIS — J309 Allergic rhinitis, unspecified: Secondary | ICD-10-CM

## 2013-08-03 MED ORDER — HYDROCODONE-ACETAMINOPHEN 5-325 MG PO TABS: 1.0000 | ORAL_TABLET | Freq: Three times a day (TID) | ORAL | Status: DC | PRN

## 2013-08-03 NOTE — Telephone Encounter (Signed)
Pt informed rx printed/upfront for p/u.

## 2013-08-08 ENCOUNTER — Encounter: Payer: Self-pay | Admitting: Internal Medicine

## 2013-08-08 ENCOUNTER — Ambulatory Visit (INDEPENDENT_AMBULATORY_CARE_PROVIDER_SITE_OTHER): Payer: BC Managed Care – PPO | Admitting: Internal Medicine

## 2013-08-08 ENCOUNTER — Ambulatory Visit (INDEPENDENT_AMBULATORY_CARE_PROVIDER_SITE_OTHER): Payer: BC Managed Care – PPO

## 2013-08-08 VITALS — BP 130/70 | HR 80 | Temp 98.4°F | Resp 16 | Wt 166.0 lb

## 2013-08-08 DIAGNOSIS — R634 Abnormal weight loss: Secondary | ICD-10-CM

## 2013-08-08 DIAGNOSIS — H5710 Ocular pain, unspecified eye: Secondary | ICD-10-CM

## 2013-08-08 DIAGNOSIS — R21 Rash and other nonspecific skin eruption: Secondary | ICD-10-CM

## 2013-08-08 DIAGNOSIS — I10 Essential (primary) hypertension: Secondary | ICD-10-CM

## 2013-08-08 DIAGNOSIS — J449 Chronic obstructive pulmonary disease, unspecified: Secondary | ICD-10-CM

## 2013-08-08 DIAGNOSIS — E890 Postprocedural hypothyroidism: Secondary | ICD-10-CM

## 2013-08-08 DIAGNOSIS — H5713 Ocular pain, bilateral: Secondary | ICD-10-CM

## 2013-08-08 DIAGNOSIS — J309 Allergic rhinitis, unspecified: Secondary | ICD-10-CM

## 2013-08-08 DIAGNOSIS — J45909 Unspecified asthma, uncomplicated: Secondary | ICD-10-CM

## 2013-08-08 LAB — SEDIMENTATION RATE: SED RATE: 16 mm/h (ref 0–22)

## 2013-08-08 LAB — RHEUMATOID FACTOR

## 2013-08-08 LAB — TSH
TSH: 0.03 u[IU]/mL — ABNORMAL LOW (ref 0.35–4.50)
TSH: 0.03 u[IU]/mL — ABNORMAL LOW (ref 0.35–4.50)

## 2013-08-08 LAB — T4, FREE: FREE T4: 1.4 ng/dL (ref 0.60–1.60)

## 2013-08-08 MED ORDER — TRIAMCINOLONE ACETONIDE 0.1 % EX CREA: 1.0000 "application " | TOPICAL_CREAM | Freq: Three times a day (TID) | CUTANEOUS | Status: DC

## 2013-08-08 MED ORDER — METHYLPREDNISOLONE ACETATE 80 MG/ML IJ SUSP
80.0000 mg | Freq: Once | INTRAMUSCULAR | Status: AC
  Administered 2013-08-08: 80 mg via INTRAMUSCULAR

## 2013-08-08 MED ORDER — FEXOFENADINE HCL 180 MG PO TABS
180.0000 mg | ORAL_TABLET | Freq: Every day | ORAL | Status: DC | PRN
Start: 2013-08-08 — End: 2015-05-05

## 2013-08-08 MED ORDER — VITAMIN D 1000 UNITS PO TABS: 1000.0000 [IU] | ORAL_TABLET | Freq: Every day | ORAL | Status: AC

## 2013-08-08 MED ORDER — PROMETHAZINE-CODEINE 6.25-10 MG/5ML PO SYRP: ORAL_SOLUTION | ORAL | Status: DC

## 2013-08-08 NOTE — Progress Notes (Signed)
  Subjective:    HPI  C/o eye swelling w/blurred vision, rash x 2 mo. She was seen by an optometrist x 2 times and given abx F/u allergies - worse, LBP - better, knee pain, occ cough Nl BP at home  BP Readings from Last 3 Encounters:  08/08/13 130/70  07/19/13 114/68  06/21/13 122/72   Wt Readings from Last 3 Encounters:  08/08/13 166 lb (75.297 kg)  07/19/13 164 lb 3.2 oz (74.481 kg)  06/21/13 156 lb 12.8 oz (71.124 kg)      Review of Systems  Constitutional: Positive for fatigue. Negative for chills, activity change, appetite change and unexpected weight change.  HENT: Positive for congestion and rhinorrhea. Negative for ear discharge, mouth sores and sinus pressure.   Eyes: Positive for pain, discharge, redness and visual disturbance.  Respiratory: Positive for cough. Negative for chest tightness.   Gastrointestinal:       GERD  Genitourinary: Negative for difficulty urinating and vaginal pain.  Musculoskeletal: Negative for gait problem.  Skin: Negative for pallor and rash.  Neurological: Negative for dizziness and tremors.  Psychiatric/Behavioral: Positive for dysphoric mood. Negative for suicidal ideas, confusion and sleep disturbance. The patient is not nervous/anxious.        Objective:   Physical Exam  Constitutional: She appears well-developed. No distress.  Obese  HENT:  Head: Normocephalic.  Right Ear: External ear normal.  Left Ear: External ear normal.  Mouth/Throat: Oropharynx is clear and moist.  Swollen nasal mucosa  Eyes: Conjunctivae are normal. Pupils are equal, round, and reactive to light. Right eye exhibits no discharge. Left eye exhibits no discharge.  Neck: Normal range of motion. Neck supple. No JVD present. No tracheal deviation present. Thyromegaly (mild, NT) present.  Cardiovascular: Normal rate, regular rhythm and normal heart sounds.   Pulmonary/Chest: No stridor. No respiratory distress. She has no wheezes.  Abdominal: Soft. Bowel  sounds are normal. She exhibits no distension and no mass. There is no tenderness. There is no rebound and no guarding.  Musculoskeletal: She exhibits no edema and no tenderness.  Lymphadenopathy:    She has no cervical adenopathy.  Neurological: She displays normal reflexes. No cranial nerve deficit. She exhibits normal muscle tone. Coordination normal.  Skin: Rash noted. There is erythema.  Psychiatric: She has a normal mood and affect. Her behavior is normal. Judgment and thought content normal.     Lab Results  Component Value Date   WBC 3.6* 01/31/2013   HGB 10.9* 01/31/2013   HCT 32.3* 01/31/2013   PLT 185.0 01/31/2013   GLUCOSE 85 06/18/2013   CHOL 113 01/31/2013   TRIG 89.0 01/31/2013   HDL 34.80* 01/31/2013   LDLDIRECT 185.7 01/19/2012   LDLCALC 60 01/31/2013   ALT 23 05/30/2013   AST 20 05/30/2013   NA 141 06/18/2013   K 3.1* 06/18/2013   CL 105 06/18/2013   CREATININE 0.7 06/18/2013   BUN 11 06/18/2013   CO2 28 06/18/2013   TSH 0.06* 07/16/2013   INR 0.9 01/10/2007   HGBA1C 6.0 08/26/2008    A complex case     Assessment & Plan:

## 2013-08-08 NOTE — Assessment & Plan Note (Signed)
6/15 arms, legs -- started in May 2015 ?etiol r/o CTD Depomedrol im May need a skin bx Triamc cream

## 2013-08-08 NOTE — Assessment & Plan Note (Signed)
Continue with current prescription therapy as reflected on the Med list.  

## 2013-08-08 NOTE — Assessment & Plan Note (Addendum)
6/15 B - ?etiology R/o CTD Ophth ref Dr Alfonso Patten. Pt c/o eye swelling w/blurred vision, rash x 2 mo. She was seen by an optometrist x 2 times and given abx

## 2013-08-08 NOTE — Assessment & Plan Note (Signed)
Better - smoking less Prom cod prn cough

## 2013-08-08 NOTE — Progress Notes (Signed)
Pre visit review using our clinic review tool, if applicable. No additional management support is needed unless otherwise documented below in the visit note. 

## 2013-08-08 NOTE — Assessment & Plan Note (Signed)
Wt Readings from Last 3 Encounters:  08/08/13 166 lb (75.297 kg)  07/19/13 164 lb 3.2 oz (74.481 kg)  06/21/13 156 lb 12.8 oz (71.124 kg)

## 2013-08-08 NOTE — Assessment & Plan Note (Signed)
Off Rx 

## 2013-08-08 NOTE — Assessment & Plan Note (Signed)
2015 Dr Dwyane Dee Continue with current prescription therapy as reflected on the Med list.

## 2013-08-09 LAB — ANA: ANA: POSITIVE — AB

## 2013-08-09 LAB — ANTI-NUCLEAR AB-TITER (ANA TITER): ANA Titer 1: 1:40 {titer} — ABNORMAL HIGH

## 2013-08-13 NOTE — Progress Notes (Signed)
Quick Note:  Please reschedule patient for a sooner appointment than 7/16 since thyroid levels are abnormal ______

## 2013-08-15 ENCOUNTER — Ambulatory Visit: Payer: BC Managed Care – PPO

## 2013-08-16 ENCOUNTER — Ambulatory Visit (INDEPENDENT_AMBULATORY_CARE_PROVIDER_SITE_OTHER): Payer: BC Managed Care – PPO

## 2013-08-16 DIAGNOSIS — J309 Allergic rhinitis, unspecified: Secondary | ICD-10-CM

## 2013-08-20 ENCOUNTER — Ambulatory Visit (INDEPENDENT_AMBULATORY_CARE_PROVIDER_SITE_OTHER): Payer: BC Managed Care – PPO | Admitting: Endocrinology

## 2013-08-20 ENCOUNTER — Encounter: Payer: Self-pay | Admitting: Endocrinology

## 2013-08-20 VITALS — BP 134/86 | HR 84 | Temp 98.0°F | Resp 16 | Ht 63.0 in | Wt 167.8 lb

## 2013-08-20 DIAGNOSIS — E890 Postprocedural hypothyroidism: Secondary | ICD-10-CM

## 2013-08-20 MED ORDER — LEVOTHYROXINE SODIUM 88 MCG PO TABS: 88.0000 ug | ORAL_TABLET | Freq: Every day | ORAL | Status: DC

## 2013-08-20 NOTE — Progress Notes (Signed)
Patient ID: Ashley Larsen, female   DOB: [birth year], 63 y.o.   MRN: [id number]   Reason for Appointment:  Hyperthyroidism, followup   History of Present Illness:   Some tiredness, less, some cold  Her Hyperthyroidism was first diagnosed in 01/2013  The symptoms consistent with hyperthyroidism  at the time of diagnosis were:  nervousness, shakiness especially in the morning, occasionally feeling hot and sweaty and significant fatigue and evenings. Also has some shortness of breath on exertion and weakness in her legs. She had lost overall 42 pounds since 2013 Although she was subjectively better with methimazole her thyroid levels were still high and she was given I-131 treatment  on 05/03/13 with 12 mCi  Recent history: On 07/27/13 she had been complaining of marked fatigue along with weight gain and cold intolerance Because of her free T4 level of 0.57 she was started on 112 mcg Synthroid She thinks her fatigue is somewhat better but still is somewhat tired and cold Her weight appears to be leveling off recently She is taking her Synthroid regularly and not complaining about any shakiness or palpitations    Wt Readings from Last 3 Encounters:  08/20/13 167 lb 12.8 oz (76.114 kg)  08/08/13 166 lb (75.297 kg)  07/19/13 164 lb 3.2 oz (74.481 kg)   Labs:   Lab Results  Component Value Date   TSH 0.03* 08/08/2013   TSH 0.03* 08/08/2013   TSH 0.06* 07/16/2013   TSH 0.01* 06/18/2013   FREET4 1.40 08/08/2013   FREET4 0.57* 07/16/2013   FREET4 0.93 06/18/2013           Medication List       This list is accurate as of: 08/20/13  3:25 PM.  Always use your most recent med list.               albuterol 108 (90 BASE) MCG/ACT inhaler  Commonly known as:  PROVENTIL HFA;VENTOLIN HFA  Inhale 2 puffs into the lungs every 6 (six) hours as needed for wheezing.     cholecalciferol 1000 UNITS tablet  Commonly  known as:  VITAMIN D  Take 1 tablet (1,000 Units total) by mouth daily.     fexofenadine 180 MG tablet  Commonly known as:  ALLEGRA  Take 1 tablet (180 mg total) by mouth daily as needed for allergies.     HYDROcodone-acetaminophen 5-325 MG per tablet  Commonly known as:  NORCO/VICODIN  Take 1 tablet by mouth every 8 (eight) hours as needed for severe pain. Please fill on or after 08/03/13     levothyroxine 112 MCG tablet  Commonly known as:  SYNTHROID, LEVOTHROID  Take 1 tablet (112 mcg total) by mouth daily.     promethazine-codeine 6.25-10 MG/5ML syrup  Commonly known as:  PHENERGAN with CODEINE  take 1 teaspoonful by mouth every 6 hours if needed for cough     triamcinolone cream 0.1 %  Commonly known as:  KENALOG  Apply 1 application topically 3 (three) times daily.            Past Medical History  Diagnosis Date  . Asthma   . Allergic rhinitis   . Insomnia   . Paresthesia   . GERD (gastroesophageal reflux disease)   . Diverticulosis   . Ovarian cyst   . Alopecia   . IBS (irritable bowel syndrome)   . Hypertension   . COPD (chronic obstructive pulmonary disease)   . Hyperthyroidism     Past Surgical History  Procedure Laterality Date  .  Tubal ligation    . Knee arthroscopy      Left  . Oophorectomy      Left  . Rotator cuff repair    . Colonoscopy      Family History  Problem Relation Age of Onset  . Hypertension    . Cancer Sister 62    colon  . Diabetes Sister   . Cancer Brother     brother  . Colon polyps Sister     and Brother  . Cancer Sister 100     colon  . Kidney disease Brother   . Diabetes Brother   . Diabetes Brother   . Heart disease Father   . Other Mother     sclerdermia  . Diabetes Paternal Grandfather   . Colon cancer Sister     Social History:  reports that she has been smoking Cigarettes.  She has a 6 pack-year smoking history. She has never used smokeless tobacco. She reports that she does not drink alcohol or use  illicit drugs.  Allergies:  Allergies  Allergen Reactions  . Metronidazole     REACTION: upset stomach  . Oxycodone-Acetaminophen     Review of Systems:  She has a long-standing history of thinning hair and has been told by dermatologist to have alopecia HYPERTENSION: Has history of high blood pressure, currently appears not to be taking any medication Has been off metoprolol for control of her Hyperthyroidism  History of hypokalemia on HCTZ      Examination:   BP 134/86  Pulse 84  Temp(Src) 98 F (36.7 C)  Resp 16  Ht 5\' 3"  (1.6 m)  Wt 167 lb 12.8 oz (76.114 kg)  BMI 29.73 kg/m2  SpO2 98%   General Appearance:  looks well  Eyes: No excessive prominence, has some swelling of both eyelids Neck: The thyroid is nonpalpable Neurological: REFLEXES: at biceps are normal, no tremor     Assessment/Plan:   Post-ablative hypothyroidism  She has  post ablative hypothyroidism now and is less symptomatic with starting her levothyroxine supplements However her free T4 is significantly higher at 1.4 compared to her baseline of 0.57 and has increased within 3 weeks only TSH is still low but this may recover more slowly Most likely she will require a lower dose of levothyroxine and will have her switch to 88 mcg She can use of her current medication, to reduce her regimen by half tablet 3 times a week She will have a followup in 6 weeks to assess the dosage again   [facility] 08/20/2013, 3:25 PM

## 2013-08-20 NOTE — Patient Instructions (Signed)
Artificial tears to eyes 2x daily  Reduce dose to 88ug; with 1/2 tab on Tuesdays, Thursday  and Saturdays

## 2013-08-22 ENCOUNTER — Ambulatory Visit: Payer: BC Managed Care – PPO

## 2013-08-30 ENCOUNTER — Ambulatory Visit: Payer: 59 | Admitting: Endocrinology

## 2013-08-31 ENCOUNTER — Ambulatory Visit (INDEPENDENT_AMBULATORY_CARE_PROVIDER_SITE_OTHER): Payer: BC Managed Care – PPO

## 2013-08-31 DIAGNOSIS — J309 Allergic rhinitis, unspecified: Secondary | ICD-10-CM

## 2013-09-05 ENCOUNTER — Ambulatory Visit (INDEPENDENT_AMBULATORY_CARE_PROVIDER_SITE_OTHER): Payer: BC Managed Care – PPO

## 2013-09-05 DIAGNOSIS — J309 Allergic rhinitis, unspecified: Secondary | ICD-10-CM

## 2013-09-07 ENCOUNTER — Ambulatory Visit (INDEPENDENT_AMBULATORY_CARE_PROVIDER_SITE_OTHER): Payer: BC Managed Care – PPO

## 2013-09-07 DIAGNOSIS — J309 Allergic rhinitis, unspecified: Secondary | ICD-10-CM

## 2013-09-12 ENCOUNTER — Ambulatory Visit: Payer: BC Managed Care – PPO

## 2013-09-13 ENCOUNTER — Ambulatory Visit (INDEPENDENT_AMBULATORY_CARE_PROVIDER_SITE_OTHER): Payer: BC Managed Care – PPO

## 2013-09-13 DIAGNOSIS — J309 Allergic rhinitis, unspecified: Secondary | ICD-10-CM

## 2013-09-19 ENCOUNTER — Ambulatory Visit (INDEPENDENT_AMBULATORY_CARE_PROVIDER_SITE_OTHER): Payer: BC Managed Care – PPO

## 2013-09-19 DIAGNOSIS — J309 Allergic rhinitis, unspecified: Secondary | ICD-10-CM

## 2013-09-20 ENCOUNTER — Ambulatory Visit: Payer: BC Managed Care – PPO

## 2013-09-21 ENCOUNTER — Encounter: Payer: Self-pay | Admitting: Gastroenterology

## 2013-09-26 ENCOUNTER — Ambulatory Visit: Payer: BC Managed Care – PPO

## 2013-09-26 ENCOUNTER — Encounter: Payer: Self-pay | Admitting: Internal Medicine

## 2013-09-27 ENCOUNTER — Ambulatory Visit (INDEPENDENT_AMBULATORY_CARE_PROVIDER_SITE_OTHER): Payer: BC Managed Care – PPO

## 2013-09-27 ENCOUNTER — Other Ambulatory Visit (INDEPENDENT_AMBULATORY_CARE_PROVIDER_SITE_OTHER): Payer: BC Managed Care – PPO

## 2013-09-27 DIAGNOSIS — J309 Allergic rhinitis, unspecified: Secondary | ICD-10-CM

## 2013-09-27 DIAGNOSIS — E890 Postprocedural hypothyroidism: Secondary | ICD-10-CM

## 2013-09-27 LAB — T4, FREE: Free T4: 0.84 ng/dL (ref 0.60–1.60)

## 2013-09-27 LAB — TSH: TSH: 0.32 u[IU]/mL — ABNORMAL LOW (ref 0.35–4.50)

## 2013-10-01 ENCOUNTER — Encounter: Payer: Self-pay | Admitting: Endocrinology

## 2013-10-01 ENCOUNTER — Ambulatory Visit (INDEPENDENT_AMBULATORY_CARE_PROVIDER_SITE_OTHER): Payer: BC Managed Care – PPO | Admitting: Endocrinology

## 2013-10-01 VITALS — BP 128/83 | HR 80 | Resp 16 | Ht 63.0 in | Wt 170.8 lb

## 2013-10-01 DIAGNOSIS — E0500 Thyrotoxicosis with diffuse goiter without thyrotoxic crisis or storm: Secondary | ICD-10-CM

## 2013-10-01 DIAGNOSIS — I10 Essential (primary) hypertension: Secondary | ICD-10-CM

## 2013-10-01 DIAGNOSIS — E890 Postprocedural hypothyroidism: Secondary | ICD-10-CM

## 2013-10-01 MED ORDER — TRIAMTERENE-HCTZ 37.5-25 MG PO TABS: 0.5000 | ORAL_TABLET | Freq: Every day | ORAL | Status: DC

## 2013-10-01 NOTE — Progress Notes (Addendum)
Patient ID: Ashley Larsen, female   DOB: 10-Mar-1950, 63 y.o.   MRN: [id number]   Reason for Appointment:  Post ablative hypothyroidism, followup   History of Present Illness:    She had hyperthyroidism first diagnosed in 01/2013 and treated with radioactive iodine on 05/03/13 with 12 mCi She became hypothyroid subsequently and in 6/15 with a free T4 level of 0.57 she was started on 112 mcg Synthroid  Recent history:  She thinks her fatigue with the supplement was somewhat better but was still complaining of feeling tired and cold Her dose was reduced to 88 mcg because of relatively high free T4 of 1.4 and low TSH on 08/08/13 She is not complaining of as much fatigue but does wake up feeling tired No palpitations, shakiness or cold or heat intolerance She has been regular with her 88 mcg Synthroid dose  Free T4 is normal and TSH is nearly normal now Her weight appears to be slightly higher She is taking her Synthroid regularly and not complaining about any shakiness or palpitations  Wt Readings from Last 3 Encounters:  10/01/13 170 lb 12.8 oz (77.474 kg)  08/20/13 167 lb 12.8 oz (76.114 kg)  08/08/13 166 lb (75.297 kg)   Labs:   Lab Results  Component Value Date   TSH 0.32* 09/27/2013   TSH 0.03* 08/08/2013   TSH 0.03* 08/08/2013   TSH 0.06* 07/16/2013   FREET4 0.84 09/27/2013   FREET4 1.40 08/08/2013   FREET4 0.57* 07/16/2013        OPHTHALMOPATHY:  She is complaining of her left eye being prominent and tearing She had been seeing an ophthalmologist but has not been given any specific treatment She is trying to deliver the head of her bed on 2 pillows     Medication List       This list is accurate as of: 10/01/13 11:18 AM.  Always use your most recent med list.               cholecalciferol 1000 UNITS tablet  Commonly known as:  VITAMIN D  Take 1 tablet (1,000 Units total) by mouth  daily.     fexofenadine 180 MG tablet  Commonly known as:  ALLEGRA  Take 1 tablet (180 mg total) by mouth daily as needed for allergies.     HYDROcodone-acetaminophen 5-325 MG per tablet  Commonly known as:  NORCO/VICODIN  Take 1 tablet by mouth every 8 (eight) hours as needed for severe pain. Please fill on or after 08/03/13     levothyroxine 88 MCG tablet  Commonly known as:  SYNTHROID, LEVOTHROID  Take 1 tablet (88 mcg total) by mouth daily.     promethazine-codeine 6.25-10 MG/5ML syrup  Commonly known as:  PHENERGAN with CODEINE  take 1 teaspoonful by mouth every 6 hours if needed for cough     triamcinolone cream 0.1 %  Commonly known as:  KENALOG  Apply 1 application topically 3 (three) times daily.            Past Medical History  Diagnosis Date  . Asthma   . Allergic rhinitis   . Insomnia   . Paresthesia   . GERD (gastroesophageal reflux disease)   . Diverticulosis   . Ovarian cyst   . Alopecia   . IBS (irritable bowel syndrome)   . Hypertension   . COPD (chronic obstructive pulmonary disease)   . Hyperthyroidism     Past Surgical History  Procedure Laterality Date  . Tubal ligation    .  Knee arthroscopy      Left  . Oophorectomy      Left  . Rotator cuff repair    . Colonoscopy      Family History  Problem Relation Age of Onset  . Hypertension    . Cancer Sister 63    colon  . Diabetes Sister   . Cancer Brother     brother  . Colon polyps Sister     and Brother  . Cancer Sister 67     colon  . Kidney disease Brother   . Diabetes Brother   . Diabetes Brother   . Heart disease Father   . Other Mother     sclerdermia  . Diabetes Paternal Grandfather   . Colon cancer Sister     Social History:  reports that she has been smoking Cigarettes.  She has a 6 pack-year smoking history. She has never used smokeless tobacco. She reports that she does not drink alcohol or use illicit drugs.  Allergies:  Allergies  Allergen Reactions  .  Metronidazole     REACTION: upset stomach  . Oxycodone-Acetaminophen     Review of Systems:  She has a long-standing history of thinning hair and has been told by dermatologist to have alopecia HYPERTENSION: Has history of high blood pressure Has been off metoprolol was given to her for control of her Hyperthyroidism  History of hypokalemia on HCTZ which was stopped and blood pressure appears to be trending upwards      Examination:   BP 128/83  Pulse 80  Resp 16  Ht 5\' 3"  (1.6 m)  Wt 170 lb 12.8 oz (77.474 kg)  BMI 30.26 kg/m2  SpO2 97%    Eyes:  since she has prominence of the left eye with mild lid retraction, has some swelling of both eyelids especially left She has minimal diplopia on looking to the left but no divergence of the eyes are obvious restriction of eye movement ocular movements in either direction She has excessive tearing present and mild chemosis of the left eye No conjunctival erythema Neurological: REFLEXES: at biceps are normal, no tremor     Assessment/Plan:   Post-ablative hypothyroidism  She has  post ablative hypothyroidism and is less symptomatic with using 88 mcg and her levels are nearly normal She does have mild fatigue but not clear this is related to hypothyroidism  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

## 2013-10-01 NOTE — Patient Instructions (Signed)
Artificial tears 4x daily  Maxzide 1/2 daily

## 2013-10-04 ENCOUNTER — Ambulatory Visit (INDEPENDENT_AMBULATORY_CARE_PROVIDER_SITE_OTHER): Payer: BC Managed Care – PPO

## 2013-10-04 DIAGNOSIS — J309 Allergic rhinitis, unspecified: Secondary | ICD-10-CM

## 2013-10-11 ENCOUNTER — Ambulatory Visit (INDEPENDENT_AMBULATORY_CARE_PROVIDER_SITE_OTHER): Payer: BC Managed Care – PPO

## 2013-10-11 DIAGNOSIS — J309 Allergic rhinitis, unspecified: Secondary | ICD-10-CM

## 2013-10-18 ENCOUNTER — Ambulatory Visit (INDEPENDENT_AMBULATORY_CARE_PROVIDER_SITE_OTHER): Payer: BC Managed Care – PPO

## 2013-10-18 DIAGNOSIS — J309 Allergic rhinitis, unspecified: Secondary | ICD-10-CM

## 2013-10-23 ENCOUNTER — Encounter: Payer: Self-pay | Admitting: Gastroenterology

## 2013-10-25 ENCOUNTER — Ambulatory Visit (INDEPENDENT_AMBULATORY_CARE_PROVIDER_SITE_OTHER): Payer: BC Managed Care – PPO

## 2013-10-25 DIAGNOSIS — J309 Allergic rhinitis, unspecified: Secondary | ICD-10-CM

## 2013-11-01 ENCOUNTER — Ambulatory Visit (INDEPENDENT_AMBULATORY_CARE_PROVIDER_SITE_OTHER): Payer: BC Managed Care – PPO

## 2013-11-01 DIAGNOSIS — J309 Allergic rhinitis, unspecified: Secondary | ICD-10-CM

## 2013-11-09 ENCOUNTER — Ambulatory Visit (INDEPENDENT_AMBULATORY_CARE_PROVIDER_SITE_OTHER): Payer: BC Managed Care – PPO

## 2013-11-09 ENCOUNTER — Encounter: Payer: Self-pay | Admitting: Internal Medicine

## 2013-11-09 ENCOUNTER — Ambulatory Visit (INDEPENDENT_AMBULATORY_CARE_PROVIDER_SITE_OTHER): Payer: BC Managed Care – PPO | Admitting: Internal Medicine

## 2013-11-09 VITALS — BP 140/86 | HR 88 | Temp 98.3°F | Wt 178.0 lb

## 2013-11-09 DIAGNOSIS — R634 Abnormal weight loss: Secondary | ICD-10-CM

## 2013-11-09 DIAGNOSIS — E890 Postprocedural hypothyroidism: Secondary | ICD-10-CM

## 2013-11-09 DIAGNOSIS — J449 Chronic obstructive pulmonary disease, unspecified: Secondary | ICD-10-CM

## 2013-11-09 DIAGNOSIS — J309 Allergic rhinitis, unspecified: Secondary | ICD-10-CM

## 2013-11-09 DIAGNOSIS — H0520 Unspecified exophthalmos: Secondary | ICD-10-CM

## 2013-11-09 DIAGNOSIS — H5713 Ocular pain, bilateral: Secondary | ICD-10-CM

## 2013-11-09 DIAGNOSIS — H5710 Ocular pain, unspecified eye: Secondary | ICD-10-CM

## 2013-11-09 MED ORDER — METHYLPREDNISOLONE ACETATE 80 MG/ML IJ SUSP
80.0000 mg | Freq: Once | INTRAMUSCULAR | Status: AC
  Administered 2013-11-09: 80 mg via INTRAMUSCULAR

## 2013-11-09 MED ORDER — HYDROCODONE-ACETAMINOPHEN 5-325 MG PO TABS: 1.0000 | ORAL_TABLET | Freq: Three times a day (TID) | ORAL | Status: DC | PRN

## 2013-11-09 MED ORDER — SELENIUM 100 MCG PO TABS: ORAL_TABLET | ORAL | Status: DC

## 2013-11-09 NOTE — Progress Notes (Signed)
   Subjective:    HPI  F/u eye swelling w/blurred vision, rash x 2 mo. She was seen by Dr R.  F/u allergies - worse, LBP - better, knee pain, occ cough Nl BP at home  BP Readings from Last 3 Encounters:  11/09/13 140/86  10/01/13 128/83  08/20/13 134/86   Wt Readings from Last 3 Encounters:  11/09/13 178 lb (80.74 kg)  10/01/13 170 lb 12.8 oz (77.474 kg)  08/20/13 167 lb 12.8 oz (76.114 kg)      Review of Systems  Constitutional: Positive for fatigue. Negative for chills, activity change, appetite change and unexpected weight change.  HENT: Positive for congestion and rhinorrhea. Negative for ear discharge, mouth sores and sinus pressure.   Eyes: Positive for pain, discharge, redness and visual disturbance.  Respiratory: Positive for cough. Negative for chest tightness.   Gastrointestinal:       GERD  Genitourinary: Negative for difficulty urinating and vaginal pain.  Musculoskeletal: Negative for gait problem.  Skin: Negative for pallor and rash.  Neurological: Negative for dizziness and tremors.  Psychiatric/Behavioral: Positive for dysphoric mood. Negative for suicidal ideas, confusion and sleep disturbance. The patient is not nervous/anxious.        Objective:   Physical Exam  Constitutional: She appears well-developed. No distress.  Obese  HENT:  Head: Normocephalic.  Right Ear: External ear normal.  Left Ear: External ear normal.  Mouth/Throat: Oropharynx is clear and moist.  Swollen nasal mucosa  Eyes: Conjunctivae are normal. Pupils are equal, round, and reactive to light. Right eye exhibits no discharge. Left eye exhibits no discharge.  Neck: Normal range of motion. Neck supple. No JVD present. No tracheal deviation present. Thyromegaly (mild, NT) present.  Cardiovascular: Normal rate, regular rhythm and normal heart sounds.   Pulmonary/Chest: No stridor. No respiratory distress. She has no wheezes.  Abdominal: Soft. Bowel sounds are normal. She exhibits  no distension and no mass. There is no tenderness. There is no rebound and no guarding.  Musculoskeletal: She exhibits no edema and no tenderness.  Lymphadenopathy:    She has no cervical adenopathy.  Neurological: She displays normal reflexes. No cranial nerve deficit. She exhibits normal muscle tone. Coordination normal.  Skin: Rash noted. There is erythema.  Psychiatric: She has a normal mood and affect. Her behavior is normal. Judgment and thought content normal.     Lab Results  Component Value Date   WBC 3.6* 01/31/2013   HGB 10.9* 01/31/2013   HCT 32.3* 01/31/2013   PLT 185.0 01/31/2013   GLUCOSE 85 06/18/2013   CHOL 113 01/31/2013   TRIG 89.0 01/31/2013   HDL 34.80* 01/31/2013   LDLDIRECT 185.7 01/19/2012   LDLCALC 60 01/31/2013   ALT 23 05/30/2013   AST 20 05/30/2013   NA 141 06/18/2013   K 3.1* 06/18/2013   CL 105 06/18/2013   CREATININE 0.7 06/18/2013   BUN 11 06/18/2013   CO2 28 06/18/2013   TSH 0.32* 09/27/2013   INR 0.9 01/10/2007   HGBA1C 6.0 08/26/2008        Assessment & Plan:

## 2013-11-09 NOTE — Progress Notes (Signed)
Pre visit review using our clinic review tool, if applicable. No additional management support is needed unless otherwise documented below in the visit note. 

## 2013-11-11 ENCOUNTER — Encounter: Payer: Self-pay | Admitting: Internal Medicine

## 2013-11-11 DIAGNOSIS — E890 Postprocedural hypothyroidism: Secondary | ICD-10-CM | POA: Insufficient documentation

## 2013-11-11 NOTE — Assessment & Plan Note (Addendum)
Since 6/15 B - ?etiology --- B eye Grave's ophthalmopathy R/o CTD C/o eye swelling w/blurred vision, rash x months  Will try Selenium po

## 2013-11-11 NOTE — Assessment & Plan Note (Signed)
Continue with current prescription therapy as reflected on the Med list.  

## 2013-11-11 NOTE — Assessment & Plan Note (Signed)
Wt Readings from Last 3 Encounters:  11/09/13 178 lb (80.74 kg)  10/01/13 170 lb 12.8 oz (77.474 kg)  08/20/13 167 lb 12.8 oz (76.114 kg)

## 2013-11-11 NOTE — Assessment & Plan Note (Signed)
Smoking discussed 

## 2013-11-12 ENCOUNTER — Telehealth: Payer: Self-pay | Admitting: Internal Medicine

## 2013-11-12 NOTE — Telephone Encounter (Signed)
emmi mailed  °

## 2013-11-15 ENCOUNTER — Ambulatory Visit (INDEPENDENT_AMBULATORY_CARE_PROVIDER_SITE_OTHER): Payer: BC Managed Care – PPO

## 2013-11-15 DIAGNOSIS — J309 Allergic rhinitis, unspecified: Secondary | ICD-10-CM

## 2013-11-22 ENCOUNTER — Ambulatory Visit (INDEPENDENT_AMBULATORY_CARE_PROVIDER_SITE_OTHER): Payer: BC Managed Care – PPO

## 2013-11-22 DIAGNOSIS — J309 Allergic rhinitis, unspecified: Secondary | ICD-10-CM

## 2013-11-23 ENCOUNTER — Encounter: Payer: Self-pay | Admitting: Gastroenterology

## 2013-11-23 ENCOUNTER — Other Ambulatory Visit: Payer: Self-pay

## 2013-11-23 DIAGNOSIS — Z1239 Encounter for other screening for malignant neoplasm of breast: Secondary | ICD-10-CM

## 2013-11-29 ENCOUNTER — Ambulatory Visit: Payer: BC Managed Care – PPO

## 2013-11-30 ENCOUNTER — Ambulatory Visit (INDEPENDENT_AMBULATORY_CARE_PROVIDER_SITE_OTHER): Payer: BC Managed Care – PPO

## 2013-11-30 DIAGNOSIS — J309 Allergic rhinitis, unspecified: Secondary | ICD-10-CM

## 2013-12-04 ENCOUNTER — Other Ambulatory Visit: Payer: Self-pay

## 2013-12-04 DIAGNOSIS — Z1231 Encounter for screening mammogram for malignant neoplasm of breast: Secondary | ICD-10-CM

## 2013-12-06 ENCOUNTER — Ambulatory Visit (INDEPENDENT_AMBULATORY_CARE_PROVIDER_SITE_OTHER): Payer: BC Managed Care – PPO

## 2013-12-06 ENCOUNTER — Ambulatory Visit
Admission: RE | Admit: 2013-12-06 | Discharge: 2013-12-06 | Disposition: A | Discharge status: Home / Self Care | Payer: BC Managed Care – PPO | Source: Ambulatory Visit

## 2013-12-06 DIAGNOSIS — J309 Allergic rhinitis, unspecified: Secondary | ICD-10-CM

## 2013-12-06 DIAGNOSIS — Z1231 Encounter for screening mammogram for malignant neoplasm of breast: Secondary | ICD-10-CM

## 2013-12-13 ENCOUNTER — Telehealth: Payer: Self-pay | Admitting: *Deleted

## 2013-12-13 ENCOUNTER — Ambulatory Visit: Payer: BC Managed Care – PPO

## 2013-12-13 NOTE — Telephone Encounter (Signed)
Rf req for Prometh-codeine syrup. 5 ml po q 6 hr prn cough. Last filled 08/17/2013 ok to Rf?

## 2013-12-14 ENCOUNTER — Other Ambulatory Visit: Payer: Self-pay

## 2013-12-14 ENCOUNTER — Ambulatory Visit (INDEPENDENT_AMBULATORY_CARE_PROVIDER_SITE_OTHER): Payer: BC Managed Care – PPO

## 2013-12-14 DIAGNOSIS — J309 Allergic rhinitis, unspecified: Secondary | ICD-10-CM

## 2013-12-14 MED ORDER — PROMETHAZINE-CODEINE 6.25-10 MG/5ML PO SYRP: ORAL_SOLUTION | ORAL | Status: DC

## 2013-12-14 NOTE — Telephone Encounter (Signed)
OK to fill this prescription with additional refills x0 Thank you!  

## 2013-12-20 ENCOUNTER — Ambulatory Visit: Payer: BC Managed Care – PPO

## 2013-12-27 ENCOUNTER — Encounter: Payer: Self-pay | Admitting: Internal Medicine

## 2013-12-31 ENCOUNTER — Other Ambulatory Visit: Payer: BC Managed Care – PPO

## 2014-01-03 ENCOUNTER — Ambulatory Visit: Payer: BC Managed Care – PPO | Admitting: Endocrinology

## 2014-01-04 ENCOUNTER — Encounter: Payer: Self-pay | Admitting: Internal Medicine

## 2014-01-08 ENCOUNTER — Encounter: Payer: BC Managed Care – PPO | Admitting: Gastroenterology

## 2014-01-17 ENCOUNTER — Ambulatory Visit (INDEPENDENT_AMBULATORY_CARE_PROVIDER_SITE_OTHER): Payer: BC Managed Care – PPO

## 2014-01-17 ENCOUNTER — Ambulatory Visit: Payer: BC Managed Care – PPO

## 2014-01-17 DIAGNOSIS — Z23 Encounter for immunization: Secondary | ICD-10-CM

## 2014-01-22 ENCOUNTER — Other Ambulatory Visit (INDEPENDENT_AMBULATORY_CARE_PROVIDER_SITE_OTHER): Payer: BC Managed Care – PPO

## 2014-01-22 DIAGNOSIS — E890 Postprocedural hypothyroidism: Secondary | ICD-10-CM

## 2014-01-22 LAB — TSH: TSH: 0.3 u[IU]/mL — AB (ref 0.35–4.50)

## 2014-01-22 LAB — T4, FREE: FREE T4: 1.16 ng/dL (ref 0.60–1.60)

## 2014-01-23 ENCOUNTER — Ambulatory Visit (INDEPENDENT_AMBULATORY_CARE_PROVIDER_SITE_OTHER): Payer: BC Managed Care – PPO

## 2014-01-23 ENCOUNTER — Ambulatory Visit (INDEPENDENT_AMBULATORY_CARE_PROVIDER_SITE_OTHER): Payer: BC Managed Care – PPO | Admitting: Internal Medicine

## 2014-01-23 ENCOUNTER — Ambulatory Visit (INDEPENDENT_AMBULATORY_CARE_PROVIDER_SITE_OTHER)
Admission: RE | Admit: 2014-01-23 | Discharge: 2014-01-23 | Disposition: A | Discharge status: Home / Self Care | Payer: BC Managed Care – PPO | Source: Ambulatory Visit | Attending: Internal Medicine | Admitting: Internal Medicine

## 2014-01-23 ENCOUNTER — Encounter: Payer: Self-pay | Admitting: Internal Medicine

## 2014-01-23 VITALS — BP 134/72 | HR 99 | Ht 65.5 in | Wt 189.0 lb

## 2014-01-23 DIAGNOSIS — J309 Allergic rhinitis, unspecified: Secondary | ICD-10-CM

## 2014-01-23 DIAGNOSIS — Z720 Tobacco use: Secondary | ICD-10-CM

## 2014-01-23 DIAGNOSIS — J302 Other seasonal allergic rhinitis: Secondary | ICD-10-CM

## 2014-01-23 DIAGNOSIS — J3089 Other allergic rhinitis: Secondary | ICD-10-CM

## 2014-01-23 DIAGNOSIS — F17200 Nicotine dependence, unspecified, uncomplicated: Secondary | ICD-10-CM

## 2014-01-23 DIAGNOSIS — H1045 Other chronic allergic conjunctivitis: Secondary | ICD-10-CM

## 2014-01-23 DIAGNOSIS — J449 Chronic obstructive pulmonary disease, unspecified: Secondary | ICD-10-CM

## 2014-01-23 MED ORDER — METHYLPREDNISOLONE ACETATE 80 MG/ML IJ SUSP
80.0000 mg | Freq: Once | INTRAMUSCULAR | Status: AC
  Administered 2014-01-23: 80 mg via INTRAMUSCULAR

## 2014-01-23 NOTE — Progress Notes (Signed)
Subjective:    Patient ID: Ashley Larsen, female    DOB: 10-19-1950, 63 y.o.   MRN: [id number] HPI  10/20/10 - 63 yof smoker followed for COPD, tobacco use, allergic rhinitis, complicated by insomnia, GERD, HBP  Last here July 28, 2009-  Still smoking 6-7 cigarettes/ day. She had a friend who quit with hypnosis so she would like to try that. Wants flu shot.  Rhinitis in last 2 weeks with nasal congestion and pressure- not headache or green.  Denies dyspnea, chest pain, swelling. Husband has liver cancer.  Never needing her inhalers- would like to keep a rescue inhaler available   09/03/11- Dr Gwenette Greet The patient comes in today for an acute sick visit.  She is normally followed by Dr. Annamaria Boots for COPD.  A review of the chart shows that she has required multiple rounds of antibiotics this year so far, and unfortunately continues to smoke every day.  She denies noncompliance with her symbicort.  She notes a one-week history of worsening nasal congestion, postnasal drip, sinus pressure, and small-to-moderate quantities of purulent mucus from her nose.  She has also had a sore throat.  She denies any significant chest congestion, and feels that her breathing is at baseline.  12/05/12- 63 yof smoker followed for COPD, tobacco use, allergic rhinitis, complicated by insomnia, GERD, HBP FOLLOWS FOR: c/o nasal congestion-no color,sob with exertion,cough-clear,denies wheezing cp or tightness Says she can tell if she skips her allergy shot, continuing Allergy Vaccine 1:10 [facility]. Some nasal congestion and pressure in her years, mild cough at night. Rarely needs rescue inhaler. [facility] flu vaccine [facility].  01/23/14- 63 yof smoker followed for COPD, tobacco use, allergic rhinitis, complicated by insomnia, GERD, HBP follows for: yearly rov.  pt has no breathing complaints.  tolerating allergy injections well.  Allergy vaccine 1:10 [facility] Dr. treating since May for "watery" eyes with no diagnosis offered.  Nasal congestion. Right ear stuffy.Smoking less. CXR 12/05/12 IMPRESSION: No active cardiopulmonary disease. Electronically Signed  By: Donavan Burnet M.D. On: 12/05/2012 09:49  ROS-see HPI Constitutional:   No-   weight loss, night sweats, fevers, chills, fatigue, lassitude. HEENT:   No-  headaches, difficulty swallowing, tooth/dental problems, sore throat,       No-  sneezing, itching, ear ache, +nasal congestion, post nasal drip,  CV:  No-   chest pain, orthopnea, PND, swelling in lower extremities, anasarca,  dizziness, palpitations Resp: No-   shortness of breath with exertion or at rest.              No-   productive cough,  No non-productive cough,  No- coughing up of blood.              No-   change in color of mucus.  No- wheezing.   Skin: No-   rash or lesions. GI:  No-   heartburn, indigestion, abdominal pain, nausea, vomiting,  GU: . MS:  No-   joint pain or swelling.   Neuro-     nothing unusual Psych:  No- change in mood or affect. No depression or anxiety.  No memory loss.  Objective:  OBJ- Physical Exam General- Alert, Oriented, Affect-appropriate, Distress- none acute Skin- rash-none, lesions- none, excoriation- none Lymphadenopathy- none Head- atraumatic            Eyes- Gross vision intact, PERRLA, conjunctivae and secretions clear,  watery            Ears- Hearing, canals, TMs-normal            Nose- +turbinate edema, no-Septal dev, mucus, polyps, erosion, perforation             Throat- Mallampati II , mucosa clear , drainage- none, tonsils- atrophic Neck- flexible , trachea midline, no stridor , thyroid nl, carotid no bruit Chest - symmetrical excursion , unlabored           Heart/CV- RRR , no murmur , no gallop  , no rub, nl s1 s2                           - JVD- none , edema- none, stasis changes- none, varices- none           Lung- clear to P&A, wheeze- none, cough- none , dullness-none, rub- none           Chest wall-   Abd- Br/ Gen/ Rectal- Not done, not indicated Extrem- cyanosis- none, clubbing, none, atrophy- none, strength- nl Neuro- grossly intact to observation    Assessment & Plan:

## 2014-01-23 NOTE — Patient Instructions (Signed)
Order- CXR  Dx COPD emphysema, tobacco use  Neb neo nasal    Seasonal and perennial allergic rhinitis  Depo 80  Suggestions for otc allergy eye drops   Naphcon, AlleRx, Visine AC

## 2014-01-28 ENCOUNTER — Ambulatory Visit (INDEPENDENT_AMBULATORY_CARE_PROVIDER_SITE_OTHER): Payer: BC Managed Care – PPO | Admitting: Endocrinology

## 2014-01-28 ENCOUNTER — Encounter: Payer: Self-pay | Admitting: Endocrinology

## 2014-01-28 VITALS — BP 130/74 | HR 92 | Temp 98.2°F | Resp 14 | Ht 65.5 in | Wt 184.8 lb

## 2014-01-28 DIAGNOSIS — I10 Essential (primary) hypertension: Secondary | ICD-10-CM

## 2014-01-28 DIAGNOSIS — E890 Postprocedural hypothyroidism: Secondary | ICD-10-CM

## 2014-01-28 DIAGNOSIS — E0500 Thyrotoxicosis with diffuse goiter without thyrotoxic crisis or storm: Secondary | ICD-10-CM

## 2014-01-28 NOTE — Progress Notes (Signed)
Patient ID: Ashley Larsen, female   DOB: [birth year], 63 y.o.   MRN: [id number]   Reason for Appointment:  Post ablative hypothyroidism, followup   History of Present Illness:   She had hyperthyroidism first diagnosed in 01/2013 and treated with radioactive iodine on 05/03/13 with 12 mCi She became hypothyroid subsequently and in 6/15 with a free T4 level of 0.57 she was started on 112 mcg Synthroid  Recent history: Her dose was reduced to 88 mcg in 07/2013 because of relatively high free T4 of 1.4 and low TSH  This has been continued unchanged She had been complaining about some fatigue and cold intolerance since being on Synthroid This has been continuing to some extent She has gained weight progressively but this is possibly because she is now not working and previously was walking consistently No palpitations, shakiness or heat intolerance  She has been regular with her 88 mcg Synthroid dose Free T4 is normal although relatively higher than the last time and TSH is nearly normal again  Wt Readings from Last 3 Encounters:  01/28/14 184 lb 12.8 oz (83.825 kg)  01/23/14 189 lb (85.73 kg)  11/09/13 178 lb (80.74 kg)   Labs:   Lab Results  Component Value Date   TSH 0.30* 01/22/2014   TSH 0.32* 09/27/2013   TSH 0.03* 08/08/2013   TSH 0.03* 08/08/2013   FREET4 1.16 01/22/2014   FREET4 0.84 09/27/2013   FREET4 1.40 08/08/2013        OPHTHALMOPATHY:  She is  again complaining of her left eyehaving excessive  Tearing She was told to use artificial tears but has been only using allergy drops Also at times she will have blurred vision especially with trying to focus especially on looking up from reading She was given the names of neuro-ophthalmology this to see but she has not seen anybody but is going to see a doctor at [facility] tomorrow She was started on a half tablet of  Maxzide but she does not find this useful  She is keeping  the head of her bed  elevated on 2 pillows     Medication List       This list is accurate as of: 01/28/14  3:12 PM.  Always use your most recent med list.               cholecalciferol 1000 UNITS tablet  Commonly known as:  VITAMIN D  Take 1 tablet (1,000 Units total) by mouth daily.     fexofenadine 180 MG tablet  Commonly known as:  ALLEGRA  Take 1 tablet (180 mg total) by mouth daily as needed for allergies.     HYDROcodone-acetaminophen 5-325 MG per tablet  Commonly known as:  NORCO/VICODIN  Take 1 tablet by mouth every 8 (eight) hours as needed for severe pain. Please fill on or after 08/03/13     levothyroxine 88 MCG tablet  Commonly known as:  SYNTHROID, LEVOTHROID  Take 1 tablet (88 mcg total) by mouth daily.     promethazine-codeine 6.25-10 MG/5ML syrup  Commonly known as:  PHENERGAN with CODEINE  take 1 teaspoonful by mouth every 6 hours if needed for cough     Selenium 100 MCG Tabs  1 po bid for your eye condition     triamcinolone cream 0.1 %  Commonly known as:  KENALOG  Apply 1 application topically 3 (three) times daily.     triamterene-hydrochlorothiazide 37.5-25 MG per tablet  Commonly known as:  MAXZIDE-25  Take 0.5 tablets by mouth daily.            Past Medical History  Diagnosis Date  . Asthma   . Allergic rhinitis   . Insomnia   . Paresthesia   . GERD (gastroesophageal reflux disease)   . Diverticulosis   . Ovarian cyst   . Alopecia   . IBS (irritable bowel syndrome)   . Hypertension   . COPD (chronic obstructive pulmonary disease)   . Hyperthyroidism     Past Surgical History  Procedure Laterality Date  . Tubal ligation    . Knee arthroscopy      Left  . Oophorectomy      Left  . Rotator cuff repair    . Colonoscopy      Family History  Problem Relation Age of Onset  . Hypertension    . Cancer Sister 19    colon  . Diabetes Sister   . Cancer Brother      brother  . Colon polyps Sister     and Brother  . Cancer Sister 70     colon  . Kidney disease Brother   . Diabetes Brother   . Diabetes Brother   . Heart disease Father   . Other Mother     sclerdermia  . Diabetes Paternal Grandfather   . Colon cancer Sister     Social History:  reports that she has been smoking Cigarettes.  She has a 6 pack-year smoking history. She has never used smokeless tobacco. She reports that she does not drink alcohol or use illicit drugs.  Allergies:  Allergies  Allergen Reactions  . Metronidazole     REACTION: upset stomach  . Oxycodone-Acetaminophen     Review of Systems:  She has a long-standing history of thinning hair and this is better now  HYPERTENSION: Has history of high blood pressure  which is mild    History of hypokalemia on HCTZ which was stopped and she is now taking Maxzide, recently seen by PCP    Examination:   BP 130/74 mmHg  Pulse 92  Temp(Src) 98.2 F (36.8 C)  Resp 14  Ht 5' 5.5" (1.664 m)  Wt 184 lb 12.8 oz (83.825 kg)  BMI 30.27 kg/m2  SpO2 96%    Eyes:  she  has some swelling of both eyeli, slightly more on the left She does not have any significant proptosis No lid retraction or stare She has diplopia on looking to the left  and has decreased lateral movement of the left eye.    She has mild  divergence of the eyes  No conjunctival erythema Neurological: REFLEXES: at biceps are normal No peripheral edema     Assessment/Plan:   Post-ablative hypothyroidism  She has  post ablative hypothyroidism and thyroid levels are nearly normal with  using 88 mcg  Not clear why she has some fatigue and cold intolerance since she is clearly not hypothyroid Her TSH may be coming up relatively more slowly because of past hyperthyroidism and her free T4 is normal Her weight gain is likely to be from her relative inactivity recently  Have recommended that she stay on 88 g except reduce the dose by half tablet weekly.   Will need to follow-up again in 2 months  Weight gain: I have asked her to start regular walking for exercise  Graves ophthalmopathy:   She has significant ophthalmopathy with involvement of the left ocular muscles and some tendency to diplopia She is going  to see the ophthalmologist tomorrow and will defer any other treatment Not clear if she is benefiting from selenium that was started Discussed that the only options medically would be to start her on high-dose steroids and discussed possible side effects.  She is not wanting to do this no especially with the possibility of weight gain  Have reminded her to start  using OTC artificial tears  She will increase her  Maxzide to follow tablet also to see if it will help her eyelid edema Will need to monitor her potassium with this also She will continue elevating the head of her bed with 2 pillows  Hypertension: Well controlled    Followup in 2 months   Alberta Lenhard 01/28/2014, 3:12 PM

## 2014-01-28 NOTE — Patient Instructions (Signed)
Take 1 pill daily and 1/2 pill 1x per week

## 2014-01-31 ENCOUNTER — Ambulatory Visit (INDEPENDENT_AMBULATORY_CARE_PROVIDER_SITE_OTHER): Payer: BC Managed Care – PPO

## 2014-01-31 DIAGNOSIS — J309 Allergic rhinitis, unspecified: Secondary | ICD-10-CM

## 2014-02-03 ENCOUNTER — Encounter: Payer: Self-pay | Admitting: Internal Medicine

## 2014-02-03 DIAGNOSIS — H1045 Other chronic allergic conjunctivitis: Secondary | ICD-10-CM | POA: Insufficient documentation

## 2014-02-03 NOTE — Assessment & Plan Note (Signed)
Not clear if this is allergy or irritant effect Plan-discussed OTC allergy eyedrops. Nasal decongestant nebulizer treatment, Depo-Medrol

## 2014-02-03 NOTE — Assessment & Plan Note (Addendum)
Emphasis on smoking cessation. Plan chest x-ray

## 2014-02-03 NOTE — Assessment & Plan Note (Signed)
Continue counseling

## 2014-02-03 NOTE — Assessment & Plan Note (Signed)
Okay to continue allergy vaccine. Discussed interaction between nasal congestion and lacrimation.

## 2014-02-06 ENCOUNTER — Ambulatory Visit (INDEPENDENT_AMBULATORY_CARE_PROVIDER_SITE_OTHER): Payer: Self-pay

## 2014-02-06 DIAGNOSIS — J309 Allergic rhinitis, unspecified: Secondary | ICD-10-CM

## 2014-02-11 ENCOUNTER — Encounter: Payer: Self-pay | Admitting: Internal Medicine

## 2014-02-11 ENCOUNTER — Ambulatory Visit (INDEPENDENT_AMBULATORY_CARE_PROVIDER_SITE_OTHER): Payer: BC Managed Care – PPO | Admitting: Internal Medicine

## 2014-02-11 VITALS — BP 120/90 | HR 109 | Temp 98.3°F | Ht 65.5 in | Wt 180.0 lb

## 2014-02-11 DIAGNOSIS — R634 Abnormal weight loss: Secondary | ICD-10-CM

## 2014-02-11 DIAGNOSIS — M25561 Pain in right knee: Secondary | ICD-10-CM

## 2014-02-11 DIAGNOSIS — M25562 Pain in left knee: Secondary | ICD-10-CM

## 2014-02-11 DIAGNOSIS — M545 Low back pain, unspecified: Secondary | ICD-10-CM

## 2014-02-11 DIAGNOSIS — H5713 Ocular pain, bilateral: Secondary | ICD-10-CM

## 2014-02-11 MED ORDER — TRIAMTERENE-HCTZ 37.5-25 MG PO TABS: 1.0000 | ORAL_TABLET | Freq: Every day | ORAL | Status: DC

## 2014-02-11 MED ORDER — HYDROCODONE-ACETAMINOPHEN 5-325 MG PO TABS: 1.0000 | ORAL_TABLET | Freq: Three times a day (TID) | ORAL | Status: DC | PRN

## 2014-02-11 MED ORDER — PROMETHAZINE HCL 25 MG PO TABS: 25.0000 mg | ORAL_TABLET | Freq: Three times a day (TID) | ORAL | Status: DC | PRN

## 2014-02-11 NOTE — Assessment & Plan Note (Signed)
aggravated by her job - standing all day; 2015 - better since she retired

## 2014-02-11 NOTE — Assessment & Plan Note (Signed)
F/U w/Dr R.

## 2014-02-11 NOTE — Assessment & Plan Note (Signed)
Continue with current prn prescription therapy as reflected on the Med list.  Potential benefits of a long term opioids use as well as potential risks (i.e. addiction risk, apnea etc) and complications (i.e. Somnolence, constipation and others) were explained to the patient and were aknowledged.

## 2014-02-11 NOTE — Progress Notes (Signed)
Pre visit review using our clinic review tool, if applicable. No additional management support is needed unless otherwise documented below in the visit note. 

## 2014-02-11 NOTE — Assessment & Plan Note (Signed)
Wt Readings from Last 3 Encounters:  02/11/14 180 lb (81.647 kg)  01/28/14 184 lb 12.8 oz (83.825 kg)  01/23/14 189 lb (85.73 kg)

## 2014-02-11 NOTE — Progress Notes (Signed)
   Subjective:    HPI  F/u eye swelling w/blurred vision, rash x 2 mo. She was seen by Dr R.  F/u allergies - worse, LBP - better, knee pain, occ cough Nl BP at home  BP Readings from Last 3 Encounters:  02/11/14 120/90  01/28/14 130/74  01/23/14 134/72   Wt Readings from Last 3 Encounters:  02/11/14 180 lb (81.647 kg)  01/28/14 184 lb 12.8 oz (83.825 kg)  01/23/14 189 lb (85.73 kg)      Review of Systems  Constitutional: Positive for fatigue. Negative for chills, activity change, appetite change and unexpected weight change.  HENT: Positive for congestion and rhinorrhea. Negative for ear discharge, mouth sores and sinus pressure.   Eyes: Positive for pain, discharge, redness and visual disturbance.  Respiratory: Positive for cough. Negative for chest tightness.   Gastrointestinal:       GERD  Genitourinary: Negative for difficulty urinating and vaginal pain.  Musculoskeletal: Negative for gait problem.  Skin: Negative for pallor and rash.  Neurological: Negative for dizziness and tremors.  Psychiatric/Behavioral: Positive for dysphoric mood. Negative for suicidal ideas, confusion and sleep disturbance. The patient is not nervous/anxious.        Objective:   Physical Exam  Constitutional: She appears well-developed. No distress.  HENT:  Head: Normocephalic.  Right Ear: External ear normal.  Left Ear: External ear normal.  Nose: Nose normal.  Mouth/Throat: Oropharynx is clear and moist.  Eyes: Conjunctivae are normal. Pupils are equal, round, and reactive to light. Right eye exhibits no discharge. Left eye exhibits no discharge.  Neck: Normal range of motion. Neck supple. No JVD present. No tracheal deviation present. No thyromegaly present.  Cardiovascular: Normal rate, regular rhythm and normal heart sounds.   Pulmonary/Chest: No stridor. No respiratory distress. She has no wheezes.  Abdominal: Soft. Bowel sounds are normal. She exhibits no distension and no mass.  There is no tenderness. There is no rebound and no guarding.  Musculoskeletal: She exhibits no edema or tenderness.  Lymphadenopathy:    She has no cervical adenopathy.  Neurological: She displays normal reflexes. No cranial nerve deficit. She exhibits normal muscle tone. Coordination normal.  Skin: No rash noted. No erythema.  Psychiatric: She has a normal mood and affect. Her behavior is normal. Judgment and thought content normal.     Lab Results  Component Value Date   WBC 3.6* 01/31/2013   HGB 10.9* 01/31/2013   HCT 32.3* 01/31/2013   PLT 185.0 01/31/2013   GLUCOSE 85 06/18/2013   CHOL 113 01/31/2013   TRIG 89.0 01/31/2013   HDL 34.80* 01/31/2013   LDLDIRECT 185.7 01/19/2012   LDLCALC 60 01/31/2013   ALT 23 05/30/2013   AST 20 05/30/2013   NA 141 06/18/2013   K 3.1* 06/18/2013   CL 105 06/18/2013   CREATININE 0.7 06/18/2013   BUN 11 06/18/2013   CO2 28 06/18/2013   TSH 0.30* 01/22/2014   INR 0.9 01/10/2007   HGBA1C 6.0 08/26/2008        Assessment & Plan:

## 2014-02-12 ENCOUNTER — Ambulatory Visit (INDEPENDENT_AMBULATORY_CARE_PROVIDER_SITE_OTHER): Payer: Self-pay

## 2014-02-12 DIAGNOSIS — J309 Allergic rhinitis, unspecified: Secondary | ICD-10-CM

## 2014-02-13 ENCOUNTER — Other Ambulatory Visit: Payer: Self-pay | Admitting: *Deleted

## 2014-02-13 ENCOUNTER — Ambulatory Visit (AMBULATORY_SURGERY_CENTER): Payer: Self-pay | Admitting: *Deleted

## 2014-02-13 ENCOUNTER — Ambulatory Visit (INDEPENDENT_AMBULATORY_CARE_PROVIDER_SITE_OTHER): Payer: BC Managed Care – PPO

## 2014-02-13 VITALS — Ht 65.5 in | Wt 179.0 lb

## 2014-02-13 DIAGNOSIS — J309 Allergic rhinitis, unspecified: Secondary | ICD-10-CM

## 2014-02-13 DIAGNOSIS — Z86010 Personal history of colonic polyps: Secondary | ICD-10-CM

## 2014-02-13 MED ORDER — LEVOTHYROXINE SODIUM 88 MCG PO TABS: 88.0000 ug | ORAL_TABLET | Freq: Every day | ORAL | Status: DC

## 2014-02-13 MED ORDER — MOVIPREP 100 G PO SOLR
1.0000 | Freq: Once | ORAL | Status: DC
Start: 2014-02-13 — End: 2014-02-28

## 2014-02-13 NOTE — Progress Notes (Signed)
No egg or soy allergy. No anesthesia problems.  No home O2.  No diet meds.  

## 2014-02-21 ENCOUNTER — Telehealth: Payer: Self-pay | Admitting: Internal Medicine

## 2014-02-21 ENCOUNTER — Ambulatory Visit (INDEPENDENT_AMBULATORY_CARE_PROVIDER_SITE_OTHER): Payer: 59

## 2014-02-21 DIAGNOSIS — H5710 Ocular pain, unspecified eye: Secondary | ICD-10-CM

## 2014-02-21 DIAGNOSIS — J309 Allergic rhinitis, unspecified: Secondary | ICD-10-CM

## 2014-02-21 NOTE — Telephone Encounter (Signed)
Referral to Dr Elta Guadeloupe Shapiro/Opthamologist is needed and sent to this MD [phone]

## 2014-02-22 NOTE — Telephone Encounter (Signed)
OK. Thx

## 2014-02-26 NOTE — Telephone Encounter (Signed)
Called pt no answer LMOM md place referral to Dr. Gershon Crane...Ashley Larsen

## 2014-02-26 NOTE — Telephone Encounter (Signed)
It was done on 02/22/14 Thx

## 2014-02-27 ENCOUNTER — Telehealth: Payer: Self-pay | Admitting: Internal Medicine

## 2014-02-27 NOTE — Telephone Encounter (Signed)
Entered in Error per Maud Deed.

## 2014-02-27 NOTE — Telephone Encounter (Signed)
Message created in error//[id number]//[id number]

## 2014-02-28 ENCOUNTER — Ambulatory Visit: Payer: 59

## 2014-02-28 ENCOUNTER — Ambulatory Visit (AMBULATORY_SURGERY_CENTER): Payer: 59 | Admitting: Gastroenterology

## 2014-02-28 ENCOUNTER — Encounter: Payer: Self-pay | Admitting: Gastroenterology

## 2014-02-28 VITALS — BP 121/77 | HR 81 | Temp 98.4°F | Resp 17 | Ht 65.5 in | Wt 179.0 lb

## 2014-02-28 DIAGNOSIS — K635 Polyp of colon: Secondary | ICD-10-CM

## 2014-02-28 DIAGNOSIS — D125 Benign neoplasm of sigmoid colon: Secondary | ICD-10-CM

## 2014-02-28 DIAGNOSIS — D123 Benign neoplasm of transverse colon: Secondary | ICD-10-CM

## 2014-02-28 DIAGNOSIS — Z1211 Encounter for screening for malignant neoplasm of colon: Secondary | ICD-10-CM

## 2014-02-28 DIAGNOSIS — Z800 Family history of malignant neoplasm of digestive organs: Secondary | ICD-10-CM

## 2014-02-28 MED ORDER — SODIUM CHLORIDE 0.9 % IV SOLN: 500.0000 mL | INTRAVENOUS | Status: DC

## 2014-02-28 NOTE — Progress Notes (Signed)
Called to room to assist during endoscopic procedure.  Patient ID and intended procedure confirmed with present staff. Received instructions for my participation in the procedure from the performing physician.  

## 2014-02-28 NOTE — Progress Notes (Signed)
Report to PACU, RN, vss, BBS= Clear.  

## 2014-02-28 NOTE — Op Note (Signed)
[place]  [address] [place], [address]   COLONOSCOPY PROCEDURE REPORT  PATIENT: Ashley, Larsen  MR#: [id number] BIRTHDATE: 1950-05-19 , 39  yrs. old GENDER: female ENDOSCOPIST: Ladene Artist, MD, [facility] PROCEDURE DATE:  02/28/2014 PROCEDURE:   Colonoscopy with snare polypectomy First Screening Colonoscopy - Avg.  risk and is 50 yrs.  old or older - No.  Prior Negative Screening - Now for repeat screening. N/A  History of Adenoma - Now for follow-up colonoscopy & has been > or = to 3 yrs.  N/A  Polyps Removed Today? Yes. ASA CLASS:   Class II INDICATIONS:patient's immediate family history of colon cancer-2 sisters MEDICATIONS: Monitored anesthesia care and Propofol 200 mg IV DESCRIPTION OF PROCEDURE:   After the risks benefits and alternatives of the procedure were thoroughly explained, informed consent was obtained.  The digital rectal exam revealed no abnormalities of the rectum.   The LB [id number]  endoscope was introduced through the anus and advanced to the cecum, which was identified by both the appendix and ileocecal valve. No adverse events experienced.   The quality of the prep was excellent, using MoviPrep  The instrument was then slowly withdrawn as the colon was fully examined.  COLON FINDINGS: Two sessile polyps measuring 5 mm in size were found in the sigmoid colon and transverse colon.  Polypectomies were performed with a cold snare.  The resection was complete, the polyp tissue was completely retrieved and sent to histology.   There was mild diverticulosis noted in the sigmoid colon.   The examination was otherwise normal.  Retroflexed views revealed no abnormalities. The time to cecum=2 minutes 24 seconds.  Withdrawal time=8 minutes 45 seconds.  The scope was withdrawn and the procedure completed. COMPLICATIONS: There were no immediate complications.  ENDOSCOPIC IMPRESSION: 1.   Two sessile polyps in the sigmoid colon and  transverse colon; polypectomies performed with a cold snare 2.   Mild diverticulosis in the sigmoid colon  RECOMMENDATIONS: 1.  Await pathology results 2.  Repeat colonoscopy in 3 years if polyp(s) adenomatous; otherwise 5 years 3.  High fiber diet with liberal fluid intake.  eSigned:  Ladene Artist, MD, [facility] 02/28/2014 10:59 AM

## 2014-02-28 NOTE — Patient Instructions (Signed)
Discharge instructions given. Handouts on polyps and diverticulosis. Resume previous medications. YOU HAD AN ENDOSCOPIC PROCEDURE TODAY AT THE [facility]: Refer to the procedure report that was given to you for any specific questions about what was found during the examination.  If the procedure report does not answer your questions, please call your gastroenterologist to clarify.  If you requested that your care partner not be given the details of your procedure findings, then the procedure report has been included in a sealed envelope for you to review at your convenience later.  YOU SHOULD EXPECT: Some feelings of bloating in the abdomen. Passage of more gas than usual.  Walking can help get rid of the air that was put into your GI tract during the procedure and reduce the bloating. If you had a lower endoscopy (such as a colonoscopy or flexible sigmoidoscopy) you may notice spotting of blood in your stool or on the toilet paper. If you underwent a bowel prep for your procedure, then you may not have a normal bowel movement for a few days.  DIET: Your first meal following the procedure should be a light meal and then it is ok to progress to your normal diet.  A half-sandwich or bowl of soup is an example of a good first meal.  Heavy or fried foods are harder to digest and may make you feel nauseous or bloated.  Likewise meals heavy in dairy and vegetables can cause extra gas to form and this can also increase the bloating.  Drink plenty of fluids but you should avoid alcoholic beverages for 24 hours.  ACTIVITY: Your care partner should take you home directly after the procedure.  You should plan to take it easy, moving slowly for the rest of the day.  You can resume normal activity the day after the procedure however you should NOT DRIVE or use heavy machinery for 24 hours (because of the sedation medicines used during the test).    SYMPTOMS TO REPORT IMMEDIATELY: A gastroenterologist  can be reached at any hour.  During normal business hours, 8:30 AM to 5:00 PM Monday through Friday, call [phone].  After hours and on weekends, please call the GI answering service at [phone] who will take a message and have the physician on call contact you.   Following lower endoscopy (colonoscopy or flexible sigmoidoscopy):  Excessive amounts of blood in the stool  Significant tenderness or worsening of abdominal pains  Swelling of the abdomen that is new, acute  Fever of 100F or higher  FOLLOW UP: If any biopsies were taken you will be contacted by phone or by letter within the next 1-3 weeks.  Call your gastroenterologist if you have not heard about the biopsies in 3 weeks.  Our staff will call the home number listed on your records the next business day following your procedure to check on you and address any questions or concerns that you may have at that time regarding the information given to you following your procedure. This is a courtesy call and so if there is no answer at the home number and we have not heard from you through the emergency physician on call, we will assume that you have returned to your regular daily activities without incident.  SIGNATURES/CONFIDENTIALITY: You and/or your care partner have signed paperwork which will be entered into your electronic medical record.  These signatures attest to the fact that that the information above on your After Visit Summary has been reviewed   and is understood.  Full responsibility of the confidentiality of this discharge information lies with you and/or your care-partner. 

## 2014-03-01 ENCOUNTER — Telehealth: Payer: Self-pay | Admitting: Internal Medicine

## 2014-03-01 ENCOUNTER — Telehealth: Payer: Self-pay | Admitting: *Deleted

## 2014-03-01 NOTE — Telephone Encounter (Signed)
  Follow up Call-  Call back number 02/28/2014  Post procedure Call Back phone  # [phone] hm  Permission to leave phone message Yes     Patient questions:  Do you have a fever, pain , or abdominal swelling? No. Pain Score  0 *  Have you tolerated food without any problems? Yes.    Have you been able to return to your normal activities? Yes.    Do you have any questions about your discharge instructions: Diet   No. Medications  No. Follow up visit  No.  Do you have questions or concerns about your Care? No.  Actions: * If pain score is 4 or above: No action needed, pain <4.

## 2014-03-01 NOTE — Telephone Encounter (Signed)
Pt called in requesting refill for her HYDROcodone-acetaminophen (NORCO/VICODIN) 5-325 MG per tablet [[id number]]

## 2014-03-04 NOTE — Telephone Encounter (Signed)
Please advise refill? 

## 2014-03-05 NOTE — Telephone Encounter (Signed)
OK to fill this prescription with additional refills x0 Thank you!  

## 2014-03-06 ENCOUNTER — Encounter: Payer: Self-pay | Admitting: Gastroenterology

## 2014-03-06 MED ORDER — HYDROCODONE-ACETAMINOPHEN 5-325 MG PO TABS: 1.0000 | ORAL_TABLET | Freq: Three times a day (TID) | ORAL | Status: DC | PRN

## 2014-03-06 NOTE — Telephone Encounter (Signed)
Called pt no answer LMOM rx ready for pick-up.../lmb 

## 2014-03-07 ENCOUNTER — Telehealth: Payer: Self-pay | Admitting: *Deleted

## 2014-03-07 ENCOUNTER — Other Ambulatory Visit: Payer: Self-pay | Admitting: *Deleted

## 2014-03-07 MED ORDER — PROMETHAZINE-CODEINE 6.25-10 MG/5ML PO SYRP: ORAL_SOLUTION | ORAL | Status: DC

## 2014-03-07 NOTE — Telephone Encounter (Signed)
Pt brought 03/06/2014 Hydroco/APAP 5/325 mg

## 2014-03-07 NOTE — Telephone Encounter (Signed)
Pt brought 03/06/2014 Hydroco/APAP 5/325 mg back. She states she wanted promethazine-codeine cough syrup. Ok per MD to fill 300 ml. Written Rx given to pt. Hydroco/APAP Rx shredded.

## 2014-03-14 ENCOUNTER — Ambulatory Visit (INDEPENDENT_AMBULATORY_CARE_PROVIDER_SITE_OTHER): Payer: 59

## 2014-03-14 DIAGNOSIS — J309 Allergic rhinitis, unspecified: Secondary | ICD-10-CM

## 2014-03-21 ENCOUNTER — Ambulatory Visit (INDEPENDENT_AMBULATORY_CARE_PROVIDER_SITE_OTHER): Payer: 59

## 2014-03-21 DIAGNOSIS — J309 Allergic rhinitis, unspecified: Secondary | ICD-10-CM

## 2014-03-25 ENCOUNTER — Encounter: Payer: Self-pay | Admitting: Internal Medicine

## 2014-03-27 ENCOUNTER — Other Ambulatory Visit (INDEPENDENT_AMBULATORY_CARE_PROVIDER_SITE_OTHER): Payer: 59

## 2014-03-27 DIAGNOSIS — E890 Postprocedural hypothyroidism: Secondary | ICD-10-CM

## 2014-03-27 DIAGNOSIS — I10 Essential (primary) hypertension: Secondary | ICD-10-CM

## 2014-03-27 LAB — BASIC METABOLIC PANEL
BUN: 11 mg/dL (ref 6–23)
CALCIUM: 9.5 mg/dL (ref 8.4–10.5)
CO2: 33 mEq/L — ABNORMAL HIGH (ref 19–32)
Chloride: 102 mEq/L (ref 96–112)
Creatinine, Ser: 0.76 mg/dL (ref 0.40–1.20)
GFR: 99.28 mL/min (ref >60.00)
GLUCOSE: 74 mg/dL (ref 70–99)
Potassium: 3 mEq/L — ABNORMAL LOW (ref 3.5–5.1)
Sodium: 140 mEq/L (ref 135–145)

## 2014-03-28 ENCOUNTER — Ambulatory Visit: Payer: 59

## 2014-03-28 LAB — T3, FREE: T3, Free: 3.2 pg/mL (ref 2.3–4.2)

## 2014-03-28 LAB — TSH: TSH: 0.4 u[IU]/mL (ref 0.35–4.50)

## 2014-03-28 LAB — T4, FREE: FREE T4: 1.24 ng/dL (ref 0.60–1.60)

## 2014-03-29 ENCOUNTER — Ambulatory Visit: Payer: BC Managed Care – PPO | Admitting: Internal Medicine

## 2014-04-01 ENCOUNTER — Ambulatory Visit (INDEPENDENT_AMBULATORY_CARE_PROVIDER_SITE_OTHER): Payer: 59 | Admitting: Endocrinology

## 2014-04-01 ENCOUNTER — Encounter: Payer: Self-pay | Admitting: Endocrinology

## 2014-04-01 VITALS — BP 118/78 | HR 121 | Temp 98.5°F | Resp 12 | Wt 182.0 lb

## 2014-04-01 DIAGNOSIS — E876 Hypokalemia: Secondary | ICD-10-CM

## 2014-04-01 DIAGNOSIS — E890 Postprocedural hypothyroidism: Secondary | ICD-10-CM

## 2014-04-01 MED ORDER — POTASSIUM CHLORIDE CRYS ER 20 MEQ PO TBCR: 20.0000 meq | EXTENDED_RELEASE_TABLET | Freq: Every day | ORAL | Status: DC

## 2014-04-01 NOTE — Progress Notes (Signed)
Patient ID: Ashley Larsen, female   DOB: 10-May-1950, 64 y.o.   MRN: [id number]   Reason for Appointment:  Post ablative hypothyroidism, followup   History of Present Illness:   Ashley Larsen had hyperthyroidism first diagnosed in 01/2013 and treated with radioactive iodine on 05/03/13 with 12 mCi Ashley Larsen became hypothyroid subsequently and in 6/15 with a free T4 level of 0.57 Ashley Larsen was started on 112 mcg Synthroid  Recent history: Her dose was reduced to 88 mcg in 07/2013 because of relatively high free T4 of 1.4 and low TSH  Since her TSH was still relatively low in 12/15 Ashley Larsen was told to take only 6-1/2 tablets a week This has improved her TSH level now  Ashley Larsen does not complain of unusual fatigue.  No significant weight change.  Ashley Larsen has been regular with her Synthroid dose before breakfast Free T4 is normal although relatively higher than the last time and TSH is in the normal range  Wt Readings from Last 3 Encounters:  04/01/14 182 lb (82.555 kg)  02/28/14 179 lb (81.194 kg)  02/13/14 179 lb (81.194 kg)   Labs:   Lab Results  Component Value Date   TSH 0.40 03/27/2014   TSH 0.30* 01/22/2014   TSH 0.32* 09/27/2013   FREET4 1.24 03/27/2014   FREET4 1.16 01/22/2014   FREET4 0.84 09/27/2013        OPHTHALMOPATHY:  Ashley Larsen is not complaining as much of her left eye having excessive tearing Ashley Larsen was told to use artificial tears but has not been any Also  Ashley Larsen will have blurred vision but was told to have cataract. Ashley Larsen was given the names of neuro-ophthalmology this to see but Ashley Larsen has not seen anybody   Ashley Larsen was started on a full tablet of Maxzide and her eyelid swelling is better Ashley Larsen is keeping  the head of her bed  elevated on 2 pillows     Medication List       This list is accurate as of: 04/01/14  3:46 PM.  Always use your most recent med list.               cholecalciferol 1000 UNITS  tablet  Commonly known as:  VITAMIN D  Take 1 tablet (1,000 Units total) by mouth daily.     fexofenadine 180 MG tablet  Commonly known as:  ALLEGRA  Take 1 tablet (180 mg total) by mouth daily as needed for allergies.     HYDROcodone-acetaminophen 5-325 MG per tablet  Commonly known as:  NORCO/VICODIN  Take 1 tablet by mouth every 8 (eight) hours as needed for severe pain.     levothyroxine 88 MCG tablet  Commonly known as:  SYNTHROID, LEVOTHROID  Take 1 tablet (88 mcg total) by mouth daily.     promethazine 25 MG tablet  Commonly known as:  PHENERGAN  Take 1 tablet (25 mg total) by mouth every 8 (eight) hours as needed for nausea or vomiting.     promethazine-codeine 6.25-10  MG/5ML syrup  Commonly known as:  PHENERGAN with CODEINE  take 1 teaspoonful by mouth every 6 hours if needed for cough     Selenium 100 MCG Tabs  1 po bid for your eye condition     triamcinolone cream 0.1 %  Commonly known as:  KENALOG  Apply 1 application topically 3 (three) times daily.     triamterene-hydrochlorothiazide 37.5-25 MG per tablet  Commonly known as:  MAXZIDE-25  Take 1 tablet by mouth daily.            Past Medical History  Diagnosis Date  . Asthma   . Allergic rhinitis   . Insomnia   . Paresthesia   . GERD (gastroesophageal reflux disease)   . Diverticulosis   . Ovarian cyst   . Alopecia   . IBS (irritable bowel syndrome)   . Hypertension   . COPD (chronic obstructive pulmonary disease)   . Hyperthyroidism   . Cataract     bilateral cateracts    Past Surgical History  Procedure Laterality Date  . Tubal ligation    . Knee arthroscopy      Left  . Oophorectomy      Left  . Rotator cuff repair    . Colonoscopy      Family History  Problem Relation Age of Onset  . Hypertension    . Cancer Sister 59    colon  . Diabetes Sister   . Cancer Brother     brother - stomach ca  . Stomach cancer Brother   . Colon polyps Sister     and Brother  . Cancer Sister  15     colon  . Kidney disease Brother   . Diabetes Brother   . Diabetes Brother   . Heart disease Father   . Other Mother     sclerdermia  . Diabetes Paternal Grandfather   . Colon cancer Sister   . Esophageal cancer Neg Hx   . Rectal cancer Neg Hx     Social History:  reports that Ashley Larsen has been smoking Cigarettes.  Ashley Larsen has a 5 pack-year smoking history. Ashley Larsen has never used smokeless tobacco. Ashley Larsen reports that Ashley Larsen does not drink alcohol or use illicit drugs.  Allergies:  Allergies  Allergen Reactions  . Metronidazole     REACTION: upset stomach  . Oxycodone-Acetaminophen     Review of Systems:   HYPERTENSION: Has history of high blood pressure  which is mild    History of hypokalemia on HCTZ which was stopped and Ashley Larsen is now taking Maxzide, potassium is again low   Examination:   BP 118/78 mmHg  Pulse 121  Temp(Src) 98.5 F (36.9 C) (Oral)  Resp 12  Wt 182 lb (82.555 kg)  SpO2 97%    Eyes:  mild eyelid swelling present along with mild proptosis especially left Ashley Larsen has no diplopia on looking to the left but has decreased lateral movement of the left eye.    Ashley Larsen has mild  divergence of the eyes  No conjunctival erythema Neurological: REFLEXES: at biceps are normal     Assessment/Plan:   Post-ablative hypothyroidism  Ashley Larsen has post ablative hypothyroidism and thyroid levels are  normal with  using 88 mcg, 6-1/2 tablets a week  Subjectively Ashley Larsen is feeling fairly good and looks euthyroid  Have recommended that Ashley Larsen stay on the same dose   Will need to follow-up again in 4 months   Graves ophthalmopathy:   Ashley Larsen has significant ophthalmopathy with involvement  of the left ocular muscles and incoordination of her eye muscles Ashley Larsen will discuss seeing a specialist with her ophthalmologist Also Ashley Larsen will continue Maxide 25 mg since this appears to help her eyelid edema Not clear if Ashley Larsen is benefiting from selenium  Ashley Larsen will continue elevating the head of her bed with 2  pillows  Hypertension: Well controlled  HYPOKALEMIA: Ashley Larsen will need to start potassium supplements as the level is 3.0, Ashley Larsen will follow-up with PCP next month  Followup in 2 months   Miniya Miguez 04/01/2014, 3:46 PM

## 2014-04-04 ENCOUNTER — Ambulatory Visit (INDEPENDENT_AMBULATORY_CARE_PROVIDER_SITE_OTHER): Payer: 59

## 2014-04-04 DIAGNOSIS — J309 Allergic rhinitis, unspecified: Secondary | ICD-10-CM

## 2014-04-10 ENCOUNTER — Ambulatory Visit (INDEPENDENT_AMBULATORY_CARE_PROVIDER_SITE_OTHER): Payer: 59

## 2014-04-10 DIAGNOSIS — J309 Allergic rhinitis, unspecified: Secondary | ICD-10-CM

## 2014-04-18 ENCOUNTER — Ambulatory Visit (INDEPENDENT_AMBULATORY_CARE_PROVIDER_SITE_OTHER): Payer: 59

## 2014-04-18 DIAGNOSIS — J309 Allergic rhinitis, unspecified: Secondary | ICD-10-CM

## 2014-04-25 ENCOUNTER — Ambulatory Visit (INDEPENDENT_AMBULATORY_CARE_PROVIDER_SITE_OTHER): Payer: 59

## 2014-04-25 DIAGNOSIS — J309 Allergic rhinitis, unspecified: Secondary | ICD-10-CM

## 2014-05-02 ENCOUNTER — Ambulatory Visit (INDEPENDENT_AMBULATORY_CARE_PROVIDER_SITE_OTHER): Payer: 59

## 2014-05-02 DIAGNOSIS — J309 Allergic rhinitis, unspecified: Secondary | ICD-10-CM

## 2014-05-08 ENCOUNTER — Ambulatory Visit (INDEPENDENT_AMBULATORY_CARE_PROVIDER_SITE_OTHER): Payer: 59

## 2014-05-08 DIAGNOSIS — J309 Allergic rhinitis, unspecified: Secondary | ICD-10-CM

## 2014-05-13 ENCOUNTER — Other Ambulatory Visit (INDEPENDENT_AMBULATORY_CARE_PROVIDER_SITE_OTHER): Payer: 59

## 2014-05-13 ENCOUNTER — Ambulatory Visit (INDEPENDENT_AMBULATORY_CARE_PROVIDER_SITE_OTHER): Payer: 59

## 2014-05-13 ENCOUNTER — Ambulatory Visit (INDEPENDENT_AMBULATORY_CARE_PROVIDER_SITE_OTHER): Payer: 59 | Admitting: Internal Medicine

## 2014-05-13 ENCOUNTER — Encounter: Payer: Self-pay | Admitting: Internal Medicine

## 2014-05-13 VITALS — BP 124/76 | HR 92 | Temp 98.3°F | Resp 16 | Wt 184.0 lb

## 2014-05-13 DIAGNOSIS — M5440 Lumbago with sciatica, unspecified side: Secondary | ICD-10-CM | POA: Diagnosis not present

## 2014-05-13 DIAGNOSIS — E876 Hypokalemia: Secondary | ICD-10-CM

## 2014-05-13 DIAGNOSIS — J449 Chronic obstructive pulmonary disease, unspecified: Secondary | ICD-10-CM | POA: Diagnosis not present

## 2014-05-13 DIAGNOSIS — J309 Allergic rhinitis, unspecified: Secondary | ICD-10-CM

## 2014-05-13 LAB — BASIC METABOLIC PANEL
BUN: 12 mg/dL (ref 6–23)
CO2: 34 mEq/L — ABNORMAL HIGH (ref 19–32)
Calcium: 9.6 mg/dL (ref 8.4–10.5)
Chloride: 103 mEq/L (ref 96–112)
Creatinine, Ser: 0.98 mg/dL (ref 0.40–1.20)
GFR: 74.01 mL/min (ref >60.00)
Glucose, Bld: 90 mg/dL (ref 70–99)
Potassium: 3.4 mEq/L — ABNORMAL LOW (ref 3.5–5.1)
SODIUM: 138 meq/L (ref 135–145)

## 2014-05-13 LAB — URINALYSIS
BILIRUBIN URINE: NEGATIVE
HGB URINE DIPSTICK: NEGATIVE
Ketones, ur: NEGATIVE
Leukocytes, UA: NEGATIVE
Nitrite: NEGATIVE
Specific Gravity, Urine: 1.015 (ref 1.000–1.030)
Total Protein, Urine: NEGATIVE
URINE GLUCOSE: NEGATIVE
Urobilinogen, UA: 0.2 (ref 0.0–1.0)
pH: 7 (ref 5.0–8.0)

## 2014-05-13 MED ORDER — HYDROCODONE-ACETAMINOPHEN 5-325 MG PO TABS: 1.0000 | ORAL_TABLET | Freq: Three times a day (TID) | ORAL | Status: DC | PRN

## 2014-05-13 NOTE — Assessment & Plan Note (Signed)
Smoking advice

## 2014-05-13 NOTE — Progress Notes (Signed)
   Subjective:    HPI  C/o low K, cramps C/o urinary issue F/u eye swelling w/blurred vision, rash x 2 mo. She was seen by Dr R.  F/u allergies - worse, LBP - better, knee pain, occ cough Nl BP at home  BP Readings from Last 3 Encounters:  05/13/14 124/76  04/01/14 118/78  02/28/14 121/77   Wt Readings from Last 3 Encounters:  05/13/14 184 lb (83.462 kg)  04/01/14 182 lb (82.555 kg)  02/28/14 179 lb (81.194 kg)      Review of Systems  Constitutional: Positive for fatigue. Negative for chills, activity change, appetite change and unexpected weight change.  HENT: Positive for congestion and rhinorrhea. Negative for ear discharge, mouth sores and sinus pressure.   Eyes: Positive for pain, discharge, redness and visual disturbance.  Respiratory: Positive for cough. Negative for chest tightness.   Gastrointestinal:       GERD  Genitourinary: Negative for difficulty urinating and vaginal pain.  Musculoskeletal: Negative for gait problem.  Skin: Negative for pallor and rash.  Neurological: Negative for dizziness and tremors.  Psychiatric/Behavioral: Positive for dysphoric mood. Negative for suicidal ideas, confusion and sleep disturbance. The patient is not nervous/anxious.        Objective:   Physical Exam  Constitutional: She appears well-developed. No distress.  HENT:  Head: Normocephalic.  Right Ear: External ear normal.  Left Ear: External ear normal.  Nose: Nose normal.  Mouth/Throat: Oropharynx is clear and moist.  Eyes: Conjunctivae are normal. Pupils are equal, round, and reactive to light. Right eye exhibits no discharge. Left eye exhibits no discharge.  Neck: Normal range of motion. Neck supple. No JVD present. No tracheal deviation present. No thyromegaly present.  Cardiovascular: Normal rate, regular rhythm and normal heart sounds.   Pulmonary/Chest: No stridor. No respiratory distress. She has no wheezes.  Abdominal: Soft. Bowel sounds are normal. She  exhibits no distension and no mass. There is no tenderness. There is no rebound and no guarding.  Musculoskeletal: She exhibits no edema or tenderness.  Lymphadenopathy:    She has no cervical adenopathy.  Neurological: She displays normal reflexes. No cranial nerve deficit. She exhibits normal muscle tone. Coordination normal.  Skin: No rash noted. No erythema.  Psychiatric: She has a normal mood and affect. Her behavior is normal. Judgment and thought content normal.  LS tender Eyes w/tears   Lab Results  Component Value Date   WBC 3.6* 01/31/2013   HGB 10.9* 01/31/2013   HCT 32.3* 01/31/2013   PLT 185.0 01/31/2013   GLUCOSE 74 03/27/2014   CHOL 113 01/31/2013   TRIG 89.0 01/31/2013   HDL 34.80* 01/31/2013   LDLDIRECT 185.7 01/19/2012   LDLCALC 60 01/31/2013   ALT 23 05/30/2013   AST 20 05/30/2013   NA 140 03/27/2014   K 3.0* 03/27/2014   CL 102 03/27/2014   CREATININE 0.76 03/27/2014   BUN 11 03/27/2014   CO2 33* 03/27/2014   TSH 0.40 03/27/2014   INR 0.9 01/10/2007   HGBA1C 6.0 08/26/2008        Assessment & Plan:

## 2014-05-13 NOTE — Progress Notes (Signed)
Pre visit review using our clinic review tool, if applicable. No additional management support is needed unless otherwise documented below in the visit note. 

## 2014-05-13 NOTE — Assessment & Plan Note (Signed)
Norco prn  Potential benefits of a long term opioids use as well as potential risks (i.e. addiction risk, apnea etc) and complications (i.e. Somnolence, constipation and others) were explained to the patient and were aknowledged. 

## 2014-05-13 NOTE — Assessment & Plan Note (Signed)
On KCl Labs 

## 2014-05-13 NOTE — Assessment & Plan Note (Signed)
Labs

## 2014-05-23 ENCOUNTER — Ambulatory Visit (INDEPENDENT_AMBULATORY_CARE_PROVIDER_SITE_OTHER): Payer: 59

## 2014-05-23 DIAGNOSIS — J309 Allergic rhinitis, unspecified: Secondary | ICD-10-CM | POA: Diagnosis not present

## 2014-05-30 ENCOUNTER — Ambulatory Visit (INDEPENDENT_AMBULATORY_CARE_PROVIDER_SITE_OTHER): Payer: 59

## 2014-05-30 DIAGNOSIS — J309 Allergic rhinitis, unspecified: Secondary | ICD-10-CM | POA: Diagnosis not present

## 2014-05-31 ENCOUNTER — Telehealth: Payer: Self-pay | Admitting: Internal Medicine

## 2014-05-31 DIAGNOSIS — E0500 Thyrotoxicosis with diffuse goiter without thyrotoxic crisis or storm: Secondary | ICD-10-CM

## 2014-05-31 DIAGNOSIS — H5000 Unspecified esotropia: Secondary | ICD-10-CM

## 2014-05-31 NOTE — Telephone Encounter (Signed)
Ok Thx 

## 2014-05-31 NOTE — Telephone Encounter (Signed)
Ashley Larsen needs an order for the patient for an MRI of the orbids with contrast. T2 weighted.

## 2014-06-03 NOTE — Telephone Encounter (Signed)
MRI order placed.

## 2014-06-03 NOTE — Telephone Encounter (Signed)
Sonia Baller from [place] called regarding order for MRI. She is faxing Dr. Leonette Most office note. Can someone enter the order? Thank you.

## 2014-06-04 ENCOUNTER — Other Ambulatory Visit: Payer: Self-pay | Admitting: *Deleted

## 2014-06-04 DIAGNOSIS — H5000 Unspecified esotropia: Secondary | ICD-10-CM

## 2014-06-04 DIAGNOSIS — E0500 Thyrotoxicosis with diffuse goiter without thyrotoxic crisis or storm: Secondary | ICD-10-CM

## 2014-06-05 ENCOUNTER — Telehealth: Payer: Self-pay | Admitting: Family

## 2014-06-05 DIAGNOSIS — J019 Acute sinusitis, unspecified: Secondary | ICD-10-CM

## 2014-06-05 MED ORDER — AMOXICILLIN-POT CLAVULANATE 875-125 MG PO TABS: 1.0000 | ORAL_TABLET | Freq: Two times a day (BID) | ORAL | Status: DC

## 2014-06-05 NOTE — Progress Notes (Signed)

## 2014-06-06 ENCOUNTER — Ambulatory Visit: Payer: 59

## 2014-06-12 ENCOUNTER — Ambulatory Visit (INDEPENDENT_AMBULATORY_CARE_PROVIDER_SITE_OTHER): Payer: 59

## 2014-06-12 DIAGNOSIS — J309 Allergic rhinitis, unspecified: Secondary | ICD-10-CM | POA: Diagnosis not present

## 2014-06-19 ENCOUNTER — Ambulatory Visit (INDEPENDENT_AMBULATORY_CARE_PROVIDER_SITE_OTHER): Payer: 59

## 2014-06-19 ENCOUNTER — Ambulatory Visit
Admission: RE | Admit: 2014-06-19 | Discharge: 2014-06-19 | Disposition: A | Discharge status: Home / Self Care | Payer: 59 | Source: Ambulatory Visit | Attending: Internal Medicine | Admitting: Internal Medicine

## 2014-06-19 DIAGNOSIS — E0500 Thyrotoxicosis with diffuse goiter without thyrotoxic crisis or storm: Secondary | ICD-10-CM

## 2014-06-19 DIAGNOSIS — J309 Allergic rhinitis, unspecified: Secondary | ICD-10-CM | POA: Diagnosis not present

## 2014-06-19 DIAGNOSIS — H5000 Unspecified esotropia: Secondary | ICD-10-CM

## 2014-06-19 MED ORDER — GADOBENATE DIMEGLUMINE 529 MG/ML IV SOLN
17.0000 mL | Freq: Once | INTRAVENOUS | Status: AC | PRN
  Administered 2014-06-19: 17 mL via INTRAVENOUS

## 2014-06-20 ENCOUNTER — Encounter: Payer: Self-pay | Admitting: Internal Medicine

## 2014-06-20 MED ORDER — FLUCONAZOLE 150 MG PO TABS: 150.0000 mg | ORAL_TABLET | Freq: Once | ORAL | Status: DC

## 2014-06-24 ENCOUNTER — Telehealth: Payer: Self-pay | Admitting: Internal Medicine

## 2014-06-24 NOTE — Telephone Encounter (Signed)
Error

## 2014-06-25 ENCOUNTER — Telehealth: Payer: Self-pay | Admitting: Internal Medicine

## 2014-06-25 NOTE — Telephone Encounter (Signed)
Patient is calling to inquire on MRI results. States that she has been waiting since 06/19/2014.

## 2014-06-25 NOTE — Telephone Encounter (Signed)
Please advise in PCP's absence. Thanks! 

## 2014-06-25 NOTE — Telephone Encounter (Signed)
Changes are muscle enlargement caused by prior overactive thyroid disease and small blood vessel changes seen with HBP and smoking . Dr Mignon Pine has made referral to Dr Loanne Drilling and may want Ophthalmology consult also. BP goal = average < 140/90.

## 2014-06-26 ENCOUNTER — Encounter: Payer: Self-pay | Admitting: Internal Medicine

## 2014-06-27 ENCOUNTER — Ambulatory Visit (INDEPENDENT_AMBULATORY_CARE_PROVIDER_SITE_OTHER): Payer: 59

## 2014-06-27 DIAGNOSIS — J309 Allergic rhinitis, unspecified: Secondary | ICD-10-CM | POA: Diagnosis not present

## 2014-06-27 NOTE — Telephone Encounter (Signed)
Pt informed

## 2014-07-04 ENCOUNTER — Ambulatory Visit (INDEPENDENT_AMBULATORY_CARE_PROVIDER_SITE_OTHER): Payer: 59

## 2014-07-04 DIAGNOSIS — J309 Allergic rhinitis, unspecified: Secondary | ICD-10-CM

## 2014-07-11 ENCOUNTER — Ambulatory Visit (INDEPENDENT_AMBULATORY_CARE_PROVIDER_SITE_OTHER): Payer: 59

## 2014-07-11 DIAGNOSIS — J309 Allergic rhinitis, unspecified: Secondary | ICD-10-CM | POA: Diagnosis not present

## 2014-07-17 ENCOUNTER — Ambulatory Visit: Payer: 59

## 2014-07-19 ENCOUNTER — Ambulatory Visit (INDEPENDENT_AMBULATORY_CARE_PROVIDER_SITE_OTHER): Payer: 59

## 2014-07-19 DIAGNOSIS — J309 Allergic rhinitis, unspecified: Secondary | ICD-10-CM | POA: Diagnosis not present

## 2014-07-24 ENCOUNTER — Other Ambulatory Visit (INDEPENDENT_AMBULATORY_CARE_PROVIDER_SITE_OTHER): Payer: 59

## 2014-07-24 DIAGNOSIS — E876 Hypokalemia: Secondary | ICD-10-CM

## 2014-07-24 DIAGNOSIS — E890 Postprocedural hypothyroidism: Secondary | ICD-10-CM | POA: Diagnosis not present

## 2014-07-24 LAB — T4, FREE: Free T4: 0.97 ng/dL (ref 0.60–1.60)

## 2014-07-24 LAB — BASIC METABOLIC PANEL WITH GFR
BUN: 17 mg/dL (ref 6–23)
CO2: 33 meq/L — ABNORMAL HIGH (ref 19–32)
Calcium: 9.5 mg/dL (ref 8.4–10.5)
Chloride: 100 meq/L (ref 96–112)
Creatinine, Ser: 0.88 mg/dL (ref 0.40–1.20)
GFR: 83.74 mL/min (ref >60.00)
Glucose, Bld: 92 mg/dL (ref 70–99)
Potassium: 3 meq/L — ABNORMAL LOW (ref 3.5–5.1)
Sodium: 138 meq/L (ref 135–145)

## 2014-07-24 LAB — TSH: TSH: 0.89 u[IU]/mL (ref 0.35–4.50)

## 2014-07-25 ENCOUNTER — Telehealth: Payer: Self-pay | Admitting: Internal Medicine

## 2014-07-25 ENCOUNTER — Ambulatory Visit (INDEPENDENT_AMBULATORY_CARE_PROVIDER_SITE_OTHER): Payer: 59

## 2014-07-25 ENCOUNTER — Encounter: Payer: Self-pay | Admitting: Internal Medicine

## 2014-07-25 DIAGNOSIS — J309 Allergic rhinitis, unspecified: Secondary | ICD-10-CM | POA: Diagnosis not present

## 2014-07-25 NOTE — Telephone Encounter (Signed)
Date Mixed: 07/25/2014 Vial: AB Strength: 1:10 Here/Mail/Pick Up: Here Mixed By: Desmond Dike, CMA

## 2014-07-26 ENCOUNTER — Other Ambulatory Visit: Payer: 59

## 2014-07-26 ENCOUNTER — Ambulatory Visit: Payer: 59

## 2014-07-30 DIAGNOSIS — H2513 Age-related nuclear cataract, bilateral: Secondary | ICD-10-CM | POA: Insufficient documentation

## 2014-07-30 DIAGNOSIS — H18413 Arcus senilis, bilateral: Secondary | ICD-10-CM | POA: Insufficient documentation

## 2014-07-30 DIAGNOSIS — H269 Unspecified cataract: Secondary | ICD-10-CM | POA: Insufficient documentation

## 2014-07-30 DIAGNOSIS — H11823 Conjunctivochalasis, bilateral: Secondary | ICD-10-CM | POA: Insufficient documentation

## 2014-07-31 ENCOUNTER — Encounter: Payer: Self-pay | Admitting: Endocrinology

## 2014-07-31 ENCOUNTER — Ambulatory Visit (INDEPENDENT_AMBULATORY_CARE_PROVIDER_SITE_OTHER): Payer: 59 | Admitting: Endocrinology

## 2014-07-31 VITALS — BP 130/80 | HR 60 | Temp 98.2°F | Resp 16 | Wt 182.0 lb

## 2014-07-31 DIAGNOSIS — E876 Hypokalemia: Secondary | ICD-10-CM

## 2014-07-31 DIAGNOSIS — E890 Postprocedural hypothyroidism: Secondary | ICD-10-CM

## 2014-07-31 MED ORDER — LEVOTHYROXINE SODIUM 75 MCG PO TABS: 75.0000 ug | ORAL_TABLET | Freq: Every day | ORAL | Status: DC

## 2014-07-31 NOTE — Progress Notes (Signed)
Patient ID: Ashley Larsen, female   DOB: [birth year], 64 y.o.   MRN: [id number]   Reason for Appointment:  Post ablative hypothyroidism, followup   History of Present Illness:   She had hyperthyroidism first diagnosed in 01/2013 and treated with radioactive iodine on 05/03/13 with 12 mCi She became hypothyroid subsequently and in 6/15 with a free T4 level of 0.57 she was started on 112 mcg Synthroid  Recent history: Her dose was reduced to 88 mcg in 07/2013 because of relatively high free T4 of 1.4 and low TSH  Since her TSH was still relatively low in 12/15 she was told to take only 6-1/2 tablets a week, with TSH still 0.4 she is now taking 6 tablets a week This has improved her TSH level now and free T4 is normal  She does not complain of unusual fatigue or cold intolerance  She has been regular with her Synthroid dose before breakfast   Wt Readings from Last 3 Encounters:  07/31/14 182 lb (82.555 kg)  05/13/14 184 lb (83.462 kg)  04/01/14 182 lb (82.555 kg)   Labs:   Lab Results  Component Value Date   TSH 0.89 07/24/2014   TSH 0.40 03/27/2014   TSH 0.30* 01/22/2014   FREET4 0.97 07/24/2014   FREET4 1.24 03/27/2014   FREET4 1.16 01/22/2014        OPHTHALMOPATHY:  She is not complaining as much of her left eye having excessive tearing She was told to use artificial tears  She has been using the prism lens to control diplopia  She she has been seen by neuro ophthalmologist Continues on Maxzide  She is keeping  the head of her bed  elevated on 2 pillows     Medication List       This list is accurate as of: 07/31/14 11:47 AM.  Always use your most recent med list.               amoxicillin-clavulanate 875-125 MG per tablet  Commonly known as:  AUGMENTIN  Take 1 tablet by mouth 2 (two) times daily.     cholecalciferol 1000 UNITS tablet  Commonly known as:   VITAMIN D  Take 1 tablet (1,000 Units total) by mouth daily.     fexofenadine 180 MG tablet  Commonly known as:  ALLEGRA  Take 1 tablet (180 mg total) by mouth daily as needed for allergies.     fluconazole 150 MG tablet  Commonly known as:  DIFLUCAN  Take 1 tablet (150 mg total) by mouth once.     HYDROcodone-acetaminophen 5-325 MG per tablet  Commonly known as:  NORCO/VICODIN  Take 1 tablet by mouth every 8 (eight) hours as needed for severe pain.     levothyroxine 88 MCG tablet  Commonly known as:  SYNTHROID, LEVOTHROID  Take 1 tablet (88 mcg total) by mouth daily.     potassium chloride SA 20 MEQ tablet  Commonly known as:  K-DUR,KLOR-CON  Take 1 tablet (  20 mEq total) by mouth daily.     promethazine 25 MG tablet  Commonly known as:  PHENERGAN  Take 1 tablet (25 mg total) by mouth every 8 (eight) hours as needed for nausea or vomiting.     Selenium 100 MCG Tabs  1 po bid for your eye condition     triamcinolone cream 0.1 %  Commonly known as:  KENALOG  Apply 1 application topically 3 (three) times daily.     triamterene-hydrochlorothiazide 37.5-25 MG per tablet  Commonly known as:  MAXZIDE-25  Take 1 tablet by mouth daily.            Past Medical History  Diagnosis Date  . Asthma   . Allergic rhinitis   . Insomnia   . Paresthesia   . GERD (gastroesophageal reflux disease)   . Diverticulosis   . Ovarian cyst   . Alopecia   . IBS (irritable bowel syndrome)   . Hypertension   . COPD (chronic obstructive pulmonary disease)   . Hyperthyroidism   . Cataract     bilateral cateracts    Past Surgical History  Procedure Laterality Date  . Tubal ligation    . Knee arthroscopy      Left  . Oophorectomy      Left  . Rotator cuff repair    . Colonoscopy      Family History  Problem Relation Age of Onset  . Hypertension    . Cancer Sister 62    colon  . Diabetes Sister   . Cancer Brother     brother - stomach ca  . Stomach cancer Brother   . Colon  polyps Sister     and Brother  . Cancer Sister 23     colon  . Kidney disease Brother   . Diabetes Brother   . Diabetes Brother   . Heart disease Father   . Other Mother     sclerdermia  . Diabetes Paternal Grandfather   . Colon cancer Sister   . Esophageal cancer Neg Hx   . Rectal cancer Neg Hx     Social History:  reports that she has been smoking Cigarettes.  She has a 5 pack-year smoking history. She has never used smokeless tobacco. She reports that she does not drink alcohol or use illicit drugs.  Allergies:  Allergies  Allergen Reactions  . Metronidazole     REACTION: upset stomach  . Oxycodone-Acetaminophen     Review of Systems:   HYPERTENSION: Has history of high blood pressure  which is mild    History of hypokalemia on HCTZ which was stopped and she is now taking Maxzide, potassium is again low because she ran out of her potassium and has not taken it for 3 weeks, started back last week after her labs   Examination:   BP 130/80 mmHg  Pulse 60  Temp(Src) 98.2 F (36.8 C) (Oral)  Resp 16  Wt 182 lb (82.555 kg)  SpO2 98%    Eyes:  mild eyelid swelling present along with mild proptosis  She has no diplopia on looking to the left but still has decreased lateral movement of the left eye.    She has mild divergence of the eyes  No conjunctival erythema Neurological: REFLEXES: at biceps are normal  No ankle edema    Assessment/Plan:   Post-ablative hypothyroidism  She has post ablative hypothyroidism and thyroid levels are  normal with using 88 mcg, 6 tablets a week  Subjectively she  is feeling fairly good and looks euthyroid; is slowly gaining weight  Have recommended that she switch to 75 g daily for convenience  Will need to follow-up again in 6 months   Graves ophthalmopathy:   Now followed by ophthalmologist  Also she will continue Maxide 25 mg since this appears to help her eyelid edema And blood pressure at the same time Not clear if she  is benefiting from selenium   Hypertension: Well controlled with Maxzide but may consider switching to Aldactone if hypokalemia continues to be a problem, will defer to PCP  HYPOKALEMIA: She will need to continue potassium supplements as the level is 3.0, she will follow-up with PCP later this month  Followup in 2 months   Reneta Niehaus 07/31/2014, 11:47 AM

## 2014-07-31 NOTE — Patient Instructions (Signed)
Take potassium daily

## 2014-08-02 ENCOUNTER — Ambulatory Visit (INDEPENDENT_AMBULATORY_CARE_PROVIDER_SITE_OTHER): Payer: 59

## 2014-08-02 DIAGNOSIS — J309 Allergic rhinitis, unspecified: Secondary | ICD-10-CM

## 2014-08-07 ENCOUNTER — Ambulatory Visit (INDEPENDENT_AMBULATORY_CARE_PROVIDER_SITE_OTHER): Payer: 59

## 2014-08-07 DIAGNOSIS — J309 Allergic rhinitis, unspecified: Secondary | ICD-10-CM | POA: Diagnosis not present

## 2014-08-14 ENCOUNTER — Ambulatory Visit (INDEPENDENT_AMBULATORY_CARE_PROVIDER_SITE_OTHER): Payer: 59 | Admitting: Internal Medicine

## 2014-08-14 ENCOUNTER — Encounter: Payer: Self-pay | Admitting: Internal Medicine

## 2014-08-14 ENCOUNTER — Ambulatory Visit (INDEPENDENT_AMBULATORY_CARE_PROVIDER_SITE_OTHER): Payer: 59

## 2014-08-14 VITALS — BP 128/82 | HR 82 | Wt 182.0 lb

## 2014-08-14 DIAGNOSIS — J309 Allergic rhinitis, unspecified: Secondary | ICD-10-CM | POA: Diagnosis not present

## 2014-08-14 DIAGNOSIS — E876 Hypokalemia: Secondary | ICD-10-CM | POA: Diagnosis not present

## 2014-08-14 DIAGNOSIS — E890 Postprocedural hypothyroidism: Secondary | ICD-10-CM | POA: Diagnosis not present

## 2014-08-14 DIAGNOSIS — M5441 Lumbago with sciatica, right side: Secondary | ICD-10-CM | POA: Diagnosis not present

## 2014-08-14 DIAGNOSIS — I10 Essential (primary) hypertension: Secondary | ICD-10-CM | POA: Diagnosis not present

## 2014-08-14 DIAGNOSIS — M5442 Lumbago with sciatica, left side: Secondary | ICD-10-CM

## 2014-08-14 MED ORDER — LORATADINE 10 MG PO TABS: 10.0000 mg | ORAL_TABLET | Freq: Every day | ORAL | Status: DC

## 2014-08-14 MED ORDER — CEFUROXIME AXETIL 250 MG PO TABS: 250.0000 mg | ORAL_TABLET | Freq: Two times a day (BID) | ORAL | Status: DC

## 2014-08-14 MED ORDER — HYDROCODONE-ACETAMINOPHEN 5-325 MG PO TABS: 1.0000 | ORAL_TABLET | Freq: Three times a day (TID) | ORAL | Status: DC | PRN

## 2014-08-14 MED ORDER — LOSARTAN POTASSIUM 100 MG PO TABS: 100.0000 mg | ORAL_TABLET | Freq: Every day | ORAL | Status: DC

## 2014-08-14 MED ORDER — FLUCONAZOLE 150 MG PO TABS: 150.0000 mg | ORAL_TABLET | Freq: Once | ORAL | Status: DC

## 2014-08-14 NOTE — Assessment & Plan Note (Signed)
Norco prn  Potential benefits of a long term opioids use as well as potential risks (i.e. addiction risk, apnea etc) and complications (i.e. Somnolence, constipation and others) were explained to the patient and were aknowledged. 

## 2014-08-14 NOTE — Assessment & Plan Note (Signed)
D/c maxzide due to low K Start Losartan Finish KCl

## 2014-08-14 NOTE — Progress Notes (Signed)
Pre visit review using our clinic review tool, if applicable. No additional management support is needed unless otherwise documented below in the visit note. 

## 2014-08-14 NOTE — Assessment & Plan Note (Signed)
On Levothroid 

## 2014-08-14 NOTE — Assessment & Plan Note (Signed)
D/c maxzide due to low K Start Losartan

## 2014-08-14 NOTE — Progress Notes (Signed)
   Subjective:    HPI  C/o low K, cramps C/o urinary issue F/u eye swelling w/blurred vision, rash x 2 mo. She was seen by Dr R.  F/u allergies - worse, LBP - better, knee pain, occ cough Nl BP at home  BP Readings from Last 3 Encounters:  08/14/14 128/82  07/31/14 130/80  05/13/14 124/76   Wt Readings from Last 3 Encounters:  08/14/14 182 lb (82.555 kg)  07/31/14 182 lb (82.555 kg)  05/13/14 184 lb (83.462 kg)      Review of Systems  Constitutional: Positive for fatigue. Negative for chills, activity change, appetite change and unexpected weight change.  HENT: Positive for congestion and rhinorrhea. Negative for ear discharge, mouth sores and sinus pressure.   Eyes: Positive for pain, discharge, redness and visual disturbance.  Respiratory: Positive for cough. Negative for chest tightness.   Gastrointestinal:       GERD  Genitourinary: Negative for difficulty urinating and vaginal pain.  Musculoskeletal: Negative for gait problem.  Skin: Negative for pallor and rash.  Neurological: Negative for dizziness and tremors.  Psychiatric/Behavioral: Positive for dysphoric mood. Negative for suicidal ideas, confusion and sleep disturbance. The patient is not nervous/anxious.        Objective:   Physical Exam  Constitutional: She appears well-developed. No distress.  HENT:  Head: Normocephalic.  Right Ear: External ear normal.  Left Ear: External ear normal.  Nose: Nose normal.  Mouth/Throat: Oropharynx is clear and moist.  Eyes: Conjunctivae are normal. Pupils are equal, round, and reactive to light. Right eye exhibits no discharge. Left eye exhibits no discharge.  Neck: Normal range of motion. Neck supple. No JVD present. No tracheal deviation present. No thyromegaly present.  Cardiovascular: Normal rate, regular rhythm and normal heart sounds.   Pulmonary/Chest: No stridor. No respiratory distress. She has no wheezes.  Abdominal: Soft. Bowel sounds are normal. She  exhibits no distension and no mass. There is no tenderness. There is no rebound and no guarding.  Musculoskeletal: She exhibits no edema or tenderness.  Lymphadenopathy:    She has no cervical adenopathy.  Neurological: She displays normal reflexes. No cranial nerve deficit. She exhibits normal muscle tone. Coordination normal.  Skin: No rash noted. No erythema.  Psychiatric: She has a normal mood and affect. Her behavior is normal. Judgment and thought content normal.  LS tender Eyes w/tears   Lab Results  Component Value Date   WBC 3.6* 01/31/2013   HGB 10.9* 01/31/2013   HCT 32.3* 01/31/2013   PLT 185.0 01/31/2013   GLUCOSE 92 07/24/2014   CHOL 113 01/31/2013   TRIG 89.0 01/31/2013   HDL 34.80* 01/31/2013   LDLDIRECT 185.7 01/19/2012   LDLCALC 60 01/31/2013   ALT 23 05/30/2013   AST 20 05/30/2013   NA 138 07/24/2014   K 3.0* 07/24/2014   CL 100 07/24/2014   CREATININE 0.88 07/24/2014   BUN 17 07/24/2014   CO2 33* 07/24/2014   TSH 0.89 07/24/2014   INR 0.9 01/10/2007   HGBA1C 6.0 08/26/2008        Assessment & Plan:

## 2014-08-21 ENCOUNTER — Ambulatory Visit (INDEPENDENT_AMBULATORY_CARE_PROVIDER_SITE_OTHER): Payer: 59

## 2014-08-21 DIAGNOSIS — J309 Allergic rhinitis, unspecified: Secondary | ICD-10-CM | POA: Diagnosis not present

## 2014-08-29 ENCOUNTER — Ambulatory Visit (INDEPENDENT_AMBULATORY_CARE_PROVIDER_SITE_OTHER): Payer: 59

## 2014-08-29 DIAGNOSIS — J309 Allergic rhinitis, unspecified: Secondary | ICD-10-CM

## 2014-08-29 NOTE — Telephone Encounter (Signed)
Date Mixed: 07/25/14 Vial: 2 Strength: 1:10 Here/Mail/Pick Up: here Mixed By: tbs

## 2014-09-05 ENCOUNTER — Ambulatory Visit (INDEPENDENT_AMBULATORY_CARE_PROVIDER_SITE_OTHER): Payer: 59

## 2014-09-05 DIAGNOSIS — J309 Allergic rhinitis, unspecified: Secondary | ICD-10-CM | POA: Diagnosis not present

## 2014-09-12 ENCOUNTER — Ambulatory Visit: Payer: 59

## 2014-09-26 ENCOUNTER — Ambulatory Visit (INDEPENDENT_AMBULATORY_CARE_PROVIDER_SITE_OTHER): Payer: 59

## 2014-09-26 DIAGNOSIS — J309 Allergic rhinitis, unspecified: Secondary | ICD-10-CM

## 2014-10-03 ENCOUNTER — Ambulatory Visit: Payer: 59

## 2014-10-11 ENCOUNTER — Encounter: Payer: Self-pay | Admitting: Internal Medicine

## 2014-10-11 ENCOUNTER — Ambulatory Visit (INDEPENDENT_AMBULATORY_CARE_PROVIDER_SITE_OTHER): Payer: 59

## 2014-10-11 DIAGNOSIS — J309 Allergic rhinitis, unspecified: Secondary | ICD-10-CM

## 2014-10-14 ENCOUNTER — Ambulatory Visit: Payer: 59

## 2014-10-17 ENCOUNTER — Ambulatory Visit (INDEPENDENT_AMBULATORY_CARE_PROVIDER_SITE_OTHER): Payer: 59

## 2014-10-17 DIAGNOSIS — J309 Allergic rhinitis, unspecified: Secondary | ICD-10-CM | POA: Diagnosis not present

## 2014-10-24 ENCOUNTER — Ambulatory Visit (INDEPENDENT_AMBULATORY_CARE_PROVIDER_SITE_OTHER): Payer: 59

## 2014-10-24 DIAGNOSIS — J309 Allergic rhinitis, unspecified: Secondary | ICD-10-CM

## 2014-10-31 ENCOUNTER — Ambulatory Visit: Payer: 59

## 2014-11-07 ENCOUNTER — Ambulatory Visit (INDEPENDENT_AMBULATORY_CARE_PROVIDER_SITE_OTHER): Payer: 59

## 2014-11-07 DIAGNOSIS — J309 Allergic rhinitis, unspecified: Secondary | ICD-10-CM | POA: Diagnosis not present

## 2014-11-14 ENCOUNTER — Ambulatory Visit (INDEPENDENT_AMBULATORY_CARE_PROVIDER_SITE_OTHER): Payer: 59

## 2014-11-14 DIAGNOSIS — J309 Allergic rhinitis, unspecified: Secondary | ICD-10-CM

## 2014-11-21 ENCOUNTER — Ambulatory Visit (INDEPENDENT_AMBULATORY_CARE_PROVIDER_SITE_OTHER): Payer: 59

## 2014-11-21 DIAGNOSIS — J309 Allergic rhinitis, unspecified: Secondary | ICD-10-CM | POA: Diagnosis not present

## 2014-11-29 ENCOUNTER — Ambulatory Visit: Payer: 59

## 2014-12-10 ENCOUNTER — Ambulatory Visit (INDEPENDENT_AMBULATORY_CARE_PROVIDER_SITE_OTHER): Payer: 59

## 2014-12-10 ENCOUNTER — Other Ambulatory Visit: Payer: Self-pay | Admitting: Internal Medicine

## 2014-12-10 DIAGNOSIS — J309 Allergic rhinitis, unspecified: Secondary | ICD-10-CM

## 2014-12-12 NOTE — Telephone Encounter (Signed)
Called pharmacy spoke with pharmacist Opal Sidles gave md approval.../lmb

## 2014-12-17 ENCOUNTER — Ambulatory Visit (INDEPENDENT_AMBULATORY_CARE_PROVIDER_SITE_OTHER): Payer: 59

## 2014-12-17 DIAGNOSIS — Z23 Encounter for immunization: Secondary | ICD-10-CM

## 2014-12-17 IMAGING — CR DG LUMBAR SPINE COMPLETE 4+V
5 series · 5 of 5 positions shown · non-contrast
Comparison: CT Abdomen and Pelvis [DATE].

CLINICAL DATA: 62-year-old female low back pain.

EXAM:
LUMBAR SPINE - COMPLETE 4+ VIEW

[view not recorded (1 of 5)]
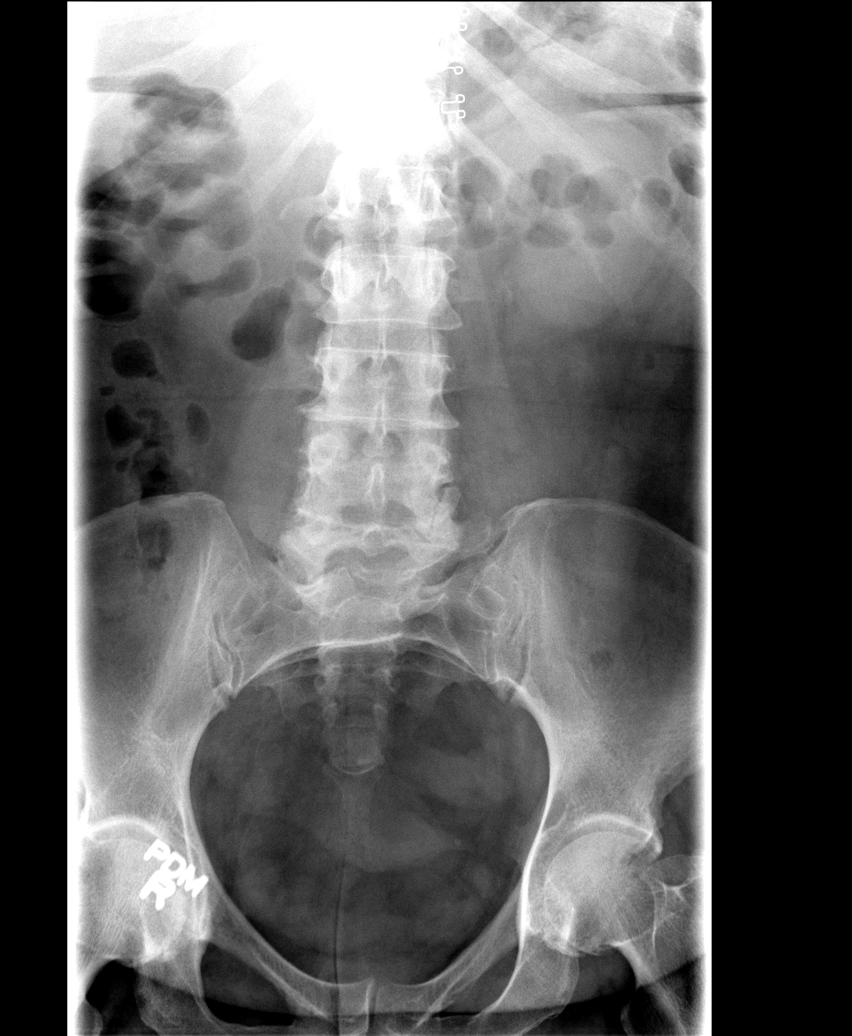

[view not recorded (2 of 5)]
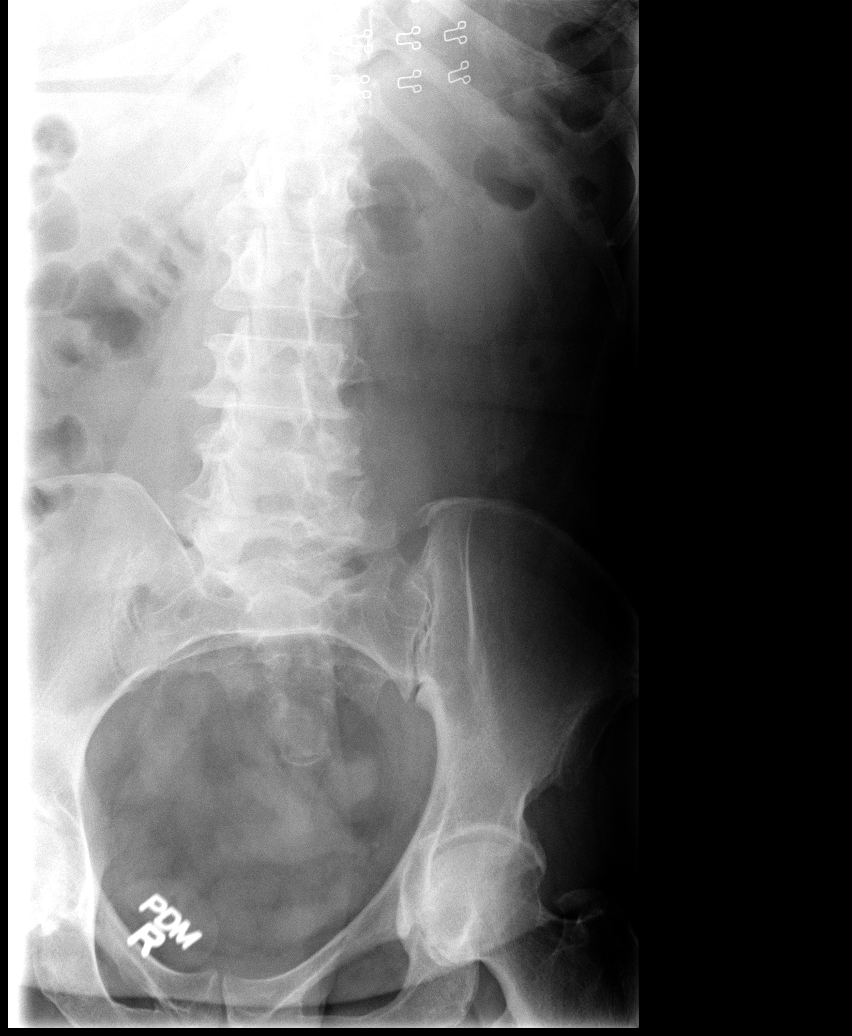

[view not recorded (3 of 5)]
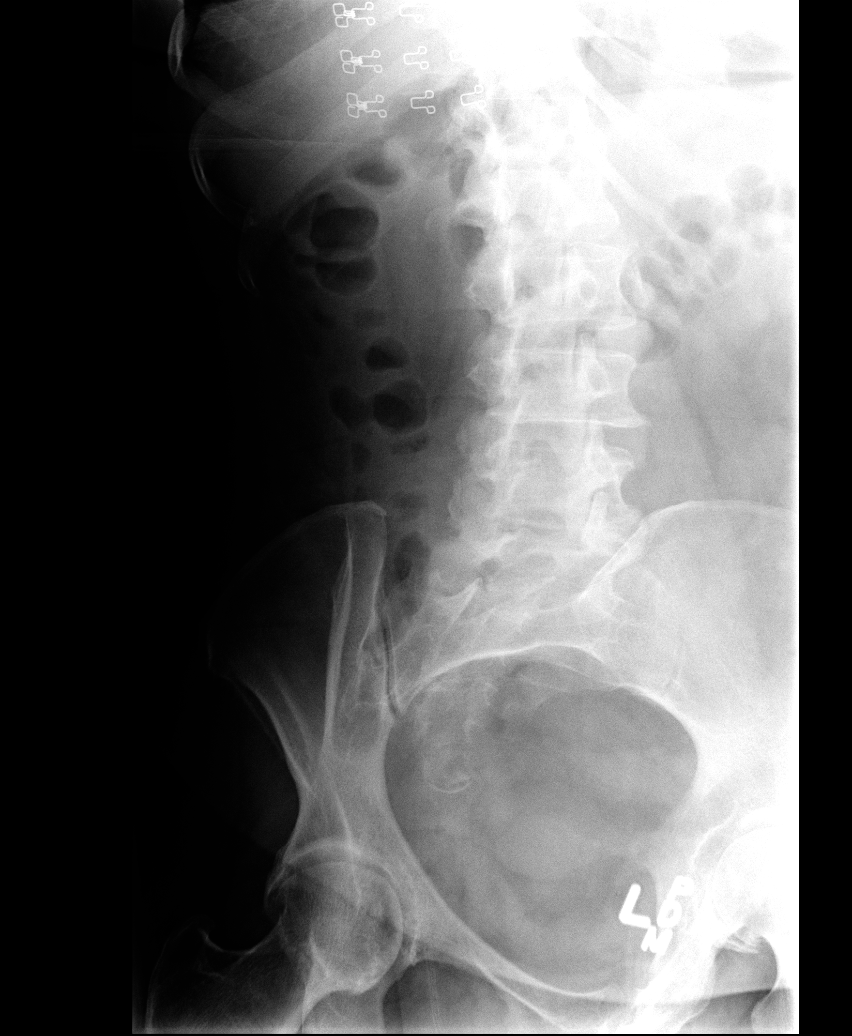

[view not recorded (4 of 5)]
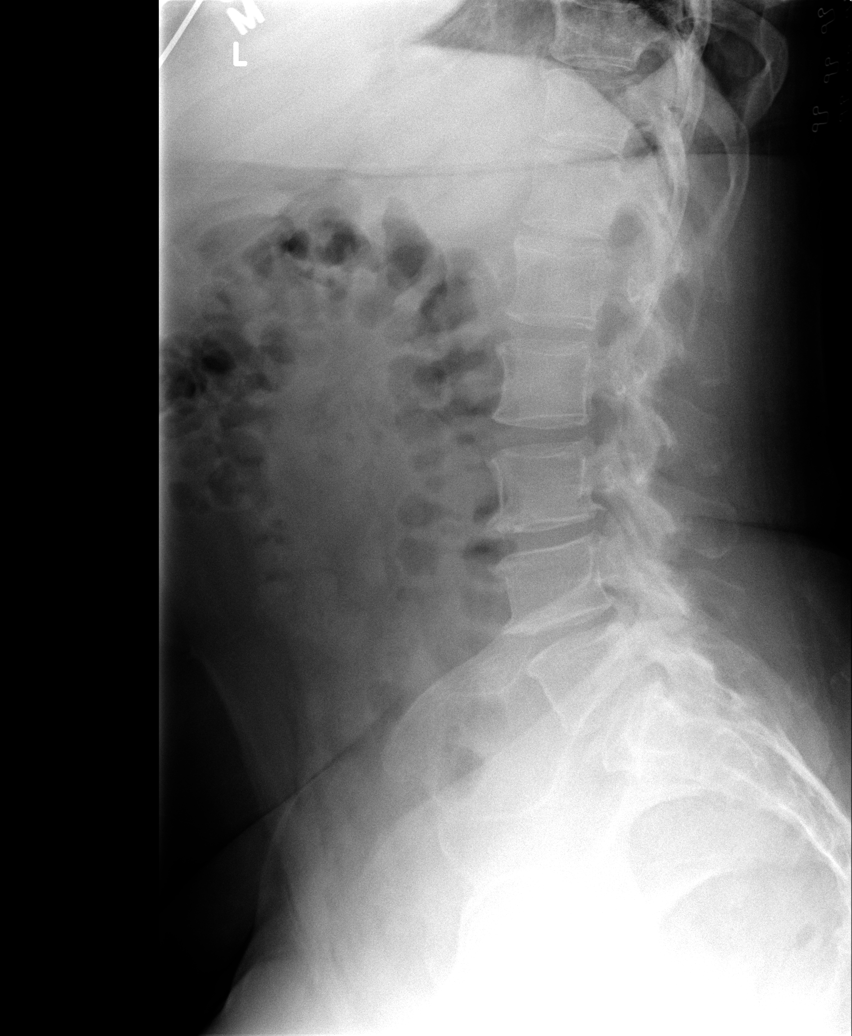

[view not recorded (5 of 5)]
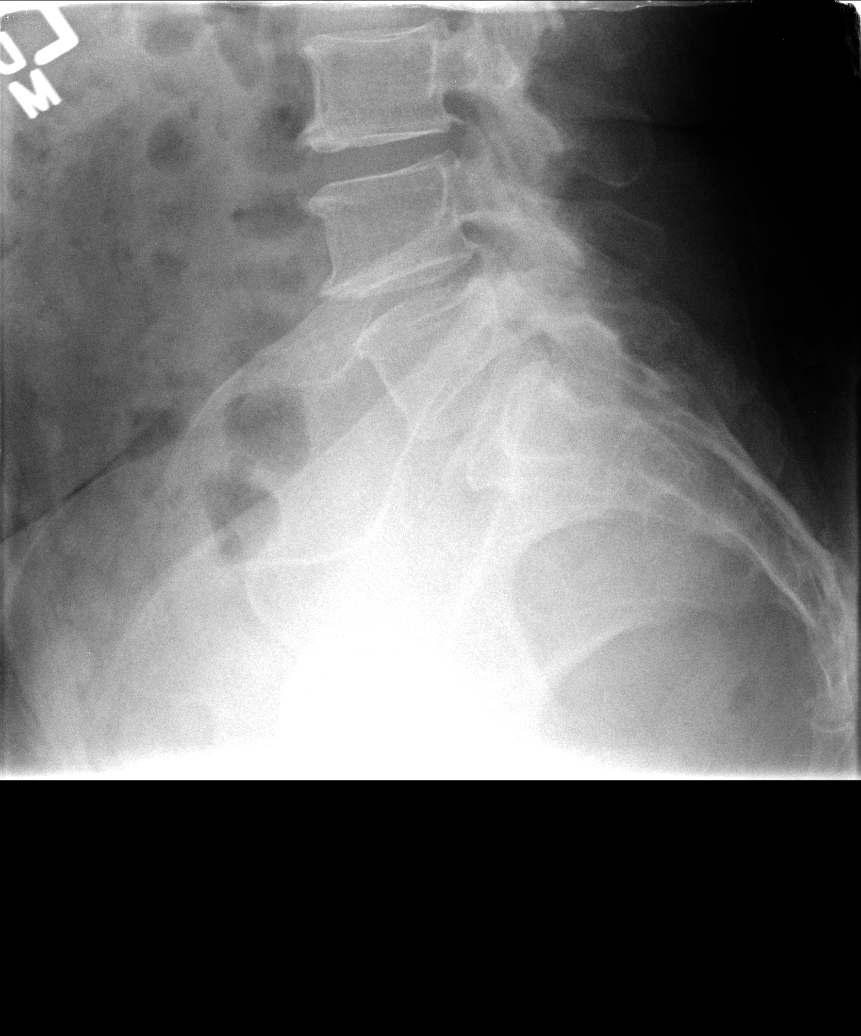

[5 of 5 positions shown; findings below may reference images not displayed]

FINDINGS: Normal lumbar segmentation. Stable and normal vertebral height and
alignment. Endplate spurring has progressed from L3 to L5. No pars
fracture. Moderate to severe chronic L4-L5 facet degeneration
greater on the left. sacral ala and SI joints within normal limits.
IMPRESSION: Chronic L4-L5 facet arthropathy greater on the left. No acute
osseous abnormality identified.

## 2014-12-24 ENCOUNTER — Ambulatory Visit: Payer: 59

## 2014-12-27 ENCOUNTER — Ambulatory Visit (INDEPENDENT_AMBULATORY_CARE_PROVIDER_SITE_OTHER): Payer: 59

## 2014-12-27 DIAGNOSIS — J309 Allergic rhinitis, unspecified: Secondary | ICD-10-CM

## 2015-01-02 ENCOUNTER — Ambulatory Visit: Payer: 59

## 2015-01-06 ENCOUNTER — Other Ambulatory Visit: Payer: Self-pay | Admitting: Internal Medicine

## 2015-01-07 ENCOUNTER — Ambulatory Visit (INDEPENDENT_AMBULATORY_CARE_PROVIDER_SITE_OTHER): Payer: 59

## 2015-01-07 ENCOUNTER — Telehealth: Payer: Self-pay | Admitting: Internal Medicine

## 2015-01-07 DIAGNOSIS — J309 Allergic rhinitis, unspecified: Secondary | ICD-10-CM | POA: Diagnosis not present

## 2015-01-07 NOTE — Telephone Encounter (Signed)
Please follow up with patient she is requesting script for hydrocodone

## 2015-01-08 ENCOUNTER — Telehealth: Payer: Self-pay | Admitting: Internal Medicine

## 2015-01-08 DIAGNOSIS — E0500 Thyrotoxicosis with diffuse goiter without thyrotoxic crisis or storm: Secondary | ICD-10-CM

## 2015-01-08 NOTE — Telephone Encounter (Signed)
OK to fill this prescription with additional refills x0 Thank you!  

## 2015-01-08 NOTE — Telephone Encounter (Signed)
Please advise 

## 2015-01-08 NOTE — Telephone Encounter (Signed)
Patient states that she would like a referral to Twana [facility] at the eye center in [facility]. 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   She states that she has an appt for 01/15/2015

## 2015-01-10 NOTE — Telephone Encounter (Signed)
Done. Thx.

## 2015-01-13 MED ORDER — HYDROCODONE-ACETAMINOPHEN 5-325 MG PO TABS: 1.0000 | ORAL_TABLET | Freq: Three times a day (TID) | ORAL | Status: DC | PRN

## 2015-01-13 NOTE — Telephone Encounter (Signed)
Patient is following up on refill request  Please follow up

## 2015-01-13 NOTE — Telephone Encounter (Signed)
rx signed or pt contacted?  Please advise

## 2015-01-14 ENCOUNTER — Ambulatory Visit (INDEPENDENT_AMBULATORY_CARE_PROVIDER_SITE_OTHER): Payer: 59

## 2015-01-14 DIAGNOSIS — J309 Allergic rhinitis, unspecified: Secondary | ICD-10-CM

## 2015-01-14 MED ORDER — HYDROCODONE-ACETAMINOPHEN 5-325 MG PO TABS
1.0000 | ORAL_TABLET | Freq: Three times a day (TID) | ORAL | Status: DC | PRN
Start: 2015-01-14 — End: 2015-04-12

## 2015-01-14 NOTE — Telephone Encounter (Signed)
Notified pt rx ready for pick-up.../lmb 

## 2015-01-14 NOTE — Telephone Encounter (Signed)
Patient contacted, medication printed and up at front for pick up.

## 2015-01-15 DIAGNOSIS — H50012 Monocular esotropia, left eye: Secondary | ICD-10-CM | POA: Insufficient documentation

## 2015-01-15 DIAGNOSIS — H5022 Vertical strabismus, left eye: Secondary | ICD-10-CM | POA: Insufficient documentation

## 2015-01-16 ENCOUNTER — Telehealth: Payer: Self-pay | Admitting: Internal Medicine

## 2015-01-16 ENCOUNTER — Ambulatory Visit (INDEPENDENT_AMBULATORY_CARE_PROVIDER_SITE_OTHER): Payer: 59

## 2015-01-16 DIAGNOSIS — J309 Allergic rhinitis, unspecified: Secondary | ICD-10-CM | POA: Diagnosis not present

## 2015-01-16 NOTE — Telephone Encounter (Signed)
Allergy Serum Extract Date Mixed: 01/16/15 Vial: 2 Strength: 1:10 Here/Mail/Pick Up: here Mixed By: tbs Last OV: 01/23/14 Pending OV: n/a

## 2015-01-21 ENCOUNTER — Other Ambulatory Visit: Payer: 59

## 2015-01-21 ENCOUNTER — Ambulatory Visit (INDEPENDENT_AMBULATORY_CARE_PROVIDER_SITE_OTHER): Payer: 59

## 2015-01-21 ENCOUNTER — Encounter: Payer: Self-pay | Admitting: Internal Medicine

## 2015-01-21 ENCOUNTER — Ambulatory Visit (INDEPENDENT_AMBULATORY_CARE_PROVIDER_SITE_OTHER): Payer: 59 | Admitting: Internal Medicine

## 2015-01-21 ENCOUNTER — Telehealth: Payer: Self-pay | Admitting: Internal Medicine

## 2015-01-21 VITALS — BP 140/80 | HR 79 | Ht 66.0 in | Wt 180.0 lb

## 2015-01-21 DIAGNOSIS — E876 Hypokalemia: Secondary | ICD-10-CM | POA: Diagnosis not present

## 2015-01-21 DIAGNOSIS — Z1159 Encounter for screening for other viral diseases: Secondary | ICD-10-CM

## 2015-01-21 DIAGNOSIS — I10 Essential (primary) hypertension: Secondary | ICD-10-CM

## 2015-01-21 DIAGNOSIS — E890 Postprocedural hypothyroidism: Secondary | ICD-10-CM | POA: Diagnosis not present

## 2015-01-21 DIAGNOSIS — J309 Allergic rhinitis, unspecified: Secondary | ICD-10-CM

## 2015-01-21 NOTE — Progress Notes (Signed)
Pre visit review using our clinic review tool, if applicable. No additional management support is needed unless otherwise documented below in the visit note. 

## 2015-01-21 NOTE — Assessment & Plan Note (Signed)
On Levothroid Labs 

## 2015-01-21 NOTE — Assessment & Plan Note (Signed)
Labs  off HCTZ Add spironolactone if low K

## 2015-01-21 NOTE — Telephone Encounter (Signed)
Pt called in today and wanted to know if hep C order could be added to today blood work?

## 2015-01-21 NOTE — Progress Notes (Signed)
Subjective:  Patient ID: Ashley Larsen, female    DOB: Nov 24, 1950  Age: 64 y.o. MRN: [id number]  CC: Annual Exam   HPI Ashley Larsen presents for LBP, hypothyroidism, HTN f/u  Outpatient Prescriptions Prior to Visit  Medication Sig Dispense Refill  . fexofenadine (ALLEGRA) 180 MG tablet Take 1 tablet (180 mg total) by mouth daily as needed for allergies. 90 tablet 3  . fluconazole (DIFLUCAN) 150 MG tablet Take 1 tablet (150 mg total) by mouth once. 1 tablet 1  . HYDROcodone-acetaminophen (NORCO/VICODIN) 5-325 MG tablet Take 1 tablet by mouth every 8 (eight) hours as needed for severe pain. 100 tablet 0  . levothyroxine (SYNTHROID, LEVOTHROID) 75 MCG tablet Take 1 tablet (75 mcg total) by mouth daily. 90 tablet 3  . loratadine (CLARITIN) 10 MG tablet Take 1 tablet (10 mg total) by mouth daily. 100 tablet 3  . losartan (COZAAR) 100 MG tablet Take 1 tablet (100 mg total) by mouth daily. 30 tablet 11  . NON FORMULARY Allergies vaccines 1:10 weekly [facility]    . promethazine-codeine (PHENERGAN WITH CODEINE) 6.25-10 MG/5ML syrup take 5 milliliters by mouth every 6 hours if needed for cough 300 mL 0  . Selenium 100 MCG TABS 1 po bid for your eye condition 200 each 2  . triamcinolone cream (KENALOG) 0.1 % Apply 1 application topically 3 (three) times daily. 90 g 3  . cefUROXime (CEFTIN) 250 MG tablet Take 1 tablet (250 mg total) by mouth 2 (two) times daily. 20 tablet 0  . fluconazole (DIFLUCAN) 150 MG tablet Take 1 tablet (150 mg total) by mouth once. (Patient not taking: Reported on 01/21/2015) 1 tablet 1   No facility-administered medications prior to visit.    ROS Review of Systems  Constitutional: Negative for chills, activity change, appetite change, fatigue and unexpected weight change.  HENT: Negative for congestion, mouth sores and sinus pressure.   Eyes: Negative for visual disturbance.  Respiratory: Negative for cough and chest tightness.   Gastrointestinal: Negative for nausea and  abdominal pain.  Genitourinary: Negative for frequency, difficulty urinating and vaginal pain.  Musculoskeletal: Positive for arthralgias. Negative for back pain and gait problem.  Skin: Negative for pallor and rash.  Neurological: Negative for dizziness, tremors, weakness, numbness and headaches.  Psychiatric/Behavioral: Negative for confusion and sleep disturbance.    Objective:  BP 140/80 mmHg  Pulse 79  Ht 5\' 6"  (1.676 m)  Wt 180 lb (81.647 kg)  BMI 29.07 kg/m2  SpO2 97%  BP Readings from Last 3 Encounters:  01/21/15 140/80  08/14/14 128/82  07/31/14 130/80    Wt Readings from Last 3 Encounters:  01/21/15 180 lb (81.647 kg)  08/14/14 182 lb (82.555 kg)  07/31/14 182 lb (82.555 kg)    Physical Exam  Constitutional: She appears well-developed. No distress.  HENT:  Head: Normocephalic.  Right Ear: External ear normal.  Left Ear: External ear normal.  Nose: Nose normal.  Mouth/Throat: Oropharynx is clear and moist.  Eyes: Conjunctivae are normal. Pupils are equal, round, and reactive to light. Right eye exhibits no discharge. Left eye exhibits no discharge.  Neck: Normal range of motion. Neck supple. No JVD present. No tracheal deviation present. No thyromegaly present.  Cardiovascular: Normal rate, regular rhythm and normal heart sounds.   Pulmonary/Chest: No stridor. No respiratory distress. She has no wheezes.  Abdominal: Soft. Bowel sounds are normal. She exhibits no distension and no mass. There is no tenderness. There is no rebound and no guarding.  Musculoskeletal: She  exhibits tenderness. She exhibits no edema.  Lymphadenopathy:    She has no cervical adenopathy.  Neurological: She displays normal reflexes. No cranial nerve deficit. She exhibits normal muscle tone. Coordination normal.  Skin: No rash noted. No erythema.  Psychiatric: She has a normal mood and affect. Her behavior is normal. Judgment and thought content normal.    Lab Results  Component Value  Date   WBC 3.6* 01/31/2013   HGB 10.9* 01/31/2013   HCT 32.3* 01/31/2013   PLT 185.0 01/31/2013   GLUCOSE 92 07/24/2014   CHOL 113 01/31/2013   TRIG 89.0 01/31/2013   HDL 34.80* 01/31/2013   LDLDIRECT 185.7 01/19/2012   LDLCALC 60 01/31/2013   ALT 23 05/30/2013   AST 20 05/30/2013   NA 138 07/24/2014   K 3.0* 07/24/2014   CL 100 07/24/2014   CREATININE 0.88 07/24/2014   BUN 17 07/24/2014   CO2 33* 07/24/2014   TSH 0.89 07/24/2014   INR 0.9 01/10/2007   HGBA1C 6.0 08/26/2008    Mr Orbits Wo/w Cm  06/19/2014  ADDENDUM REPORT: 06/19/2014 16:51 ADDENDUM: Inadvertent omission of incidental intracranial finding. 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 White matter disease has progressed from 2010 head CT. Electronically Signed   By: Monte Fantasia M.D.   On: 06/19/2014 16:51   06/19/2014  CLINICAL DATA:  Graves ophthalmopathy. Blurred and double vision for 7 months EXAM: MRI OF THE ORBITS WITHOUT AND WITH CONTRAST TECHNIQUE: Multiplanar, multisequence MR imaging of the orbits was performed both before and after the administration of intravenous contrast. CONTRAST:  51mL MULTIHANCE GADOBENATE DIMEGLUMINE 529 MG/ML IV SOLN COMPARISON:  None. FINDINGS: Calvarium and upper cervical spine: No marrow signal abnormality. Orbits: Bilateral extraocular muscle enlargement which is more advanced on the left. 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 On the right, there is thickening of the inferior, medial, and superior rectus muscle bellies, with fatty infiltration most extensive in the inferior rectus. Mild edematous changes noted in the medial rectus. Left-sided extraocular muscle thickening is more extensive, involving all the extraocular muscles except for the inferior oblique. There is notable fatty infiltration of the  superior oblique, superior rectus, and inferior rectus. Mild edematous changes seen in the medial and lateral rectus. There is crowding of the bilateral orbital apex without optic nerve compression or signal abnormality. No distention/ congestion of the superior ophthalmic veins. Bilateral proptosis with otherwise unremarkable globes. No inflammatory changes in the orbital fat. Sinuses: Clear. Mastoid and middle ears are clear. 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 No evidence of hydrocephalus, hemorrhage, or major vessel occlusion. Faint increased density in the right lower medulla has no mass effect or signal abnormality, and is only seen in 1 plane postcontrast. This is likely artifactual, although a incidental capillary telangiectasia could have a similar appearance. 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 There is proptosis and orbital apex crowding but no optic nerve compression or signal abnormality. Electronically Signed: By: Monte Fantasia M.D. On: 06/19/2014 10:29    Assessment & Plan:   Georgana was seen today for annual exam.  Diagnoses and all orders for this visit:  Hypothyroidism, postablative  HYPOKALEMIA  Essential hypertension   I have discontinued Ms. Diedrich's cefUROXime. I am also having her maintain her fexofenadine, triamcinolone cream, Selenium,  levothyroxine, losartan, loratadine, fluconazole, NON FORMULARY, promethazine-codeine, HYDROcodone-acetaminophen, and Polyethyl Glycol-Propyl Glycol (SYSTANE OP).  Meds ordered this encounter  Medications  . Polyethyl Glycol-Propyl Glycol (SYSTANE OP)    Sig: Apply to eye daily.     Follow-up: Return in about 4 months (around 05/22/2015) for a follow-up visit.  Walker Kehr, MD

## 2015-01-22 NOTE — Telephone Encounter (Signed)
Ok - pls add Hep C antibody Thx

## 2015-01-22 NOTE — Telephone Encounter (Signed)
Called  Pt no answer LMOM MD ok order for Hep C there are no additional labs to be done by md. Place order for Hep C.../lmb

## 2015-01-27 ENCOUNTER — Other Ambulatory Visit (INDEPENDENT_AMBULATORY_CARE_PROVIDER_SITE_OTHER): Payer: 59

## 2015-01-27 DIAGNOSIS — E890 Postprocedural hypothyroidism: Secondary | ICD-10-CM

## 2015-01-27 DIAGNOSIS — Z1159 Encounter for screening for other viral diseases: Secondary | ICD-10-CM

## 2015-01-27 DIAGNOSIS — E876 Hypokalemia: Secondary | ICD-10-CM | POA: Diagnosis not present

## 2015-01-27 LAB — BASIC METABOLIC PANEL
BUN: 13 mg/dL (ref 6–23)
CHLORIDE: 106 meq/L (ref 96–112)
CO2: 30 mEq/L (ref 19–32)
CREATININE: 0.82 mg/dL (ref 0.40–1.20)
Calcium: 9.4 mg/dL (ref 8.4–10.5)
GFR: 90.7 mL/min (ref >60.00)
GLUCOSE: 62 mg/dL — AB (ref 70–99)
POTASSIUM: 3.3 meq/L — AB (ref 3.5–5.1)
Sodium: 141 mEq/L (ref 135–145)

## 2015-01-27 LAB — TSH: TSH: 2.33 u[IU]/mL (ref 0.35–4.50)

## 2015-01-27 LAB — T4, FREE: Free T4: 0.86 ng/dL (ref 0.60–1.60)

## 2015-01-28 ENCOUNTER — Ambulatory Visit: Payer: 59

## 2015-01-28 LAB — HEPATITIS C ANTIBODY: HCV AB: NEGATIVE

## 2015-01-30 ENCOUNTER — Ambulatory Visit (INDEPENDENT_AMBULATORY_CARE_PROVIDER_SITE_OTHER): Payer: 59 | Admitting: Endocrinology

## 2015-01-30 ENCOUNTER — Ambulatory Visit (INDEPENDENT_AMBULATORY_CARE_PROVIDER_SITE_OTHER): Payer: 59

## 2015-01-30 VITALS — BP 160/82 | HR 78 | Temp 98.5°F | Resp 14 | Ht 66.0 in | Wt 179.0 lb

## 2015-01-30 DIAGNOSIS — E876 Hypokalemia: Secondary | ICD-10-CM | POA: Diagnosis not present

## 2015-01-30 DIAGNOSIS — E890 Postprocedural hypothyroidism: Secondary | ICD-10-CM

## 2015-01-30 DIAGNOSIS — J309 Allergic rhinitis, unspecified: Secondary | ICD-10-CM

## 2015-01-30 NOTE — Progress Notes (Signed)
Patient ID: Ashley Larsen, female   DOB: 1950-09-30, 64 y.o.   MRN: [id number]   Reason for Appointment:  Post ablative hypothyroidism, followup   History of Present Illness:   She had hyperthyroidism first diagnosed in 01/2013 and treated with radioactive iodine on 05/03/13 with 12 mCi She became hypothyroid subsequently and in 6/15 with a free T4 level of 0.57 she was started on 112 mcg Synthroid  Recent history: Her dose was reduced to 88 mcg in 07/2013 because of relatively high free T4 of 1.4 and low TSH  Since her TSH was still relatively low in 12/15 she was told to take only 6-1/2 tablets a week  Since her last visit she has been taking 75 g levothyroxine daily  She does not complain of unusual fatigue although she does wake up feeling tired   Nwo complaining of cold intolerance  She has been regular with her Synthroid dose before breakfast   Wt Readings from Last 3 Encounters:  01/30/15 179 lb (81.194 kg)  01/21/15 180 lb (81.647 kg)  08/14/14 182 lb (82.555 kg)   Labs:   Lab Results  Component Value Date   TSH 2.33 01/27/2015   TSH 0.89 07/24/2014   TSH 0.40 03/27/2014   FREET4 0.86 01/27/2015   FREET4 0.97 07/24/2014   FREET4 1.24 03/27/2014        OPHTHALMOPATHY:  She has been using the prism lens to control diplopia  She she has been seen by neuro ophthalmologist regularly and is going to be seen in follow-up soon to assess need for further management changes Currently not on Maxzide and is not complaining of excessive swelling of the eyes  She is keeping  the head of her bed  elevated on 2 pillows     Medication List       This list is accurate as of: 01/30/15 11:09 AM.  Always use your most recent med list.               fexofenadine 180 MG tablet  Commonly known as:  ALLEGRA  Take 1 tablet (180 mg total) by mouth daily as needed for allergies.     fluconazole 150 MG tablet  Commonly known as:  DIFLUCAN  Take 1 tablet (150 mg total) by mouth once.     HYDROcodone-acetaminophen 5-325 MG tablet  Commonly known as:  NORCO/VICODIN  Take 1 tablet by mouth every 8 (eight) hours as needed for severe pain.     levothyroxine 75 MCG tablet  Commonly known as:  SYNTHROID, LEVOTHROID  Take 1 tablet (75 mcg total) by mouth daily.     loratadine 10 MG tablet  Commonly known as:  CLARITIN  Take 1 tablet (10 mg total) by mouth daily.     LOSARTAN POTASSIUM PO  Take 100 mg by mouth daily.     NON FORMULARY  Allergies vaccines 1:10 weekly [place]     promethazine-codeine 6.25-10 MG/5ML syrup  Commonly known as:  PHENERGAN with  CODEINE  take 5 milliliters by mouth every 6 hours if needed for cough     Selenium 100 MCG Tabs  1 po bid for your eye condition     SYSTANE OP  Apply to eye daily.     triamcinolone cream 0.1 %  Commonly known as:  KENALOG  Apply 1 application topically 3 (three) times daily.            Past Medical History  Diagnosis Date  . Asthma   . Allergic rhinitis   . Insomnia   . Paresthesia   . GERD (gastroesophageal reflux disease)   . Diverticulosis   . Ovarian cyst   . Alopecia   . IBS (irritable bowel syndrome)   . Hypertension   . COPD (chronic obstructive pulmonary disease) ([place])   . Hyperthyroidism   . Cataract     bilateral cateracts    Past Surgical History  Procedure Laterality Date  . Tubal ligation    . Knee arthroscopy      Left  . Oophorectomy      Left  . Rotator cuff repair    . Colonoscopy      Family History  Problem Relation Age of Onset  . Hypertension    . Cancer Sister 37    colon  . Diabetes Sister   . Cancer Brother     brother - stomach ca  . Stomach cancer Brother   . Colon polyps Sister     and Brother  . Cancer Sister 64     colon  . Kidney disease Brother   . Diabetes Brother   . Diabetes Brother   . Heart disease Father   . Other Mother      sclerdermia  . Diabetes Paternal Grandfather   . Colon cancer Sister   . Esophageal cancer Neg Hx   . Rectal cancer Neg Hx     Social History:  reports that she has been smoking Cigarettes.  She has a 5 pack-year smoking history. She has never used smokeless tobacco. She reports that she does not drink alcohol or use illicit drugs.  Allergies:  Allergies  Allergen Reactions  . Metronidazole     REACTION: upset stomach  . Oxycodone-Acetaminophen     Review of Systems:   HYPERTENSION: Has history of high blood pressure now treated with losartan  History of hypokalemia on HCTZ which was stopped and Maxzide was stopped by PCP because of low potassium also  However her potassium is still low, has only occasional muscle cramps    Examination:   BP 142/88 mmHg  Pulse 78  Temp(Src) 98.5 F (36.9 C) (Oral)  Resp 14  Ht 5\' 6"  (1.676 m)  Wt 179 lb (81.194 kg)  BMI 28.91 kg/m2  SpO2 97%  Initial blood pressure 142/88, repeat by myself 160/82 both arms   Eyes:  mild eyelid swelling present along with mild proptosis  She has decreased lateral movement of the left eye.    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

## 2015-02-03 ENCOUNTER — Ambulatory Visit (INDEPENDENT_AMBULATORY_CARE_PROVIDER_SITE_OTHER): Payer: 59

## 2015-02-03 DIAGNOSIS — J309 Allergic rhinitis, unspecified: Secondary | ICD-10-CM | POA: Diagnosis not present

## 2015-02-12 ENCOUNTER — Ambulatory Visit (INDEPENDENT_AMBULATORY_CARE_PROVIDER_SITE_OTHER): Payer: 59

## 2015-02-12 DIAGNOSIS — J309 Allergic rhinitis, unspecified: Secondary | ICD-10-CM | POA: Diagnosis not present

## 2015-02-20 ENCOUNTER — Ambulatory Visit: Payer: 59

## 2015-02-25 ENCOUNTER — Ambulatory Visit: Payer: Self-pay

## 2015-03-03 ENCOUNTER — Ambulatory Visit (INDEPENDENT_AMBULATORY_CARE_PROVIDER_SITE_OTHER): Payer: BLUE CROSS/BLUE SHIELD

## 2015-03-03 DIAGNOSIS — J309 Allergic rhinitis, unspecified: Secondary | ICD-10-CM | POA: Diagnosis not present

## 2015-03-10 ENCOUNTER — Ambulatory Visit (INDEPENDENT_AMBULATORY_CARE_PROVIDER_SITE_OTHER): Payer: BLUE CROSS/BLUE SHIELD

## 2015-03-10 DIAGNOSIS — J309 Allergic rhinitis, unspecified: Secondary | ICD-10-CM

## 2015-03-13 ENCOUNTER — Encounter: Payer: Self-pay | Admitting: Internal Medicine

## 2015-03-13 ENCOUNTER — Ambulatory Visit (INDEPENDENT_AMBULATORY_CARE_PROVIDER_SITE_OTHER): Payer: BLUE CROSS/BLUE SHIELD | Admitting: Internal Medicine

## 2015-03-13 VITALS — BP 164/108 | HR 98 | Temp 98.2°F | Ht 66.0 in | Wt 180.0 lb

## 2015-03-13 DIAGNOSIS — J209 Acute bronchitis, unspecified: Secondary | ICD-10-CM | POA: Diagnosis not present

## 2015-03-13 DIAGNOSIS — I10 Essential (primary) hypertension: Secondary | ICD-10-CM | POA: Diagnosis not present

## 2015-03-13 DIAGNOSIS — J309 Allergic rhinitis, unspecified: Secondary | ICD-10-CM | POA: Diagnosis not present

## 2015-03-13 MED ORDER — HYDROCHLOROTHIAZIDE 25 MG PO TABS
25.0000 mg | ORAL_TABLET | Freq: Every day | ORAL | Status: DC
Start: 2015-03-13 — End: 2015-04-12

## 2015-03-13 MED ORDER — AMOXICILLIN 500 MG PO CAPS: 500.0000 mg | ORAL_CAPSULE | Freq: Three times a day (TID) | ORAL | Status: DC

## 2015-03-13 NOTE — Progress Notes (Signed)
Pre visit review using our clinic review tool, if applicable. No additional management support is needed unless otherwise documented below in the visit note. 

## 2015-03-13 NOTE — Progress Notes (Signed)
   Subjective:    Patient ID: Ashley Larsen, female    DOB: 1950-04-26, 65 y.o.   MRN: [id number]  HPI  She describes frontal and maxillary sinus area pressure associated with rhinitis and watery eyes. She's also had a cough with yellow-green sputum over the past 4 days. She did have some epistaxis today.  Blood pressure has been suboptimally controlled with ranges 150+ over 92 at the pharmacy.  She does have Graves' disease and is followed by Dr. Aline August, [facility] ophthalmology.    Review of Systems Extrinsic symptoms of sneezing, or angioedema are denied. There is no wheezing or  paroxysmal nocturnal dyspnea.    Objective:   Physical Exam  Pertinent or positive findings include : Globes are prominent with suggestion of some proptosis. There is intermittent superior/medial deviation of the left eye. There is mild erythema of the nasal mucosa.  General appearance:Adequately nourished; no acute distress or increased work of breathing is present.    Lymphatic: No  lymphadenopathy about the head, neck, or axilla .  Eyes: No conjunctival inflammation or lid edema is present. There is no scleral icterus.  Ears:  External ear exam shows no significant lesions or deformities.  Otoscopic examination reveals clear canals, tympanic membranes are intact bilaterally without bulging, retraction, inflammation or discharge.  Nose:  External nasal examination shows no deformity or inflammation. No septal dislocation or deviation.No obstruction to airflow.   Oral exam: Dental hygiene is good; lips and gums are healthy appearing.There is no oropharyngeal erythema or exudate .  Neck:  No deformities, masses, or tenderness noted.   Supple with full range of motion without pain.   Heart:  Normal rate and regular rhythm. S1 and S2 normal without gallop, murmur, click, rub or other extra sounds.   Lungs:Chest clear to auscultation; no wheezes, rhonchi,rales ,or rubs present.  Extremities:  No  cyanosis, edema, or clubbing  noted   Skin: Warm & dry w/o tenting . No significant lesions or rash.     Assessment & Plan:  #1 acute bronchitis  #2 uncontrolled hypertension  Plan: See orders and recommendations

## 2015-03-13 NOTE — Patient Instructions (Signed)
Plain Mucinex (NOT D) for thick secretions ;force NON dairy fluids .   Nasal cleansing in the shower as discussed with lather of mild shampoo.After 10 seconds wash off lather while  exhaling through nostrils. Make sure that all residual soap is removed to prevent irritation.  Fluticasone 1 spray in each nostril twice a day as needed. Use the "crossover" technique into opposite nostril spraying toward opposite ear @ 45 degree angle, not straight up into nostril.  Plain Allegra (NOT D )  160 daily , Loratidine 10 mg , OR Zyrtec 10 mg @ bedtime  as needed for itchy eyes & sneezing.  Minimal Blood Pressure Goal= AVERAGE < 140/90;  Ideal is an AVERAGE < 135/85. This AVERAGE should be calculated from @ least 5-7 BP readings taken @ different times of day on different days of week. You should not respond to isolated BP readings , but rather the AVERAGE for that week .Please bring your  blood pressure cuff to office visits to verify that it is reliable.It  can also be checked against the blood pressure device at the pharmacy. Finger or wrist cuffs are not dependable; an arm cuff is.

## 2015-03-21 ENCOUNTER — Ambulatory Visit (INDEPENDENT_AMBULATORY_CARE_PROVIDER_SITE_OTHER): Payer: BLUE CROSS/BLUE SHIELD

## 2015-03-21 DIAGNOSIS — J309 Allergic rhinitis, unspecified: Secondary | ICD-10-CM

## 2015-03-27 ENCOUNTER — Ambulatory Visit (INDEPENDENT_AMBULATORY_CARE_PROVIDER_SITE_OTHER): Payer: BLUE CROSS/BLUE SHIELD

## 2015-03-27 DIAGNOSIS — J309 Allergic rhinitis, unspecified: Secondary | ICD-10-CM | POA: Diagnosis not present

## 2015-03-31 ENCOUNTER — Telehealth: Payer: Self-pay | Admitting: Internal Medicine

## 2015-03-31 NOTE — Telephone Encounter (Signed)
pls see any provider ASAP - needs to be seen Thx

## 2015-03-31 NOTE — Telephone Encounter (Signed)
States she came into office on 1/26 and seen Dr. Linna Darner. Patient was given amoxicillin.  States that she is not feeling any better.  She has a really bad cough and states every time she coughs her chest hurts.  States she has a temperature and that her back and shoulders hurt really bad.  Would like to know if Dr. Alain Marion would change the amoxicillin she is on to something else.  Please follow back up with patient.

## 2015-04-01 ENCOUNTER — Ambulatory Visit: Payer: BLUE CROSS/BLUE SHIELD | Admitting: Adult Health

## 2015-04-01 ENCOUNTER — Ambulatory Visit (INDEPENDENT_AMBULATORY_CARE_PROVIDER_SITE_OTHER): Payer: BLUE CROSS/BLUE SHIELD | Admitting: Adult Health

## 2015-04-01 VITALS — BP 90/68 | HR 127 | Temp 98.6°F | Wt 175.0 lb

## 2015-04-01 DIAGNOSIS — J0100 Acute maxillary sinusitis, unspecified: Secondary | ICD-10-CM | POA: Diagnosis not present

## 2015-04-01 DIAGNOSIS — J209 Acute bronchitis, unspecified: Secondary | ICD-10-CM | POA: Diagnosis not present

## 2015-04-01 DIAGNOSIS — R509 Fever, unspecified: Secondary | ICD-10-CM

## 2015-04-01 LAB — POCT INFLUENZA A/B
INFLUENZA A, POC: NEGATIVE
INFLUENZA B, POC: NEGATIVE

## 2015-04-01 MED ORDER — METHYLPREDNISOLONE 4 MG PO TBPK: ORAL_TABLET | ORAL | Status: DC

## 2015-04-01 MED ORDER — DOXYCYCLINE HYCLATE 100 MG PO CAPS: 100.0000 mg | ORAL_CAPSULE | Freq: Two times a day (BID) | ORAL | Status: DC

## 2015-04-01 MED ORDER — PROMETHAZINE-CODEINE 6.25-10 MG/5ML PO SYRP: ORAL_SOLUTION | ORAL | Status: DC

## 2015-04-01 NOTE — Patient Instructions (Addendum)
It was great meeting you today, but I am sorry you are still feeling this bad.   I have sent in a prescription for prednisone, take this as directed  Doxycycline will be your antibiotic. Take this twice a day for 10 days.   Make sure you stay well hydrated.   Stop the HCTZ until you are adequately hydrated  Follow up with PCP if no improvement in the next 2-3 days.

## 2015-04-01 NOTE — Progress Notes (Signed)
Pre visit review using our clinic review tool, if applicable. No additional management support is needed unless otherwise documented below in the visit note. 

## 2015-04-01 NOTE — Telephone Encounter (Signed)
No openings at [facility] today.  Have one opening with Ashley Larsen at [place] location today @ 3:30. OV scheduled. Pt informed

## 2015-04-01 NOTE — Addendum Note (Signed)
Addended by: Colleen Can on: 04/01/2015 04:53 PM   Modules accepted: Orders

## 2015-04-01 NOTE — Progress Notes (Addendum)
Subjective:    Patient ID: Ashley Larsen, female    DOB: 07/05/1950, 65 y.o.   MRN: [id number]  HPI  65 year old female who presents to the office today for continued cough, rhinorrhea, sinus pain and pressure and generalized body aches. She was seen by Dr. Linna Darner on 03/13/2015 and was prescribed Amoxil 500mg , Phenergyn with codeine cough syrup and started on HCTZ 25mg  for continued hypertension. She reports that she started to feel better during the course of antibiotics.   Today she reports that she has a cough, generalized body aches, especially in the chest with coughing, fever up to 122f at home and maxillary sinus pain and pressure.   Saturday and Sunday she had diarrhea " anything I ate would run right back through me."   Today she had a glass of water and cup of tea. She is not eating or drinking " because everything taste's nasty"  Review of Systems  Constitutional: Positive for fever, activity change, appetite change and fatigue. Negative for unexpected weight change.  HENT: Positive for congestion, rhinorrhea, sinus pressure and sore throat.   Respiratory: Positive for cough, shortness of breath and wheezing.   Cardiovascular: Negative.   Gastrointestinal: Negative for nausea, vomiting, diarrhea and abdominal distention.  Skin: Negative.   Neurological: Positive for weakness.  Hematological: Negative for adenopathy.  Psychiatric/Behavioral: Positive for sleep disturbance.   Past Medical History  Diagnosis Date  . Asthma   . Allergic rhinitis   . Insomnia   . Paresthesia   . GERD (gastroesophageal reflux disease)   . Diverticulosis   . Ovarian cyst   . Alopecia   . IBS (irritable bowel syndrome)   . Hypertension   . COPD (chronic obstructive pulmonary disease) ([place])   . Hyperthyroidism   . Cataract     bilateral cateracts    Social History   Social History  . Marital Status: Married    Spouse Name: N/A  . Number of Children: N/A  . Years of Education: N/A     Occupational History  . Retail sales [employer]   Social History Main Topics  . Smoking status: Light Tobacco Smoker -- 0.25 packs/day for 20 years    Types: Cigarettes  . Smokeless tobacco: Never Used     Comment: smoking 5-6 cigs per week 01/23/14  . Alcohol Use: No  . Drug Use: No  . Sexual Activity: Yes   Other Topics Concern  . Not on file   Social History Narrative    Past Surgical History  Procedure Laterality Date  . Tubal ligation    . Knee arthroscopy      Left  . Oophorectomy      Left  . Rotator cuff repair    . Colonoscopy      Family History  Problem Relation Age of Onset  . Hypertension    . Cancer Sister 57    colon  . Diabetes Sister   . Cancer Brother     brother - stomach ca  . Stomach cancer Brother   . Colon polyps Sister     and Brother  . Cancer Sister 77     colon  . Kidney disease Brother   . Diabetes Brother   . Diabetes Brother   . Heart disease Father   . Other Mother     sclerdermia  . Diabetes Paternal Grandfather   . Colon cancer Sister   . Esophageal cancer Neg Hx   . Rectal cancer Neg Hx  Allergies  Allergen Reactions  . Metronidazole     REACTION: upset stomach  . Oxycodone-Acetaminophen     Current Outpatient Prescriptions on File Prior to Visit  Medication Sig Dispense Refill  . fexofenadine (ALLEGRA) 180 MG tablet Take 1 tablet (180 mg total) by mouth daily as needed for allergies. 90 tablet 3  . hydrochlorothiazide (HYDRODIURIL) 25 MG tablet Take 1 tablet (25 mg total) by mouth daily. 90 tablet 3  . HYDROcodone-acetaminophen (NORCO/VICODIN) 5-325 MG tablet Take 1 tablet by mouth every 8 (eight) hours as needed for severe pain. 100 tablet 0  . levothyroxine (SYNTHROID, LEVOTHROID) 75 MCG tablet Take 1 tablet (75 mcg total) by mouth daily. 90 tablet 3  . loratadine (CLARITIN) 10 MG tablet Take 1 tablet (10 mg total) by mouth daily. 100 tablet 3  . losartan (COZAAR) 100 MG tablet   10  . NON FORMULARY  Allergies vaccines 1:10 weekly GH    . Polyethyl Glycol-Propyl Glycol (SYSTANE OP) Apply to eye daily.    . Selenium 100 MCG TABS 1 po bid for your eye condition 200 each 2  . triamcinolone cream (KENALOG) 0.1 % Apply 1 application topically 3 (three) times daily. 90 g 3  . fluconazole (DIFLUCAN) 150 MG tablet Take 1 tablet (150 mg total) by mouth once. (Patient not taking: Reported on 04/01/2015) 1 tablet 1   No current facility-administered medications on file prior to visit.    BP 90/68 mmHg  Pulse 127  Temp(Src) 98.6 F (37 C) (Oral)  Wt 175 lb (79.379 kg)  SpO2 97%       Objective:   Physical Exam  Constitutional: She is oriented to person, place, and time. She appears well-developed and well-nourished. No distress.  Cardiovascular: Normal rate and regular rhythm.  Exam reveals friction rub. Exam reveals no gallop.   No murmur heard. Pulmonary/Chest: Effort normal. No respiratory distress. She has no decreased breath sounds. She has wheezes in the right upper field, the right middle field, the right lower field, the left upper field, the left middle field and the left lower field. She has no rhonchi. She has no rales. She exhibits no tenderness.  Musculoskeletal: Normal range of motion.  Neurological: She is alert and oriented to person, place, and time.  Skin: Skin is warm and dry. No rash noted. She is not diaphoretic. No erythema. No pallor.  Psychiatric: She has a normal mood and affect. Her behavior is normal. Judgment and thought content normal.  Vitals reviewed.     Assessment & Plan:  1. Acute bronchitis, unspecified organism - promethazine-codeine (PHENERGAN WITH CODEINE) 6.25-10 MG/5ML syrup; take 5 milliliters by mouth every 6 hours if needed for cough  Dispense: 300 mL; Refill: 0 - doxycycline (VIBRAMYCIN) 100 MG capsule; Take 1 capsule (100 mg total) by mouth 2 (two) times daily.  Dispense: 20 capsule; Refill: 0 - methylPREDNISolone (MEDROL DOSEPAK) 4 MG TBPK  tablet; Take as directed  Dispense: 21 tablet; Refill: 0  2. Subacute maxillary sinusitis - doxycycline (VIBRAMYCIN) 100 MG capsule; Take 1 capsule (100 mg total) by mouth 2 (two) times daily.  Dispense: 20 capsule; Refill: 0 - methylPREDNISolone (MEDROL DOSEPAK) 4 MG TBPK tablet; Take as directed  Dispense: 21 tablet; Refill: 0 - Needs to stay hydrated. Advised Pedialyte, gatorade, or popsicles. - go to the ER if unable to keep fluids down or with continued diarrhea - Stop HCTZ until becoming hydrated.   - Flu swab - Negative

## 2015-04-03 ENCOUNTER — Ambulatory Visit: Payer: BLUE CROSS/BLUE SHIELD

## 2015-04-09 ENCOUNTER — Inpatient Hospital Stay (HOSPITAL_COMMUNITY)
Admission: EM | Admit: 2015-04-09 | Discharge: 2015-04-12 | DRG: 176 | Disposition: A | Discharge status: Home / Self Care | Payer: BLUE CROSS/BLUE SHIELD | Attending: Family Medicine | Admitting: Family Medicine

## 2015-04-09 ENCOUNTER — Emergency Department (HOSPITAL_COMMUNITY): Payer: BLUE CROSS/BLUE SHIELD

## 2015-04-09 ENCOUNTER — Encounter (HOSPITAL_COMMUNITY): Payer: Self-pay | Admitting: Emergency Medicine

## 2015-04-09 DIAGNOSIS — J449 Chronic obstructive pulmonary disease, unspecified: Secondary | ICD-10-CM | POA: Diagnosis present

## 2015-04-09 DIAGNOSIS — E860 Dehydration: Secondary | ICD-10-CM | POA: Diagnosis present

## 2015-04-09 DIAGNOSIS — F17200 Nicotine dependence, unspecified, uncomplicated: Secondary | ICD-10-CM

## 2015-04-09 DIAGNOSIS — F17210 Nicotine dependence, cigarettes, uncomplicated: Secondary | ICD-10-CM | POA: Diagnosis present

## 2015-04-09 DIAGNOSIS — K589 Irritable bowel syndrome without diarrhea: Secondary | ICD-10-CM | POA: Diagnosis present

## 2015-04-09 DIAGNOSIS — I10 Essential (primary) hypertension: Secondary | ICD-10-CM | POA: Diagnosis present

## 2015-04-09 DIAGNOSIS — A419 Sepsis, unspecified organism: Secondary | ICD-10-CM | POA: Diagnosis present

## 2015-04-09 DIAGNOSIS — Z720 Tobacco use: Secondary | ICD-10-CM | POA: Diagnosis present

## 2015-04-09 DIAGNOSIS — R6510 Systemic inflammatory response syndrome (SIRS) of non-infectious origin without acute organ dysfunction: Secondary | ICD-10-CM | POA: Diagnosis present

## 2015-04-09 DIAGNOSIS — M545 Low back pain: Secondary | ICD-10-CM | POA: Diagnosis present

## 2015-04-09 DIAGNOSIS — R634 Abnormal weight loss: Secondary | ICD-10-CM | POA: Diagnosis present

## 2015-04-09 DIAGNOSIS — E039 Hypothyroidism, unspecified: Secondary | ICD-10-CM | POA: Diagnosis present

## 2015-04-09 DIAGNOSIS — K219 Gastro-esophageal reflux disease without esophagitis: Secondary | ICD-10-CM | POA: Diagnosis present

## 2015-04-09 DIAGNOSIS — E861 Hypovolemia: Secondary | ICD-10-CM | POA: Diagnosis present

## 2015-04-09 DIAGNOSIS — M549 Dorsalgia, unspecified: Secondary | ICD-10-CM | POA: Diagnosis present

## 2015-04-09 DIAGNOSIS — Z86711 Personal history of pulmonary embolism: Secondary | ICD-10-CM | POA: Diagnosis present

## 2015-04-09 DIAGNOSIS — I2699 Other pulmonary embolism without acute cor pulmonale: Secondary | ICD-10-CM | POA: Diagnosis present

## 2015-04-09 DIAGNOSIS — I959 Hypotension, unspecified: Secondary | ICD-10-CM | POA: Diagnosis present

## 2015-04-09 DIAGNOSIS — R5383 Other fatigue: Secondary | ICD-10-CM | POA: Diagnosis not present

## 2015-04-09 DIAGNOSIS — N179 Acute kidney failure, unspecified: Secondary | ICD-10-CM | POA: Diagnosis present

## 2015-04-09 DIAGNOSIS — E0500 Thyrotoxicosis with diffuse goiter without thyrotoxic crisis or storm: Secondary | ICD-10-CM | POA: Diagnosis present

## 2015-04-09 DIAGNOSIS — H269 Unspecified cataract: Secondary | ICD-10-CM | POA: Diagnosis present

## 2015-04-09 DIAGNOSIS — R0600 Dyspnea, unspecified: Secondary | ICD-10-CM

## 2015-04-09 DIAGNOSIS — I9589 Other hypotension: Secondary | ICD-10-CM | POA: Diagnosis not present

## 2015-04-09 LAB — CBC
HCT: 52 % — ABNORMAL HIGH (ref 36.0–46.0)
Hemoglobin: 18.1 g/dL — ABNORMAL HIGH (ref 12.0–15.0)
MCH: 31.1 pg (ref 26.0–34.0)
MCHC: 34.8 g/dL (ref 30.0–36.0)
MCV: 89.3 fL (ref 78.0–100.0)
PLATELETS: 286 10*3/uL (ref 150–400)
RBC: 5.82 MIL/uL — ABNORMAL HIGH (ref 3.87–5.11)
RDW: 14.1 % (ref 11.5–15.5)
WBC: 10.6 10*3/uL — ABNORMAL HIGH (ref 4.0–10.5)

## 2015-04-09 LAB — I-STAT CG4 LACTIC ACID, ED: Lactic Acid, Venous: 2.32 mmol/L (ref 0.5–2.0)

## 2015-04-09 LAB — BASIC METABOLIC PANEL
Anion gap: 11 (ref 5–15)
BUN: 25 mg/dL — ABNORMAL HIGH (ref 6–20)
CALCIUM: 8.9 mg/dL (ref 8.9–10.3)
CHLORIDE: 109 mmol/L (ref 101–111)
CO2: 21 mmol/L — AB (ref 22–32)
CREATININE: 1.33 mg/dL — AB (ref 0.44–1.00)
GFR calc Af Amer: 49 mL/min — ABNORMAL LOW (ref >60)
GFR calc non Af Amer: 42 mL/min — ABNORMAL LOW (ref >60)
GLUCOSE: 121 mg/dL — AB (ref 65–99)
Potassium: 3.6 mmol/L (ref 3.5–5.1)
Sodium: 141 mmol/L (ref 135–145)

## 2015-04-09 LAB — URINALYSIS, ROUTINE W REFLEX MICROSCOPIC
Bilirubin Urine: NEGATIVE
GLUCOSE, UA: NEGATIVE mg/dL
Hgb urine dipstick: NEGATIVE
KETONES UR: NEGATIVE mg/dL
LEUKOCYTES UA: NEGATIVE
NITRITE: NEGATIVE
PH: 6 (ref 5.0–8.0)
Protein, ur: NEGATIVE mg/dL
SPECIFIC GRAVITY, URINE: 1.028 (ref 1.005–1.030)

## 2015-04-09 LAB — I-STAT TROPONIN, ED: TROPONIN I, POC: 0 ng/mL (ref 0.00–0.08)

## 2015-04-09 LAB — T4, FREE: Free T4: 0.85 ng/dL (ref 0.61–1.12)

## 2015-04-09 LAB — LACTIC ACID, PLASMA: Lactic Acid, Venous: 1.7 mmol/L (ref 0.5–2.0)

## 2015-04-09 LAB — PROCALCITONIN

## 2015-04-09 LAB — TSH: TSH: 10.972 u[IU]/mL — AB (ref 0.350–4.500)

## 2015-04-09 LAB — BRAIN NATRIURETIC PEPTIDE: B NATRIURETIC PEPTIDE 5: 37.5 pg/mL (ref 0.0–100.0)

## 2015-04-09 MED ORDER — ACETAMINOPHEN 650 MG RE SUPP: 650.0000 mg | Freq: Four times a day (QID) | RECTAL | Status: DC | PRN

## 2015-04-09 MED ORDER — LEVOTHYROXINE SODIUM 75 MCG PO TABS
75.0000 ug | ORAL_TABLET | Freq: Every day | ORAL | Status: DC
  Administered 2015-04-10 – 2015-04-12 (×3): 75 ug via ORAL
  Filled 2015-04-09 (×4): qty 1

## 2015-04-09 MED ORDER — SODIUM CHLORIDE 0.9 % IV BOLUS (SEPSIS)
500.0000 mL | INTRAVENOUS | Status: AC
  Administered 2015-04-09: 500 mL via INTRAVENOUS

## 2015-04-09 MED ORDER — PIPERACILLIN-TAZOBACTAM 3.375 G IVPB 30 MIN
3.3750 g | Freq: Once | INTRAVENOUS | Status: AC
  Administered 2015-04-09: 3.375 g via INTRAVENOUS
  Filled 2015-04-09: qty 50

## 2015-04-09 MED ORDER — BISACODYL 10 MG RE SUPP: 10.0000 mg | Freq: Every day | RECTAL | Status: DC | PRN

## 2015-04-09 MED ORDER — FENTANYL CITRATE (PF) 100 MCG/2ML IJ SOLN
50.0000 ug | Freq: Once | INTRAMUSCULAR | Status: AC
  Administered 2015-04-09: 50 ug via INTRAVENOUS
  Filled 2015-04-09: qty 2

## 2015-04-09 MED ORDER — SODIUM CHLORIDE 0.9 % IV BOLUS (SEPSIS)
500.0000 mL | Freq: Once | INTRAVENOUS | Status: AC
  Administered 2015-04-09: 500 mL via INTRAVENOUS

## 2015-04-09 MED ORDER — HEPARIN (PORCINE) IN NACL 100-0.45 UNIT/ML-% IJ SOLN
950.0000 [IU]/h | INTRAMUSCULAR | Status: DC
  Administered 2015-04-09: 1200 [IU]/h via INTRAVENOUS
  Administered 2015-04-10: 950 [IU]/h via INTRAVENOUS
  Filled 2015-04-09: qty 250

## 2015-04-09 MED ORDER — VANCOMYCIN HCL IN DEXTROSE 750-5 MG/150ML-% IV SOLN
750.0000 mg | Freq: Two times a day (BID) | INTRAVENOUS | Status: DC
  Administered 2015-04-10 – 2015-04-11 (×3): 750 mg via INTRAVENOUS
  Filled 2015-04-09 (×5): qty 150

## 2015-04-09 MED ORDER — PIPERACILLIN-TAZOBACTAM 3.375 G IVPB
3.3750 g | Freq: Three times a day (TID) | INTRAVENOUS | Status: DC
  Administered 2015-04-10 – 2015-04-11 (×4): 3.375 g via INTRAVENOUS
  Filled 2015-04-09 (×6): qty 50

## 2015-04-09 MED ORDER — ACETAMINOPHEN 325 MG PO TABS: 650.0000 mg | ORAL_TABLET | Freq: Four times a day (QID) | ORAL | Status: DC | PRN

## 2015-04-09 MED ORDER — VANCOMYCIN HCL 10 G IV SOLR
1500.0000 mg | Freq: Once | INTRAVENOUS | Status: AC
  Administered 2015-04-09: 1500 mg via INTRAVENOUS
  Filled 2015-04-09: qty 1500

## 2015-04-09 MED ORDER — HYDROCODONE-ACETAMINOPHEN 5-325 MG PO TABS
1.0000 | ORAL_TABLET | ORAL | Status: DC | PRN
  Administered 2015-04-10 – 2015-04-11 (×5): 2 via ORAL
  Administered 2015-04-12: 1 via ORAL
  Filled 2015-04-09: qty 1
  Filled 2015-04-09 (×5): qty 2

## 2015-04-09 MED ORDER — DOCUSATE SODIUM 100 MG PO CAPS
100.0000 mg | ORAL_CAPSULE | Freq: Two times a day (BID) | ORAL | Status: DC
  Administered 2015-04-10 – 2015-04-12 (×5): 100 mg via ORAL
  Filled 2015-04-09 (×6): qty 1

## 2015-04-09 MED ORDER — SODIUM CHLORIDE 0.9 % IV BOLUS (SEPSIS)
1000.0000 mL | Freq: Once | INTRAVENOUS | Status: AC
  Administered 2015-04-09: 1000 mL via INTRAVENOUS

## 2015-04-09 MED ORDER — SODIUM CHLORIDE 0.9 % IV SOLN
INTRAVENOUS | Status: AC
  Administered 2015-04-10: via INTRAVENOUS

## 2015-04-09 MED ORDER — VANCOMYCIN HCL IN DEXTROSE 1-5 GM/200ML-% IV SOLN: 1000.0000 mg | Freq: Once | INTRAVENOUS | Status: DC

## 2015-04-09 MED ORDER — HEPARIN BOLUS VIA INFUSION
5000.0000 [IU] | Freq: Once | INTRAVENOUS | Status: AC
  Administered 2015-04-09: 5000 [IU] via INTRAVENOUS
  Filled 2015-04-09: qty 5000

## 2015-04-09 MED ORDER — SODIUM CHLORIDE 0.9 % IV BOLUS (SEPSIS)
1000.0000 mL | INTRAVENOUS | Status: AC
  Administered 2015-04-09 (×2): 1000 mL via INTRAVENOUS

## 2015-04-09 MED ORDER — ONDANSETRON HCL 4 MG/2ML IJ SOLN: 4.0000 mg | Freq: Four times a day (QID) | INTRAMUSCULAR | Status: DC | PRN

## 2015-04-09 MED ORDER — ONDANSETRON HCL 4 MG PO TABS: 4.0000 mg | ORAL_TABLET | Freq: Four times a day (QID) | ORAL | Status: DC | PRN

## 2015-04-09 MED ORDER — POLYETHYLENE GLYCOL 3350 17 G PO PACK: 17.0000 g | PACK | Freq: Every day | ORAL | Status: DC | PRN

## 2015-04-09 MED ORDER — SODIUM CHLORIDE 0.9% FLUSH
3.0000 mL | Freq: Two times a day (BID) | INTRAVENOUS | Status: DC
  Administered 2015-04-10 – 2015-04-11 (×3): 3 mL via INTRAVENOUS

## 2015-04-09 MED ORDER — IOHEXOL 350 MG/ML SOLN
80.0000 mL | Freq: Once | INTRAVENOUS | Status: AC | PRN
  Administered 2015-04-09: 80 mL via INTRAVENOUS

## 2015-04-09 NOTE — Consult Note (Signed)
ANTICOAGULATION CONSULT NOTE - Initial Consult  Pharmacy Consult for heparin Indication: pulmonary embolus  Allergies  Allergen Reactions  . Metronidazole     REACTION: upset stomach  . Oxycodone-Acetaminophen     Patient Measurements: Height: 5\' 5"  (165.1 cm) Weight: 175 lb (79.379 kg) IBW/kg (Calculated) : 57 Heparin Dosing Weight: 73.7 kg  Vital Signs: Temp: 97.8 F (36.6 C) (02/22 1732) Temp Source: Oral (02/22 1732) BP: 99/79 mmHg (02/22 2000) Pulse Rate: 97 (02/22 1900)  Labs:  Recent Labs  04/09/15 1707  HGB 18.1*  HCT 52.0*  PLT 286  CREATININE 1.33*    Estimated Creatinine Clearance: 45.7 mL/min (by C-G formula based on Cr of 1.33).  Assessment: 65 yo female presented w/ SOB and fatigue. Pt has been hypotensive and tachycardic. CT angio shows RUL PE w/ right heart strain (RV/LV ratio = 1). No anticoag PTA.  Goal of Therapy:  Heparin level 0.3-0.7 units/ml Monitor platelets by anticoagulation protocol: Yes   Plan:  Give 5000 units bolus x 1 Start heparin infusion at 1200 units/hr Check anti-Xa level in 6 hours and daily while on heparin Continue to monitor H&H and platelets  Roma Schanz 04/09/2015,8:22 PM

## 2015-04-09 NOTE — H&P (Signed)
PCP:  Walker Kehr, MD  Immunology [facility] Referring provider  Dr. Rex Kras   Chief Complaint: Shortness of breath and left chest pain versus shoulder pain  HPI: Ashley Larsen is a 65 y.o. female   has a past medical history of Asthma; Allergic rhinitis; Insomnia; Paresthesia; GERD (gastroesophageal reflux disease); Diverticulosis; Ovarian cyst; Alopecia; IBS (irritable bowel syndrome); Hypertension; COPD (chronic obstructive pulmonary disease) ([place]); Hyperthyroidism; and Cataract.   Presented with 2 day history of sudden onset of shortness of breath left shoulder pain worsened taking deep breaths severe fatigue feeling of presyncope. She denies any long trips has no history of malignancy no personal history of PE or blood clots. Has family history of multiple onset of blood clots. Has history of miscarriage 1. Patient denies any fevers or chills she's been having prolonged upper URI symptoms and is a smoker. Reports 5 pound weight loss. In January patient was reporting URI type of symptoms was tested for influenza which was negative and was prescribed amoxicillin. She was seen  She thinking in December prescribed doxycycline and prednisone with no significant improvement She was seen last weekand was noted to be hypotensive down to 0000000 systolic and tachycardic up to 1:30 she was instructed to drink plenty of fluids but patient did not do so   IN ER: Hypotensive, Hg 18, CT angiogram showed PE with RV strain Lactic acid 2.32 she was given a total of 2-1/2 L her blood pressures initially in the 60s over 40s but now improving up to [id number] systolic    Regarding pertinent past history: graves disease, TSh 10.9 but T4 and T3 wnl  Hospitalist was called for admission for on [place] embolism, severe dehydration with hypotension, meeting SIRS criteria  Review of Systems:    Pertinent positives include: poor Po intake fatigue, weight loss   Constitutional:  No weight loss, night  sweats, Fevers, chills,  HEENT:  No headaches, Difficulty swallowing,Tooth/dental problems,Sore throat,  No sneezing, itching, ear ache, nasal congestion, post nasal drip,  Cardio-vascular:  No chest pain, Orthopnea, PND, anasarca, dizziness, palpitations.no Bilateral lower extremity swelling  GI:  No heartburn, indigestion, abdominal pain, nausea, vomiting, diarrhea, change in bowel habits, loss of appetite, melena, blood in stool, hematemesis Resp:  no shortness of breath at rest. No dyspnea on exertion, No excess mucus, no productive cough, No non-productive cough, No coughing up of blood.No change in color of mucus.No wheezing. Skin:  no rash or lesions. No jaundice GU:  no dysuria, change in color of urine, no urgency or frequency. No straining to urinate.  No flank pain.  Musculoskeletal:  No joint pain or no joint swelling. No decreased range of motion. No back pain.  Psych:  No change in mood or affect. No depression or anxiety. No memory loss.  Neuro: no localizing neurological complaints, no tingling, no weakness, no double vision, no gait abnormality, no slurred speech, no confusion  Otherwise ROS are negative except for above, 10 systems were reviewed  Past Medical History: Past Medical History  Diagnosis Date  . Asthma   . Allergic rhinitis   . Insomnia   . Paresthesia   . GERD (gastroesophageal reflux disease)   . Diverticulosis   . Ovarian cyst   . Alopecia   . IBS (irritable bowel syndrome)   . Hypertension   . COPD (chronic obstructive pulmonary disease) ([place])   . Hyperthyroidism   . Cataract     bilateral cateracts   Past Surgical History  Procedure Laterality Date  .  Tubal ligation    . Knee arthroscopy      Left  . Oophorectomy      Left  . Rotator cuff repair    . Colonoscopy       Medications: Prior to Admission medications   Medication Sig Start Date End Date Taking? Authorizing Provider  fexofenadine (ALLEGRA) 180 MG tablet Take 1 tablet  (180 mg total) by mouth daily as needed for allergies. 08/08/13  Yes Aleksei [name] V, MD  HYDROcodone-acetaminophen (NORCO/VICODIN) 5-325 MG tablet Take 1 tablet by mouth every 8 (eight) hours as needed for severe pain. 01/14/15 12/26/16 Yes Aleksei [name] V, MD  levothyroxine (SYNTHROID, LEVOTHROID) 75 MCG tablet Take 1 tablet (75 mcg total) by mouth daily. 07/31/14  Yes [name], MD  losartan (COZAAR) 100 MG tablet Take 100 mg by mouth daily.  02/26/15  Yes Historical Provider, MD  NON FORMULARY Allergies vaccines 1:10 weekly [place]   Yes Historical Provider, MD  Polyethyl Glycol-Propyl Glycol (SYSTANE OP) Place 1 drop into both eyes daily as needed. For dry eyes   Yes Historical Provider, MD  promethazine-codeine (PHENERGAN WITH CODEINE) 6.25-10 MG/5ML syrup take 5 milliliters by mouth every 6 hours if needed for cough 04/01/15  Yes Dorothyann Peng, NP  doxycycline (VIBRAMYCIN) 100 MG capsule Take 1 capsule (100 mg total) by mouth 2 (two) times daily. Patient not taking: Reported on 04/09/2015 04/01/15   Dorothyann Peng, NP  fluconazole (DIFLUCAN) 150 MG tablet Take 1 tablet (150 mg total) by mouth once. Patient not taking: Reported on 04/01/2015 08/14/14   Lew Dawes V, MD  hydrochlorothiazide (HYDRODIURIL) 25 MG tablet Take 1 tablet (25 mg total) by mouth daily. Patient not taking: Reported on 04/09/2015 03/13/15   Hendricks Limes, MD  loratadine (CLARITIN) 10 MG tablet Take 1 tablet (10 mg total) by mouth daily. Patient not taking: Reported on 04/09/2015 08/14/14   Cassandria Anger, MD  methylPREDNISolone (MEDROL DOSEPAK) 4 MG TBPK tablet Take as directed Patient not taking: Reported on 04/09/2015 04/01/15   Dorothyann Peng, NP  Selenium 100 MCG TABS 1 po bid for your eye condition Patient not taking: Reported on 04/09/2015 11/09/13   Lew Dawes V, MD  triamcinolone cream (KENALOG) 0.1 % Apply 1 application topically 3 (three) times daily. Patient not taking: Reported on 04/09/2015 08/08/13    Cassandria Anger, MD    Allergies:   Allergies  Allergen Reactions  . Metronidazole     REACTION: upset stomach  . Oxycodone-Acetaminophen     Social History:  Ambulatory   Independently  Lives at home  With family     reports that she has been smoking Cigarettes.  She has a 5 pack-year smoking history. She has never used smokeless tobacco. She reports that she does not drink alcohol or use illicit drugs.     Family History: family history includes Cancer in her brother; Cancer (age of onset: 3) in her sister; Cancer (age of onset: 16) in her sister; Colon cancer in her sister; Colon polyps in her sister; Diabetes in her brother, brother, paternal grandfather, and sister; Heart disease in her father; Kidney disease in her brother; Other in her mother; Stomach cancer in her brother. There is no history of Esophageal cancer or Rectal cancer.    Physical Exam: Patient Vitals for the past 24 hrs:  BP Temp Temp src Pulse Resp SpO2 Height Weight  04/09/15 2059 (!) 80/64 mmHg - - - - - - -  04/09/15 2015 90/57 mmHg - -  105 20 99 % - -  04/09/15 2000 99/79 mmHg - - - 21 - - -  04/09/15 1900 (!) 82/66 mmHg - - 97 - 98 % - -  04/09/15 1830 (!) 89/67 mmHg - - 98 - 98 % - -  04/09/15 1801 (!) 77/61 mmHg - - 61 17 100 % - -  04/09/15 1800 (!) 77/61 mmHg - - 110 - 97 % - -  04/09/15 1732 - 97.8 F (36.6 C) Oral - - - - -  04/09/15 1730 (!) 81/46 mmHg - - 116 - 98 % - -  04/09/15 1700 - 98.4 F (36.9 C) Oral (!) 128 16 98 % 5\' 5"  (1.651 m) 79.379 kg (175 lb)    1. General:  in No Acute distress 2. Psychological: Alert and   Oriented 3. Head/ENT:     Dry Mucous Membranes                          Head Non traumatic, neck supple                          Normal  Dentition 4. SKIN:  decreased Skin turgor,  Skin clean Dry and intact no rash 5. Heart: Regular rate and rhythm no  Murmur, Rub or gallop 6. Lungs:  no wheezes or crackles  Decreased inspiratory effort 7. Abdomen: Soft,  non-tender, Non distended 8. Lower extremities: no clubbing, cyanosis, or edema 9. Neurologically Grossly intact, moving all 4 extremities equally 10. MSK: Normal range of motion  body mass index is 29.12 kg/(m^2).   Labs on Admission:   Results for orders placed or performed during the hospital encounter of 04/09/15 (from the past 24 hour(s))  Basic metabolic panel     Status: Abnormal   Collection Time: 04/09/15  5:07 PM  Result Value Ref Range   Sodium 141 135 - 145 mmol/L   Potassium 3.6 3.5 - 5.1 mmol/L   Chloride 109 101 - 111 mmol/L   CO2 21 (L) 22 - 32 mmol/L   Glucose, Bld 121 (H) 65 - 99 mg/dL   BUN 25 (H) 6 - 20 mg/dL   Creatinine, Ser 1.33 (H) 0.44 - 1.00 mg/dL   Calcium 8.9 8.9 - 10.3 mg/dL   GFR calc non Af Amer 42 (L) >60 mL/min   GFR calc Af Amer 49 (L) >60 mL/min   Anion gap 11 5 - 15  CBC     Status: Abnormal   Collection Time: 04/09/15  5:07 PM  Result Value Ref Range   WBC 10.6 (H) 4.0 - 10.5 K/uL   RBC 5.82 (H) 3.87 - 5.11 MIL/uL   Hemoglobin 18.1 (H) 12.0 - 15.0 g/dL   HCT 52.0 (H) 36.0 - 46.0 %   MCV 89.3 78.0 - 100.0 fL   MCH 31.1 26.0 - 34.0 pg   MCHC 34.8 30.0 - 36.0 g/dL   RDW 14.1 11.5 - 15.5 %   Platelets 286 150 - 400 K/uL  TSH     Status: Abnormal   Collection Time: 04/09/15  6:01 PM  Result Value Ref Range   TSH 10.972 (H) 0.350 - 4.500 uIU/mL  T4, free     Status: None   Collection Time: 04/09/15  6:02 PM  Result Value Ref Range   Free T4 0.85 0.61 - 1.12 ng/dL  I-stat troponin, ED     Status: None   Collection Time:  04/09/15  6:04 PM  Result Value Ref Range   Troponin i, poc 0.00 0.00 - 0.08 ng/mL   Comment 3          I-Stat CG4 Lactic Acid, ED     Status: Abnormal   Collection Time: 04/09/15  6:06 PM  Result Value Ref Range   Lactic Acid, Venous 2.32 (HH) 0.5 - 2.0 mmol/L   Comment NOTIFIED PHYSICIAN   Urinalysis, Routine w reflex microscopic     Status: None   Collection Time: 04/09/15  8:19 PM  Result Value Ref Range    Color, Urine YELLOW YELLOW   APPearance CLEAR CLEAR   Specific Gravity, Urine 1.028 1.005 - 1.030   pH 6.0 5.0 - 8.0   Glucose, UA NEGATIVE NEGATIVE mg/dL   Hgb urine dipstick NEGATIVE NEGATIVE   Bilirubin Urine NEGATIVE NEGATIVE   Ketones, ur NEGATIVE NEGATIVE mg/dL   Protein, ur NEGATIVE NEGATIVE mg/dL   Nitrite NEGATIVE NEGATIVE   Leukocytes, UA NEGATIVE NEGATIVE    UA   no evidence of UTI  Lab Results  Component Value Date   HGBA1C 6.0 08/26/2008    Estimated Creatinine Clearance: 45.7 mL/min (by C-G formula based on Cr of 1.33).  BNP (last 3 results) No results for input(s): PROBNP in the last 8760 hours.  Other results:  I have pearsonaly reviewed this: ECG REPORT  Rate: 123  Rhythm: Sinus tachy ST&T Change: no ischemic changes  QTC 440   Filed Weights   04/09/15 1700  Weight: 79.379 kg (175 lb)     Cultures: No results found for: SDES, SPECREQUEST, CULT, REPTSTATUS   Radiological Exams on Admission: Dg Chest 2 View  04/09/2015  CLINICAL DATA:  Shortness of breath beginning yesterday.  Fatigue. EXAM: CHEST  2 VIEW COMPARISON:  01/23/2014 FINDINGS: The heart size and mediastinal contours are within normal limits. Both lungs are clear. No evidence of pneumothorax or pleural effusion. Mild thoracic spine degenerative changes noted. IMPRESSION: No active cardiopulmonary disease. Electronically Signed   By: Earle Gell M.D.   On: 04/09/2015 17:24   Ct Angio Chest Pe W/cm &/or Wo Cm  04/09/2015  CLINICAL DATA:  Shortness of breath for several weeks. Worse the past 2 days. Tachycardia. Hypotension. EXAM: CT ANGIOGRAPHY CHEST WITH CONTRAST TECHNIQUE: Multidetector CT imaging of the chest was performed using the standard protocol during bolus administration of intravenous contrast. Multiplanar CT image reconstructions and MIPs were obtained to evaluate the vascular anatomy. CONTRAST:  69mL OMNIPAQUE IOHEXOL 350 MG/ML SOLN COMPARISON:  Chest radiographs obtained earlier  today. Chest CTA dated 04/18/2009. FINDINGS: Mediastinum/Lymph Nodes: Central right upper lobe pulmonary arterial filling defects. The right ventricular to left ventricular ratio is 1.0. No enlarged lymph nodes. Lungs/Pleura: No pulmonary mass, infiltrate, or effusion. Upper abdomen: No acute findings. Musculoskeletal: Thoracic and upper lumbar spine degenerative changes. Review of the MIP images confirms the above findings. IMPRESSION: Central right upper lobe pulmonary emboli with CT evidence of right heart strain (RV/LV Ratio = 1.0) consistent with at least submassive (intermediate risk) PE. The presence of right heart strain has been associated with an increased risk of morbidity and mortality. Please activate Code PE by paging [phone]. Critical Value/emergent results were called by telephone at the time of interpretation on 04/09/2015 at 7:57 pm to Dr. Theotis Burrow , who verbally acknowledged these results. Electronically Signed   By: Claudie Revering M.D.   On: 04/09/2015 19:58    Chart has been reviewed  Family  at  Bedside  plan of care was discussed with   Daughters   Assessment/Plan  65 year old female with history of hypertension presents with prolonged URI symptoms and severe dehydration with hypotension down to 80s over 60s improved partially with IV fluids CT angio  for PE worrisome for RV strain discussed with [facility] M who has seen patient in consult  this point will admit to step down for close monitoring  Present on Admission:  . Pulmonary embolism ([place]) - admit to step down secondary to hypotension. Appreciate [facility] M consult, hypercoagulable panel is ordered. Once hemodynamically stable will need Cancer workup.   . TOBACCO ABUSE - spoke about importance of quitting tobacco cessation education ordered  . Essential hypertension hold blood pressure medications given hypotension  -  . Dehydration - administer IV fluids monitor and step down given severity of hypotension  . SIRS - patient  technically meets sepsis criteria although suspect hypertension small likely secondary to dehydration for now will initiate broad-spectrum antibiotics once no evidence of infection this could be discontinued  . Hypotension - most likely secondary to dehydration seems to be improving with IV fluids will monitor and step down     Prophylaxis: heparin  CODE STATUS:  FULL CODE  as per patient   Disposition:  To home once workup is complete and patient is stable  Other plan as per orders.  I have spent a total of 60 min on this admission   extra time was spent to discuss case to discuss case at length with PCCM and pulmonology  [place] 04/09/2015, 10:02 PM   [facility]  Pager [phone]   after 2 AM please page floor coverage PA If 7AM-7PM, please contact the day team taking care of the patient  [email or web address]  Password [email or web address]

## 2015-04-09 NOTE — ED Provider Notes (Signed)
CSN: [id number]     Arrival date & time 04/09/15  1615 History   First MD Initiated Contact with Patient 04/09/15 1728     Chief Complaint  Patient presents with  . Generalized Body Aches  . Fatigue     (Consider location/radiation/quality/duration/timing/severity/associated sxs/prior Treatment) HPI Comments: 65 year old female with past medical history including COPD, hypertension, GERD, hyperthyroidism who presents with shortness of breath. Patient states that she has had flulike symptoms since the end of January including fatigue, malaise, sore throat, and decreased appetite. She was initially treated with amoxicillin and then later with a prednisone and doxycycline course. She was seen at PCP office 1 week ago where she was noted to have tachycardia and hypotension at that time. She tested negative for flu. She reports her symptoms have continued and last night she began feeling short of breath which she states has worsened throughout the day. She denies any associated chest pain. Shortness of breath is worse with exertion. She reports a measured fever a few days ago as well as sweats today. She denies any nausea, vomiting, diarrhea, or urinary symptoms. She has had a gradual onset of low to mid back pain over the past few days. No Recent changes to her medications.  The history is provided by the patient.    Past Medical History  Diagnosis Date  . Asthma   . Allergic rhinitis   . Insomnia   . Paresthesia   . GERD (gastroesophageal reflux disease)   . Diverticulosis   . Ovarian cyst   . Alopecia   . IBS (irritable bowel syndrome)   . Hypertension   . COPD (chronic obstructive pulmonary disease) ([place])   . Hyperthyroidism   . Cataract     bilateral cateracts   Past Surgical History  Procedure Laterality Date  . Tubal ligation    . Knee arthroscopy      Left  . Oophorectomy      Left  . Rotator cuff repair    . Colonoscopy     Family History  Problem Relation Age of Onset   . Hypertension    . Cancer Sister 87    colon  . Diabetes Sister   . Cancer Brother     brother - stomach ca  . Stomach cancer Brother   . Colon polyps Sister     and Brother  . Cancer Sister 30     colon  . Kidney disease Brother   . Diabetes Brother   . Diabetes Brother   . Heart disease Father   . Other Mother     sclerdermia  . Diabetes Paternal Grandfather   . Colon cancer Sister   . Esophageal cancer Neg Hx   . Rectal cancer Neg Hx    Social History  Substance Use Topics  . Smoking status: Light Tobacco Smoker -- 0.25 packs/day for 20 years    Types: Cigarettes  . Smokeless tobacco: Never Used     Comment: smoking 5-6 cigs per week 01/23/14  . Alcohol Use: No   OB History    No data available     Review of Systems 10 Systems reviewed and are negative for acute change except as noted in the HPI.    Allergies  Metronidazole and Oxycodone-acetaminophen  Home Medications   Prior to Admission medications   Medication Sig Start Date End Date Taking? Authorizing Provider  fexofenadine (ALLEGRA) 180 MG tablet Take 1 tablet (180 mg total) by mouth daily as needed for allergies. 08/08/13  Yes Lew Dawes V, MD  HYDROcodone-acetaminophen (NORCO/VICODIN) 5-325 MG tablet Take 1 tablet by mouth every 8 (eight) hours as needed for severe pain. 01/14/15 12/26/16 Yes Aleksei [name] V, MD  levothyroxine (SYNTHROID, LEVOTHROID) 75 MCG tablet Take 1 tablet (75 mcg total) by mouth daily. 07/31/14  Yes [name], MD  losartan (COZAAR) 100 MG tablet Take 100 mg by mouth daily.  02/26/15  Yes Historical Provider, MD  NON FORMULARY Allergies vaccines 1:10 weekly [place]   Yes Historical Provider, MD  Polyethyl Glycol-Propyl Glycol (SYSTANE OP) Place 1 drop into both eyes daily as needed. For dry eyes   Yes Historical Provider, MD  promethazine-codeine (PHENERGAN WITH CODEINE) 6.25-10 MG/5ML syrup take 5 milliliters by mouth every 6 hours if needed for cough 04/01/15  Yes Dorothyann Peng, NP  doxycycline (VIBRAMYCIN) 100 MG capsule Take 1 capsule (100 mg total) by mouth 2 (two) times daily. Patient not taking: Reported on 04/09/2015 04/01/15   Dorothyann Peng, NP  fluconazole (DIFLUCAN) 150 MG tablet Take 1 tablet (150 mg total) by mouth once. Patient not taking: Reported on 04/01/2015 08/14/14   Lew Dawes V, MD  hydrochlorothiazide (HYDRODIURIL) 25 MG tablet Take 1 tablet (25 mg total) by mouth daily. Patient not taking: Reported on 04/09/2015 03/13/15   Hendricks Limes, MD  loratadine (CLARITIN) 10 MG tablet Take 1 tablet (10 mg total) by mouth daily. Patient not taking: Reported on 04/09/2015 08/14/14   Cassandria Anger, MD  methylPREDNISolone (MEDROL DOSEPAK) 4 MG TBPK tablet Take as directed Patient not taking: Reported on 04/09/2015 04/01/15   Dorothyann Peng, NP  Selenium 100 MCG TABS 1 po bid for your eye condition Patient not taking: Reported on 04/09/2015 11/09/13   Lew Dawes V, MD  triamcinolone cream (KENALOG) 0.1 % Apply 1 application topically 3 (three) times daily. Patient not taking: Reported on 04/09/2015 08/08/13   Cassandria Anger, MD   BP 99/79 mmHg  Pulse 97  Temp(Src) 97.8 F (36.6 C) (Oral)  Resp 21  Ht 5\' 5"  (1.651 m)  Wt 175 lb (79.379 kg)  BMI 29.12 kg/m2  SpO2 98% Physical Exam  Constitutional: She is oriented to person, place, and time. She appears well-developed and well-nourished. No distress.  uncomfortable  HENT:  Head: Normocephalic and atraumatic.  Mouth/Throat: No oropharyngeal exudate.  Moist mucous membranes, mild erythema of posterior oropharynx  Eyes: Conjunctivae are normal. Pupils are equal, round, and reactive to light.  Neck: Neck supple.  Cardiovascular: Regular rhythm and normal heart sounds.  Tachycardia present.   No murmur heard. Pulmonary/Chest: Effort normal.  Diminished BS b/l bases  Abdominal: Soft. Bowel sounds are normal. She exhibits no distension. There is no tenderness.  Musculoskeletal: She  exhibits no edema.  Neurological: She is alert and oriented to person, place, and time.  Fluent speech  Skin: Skin is warm and dry.  Psychiatric: She has a normal mood and affect. Judgment normal.  Nursing note and vitals reviewed.   ED Course  .Critical Care Performed by: Sharlett Iles Authorized by: Sharlett Iles Total critical care time: 60 minutes Critical care time was exclusive of separately billable procedures and treating other patients. Critical care was necessary to treat or prevent imminent or life-threatening deterioration of the following conditions: respiratory failure. Critical care was time spent personally by me on the following activities: development of treatment plan with patient or surrogate, discussions with consultants, interpretation of cardiac output measurements, evaluation of patient's response to treatment, examination of patient, obtaining  history from patient or surrogate, ordering and performing treatments and interventions, ordering and review of laboratory studies, ordering and review of radiographic studies, pulse oximetry, review of old charts and re-evaluation of patient's condition.   (including critical care time) Labs Review Labs Reviewed  BASIC METABOLIC PANEL - Abnormal; Notable for the following:    CO2 21 (*)    Glucose, Bld 121 (*)    BUN 25 (*)    Creatinine, Ser 1.33 (*)    GFR calc non Af Amer 42 (*)    GFR calc Af Amer 49 (*)    All other components within normal limits  CBC - Abnormal; Notable for the following:    WBC 10.6 (*)    RBC 5.82 (*)    Hemoglobin 18.1 (*)    HCT 52.0 (*)    All other components within normal limits  TSH - Abnormal; Notable for the following:    TSH 10.972 (*)    All other components within normal limits  I-STAT CG4 LACTIC ACID, ED - Abnormal; Notable for the following:    Lactic Acid, Venous 2.32 (*)    All other components within normal limits  URINE CULTURE  CULTURE, BLOOD (ROUTINE  X 2)  CULTURE, BLOOD (ROUTINE X 2)  T4, FREE  URINALYSIS, ROUTINE W REFLEX MICROSCOPIC (NOT AT [facility])  HEPARIN LEVEL (UNFRACTIONATED)  CBC  I-STAT TROPOININ, ED    Imaging Review Dg Chest 2 View  04/09/2015  CLINICAL DATA:  Shortness of breath beginning yesterday.  Fatigue. EXAM: CHEST  2 VIEW COMPARISON:  01/23/2014 FINDINGS: The heart size and mediastinal contours are within normal limits. Both lungs are clear. No evidence of pneumothorax or pleural effusion. Mild thoracic spine degenerative changes noted. IMPRESSION: No active cardiopulmonary disease. Electronically Signed   By: Earle Gell M.D.   On: 04/09/2015 17:24   Ct Angio Chest Pe W/cm &/or Wo Cm  04/09/2015  CLINICAL DATA:  Shortness of breath for several weeks. Worse the past 2 days. Tachycardia. Hypotension. EXAM: CT ANGIOGRAPHY CHEST WITH CONTRAST TECHNIQUE: Multidetector CT imaging of the chest was performed using the standard protocol during bolus administration of intravenous contrast. Multiplanar CT image reconstructions and MIPs were obtained to evaluate the vascular anatomy. CONTRAST:  6mL OMNIPAQUE IOHEXOL 350 MG/ML SOLN COMPARISON:  Chest radiographs obtained earlier today. Chest CTA dated 04/18/2009. FINDINGS: Mediastinum/Lymph Nodes: Central right upper lobe pulmonary arterial filling defects. The right ventricular to left ventricular ratio is 1.0. No enlarged lymph nodes. Lungs/Pleura: No pulmonary mass, infiltrate, or effusion. Upper abdomen: No acute findings. Musculoskeletal: Thoracic and upper lumbar spine degenerative changes. Review of the MIP images confirms the above findings. IMPRESSION: Central right upper lobe pulmonary emboli with CT evidence of right heart strain (RV/LV Ratio = 1.0) consistent with at least submassive (intermediate risk) PE. The presence of right heart strain has been associated with an increased risk of morbidity and mortality. Please activate Code PE by paging [phone]. Critical  Value/emergent results were called by telephone at the time of interpretation on 04/09/2015 at 7:57 pm to Dr. Theotis Burrow , who verbally acknowledged these results. Electronically Signed   By: Claudie Revering M.D.   On: 04/09/2015 19:58   I have personally reviewed and evaluated these lab results as part of my medical decision-making.   EKG Interpretation   Date/Time:  Wednesday April 09 2015 17:02:45 EST Ventricular Rate:  123 PR Interval:  138 QRS Duration: 68 QT Interval:  308 QTC Calculation: 440 R Axis:  52 Text Interpretation:  Sinus tachycardia Otherwise normal ECG tachycardia  new from previous Confirmed by Flynn Lininger MD, Azriella Mattia [phone]) on 04/09/2015  5:19:43 PM     Medications  sodium chloride 0.9 % bolus 500 mL (not administered)  piperacillin-tazobactam (ZOSYN) IVPB 3.375 g (not administered)  vancomycin (VANCOCIN) 1,500 mg in sodium chloride 0.9 % 500 mL IVPB (1,500 mg Intravenous New Bag/Given 04/09/15 1959)  vancomycin (VANCOCIN) IVPB 750 mg/150 ml premix (not administered)  heparin ADULT infusion 100 units/mL (25000 units/250 mL) (1,200 Units/hr Intravenous New Bag/Given 04/09/15 2026)  sodium chloride 0.9 % bolus 1,000 mL (1,000 mLs Intravenous New Bag/Given 04/09/15 1813)  sodium chloride 0.9 % bolus 1,000 mL (1,000 mLs Intravenous New Bag/Given 04/09/15 1813)  piperacillin-tazobactam (ZOSYN) IVPB 3.375 g (3.375 g Intravenous New Bag/Given 04/09/15 1959)  iohexol (OMNIPAQUE) 350 MG/ML injection 80 mL (80 mLs Intravenous Contrast Given 04/09/15 1924)  heparin bolus via infusion 5,000 Units (5,000 Units Intravenous Given 04/09/15 2027)  fentaNYL (SUBLIMAZE) injection 50 mcg (50 mcg Intravenous Given 04/09/15 2045)    MDM   Final diagnoses:  Other acute pulmonary embolism without acute cor pulmonale (HCC)  dehydration   Patient with several weeks of flulike symptoms with negative testing for flu, who presents with gradually worsening shortness of breath since yesterday. On  exam, she was uncomfortable but nontoxic and in no acute distress. She was afebrile. Vital signs at triage notable for tachycardia at 127, BP 90/68. On my examination, the patient was tachycardic to the 110s. Mentating her pre-daily, normal work of breathing, no significant wheezing or crackles noted. Obtained above lab work. EKG showed sinus tachycardia without ischemic changes. Gave pt IVF bolus.  Labs show AK I with creatinine 1.33, WBC 10.6, hemoconcentration with hemoglobin 18.1. Because of abnormal vital signs, obtained lactate which was 2.32. Initiated a code sepsis with blood and urine cultures, vancomycin and Zosyn.  Chest x-ray was negative. Although the patient denies any risk factors for blood clots, I am concerned about her ongoing tachycardia and hypotension as well as complaints of shortness of breath. Differential includes occult pneumonia or PE. Thus, obtained CTA of the chest. CT showed central right upper lobe PEs w/  right heart strain. Initiated heparin drip. On reexamination, the patient remains comfortable with normal work of breathing, O2 sat 99% on room air, tachycardia has improved to 98, BP stable at 99/79. Discussed w/ critical care, Dr. Corinna Lines, who recommended medicine admission to stepdown and continuing heparin therapy. Discussed w/ [facility], Dr. Roel Cluck, and pt admitted for further care.  Sharlett Iles, MD 04/10/15 [phone]

## 2015-04-09 NOTE — ED Notes (Signed)
Patient transported to CT 

## 2015-04-09 NOTE — Progress Notes (Signed)
Pharmacy Code Sepsis Protocol  Time of code sepsis page: 20:43 [x]  Antibiotics administered prior to code at [place]  Were antibiotics ordered at the time of the code sepsis page? Yes Was it required to contact the physician? [x]  Physician not contacted []  Physician contacted to order antibiotics for code sepsis []  Physician contacted to recommend changing antibiotics  Pharmacy consulted for: vanc/zosyn  Anti-infectives    Start     Dose/Rate Route Frequency Ordered Stop   04/10/15 0700  vancomycin (VANCOCIN) IVPB 750 mg/150 ml premix     750 mg 150 mL/hr over 60 Minutes Intravenous Every 12 hours 04/09/15 1842     04/10/15 0300  piperacillin-tazobactam (ZOSYN) IVPB 3.375 g     3.375 g 12.5 mL/hr over 240 Minutes Intravenous 3 times per day 04/09/15 1836     04/09/15 1845  piperacillin-tazobactam (ZOSYN) IVPB 3.375 g     3.375 g 100 mL/hr over 30 Minutes Intravenous  Once 04/09/15 1834 04/09/15 2029   04/09/15 1845  vancomycin (VANCOCIN) IVPB 1000 mg/200 mL premix  Status:  Discontinued     1,000 mg 200 mL/hr over 60 Minutes Intravenous  Once 04/09/15 1834 04/09/15 1839   04/09/15 1845  vancomycin (VANCOCIN) 1,500 mg in sodium chloride 0.9 % 500 mL IVPB     1,500 mg 250 mL/hr over 120 Minutes Intravenous  Once 04/09/15 1839          Nurse education provided: []  Minutes left to administer antibiotics to achieve 1 hour goal []  Correct order of antibiotic administration []  Antibiotic Y-site compatibilities    Joya San, PharmD Clinical Pharmacy Resident Pager # [phone] 04/09/2015 8:50 PM

## 2015-04-09 NOTE — Consult Note (Signed)
Pharmacy Antibiotic Note  Ashley Larsen is a 65 y.o. female admitted on 04/09/2015 with sepsis.  Pharmacy has been consulted for vancomycin and zosyn dosing. Renal insufficiency noted with sCr 1.33 and CrCl 38ml/min.  Plan: 1) Vancomycin 1500mg  IV x 1 then 750mg  IV q12 2) Zosyn 3.375g IV q8 (4 hour infusion) 3) Follow renal function, cultures, LOT, level if needed  Height: 5\' 5"  (165.1 cm) Weight: 175 lb (79.379 kg) IBW/kg (Calculated) : 57  Temp (24hrs), Avg:98.1 F (36.7 C), Min:97.8 F (36.6 C), Max:98.4 F (36.9 C)   Recent Labs Lab 04/09/15 1707 04/09/15 1806  WBC 10.6*  --   CREATININE 1.33*  --   LATICACIDVEN  --  2.32*    Estimated Creatinine Clearance: 45.7 mL/min (by C-G formula based on Cr of 1.33).    Allergies  Allergen Reactions  . Metronidazole     REACTION: upset stomach  . Oxycodone-Acetaminophen     Antimicrobials this admission: 2/22 Vancomycin >> 2/2 Zosyn >>  Dose adjustments this admission: n/a  Microbiology results: N/a  Thank you for allowing pharmacy to be a part of this patient's care.  Deboraha Sprang 04/09/2015 6:42 PM

## 2015-04-09 NOTE — Consult Note (Signed)
Name: Ashley Larsen MRN: [id number] DOB: [birth year]    ADMISSION DATE:  04/09/2015 CONSULTATION DATE:  2/22  REFERRING MD :  EDP  CHIEF COMPLAINT:  SOB  HISTORY OF PRESENT ILLNESS:  65 year old female, current every day smoker (5 pack year history, 5 cigarettes a day) past medical history as below, which includes asthma, hypertension, COPD, hypothyroidism. She has been a 3 week history of flulike symptoms including fever/chills, minimally productive cough, and body aches. She initially presented to her PCP with these complaints and was given amoxicillin, symptoms resolved in about 3-4 days and remained on for about 2 days. However symptoms recurred with subjective fevers, cough, body aches, and overall malaise. She again went to PCP (2/14) where was noted that she was hypotensive and tachycardic. She was prescribed doxycycline and prednisone thinking that this may be an exacerbation of her COPD. She was also advised to drink Pedialyte, Gatorade, and eat popsicles in order to combat dehydration. She was also instructed to stop her hydrochlorothiazide. She did stop that medication, however, she did not like the taste of Pedialyte or Gatorade. She reports that she gave popsicles to her grandchildren. She also continue to take losartan. Her symptoms did not resolve and starting 2/19 she developed shortness of breath and pleuritic chest pain additional symptoms. She had syncopal episodes after returning from grocery store twice. She presented to the emergency department 2/22 with these complaints and a reported 5 lb weight loss. CT angiogram was obtained and demonstrated pulmonary embolism with evidence of right ventricle strain. She was started on a heparin infusion. She is also noted to be hypotensive with systolic blood pressure dipping as low as the 70s at sometimes. She remained hypotensive despite 2 L crystalloid IVF resuscitation. There was concern that hypotension was secondary to pulmonary embolism  and pulmonary critical care medicine was consulted.  SIGNIFICANT EVENTS  2/22 admit  STUDIES:  CT angio chest 2/22 > Central right upper lobe pulmonary emboli with CT evidence of right heart strain (RV/LV Ratio = 1.0)    PAST MEDICAL HISTORY :   has a past medical history of Asthma; Allergic rhinitis; Insomnia; Paresthesia; GERD (gastroesophageal reflux disease); Diverticulosis; Ovarian cyst; Alopecia; IBS (irritable bowel syndrome); Hypertension; COPD (chronic obstructive pulmonary disease) ([place]); Hyperthyroidism; and Cataract.  has past surgical history that includes Tubal ligation; Knee arthroscopy; Oophorectomy; Rotator cuff repair; and Colonoscopy. Prior to Admission medications   Medication Sig Start Date End Date Taking? Authorizing Provider  fexofenadine (ALLEGRA) 180 MG tablet Take 1 tablet (180 mg total) by mouth daily as needed for allergies. 08/08/13  Yes Aleksei [name] V, MD  HYDROcodone-acetaminophen (NORCO/VICODIN) 5-325 MG tablet Take 1 tablet by mouth every 8 (eight) hours as needed for severe pain. 01/14/15 12/26/16 Yes Aleksei [name] V, MD  levothyroxine (SYNTHROID, LEVOTHROID) 75 MCG tablet Take 1 tablet (75 mcg total) by mouth daily. 07/31/14  Yes [name], MD  losartan (COZAAR) 100 MG tablet Take 100 mg by mouth daily.  02/26/15  Yes Historical Provider, MD  NON FORMULARY Allergies vaccines 1:10 weekly [place]   Yes Historical Provider, MD  Polyethyl Glycol-Propyl Glycol (SYSTANE OP) Place 1 drop into both eyes daily as needed. For dry eyes   Yes Historical Provider, MD  promethazine-codeine (PHENERGAN WITH CODEINE) 6.25-10 MG/5ML syrup take 5 milliliters by mouth every 6 hours if needed for cough 04/01/15  Yes Dorothyann Peng, NP  doxycycline (VIBRAMYCIN) 100 MG capsule Take 1 capsule (100 mg total) by mouth 2 (two) times daily. Patient not taking:  Reported on 04/09/2015 04/01/15   Dorothyann Peng, NP  fluconazole (DIFLUCAN) 150 MG tablet Take 1 tablet (150 mg total) by mouth  once. Patient not taking: Reported on 04/01/2015 08/14/14   Lew Dawes V, MD  hydrochlorothiazide (HYDRODIURIL) 25 MG tablet Take 1 tablet (25 mg total) by mouth daily. Patient not taking: Reported on 04/09/2015 03/13/15   Hendricks Limes, MD  loratadine (CLARITIN) 10 MG tablet Take 1 tablet (10 mg total) by mouth daily. Patient not taking: Reported on 04/09/2015 08/14/14   Cassandria Anger, MD  methylPREDNISolone (MEDROL DOSEPAK) 4 MG TBPK tablet Take as directed Patient not taking: Reported on 04/09/2015 04/01/15   Dorothyann Peng, NP  Selenium 100 MCG TABS 1 po bid for your eye condition Patient not taking: Reported on 04/09/2015 11/09/13   Lew Dawes V, MD  triamcinolone cream (KENALOG) 0.1 % Apply 1 application topically 3 (three) times daily. Patient not taking: Reported on 04/09/2015 08/08/13   Cassandria Anger, MD   Allergies  Allergen Reactions  . Metronidazole     REACTION: upset stomach  . Oxycodone-Acetaminophen     FAMILY HISTORY:  family history includes Cancer in her brother; Cancer (age of onset: 39) in her sister; Cancer (age of onset: 54) in her sister; Colon cancer in her sister; Colon polyps in her sister; Diabetes in her brother, brother, paternal grandfather, and sister; Heart disease in her father; Kidney disease in her brother; Other in her mother; Stomach cancer in her brother. There is no history of Esophageal cancer or Rectal cancer. SOCIAL HISTORY:  reports that she has been smoking Cigarettes.  She has a 5 pack-year smoking history. She has never used smokeless tobacco. She reports that she does not drink alcohol or use illicit drugs.  REVIEW OF SYSTEMS:   Bolds are positive  Constitutional: weight loss, gain, night sweats, Fevers, chills, fatigue.  HEENT: headaches, Sore throat, sneezing, nasal congestion, post nasal drip, Difficulty swallowing, Tooth/dental problems, visual complaints visual changes, ear ache CV:  chest pain, radiates: ,Orthopnea,  PND, swelling in lower extremities, dizziness, palpitations, syncope.  GI  heartburn, indigestion, abdominal pain, nausea, vomiting, diarrhea, change in bowel habits, loss of appetite, bloody stools.  Resp: cough, productive: , hemoptysis, dyspnea, chest pain, pleuritic.  Skin: rash or itching or icterus GU: dysuria, change in color of urine, urgency or frequency. flank pain, hematuria  MS: joint pain or swelling. decreased range of motion  Psych: change in mood or affect. depression or anxiety.  Neuro: difficulty with speech, weakness, numbness, ataxia    SUBJECTIVE:   VITAL SIGNS: Temp:  [97.8 F (36.6 C)-98.4 F (36.9 C)] 97.8 F (36.6 C) (02/22 1732) Pulse Rate:  [61-128] 87 (02/22 2130) Resp:  [16-21] 16 (02/22 2130) BP: (77-102)/(46-79) 102/69 mmHg (02/22 2130) SpO2:  [93 %-100 %] 98 % (02/22 2130) Weight:  [79.379 kg (175 lb)] 79.379 kg (175 lb) (02/22 1700)  PHYSICAL EXAMINATION: General:  Obese female in NAD Neuro:  Alert, oriented, non-focal HEENT:  [place]/AT, PERRL, no JVD Cardiovascular:  Tachy, regular Lungs:  Clear bilateral breath sounds. Non-labored on RA. Sats 100% Abdomen:  Soft, non-tender, non-distended Musculoskeletal:  No acute deformity or ROM limitation Skin:  Grossly intact   Recent Labs Lab 04/09/15 1707  NA 141  K 3.6  CL 109  CO2 21*  BUN 25*  CREATININE 1.33*  GLUCOSE 121*    Recent Labs Lab 04/09/15 1707  HGB 18.1*  HCT 52.0*  WBC 10.6*  PLT 286   Dg  Chest 2 View  04/09/2015  CLINICAL DATA:  Shortness of breath beginning yesterday.  Fatigue. EXAM: CHEST  2 VIEW COMPARISON:  01/23/2014 FINDINGS: The heart size and mediastinal contours are within normal limits. Both lungs are clear. No evidence of pneumothorax or pleural effusion. Mild thoracic spine degenerative changes noted. IMPRESSION: No active cardiopulmonary disease. Electronically Signed   By: Earle Gell M.D.   On: 04/09/2015 17:24   Ct Angio Chest Pe W/cm &/or Wo  Cm  04/09/2015  CLINICAL DATA:  Shortness of breath for several weeks. Worse the past 2 days. Tachycardia. Hypotension. EXAM: CT ANGIOGRAPHY CHEST WITH CONTRAST TECHNIQUE: Multidetector CT imaging of the chest was performed using the standard protocol during bolus administration of intravenous contrast. Multiplanar CT image reconstructions and MIPs were obtained to evaluate the vascular anatomy. CONTRAST:  38mL OMNIPAQUE IOHEXOL 350 MG/ML SOLN COMPARISON:  Chest radiographs obtained earlier today. Chest CTA dated 04/18/2009. FINDINGS: Mediastinum/Lymph Nodes: Central right upper lobe pulmonary arterial filling defects. The right ventricular to left ventricular ratio is 1.0. No enlarged lymph nodes. Lungs/Pleura: No pulmonary mass, infiltrate, or effusion. Upper abdomen: No acute findings. Musculoskeletal: Thoracic and upper lumbar spine degenerative changes. Review of the MIP images confirms the above findings. IMPRESSION: Central right upper lobe pulmonary emboli with CT evidence of right heart strain (RV/LV Ratio = 1.0) consistent with at least submassive (intermediate risk) PE. The presence of right heart strain has been associated with an increased risk of morbidity and mortality. Please activate Code PE by paging [phone]. Critical Value/emergent results were called by telephone at the time of interpretation on 04/09/2015 at 7:57 pm to Dr. Theotis Burrow , who verbally acknowledged these results. Electronically Signed   By: Claudie Revering M.D.   On: 04/09/2015 19:58    ASSESSMENT / PLAN:  Right upper lobe pulmonary embolism: (LV/RV ratio = 1 on CT) concern R heart strain. Suspect this is provoked by relative sedentary lifestyle over the last few weeks, particularly over the past few days. Point of care ultrasound demonstrates normal shape of RV/LV septum in the parasternal short axis view. Doubt any significant degree of RV strain. Hypotension improving with volume, doubt hypotension is related to PE.  Dehydration more likely given her history and hemoconcentration noted on CBC.  COPD without acute exacerbation Tobacco abuse disorder P: - Heparin IV per pharmacy - Warfarin vs NOAC starting tomorrow 2/22 per primary - Obtain formal TTE - Venous doppler studies of bilateral lower extremities to assess for DVT - Defer hypercoagulable workup as this is her first VTE episode and somewhat explained with recent sedentary lifestyle and smoking history - Appropriate for stepdown admit under hospitalist care  Hypotension: suspect this is secondary to dehydration. She has had flu like symptoms for about 3 weeks now and has had poor PO intake particularly over the past week or so. BP was soft at primary care visit about one week ago. She has a small degree of acute renal failure of BMP and hemoconcentration on CBC. Suspect this further demonstrates dehydration. BP improving after s/p 3 L in ED P: - Aggressive IVF resuscitation - Would give up to 3 additional Liters crystalloid if needed - Repeat CBC, BMP in AM to assess H&H, renal function - hold home antihypertensives  Georgann Housekeeper, AGACNP-BC [facility] Pulmonology/Critical Care Pager [phone] or [phone]  04/09/2015 10:49 PM  PCCM Attending Note: Patient seen with nurse practitioner. Please refer to his consult note which I reviewed in detail. Patient reports roughly 3 weeks of  decreased by mouth intake. Stop diuretic on last Tuesday. Inciting event seems to be viral prodrome with sore throat and myalgias. Patient reports pleuritic type chest pain over the last 3 days or so. No prior DVT. Has largely been immobile recently however. No syncope but has had some orthostatic near-syncope. No subjective fever, chills, or sweats. Blood pressure improving in the emergency department after fluid resuscitation.  BP 91/60 mmHg  Pulse 85  Temp(Src) 97.8 F (36.6 C) (Oral)  Resp 23  Ht 5\' 5"  (1.651 m)  Wt 175 lb (79.379 kg)  BMI 29.12 kg/m2   SpO2 99% Gen.: No distress. Awake. Alert.  Integument: Warm and dry. No rash on exposed skin. Pulmonary: Slightly decreased aeration bilateral lung bases. Normal work of breathing on room air. Symmetric chest wall rise. Speaking complete sentences. Cardiovascular: Regular rate and rhythm. No appreciable JVD. No edema.  CBC Latest Ref Rng 04/09/2015 01/31/2013 01/19/2012  WBC 4.0 - 10.5 K/uL 10.6(H) 3.6(L) 4.4(L)  Hemoglobin 12.0 - 15.0 g/dL 18.1(H) 10.9(L) 12.8  Hematocrit 36.0 - 46.0 % 52.0(H) 32.3(L) 38.2  Platelets 150 - 400 K/uL 286 185.0 242.0    BMP Latest Ref Rng 04/09/2015 01/27/2015 07/24/2014  Glucose 65 - 99 mg/dL 121(H) 62(L) 92  BUN 6 - 20 mg/dL 25(H) 13 17  Creatinine 0.44 - 1.00 mg/dL 1.33(H) 0.82 0.88  Sodium 135 - 145 mmol/L 141 141 138  Potassium 3.5 - 5.1 mmol/L 3.6 3.3(L) 3.0(L)  Chloride 101 - 111 mmol/L 109 106 100  CO2 22 - 32 mmol/L 21(L) 30 33(H)  Calcium 8.9 - 10.3 mg/dL 8.9 9.4 9.53    A/P: 65 year old female presenting with hypotension likely due to hypovolemia. Doubt right heart strain given bedside echocardiogram shows normal septal motion with no evidence for IVC distention. Cardiac biomarkers negative. Patient mentating well. Responding to fluid resuscitation which I feel will also help with her acute renal failure. Given her continued clinical improvement and stability I feel she can safely be admitted to the stepdown unit under hospitalist service.  1. Hypotension: Likely secondary to hypovolemia. Recommend continuing fluid resuscitation. 2. Acute PE: Recommend systemic anticoagulation with heparin given acute renal failure. Defer to primary service on further workup for hypercoagulable state as patient had only one miscarriage and family members with DVT had these after surgeries and hospitalization. 3. Acute renal failure: Likely secondary to prerenal state and hypotension. Recommend continuing to monitor urine output & renal function.   Sonia Baller Ashok Cordia,  M.D.  Pulmonary & Critical Care Pager:  [phone] After 3pm or if no response, call [phone]

## 2015-04-09 NOTE — ED Notes (Signed)
Report Attempted x1 

## 2015-04-09 NOTE — ED Notes (Signed)
Pt from home for eval of sob that started last night, pt states also has been fatigued and not feeling well. Was tested for flu recently and was told no flu. Pt denies any n/v/d at this time. Pt noted to be tachy-130 in triage.

## 2015-04-10 ENCOUNTER — Ambulatory Visit: Payer: BLUE CROSS/BLUE SHIELD

## 2015-04-10 ENCOUNTER — Inpatient Hospital Stay (HOSPITAL_COMMUNITY): Payer: BLUE CROSS/BLUE SHIELD

## 2015-04-10 DIAGNOSIS — E860 Dehydration: Secondary | ICD-10-CM

## 2015-04-10 DIAGNOSIS — I2699 Other pulmonary embolism without acute cor pulmonale: Secondary | ICD-10-CM

## 2015-04-10 DIAGNOSIS — I10 Essential (primary) hypertension: Secondary | ICD-10-CM

## 2015-04-10 LAB — COMPREHENSIVE METABOLIC PANEL
ALBUMIN: 2.4 g/dL — AB (ref 3.5–5.0)
ALT: 14 U/L (ref 14–54)
ANION GAP: 8 (ref 5–15)
AST: 14 U/L — ABNORMAL LOW (ref 15–41)
Alkaline Phosphatase: 40 U/L (ref 38–126)
BUN: 20 mg/dL (ref 6–20)
CALCIUM: 7.3 mg/dL — AB (ref 8.9–10.3)
CHLORIDE: 116 mmol/L — AB (ref 101–111)
CO2: 20 mmol/L — AB (ref 22–32)
Creatinine, Ser: 1.15 mg/dL — ABNORMAL HIGH (ref 0.44–1.00)
GFR calc non Af Amer: 50 mL/min — ABNORMAL LOW (ref >60)
GFR, EST AFRICAN AMERICAN: 58 mL/min — AB (ref >60)
Glucose, Bld: 121 mg/dL — ABNORMAL HIGH (ref 65–99)
POTASSIUM: 3.5 mmol/L (ref 3.5–5.1)
SODIUM: 144 mmol/L (ref 135–145)
Total Bilirubin: 0.4 mg/dL (ref 0.3–1.2)
Total Protein: 4.5 g/dL — ABNORMAL LOW (ref 6.5–8.1)

## 2015-04-10 LAB — TROPONIN I: Troponin I: <0.03 ng/mL (ref <0.031)

## 2015-04-10 LAB — CBC
HCT: 41.9 % (ref 36.0–46.0)
HEMOGLOBIN: 14.2 g/dL (ref 12.0–15.0)
MCH: 29.5 pg (ref 26.0–34.0)
MCHC: 33.9 g/dL (ref 30.0–36.0)
MCV: 86.9 fL (ref 78.0–100.0)
Platelets: 242 10*3/uL (ref 150–400)
RBC: 4.82 MIL/uL (ref 3.87–5.11)
RDW: 13.9 % (ref 11.5–15.5)
WBC: 11.2 10*3/uL — AB (ref 4.0–10.5)

## 2015-04-10 LAB — HEPARIN LEVEL (UNFRACTIONATED)
HEPARIN UNFRACTIONATED: 0.97 [IU]/mL — AB (ref 0.30–0.70)
HEPARIN UNFRACTIONATED: 0.78 [IU]/mL — AB (ref 0.30–0.70)
HEPARIN UNFRACTIONATED: 0.47 [IU]/mL (ref 0.30–0.70)

## 2015-04-10 LAB — PHOSPHORUS: PHOSPHORUS: 3.2 mg/dL (ref 2.5–4.6)

## 2015-04-10 LAB — TSH: TSH: 4.254 u[IU]/mL (ref 0.350–4.500)

## 2015-04-10 LAB — LACTIC ACID, PLASMA: LACTIC ACID, VENOUS: 1.4 mmol/L (ref 0.5–2.0)

## 2015-04-10 LAB — MAGNESIUM: MAGNESIUM: 1.6 mg/dL — AB (ref 1.7–2.4)

## 2015-04-10 LAB — MRSA PCR SCREENING: MRSA BY PCR: NEGATIVE

## 2015-04-10 LAB — ANTITHROMBIN III: AntiThromb III Func: 80 % (ref 75–120)

## 2015-04-10 MED ORDER — SODIUM CHLORIDE 0.9 % IV SOLN
INTRAVENOUS | Status: DC
  Administered 2015-04-10: 18:00:00 via INTRAVENOUS

## 2015-04-10 MED ORDER — SODIUM CHLORIDE 0.9 % IV BOLUS (SEPSIS)
500.0000 mL | Freq: Once | INTRAVENOUS | Status: AC
  Administered 2015-04-10: 500 mL via INTRAVENOUS

## 2015-04-10 NOTE — Progress Notes (Signed)
Pt continues to have low BPs.  Last BP 84/55.  Lamar Blinks NP on call notified and order given for 500 bolus.  Will give and continue to monitor blood pressure.

## 2015-04-10 NOTE — Consult Note (Addendum)
ANTICOAGULATION CONSULT NOTE - Follow-up Consult  Pharmacy Consult for heparin Indication: pulmonary embolus  Allergies  Allergen Reactions  . Metronidazole     REACTION: upset stomach  . Oxycodone-Acetaminophen     Pt tolerates Vicodin    Patient Measurements: Height: 5\' 5"  (165.1 cm) Weight: 175 lb (79.379 kg) IBW/kg (Calculated) : 57 Heparin Dosing Weight: 73.7 kg  Vital Signs: Temp: 97.9 F (36.6 C) (02/23 1412) Temp Source: Oral (02/23 1412) BP: 80/49 mmHg (02/23 1600) Pulse Rate: 81 (02/23 1600)  Labs:  Recent Labs  04/09/15 1707 04/10/15 0008 04/10/15 0233 04/10/15 0641 04/10/15 1033 04/10/15 1305 04/10/15 1756  HGB 18.1*  --  14.2  --   --   --   --   HCT 52.0*  --  41.9  --   --   --   --   PLT 286  --  242  --   --   --   --   HEPARINUNFRC  --   --  0.97*  --  0.78*  --  0.47  CREATININE 1.33*  --  1.15*  --   --   --   --   TROPONINI  --  <0.03  --  <0.03  --  <0.03  --     Estimated Creatinine Clearance: 52.8 mL/min (by C-G formula based on Cr of 1.15).  Assessment: 65 yo female on heparin for PE w/ right heart strain (RV/LV ratio = 1). Heparin level therapeutic on 950 units/hr.   Goal of Therapy:  Heparin level 0.3-0.7 units/ml (aim for upper end of goal 0.5-0.7 with PE and R heart strain) Monitor platelets by anticoagulation protocol: Yes   Plan:  -Continue heparin at 950 units/hr -Recheck in 6 hours to confirm -Heparin level daily wth CBC daily -Will follow plans for oral anticoagulation  Sloan Leiter, PharmD, BCPS Clinical Pharmacist [phone]  04/10/2015 7:07 PM

## 2015-04-10 NOTE — Consult Note (Signed)
ANTICOAGULATION CONSULT NOTE - Follow-up Consult  Pharmacy Consult for heparin Indication: pulmonary embolus  Allergies  Allergen Reactions  . Metronidazole     REACTION: upset stomach  . Oxycodone-Acetaminophen     Pt tolerates Vicodin    Patient Measurements: Height: 5\' 5"  (165.1 cm) Weight: 175 lb (79.379 kg) IBW/kg (Calculated) : 57 Heparin Dosing Weight: 73.7 kg  Vital Signs: Temp: 97.8 F (36.6 C) (02/23 0331) Temp Source: Oral (02/23 0331) BP: 82/51 mmHg (02/23 0331) Pulse Rate: 83 (02/23 0331)  Labs:  Recent Labs  04/09/15 1707 04/10/15 0008 04/10/15 0233  HGB 18.1*  --  14.2  HCT 52.0*  --  41.9  PLT 286  --  242  HEPARINUNFRC  --   --  0.97*  CREATININE 1.33*  --   --   TROPONINI  --  <0.03  --     Estimated Creatinine Clearance: 45.7 mL/min (by C-G formula based on Cr of 1.33).  Assessment: 65 yo female on heparin for PE w/ right heart strain (RV/LV ratio = 1). Heparin level 0.97 (supratherapeutic) on 1200 units/hr. No bleeding noted.  Goal of Therapy:  Heparin level 0.3-0.7 units/ml (aim for upper end of goal 0.5-0.7 with PE and R heart strain) Monitor platelets by anticoagulation protocol: Yes   Plan:  Decrease heparin to 1050 units/hr. Will f/u 6 hr heparin level  Sherlon Handing, PharmD, BCPS Clinical pharmacist, pager [phone] 04/10/2015,3:42 AM

## 2015-04-10 NOTE — Progress Notes (Signed)
*  PRELIMINARY RESULTS* Vascular Ultrasound Lower extremity venous duplex has been completed.  Preliminary findings: No evidence of DVT or baker's cyst.   Landry Mellow, RDMS, RVT  04/10/2015, 4:55 PM

## 2015-04-10 NOTE — Progress Notes (Addendum)
[facility] PROGRESS NOTE  Ashley Larsen [id number] DOB: 07/10/1950 DOA: 04/09/2015 PCP: Walker Kehr, MD  Assessment/Plan: 1. Pulmonary embolism- patient with new onset PE, started on heparin per pharmacy consultation. BCC him consulted for hypotension and possibility of right heart strain on CT chest. Discussed anticoagulation with Coumadin versus xaerlto with patient. She wants to think over and let us know. Hypercoagulable workup is pending 2. Hypotension- patient continues to be hypotensive, secondary to PE. No signs and symptoms of sepsis. She was empirically started on Zosyn.Will discontinue IV antibiotics. Continue IV fluids. 3. History of hypertension- patient's blood pressure medications currently on hold due to above.  Code Status: Full code Family Communication: *No family present at bedside Disposition Plan: Pending stabilization from new onset PE   Consultants:  CCM  Procedures:  None  Antibiotics: None  HPI/Subjective: 65 y.o. female presented with 2 day history of sudden onset of shortness of breath left shoulder pain worsened taking deep breaths severe fatigue feeling of presyncope. She denies any long trips has no history of malignancy no personal history of PE or blood clots. Has family history of multiple onset of blood clots. Has history of miscarriage 1. Patient found to have pulmonary embolism, started on heparin per pharmacy consultation. Patient continues to have coughing and shortness of breath this morning.  Objective: Filed Vitals:   04/10/15 1155 04/10/15 1200  BP:  87/58  Pulse:  80  Temp: 97.8 F (36.6 C)   Resp:      Intake/Output Summary (Last 24 hours) at 04/10/15 1322 Last data filed at 04/10/15 1036  Gross per 24 hour  Intake   2335 ml  Output      0 ml  Net   2335 ml   Filed Weights   04/09/15 1700  Weight: 79.379 kg (175 lb)    Exam:   General:  Appears in no acute distress  Cardiovascular: S1-S2  regular  Respiratory: Clear to auscultation bilaterally  Abdomen: Soft, nontender, no organomegaly  Musculoskeletal: No cyanosis, clubbing, edema of the lower extremities  Data Reviewed: Basic Metabolic Panel:  Recent Labs Lab 04/09/15 1707 04/10/15 0233  NA 141 144  K 3.6 3.5  CL 109 116*  CO2 21* 20*  GLUCOSE 121* 121*  BUN 25* 20  CREATININE 1.33* 1.15*  CALCIUM 8.9 7.3*  MG  --  1.6*  PHOS  --  3.2   Liver Function Tests:  Recent Labs Lab 04/10/15 0233  AST 14*  ALT 14  ALKPHOS 40  BILITOT 0.4  PROT 4.5*  ALBUMIN 2.4*   No results for input(s): LIPASE, AMYLASE in the last 168 hours. No results for input(s): AMMONIA in the last 168 hours. CBC:  Recent Labs Lab 04/09/15 1707 04/10/15 0233  WBC 10.6* 11.2*  HGB 18.1* 14.2  HCT 52.0* 41.9  MCV 89.3 86.9  PLT 286 242   Cardiac Enzymes:  Recent Labs Lab 04/10/15 0008 04/10/15 0641  TROPONINI <0.03 <0.03   BNP (last 3 results)  Recent Labs  04/09/15 1707  BNP 37.5    ProBNP (last 3 results) No results for input(s): PROBNP in the last 8760 hours.  CBG: No results for input(s): GLUCAP in the last 168 hours.  Recent Results (from the past 240 hour(s))  Urine culture     Status: None (Preliminary result)   Collection Time: 04/09/15  8:19 PM  Result Value Ref Range Status   Specimen Description URINE, CLEAN CATCH  Final   Special Requests Normal  Final  Culture NO GROWTH < 24 HOURS  Final   Report Status PENDING  Incomplete  MRSA PCR Screening     Status: None   Collection Time: 04/10/15  3:00 AM  Result Value Ref Range Status   MRSA by PCR NEGATIVE NEGATIVE Final    Comment:        The GeneXpert MRSA Assay (FDA approved for NASAL specimens only), is one component of a comprehensive MRSA colonization surveillance program. It is not intended to diagnose MRSA infection nor to guide or monitor treatment for MRSA infections.      Studies: Dg Chest 2 View  04/09/2015  CLINICAL  DATA:  Shortness of breath beginning yesterday.  Fatigue. EXAM: CHEST  2 VIEW COMPARISON:  01/23/2014 FINDINGS: The heart size and mediastinal contours are within normal limits. Both lungs are clear. No evidence of pneumothorax or pleural effusion. Mild thoracic spine degenerative changes noted. IMPRESSION: No active cardiopulmonary disease. Electronically Signed   By: Earle Gell M.D.   On: 04/09/2015 17:24   Ct Angio Chest Pe W/cm &/or Wo Cm  04/09/2015  CLINICAL DATA:  Shortness of breath for several weeks. Worse the past 2 days. Tachycardia. Hypotension. EXAM: CT ANGIOGRAPHY CHEST WITH CONTRAST TECHNIQUE: Multidetector CT imaging of the chest was performed using the standard protocol during bolus administration of intravenous contrast. Multiplanar CT image reconstructions and MIPs were obtained to evaluate the vascular anatomy. CONTRAST:  54mL OMNIPAQUE IOHEXOL 350 MG/ML SOLN COMPARISON:  Chest radiographs obtained earlier today. Chest CTA dated 04/18/2009. FINDINGS: Mediastinum/Lymph Nodes: Central right upper lobe pulmonary arterial filling defects. The right ventricular to left ventricular ratio is 1.0. No enlarged lymph nodes. Lungs/Pleura: No pulmonary mass, infiltrate, or effusion. Upper abdomen: No acute findings. Musculoskeletal: Thoracic and upper lumbar spine degenerative changes. Review of the MIP images confirms the above findings. IMPRESSION: Central right upper lobe pulmonary emboli with CT evidence of right heart strain (RV/LV Ratio = 1.0) consistent with at least submassive (intermediate risk) PE. The presence of right heart strain has been associated with an increased risk of morbidity and mortality. Please activate Code PE by paging [phone]. Critical Value/emergent results were called by telephone at the time of interpretation on 04/09/2015 at 7:57 pm to Dr. Theotis Burrow , who verbally acknowledged these results. Electronically Signed   By: Claudie Revering M.D.   On: 04/09/2015 19:58     Scheduled Meds: . docusate sodium  100 mg Oral BID  . levothyroxine  75 mcg Oral QAC breakfast  . piperacillin-tazobactam (ZOSYN)  IV  3.375 g Intravenous 3 times per day  . sodium chloride flush  3 mL Intravenous Q12H  . vancomycin  750 mg Intravenous Q12H   Continuous Infusions: . heparin 950 Units/hr (04/10/15 1200)    Active Problems:   TOBACCO ABUSE   Essential hypertension   Dehydration   Pulmonary emboli (HCC)   Sepsis (HCC)   Hypotension   Pulmonary embolism ([place])    Time spent: 25 min    [facility]  [facility] Pager [phone]*. If 7PM-7AM, please contact night-coverage at [email or web address], password [facility] 04/10/2015, 1:22 PM  LOS: 1 day           Of min

## 2015-04-10 NOTE — Care Management Note (Addendum)
Case Management Note  Patient Details  Name: Ashley Larsen MRN: [id number] Date of Birth: Nov 22, 1950  Subjective/Objective:     Date:04/10/15 Spoke with patient at the bedside along with her sister who lives in [place] . Introduced self as Tourist information centre manager and explained role in discharge planning and how to be reached.  Verified patient lives in town, alone, PTA Indep. Expressed potential need for no DME.  Verified patient anticipates to go home alone, her sister will be able to ast her at home  at time of discharge if needed. Patient denied needing help with their medication.  Patient drives but will be  driven by family after dc to MD appointments.  Verified patient has PCP [name].   Plan: CM will continue to follow for discharge planning and [facility] resources.                Action/Plan: PE on heparin gtt, will need to start anticoagulation xarelto vs coumadin, patient undecided. Hypotensive, cont ivf's.    Expected Discharge Date:  04/18/15               Expected Discharge Plan:  Home/Self Care  In-House Referral:     Discharge planning Services  CM Consult  Post Acute Care Choice:    Choice offered to:     DME Arranged:    DME Agency:     HH Arranged:    HH Agency:     Status of Service:  In process, will continue to follow  Medicare Important Message Given:    Date Medicare IM Given:    Medicare IM give by:    Date Additional Medicare IM Given:    Additional Medicare Important Message give by:     If discussed at [place] of Stay Meetings, dates discussed:    Additional Comments:  [name], RN 04/10/2015, 2:36 PM

## 2015-04-10 NOTE — Consult Note (Addendum)
ANTICOAGULATION CONSULT NOTE - Follow-up Consult  Pharmacy Consult for heparin Indication: pulmonary embolus  Allergies  Allergen Reactions  . Metronidazole     REACTION: upset stomach  . Oxycodone-Acetaminophen     Pt tolerates Vicodin    Patient Measurements: Height: 5\' 5"  (165.1 cm) Weight: 175 lb (79.379 kg) IBW/kg (Calculated) : 57 Heparin Dosing Weight: 73.7 kg  Vital Signs: Temp: 97.6 F (36.4 C) (02/23 0700) Temp Source: Oral (02/23 0331) BP: 90/55 mmHg (02/23 0800) Pulse Rate: 80 (02/23 0600)  Labs:  Recent Labs  04/09/15 1707 04/10/15 0008 04/10/15 0233 04/10/15 0641 04/10/15 1033  HGB 18.1*  --  14.2  --   --   HCT 52.0*  --  41.9  --   --   PLT 286  --  242  --   --   HEPARINUNFRC  --   --  0.97*  --  0.78*  CREATININE 1.33*  --  1.15*  --   --   TROPONINI  --  <0.03  --  <0.03  --     Estimated Creatinine Clearance: 52.8 mL/min (by C-G formula based on Cr of 1.15).  Assessment: 65 yo female on heparin for PE w/ right heart strain (RV/LV ratio = 1). Heparin level 0.78 (supratherapeutic) on 1050 units/hr.   Goal of Therapy:  Heparin level 0.3-0.7 units/ml (aim for upper end of goal 0.5-0.7 with PE and R heart strain) Monitor platelets by anticoagulation protocol: Yes   Plan:  -Decrease heparin to 950 units/hr -Heparin level in 6 hours and daily wth CBC daily -Will follow plans for oral anticoagulation  Hildred Laser, Pharm D 04/10/2015 11:21 AM

## 2015-04-11 ENCOUNTER — Inpatient Hospital Stay (HOSPITAL_COMMUNITY): Payer: BLUE CROSS/BLUE SHIELD

## 2015-04-11 LAB — URINE CULTURE: SPECIAL REQUESTS: NORMAL

## 2015-04-11 LAB — CARDIOLIPIN ANTIBODIES, IGG, IGM, IGA
Anticardiolipin IgA: <9 APL U/mL (ref 0–11)
Anticardiolipin IgM: <9 MPL U/mL (ref 0–12)

## 2015-04-11 LAB — HEXAGONAL PHASE PHOSPHOLIPID: HEXAGONAL PHASE PHOSPHOLIPID: 5 s (ref 0–11)

## 2015-04-11 LAB — CBC
HEMATOCRIT: 40 % (ref 36.0–46.0)
HEMOGLOBIN: 13.8 g/dL (ref 12.0–15.0)
MCH: 30.6 pg (ref 26.0–34.0)
MCHC: 34.5 g/dL (ref 30.0–36.0)
MCV: 88.7 fL (ref 78.0–100.0)
Platelets: 201 10*3/uL (ref 150–400)
RBC: 4.51 MIL/uL (ref 3.87–5.11)
RDW: 14.6 % (ref 11.5–15.5)
WBC: 7.7 10*3/uL (ref 4.0–10.5)

## 2015-04-11 LAB — PROTEIN S ACTIVITY: PROTEIN S ACTIVITY: 52 % — AB (ref 63–140)

## 2015-04-11 LAB — PROCALCITONIN

## 2015-04-11 LAB — PTT-LA MIX: PTT-LA MIX: 42.5 s — AB (ref 0.0–40.6)

## 2015-04-11 LAB — BETA-2-GLYCOPROTEIN I ABS, IGG/M/A
Beta-2 Glyco I IgG: <9 GPI IgG units (ref 0–20)
Beta-2-Glycoprotein I IgA: <9 GPI IgA units (ref 0–25)
Beta-2-Glycoprotein I IgM: <9 GPI IgM units (ref 0–32)

## 2015-04-11 LAB — LUPUS ANTICOAGULANT PANEL
DRVVT: 48.1 s — ABNORMAL HIGH (ref 0.0–44.0)
PTT LA: 50.6 s — AB (ref 0.0–43.6)

## 2015-04-11 LAB — PROTEIN S, TOTAL: PROTEIN S AG TOTAL: 95 % (ref 60–150)

## 2015-04-11 LAB — HOMOCYSTEINE: Homocysteine: 12.1 umol/L (ref 0.0–15.0)

## 2015-04-11 LAB — DRVVT MIX: DRVVT MIX: 42 s (ref 0.0–44.0)

## 2015-04-11 LAB — PROTEIN C ACTIVITY: Protein C Activity: 97 % (ref 73–180)

## 2015-04-11 LAB — PROTEIN C, TOTAL: Protein C, Total: 70 % (ref 60–150)

## 2015-04-11 LAB — HEPARIN LEVEL (UNFRACTIONATED): Heparin Unfractionated: 0.69 IU/mL (ref 0.30–0.70)

## 2015-04-11 MED ORDER — RIVAROXABAN 20 MG PO TABS: 20.0000 mg | ORAL_TABLET | Freq: Every day | ORAL | Status: DC

## 2015-04-11 MED ORDER — RIVAROXABAN 15 MG PO TABS
15.0000 mg | ORAL_TABLET | Freq: Two times a day (BID) | ORAL | Status: DC
Start: 2015-04-11 — End: 2015-04-12
  Administered 2015-04-11 – 2015-04-12 (×3): 15 mg via ORAL
  Filled 2015-04-11 (×3): qty 1

## 2015-04-11 NOTE — Consult Note (Signed)
ANTICOAGULATION CONSULT NOTE - Follow-up Consult  Pharmacy Consult for heparin >> xarelto Indication: pulmonary embolus  Allergies  Allergen Reactions  . Metronidazole     REACTION: upset stomach  . Oxycodone-Acetaminophen     Pt tolerates Vicodin    Patient Measurements: Height: 5\' 5"  (165.1 cm) Weight: 175 lb (79.379 kg) IBW/kg (Calculated) : 57 Heparin Dosing Weight: 73.7 kg  Vital Signs: Temp: 98 F (36.7 C) (02/24 0754) Temp Source: Oral (02/24 0754) BP: 96/52 mmHg (02/24 0754) Pulse Rate: 80 (02/24 0754)  Labs:  Recent Labs  04/09/15 1707 04/10/15 0008  04/10/15 0233 04/10/15 [id number] 04/10/15 1033 04/10/15 1305 04/10/15 1756 04/11/15 0237 04/11/15 0238  HGB 18.1*  --   --  14.2  --   --   --   --   --  13.8  HCT 52.0*  --   --  41.9  --   --   --   --   --  40.0  PLT 286  --   --  242  --   --   --   --   --  201  HEPARINUNFRC  --   --   < > 0.97*  --  0.78*  --  0.47 0.69  --   CREATININE 1.33*  --   --  1.15*  --   --   --   --   --   --   TROPONINI  --  <0.03  --   --  <0.03  --  <0.03  --   --   --   < > = values in this interval not displayed.  Estimated Creatinine Clearance: 52.8 mL/min (by C-G formula based on Cr of 1.15).  Assessment: 65 yo female on heparin for PE w/ right heart strain (RV/LV ratio = 1). Heparin level therapeutic on 950 units/hr.  To transition to oral anticoagulation with Xarelto today.  CrCl 57ml/min (C-G TBW).  Goal of Therapy:  Therapeutic Anticoagulation Monitor platelets by anticoagulation protocol: Yes   Plan:  Discontinue heparin. Start Xarelto 15mg  PO BID x 21 days, then 20mg  daily with food. Start Xarelto at time of heparin discontinuation. Discontinue heparin labs.  [employer], Pharm.D., BCPS Clinical Pharmacist Pager [phone] 04/11/2015 10:22 AM

## 2015-04-11 NOTE — Care Management Note (Signed)
Case Management Note  Patient Details  Name: Ashley Larsen MRN: [id number] Date of Birth: 1950/12/21  Subjective/Objective:    Patient is indep, she will have assistance from her sister if needed.  She will be on xarelto at dc, benefit check for xareltop-   Xarelto is Tier 3/ no auth required/ $40 for30 day retail.  NCM gave patient 30 day trial savings card along with the 0 co pay for 12 months savings card. Her pharmacy [facility] on [address] has in stock.     Action/Plan:   Expected Discharge Date:  04/18/15               Expected Discharge Plan:  Home/Self Care  In-House Referral:     Discharge planning Services  CM Consult  Post Acute Care Choice:    Choice offered to:     DME Arranged:    DME Agency:     HH Arranged:    HH Agency:     Status of Service:  Completed, signed off  Medicare Important Message Given:    Date Medicare IM Given:    Medicare IM give by:    Date Additional Medicare IM Given:    Additional Medicare Important Message give by:     If discussed at [place] of Stay Meetings, dates discussed:    Additional Comments:  [name], RN 04/11/2015, 2:04 PM

## 2015-04-11 NOTE — Progress Notes (Signed)
[facility] PROGRESS NOTE  Ashley Larsen [id number] DOB: [birth year] DOA: 04/09/2015 PCP: Walker Kehr, MD  Assessment/Plan: 1. Pulmonary embolism- patient with new onset PE, started on heparin per pharmacy consultation. PCCM consulted for hypotension and possibility of right heart strain on CT chest. Discussed anticoagulation with Coumadin versus xaerlto with patient. She wants to take xaerlto as outpatient . Will start Xarelto per pharmacy consultation and discontinue heparin. Hypercoagulable workup is pending. Chest x-ray shows no acute abnormality 2. Low back pain- lumbar spine x-ray shows no significant abnormality. 3. Hypotension- patient continues to be hypotensive, secondary to PE. No signs and symptoms of sepsis. She was empirically started on Zosyn.Will discontinue IV antibiotics. Blood pressure has improved. Will change IV fluids to [facility] to prevent fluid overload. 4. History of hypertension- patient's blood pressure medications currently on hold due to above.  Code Status: Full code Family Communication: *No family present at bedside Disposition Plan: Pending stabilization from new onset PE   Consultants:  CCM  Procedures:  None  Antibiotics: None  HPI/Subjective: 65 y.o. female presented with 2 day history of sudden onset of shortness of breath left shoulder pain worsened taking deep breaths severe fatigue feeling of presyncope. She denies any long trips has no history of malignancy no personal history of PE or blood clots. Has family history of multiple onset of blood clots. Has history of miscarriage 1. Patient found to have pulmonary embolism, started on heparin per pharmacy consultation. Patient denies chest pain, has mild shortness of breath. Also complains of back pain  Objective: Filed Vitals:   04/11/15 1156 04/11/15 1431  BP: 91/59 99/61  Pulse: 80 101  Temp:  98.2 F (36.8 C)  Resp:  18    Intake/Output Summary (Last 24 hours) at 04/11/15  1438 Last data filed at 04/11/15 0605  Gross per 24 hour  Intake 2820.33 ml  Output      0 ml  Net 2820.33 ml   Filed Weights   04/09/15 1700  Weight: 79.379 kg (175 lb)    Exam:   General:  Appears in no acute distress  Cardiovascular: S1-S2 regular  Respiratory: Clear to auscultation bilaterally  Abdomen: Soft, nontender, no organomegaly  Musculoskeletal: No cyanosis, clubbing, edema of the lower extremities  Data Reviewed: Basic Metabolic Panel:  Recent Labs Lab 04/09/15 1707 04/10/15 0233  NA 141 144  K 3.6 3.5  CL 109 116*  CO2 21* 20*  GLUCOSE 121* 121*  BUN 25* 20  CREATININE 1.33* 1.15*  CALCIUM 8.9 7.3*  MG  --  1.6*  PHOS  --  3.2   Liver Function Tests:  Recent Labs Lab 04/10/15 0233  AST 14*  ALT 14  ALKPHOS 40  BILITOT 0.4  PROT 4.5*  ALBUMIN 2.4*   No results for input(s): LIPASE, AMYLASE in the last 168 hours. No results for input(s): AMMONIA in the last 168 hours. CBC:  Recent Labs Lab 04/09/15 1707 04/10/15 0233 04/11/15 0238  WBC 10.6* 11.2* 7.7  HGB 18.1* 14.2 13.8  HCT 52.0* 41.9 40.0  MCV 89.3 86.9 88.7  PLT 286 242 201   Cardiac Enzymes:  Recent Labs Lab 04/10/15 0008 04/10/15 0641 04/10/15 1305  TROPONINI <0.03 <0.03 <0.03   BNP (last 3 results)  Recent Labs  04/09/15 1707  BNP 37.5    ProBNP (last 3 results) No results for input(s): PROBNP in the last 8760 hours.  CBG: No results for input(s): GLUCAP in the last 168 hours.  Recent Results (from the past  240 hour(s))  Culture, blood (routine x 2)     Status: None (Preliminary result)   Collection Time: 04/09/15  6:35 PM  Result Value Ref Range Status   Specimen Description BLOOD RIGHT HAND  Final   Special Requests IN PEDIATRIC BOTTLE 3CC  Final   Culture NO GROWTH < 24 HOURS  Final   Report Status PENDING  Incomplete  Culture, blood (routine x 2)     Status: None (Preliminary result)   Collection Time: 04/09/15  6:40 PM  Result Value Ref  Range Status   Specimen Description BLOOD LEFT ANTECUBITAL  Final   Special Requests BOTTLES DRAWN AEROBIC AND ANAEROBIC 5CC  Final   Culture NO GROWTH < 24 HOURS  Final   Report Status PENDING  Incomplete  Urine culture     Status: None   Collection Time: 04/09/15  8:19 PM  Result Value Ref Range Status   Specimen Description URINE, CLEAN CATCH  Final   Special Requests Normal  Final   Culture MULTIPLE SPECIES PRESENT, SUGGEST RECOLLECTION  Final   Report Status 04/11/2015 FINAL  Final  MRSA PCR Screening     Status: None   Collection Time: 04/10/15  3:00 AM  Result Value Ref Range Status   MRSA by PCR NEGATIVE NEGATIVE Final    Comment:        The GeneXpert MRSA Assay (FDA approved for NASAL specimens only), is one component of a comprehensive MRSA colonization surveillance program. It is not intended to diagnose MRSA infection nor to guide or monitor treatment for MRSA infections.      Studies: Dg Chest 2 View  04/11/2015  CLINICAL DATA:  Dyspnea, lumbar pain starting Monday, no known injury EXAM: CHEST  2 VIEW COMPARISON:  04/09/2015 FINDINGS: Borderline cardiomegaly. No acute infiltrate or pleural effusion. No pulmonary edema. Mild degenerative changes thoracic spine. IMPRESSION: No active cardiopulmonary disease. Mild degenerative changes thoracic spine. Electronically Signed   By: Lahoma Crocker M.D.   On: 04/11/2015 10:00   Dg Chest 2 View  04/09/2015  CLINICAL DATA:  Shortness of breath beginning yesterday.  Fatigue. EXAM: CHEST  2 VIEW COMPARISON:  01/23/2014 FINDINGS: The heart size and mediastinal contours are within normal limits. Both lungs are clear. No evidence of pneumothorax or pleural effusion. Mild thoracic spine degenerative changes noted. IMPRESSION: No active cardiopulmonary disease. Electronically Signed   By: Earle Gell M.D.   On: 04/09/2015 17:24   Dg Lumbar Spine 2-3 Views  04/11/2015  CLINICAL DATA:  Dyspnea, lumbar pain starting Monday, no known injury  EXAM: LUMBAR SPINE - 2-3 VIEW COMPARISON:  01/31/2013 and lumbar spine 11/01/2012 FINDINGS: Three views of lumbar spine submitted. Again noted moderate anterior and mild posterior spurring lower endplate of L3 vertebral body. Stable mild anterior spurring upper endplate of L4. There is mild disc space flattening with mild anterior spurring L4-L5 level. Mild disc space flattening at L5-S1 level. No acute fracture or subluxation. Mild facet degenerative changes L4 and L5 level. Again noted right lateral endplate osteophytes at L1-L2 level. IMPRESSION: No acute fracture or subluxation. Stable degenerative changes as described above. Electronically Signed   By: Lahoma Crocker M.D.   On: 04/11/2015 10:03   Ct Angio Chest Pe W/cm &/or Wo Cm  04/09/2015  CLINICAL DATA:  Shortness of breath for several weeks. Worse the past 2 days. Tachycardia. Hypotension. EXAM: CT ANGIOGRAPHY CHEST WITH CONTRAST TECHNIQUE: Multidetector CT imaging of the chest was performed using the standard protocol during bolus administration of  intravenous contrast. Multiplanar CT image reconstructions and MIPs were obtained to evaluate the vascular anatomy. CONTRAST:  48mL OMNIPAQUE IOHEXOL 350 MG/ML SOLN COMPARISON:  Chest radiographs obtained earlier today. Chest CTA dated 04/18/2009. FINDINGS: Mediastinum/Lymph Nodes: Central right upper lobe pulmonary arterial filling defects. The right ventricular to left ventricular ratio is 1.0. No enlarged lymph nodes. Lungs/Pleura: No pulmonary mass, infiltrate, or effusion. Upper abdomen: No acute findings. Musculoskeletal: Thoracic and upper lumbar spine degenerative changes. Review of the MIP images confirms the above findings. IMPRESSION: Central right upper lobe pulmonary emboli with CT evidence of right heart strain (RV/LV Ratio = 1.0) consistent with at least submassive (intermediate risk) PE. The presence of right heart strain has been associated with an increased risk of morbidity and mortality.  Please activate Code PE by paging [phone]. Critical Value/emergent results were called by telephone at the time of interpretation on 04/09/2015 at 7:57 pm to Dr. Theotis Burrow , who verbally acknowledged these results. Electronically Signed   By: Claudie Revering M.D.   On: 04/09/2015 19:58    Scheduled Meds: . docusate sodium  100 mg Oral BID  . levothyroxine  75 mcg Oral QAC breakfast  . rivaroxaban  15 mg Oral BID   Followed by  . [START ON 05/02/2015] rivaroxaban  20 mg Oral Q supper  . sodium chloride flush  3 mL Intravenous Q12H   Continuous Infusions: . sodium chloride 100 mL/hr at 04/11/15 0600    Active Problems:   TOBACCO ABUSE   Essential hypertension   Dehydration   Pulmonary emboli (HCC)   Sepsis (HCC)   Hypotension   Pulmonary embolism ([place])    Time spent: 25 min    [employer] [facility]  [facility] Pager [phone]*. If 7PM-7AM, please contact night-coverage at [email or web address], password [facility] 04/11/2015, 2:38 PM  LOS: 2 days

## 2015-04-12 ENCOUNTER — Inpatient Hospital Stay (HOSPITAL_COMMUNITY): Payer: BLUE CROSS/BLUE SHIELD

## 2015-04-12 DIAGNOSIS — I2699 Other pulmonary embolism without acute cor pulmonale: Secondary | ICD-10-CM

## 2015-04-12 LAB — CBC
HEMATOCRIT: 35.5 % — AB (ref 36.0–46.0)
HEMOGLOBIN: 11.7 g/dL — AB (ref 12.0–15.0)
MCH: 28.9 pg (ref 26.0–34.0)
MCHC: 33 g/dL (ref 30.0–36.0)
MCV: 87.7 fL (ref 78.0–100.0)
Platelets: 188 10*3/uL (ref 150–400)
RBC: 4.05 MIL/uL (ref 3.87–5.11)
RDW: 14.6 % (ref 11.5–15.5)
WBC: 6 10*3/uL (ref 4.0–10.5)

## 2015-04-12 MED ORDER — HYDROCODONE-ACETAMINOPHEN 5-325 MG PO TABS: 1.0000 | ORAL_TABLET | Freq: Three times a day (TID) | ORAL | Status: DC | PRN

## 2015-04-12 MED ORDER — RIVAROXABAN 20 MG PO TABS: 20.0000 mg | ORAL_TABLET | Freq: Every day | ORAL | Status: DC

## 2015-04-12 MED ORDER — RIVAROXABAN (XARELTO) VTE STARTER PACK (15 & 20 MG): ORAL_TABLET | ORAL | Status: DC

## 2015-04-12 NOTE — Discharge Summary (Addendum)
Physician Discharge Summary  Ashley Larsen [id number] DOB: 09/20/1950 DOA: 04/09/2015  PCP: Walker Kehr, MD  Admit date: 04/09/2015 Discharge date: 04/12/2015  Time spent: *25 minutes  Recommendations for Outpatient Follow-up:  1. Follow up PCP in 2 weeks 2. PCP to follow the official echo results.  Discharge Diagnoses:  Active Problems:   TOBACCO ABUSE   Essential hypertension   Dehydration   Pulmonary emboli (HCC)   Sepsis (HCC)   Hypotension   Pulmonary embolism ([place])   Discharge Condition: Stable  Diet recommendation: Regular diet  Filed Weights   04/09/15 1700  Weight: 79.379 kg (175 lb)    History of present illness:  65 y.o. female presented with 2 day history of sudden onset of shortness of breath left shoulder pain worsened taking deep breaths severe fatigue feeling of presyncope. She denies any long trips has no history of malignancy no personal history of PE or blood clots. Has family history of multiple onset of blood clots. Has history of miscarriage 1. Patient found to have pulmonary embolism, started on heparin per pharmacy consultation.  Hospital Course:  1. Pulmonary embolism- patient with new onset PE, started on heparin per pharmacy consultation. PCCM consulted for hypotension and possibility of right heart strain on CT chest. Patient had bedside echo which showed normal septal motion with no evidence of IVC distention as per [facility] . Discussed anticoagulation with Coumadin versus xaerlto with patient. She wants to take xaerlto as outpatient . [name] Hypercoagulable workup is negative. Chest x-ray shows no acute abnormality. We'll start Xarelto as outpatient. 2. Low back pain- lumbar spine x-ray shows no significant abnormality. Continue Vicodin when necessary for pain 3. Hypotension-resolved, still blood pressure and low 100s. Will hold the patient's blood pressure medications as outpatient. 4. History of hypertension- patient's blood pressure medications  currently on hold due to above. We'll check with PCP if blood pressure starts to rise again.  Note: Echo done today, was read by cardiologist Dr Kate Sable. Called and discussed the results with him. No official results available at this time in epic.No right heart strain. PCP can follow the official results.  Procedures:  Bedside echo  Consultations:  PC CM  Discharge Exam: Filed Vitals:   04/11/15 2138 04/12/15 0712  BP: 99/57 104/57  Pulse: 97 92  Temp: 99.9 F (37.7 C) 98.1 F (36.7 C)  Resp: 20 18    General: Appears in no acute distress Cardiovascular: S1-S2 regular Respiratory: Clear to auscultation bilaterally  Discharge Instructions   Discharge Instructions    Diet - low sodium heart healthy    Complete by:  As directed      Discharge instructions    Complete by:  As directed   Check your blood pressure at home and call your PCP for further recommendations. Do not take blood pressure medications at home     Increase activity slowly    Complete by:  As directed           Current Discharge Medication List    START taking these medications   Details  Rivaroxaban (XARELTO STARTER PACK) 15 & 20 MG TBPK Take as directed on package: Start with one 15mg  tablet by mouth twice a day with food. On Day 22, switch to one 20mg  tablet once a day with food. Qty: 51 each, Refills: 0    rivaroxaban (XARELTO) 20 MG TABS tablet Take 1 tablet (20 mg total) by mouth daily with supper. Qty: 30 tablet, Refills: 3  CONTINUE these medications which have NOT CHANGED   Details  fexofenadine (ALLEGRA) 180 MG tablet Take 1 tablet (180 mg total) by mouth daily as needed for allergies. Qty: 90 tablet, Refills: 3    HYDROcodone-acetaminophen (NORCO/VICODIN) 5-325 MG tablet Take 1 tablet by mouth every 8 (eight) hours as needed for severe pain. Qty: 100 tablet, Refills: 0    levothyroxine (SYNTHROID, LEVOTHROID) 75 MCG tablet Take 1 tablet (75 mcg total) by mouth  daily. Qty: 90 tablet, Refills: 3    NON FORMULARY Allergies vaccines 1:10 weekly GH    Polyethyl Glycol-Propyl Glycol (SYSTANE OP) Place 1 drop into both eyes daily as needed. For dry eyes    promethazine-codeine (PHENERGAN WITH CODEINE) 6.25-10 MG/5ML syrup take 5 milliliters by mouth every 6 hours if needed for cough Qty: 300 mL, Refills: 0   Associated Diagnoses: Acute bronchitis, unspecified organism    loratadine (CLARITIN) 10 MG tablet Take 1 tablet (10 mg total) by mouth daily. Qty: 100 tablet, Refills: 3    Selenium 100 MCG TABS 1 po bid for your eye condition Qty: 200 each, Refills: 2    triamcinolone cream (KENALOG) 0.1 % Apply 1 application topically 3 (three) times daily. Qty: 90 g, Refills: 3      STOP taking these medications     losartan (COZAAR) 100 MG tablet      doxycycline (VIBRAMYCIN) 100 MG capsule      fluconazole (DIFLUCAN) 150 MG tablet      hydrochlorothiazide (HYDRODIURIL) 25 MG tablet        Allergies  Allergen Reactions  . Metronidazole     REACTION: upset stomach  . Oxycodone-Acetaminophen     Pt tolerates Vicodin      The results of significant diagnostics from this hospitalization (including imaging, microbiology, ancillary and laboratory) are listed below for reference.    Significant Diagnostic Studies: Dg Chest 2 View  04/11/2015  CLINICAL DATA:  Dyspnea, lumbar pain starting Monday, no known injury EXAM: CHEST  2 VIEW COMPARISON:  04/09/2015 FINDINGS: Borderline cardiomegaly. No acute infiltrate or pleural effusion. No pulmonary edema. Mild degenerative changes thoracic spine. IMPRESSION: No active cardiopulmonary disease. Mild degenerative changes thoracic spine. Electronically Signed   By: Lahoma Crocker M.D.   On: 04/11/2015 10:00   Dg Chest 2 View  04/09/2015  CLINICAL DATA:  Shortness of breath beginning yesterday.  Fatigue. EXAM: CHEST  2 VIEW COMPARISON:  01/23/2014 FINDINGS: The heart size and mediastinal contours are within  normal limits. Both lungs are clear. No evidence of pneumothorax or pleural effusion. Mild thoracic spine degenerative changes noted. IMPRESSION: No active cardiopulmonary disease. Electronically Signed   By: Earle Gell M.D.   On: 04/09/2015 17:24   Dg Lumbar Spine 2-3 Views  04/11/2015  CLINICAL DATA:  Dyspnea, lumbar pain starting Monday, no known injury EXAM: LUMBAR SPINE - 2-3 VIEW COMPARISON:  01/31/2013 and lumbar spine 11/01/2012 FINDINGS: Three views of lumbar spine submitted. Again noted moderate anterior and mild posterior spurring lower endplate of L3 vertebral body. Stable mild anterior spurring upper endplate of L4. There is mild disc space flattening with mild anterior spurring L4-L5 level. Mild disc space flattening at L5-S1 level. No acute fracture or subluxation. Mild facet degenerative changes L4 and L5 level. Again noted right lateral endplate osteophytes at L1-L2 level. IMPRESSION: No acute fracture or subluxation. Stable degenerative changes as described above. Electronically Signed   By: Lahoma Crocker M.D.   On: 04/11/2015 10:03   Ct Angio Chest Pe W/cm &/  or Wo Cm  04/09/2015  CLINICAL DATA:  Shortness of breath for several weeks. Worse the past 2 days. Tachycardia. Hypotension. EXAM: CT ANGIOGRAPHY CHEST WITH CONTRAST TECHNIQUE: Multidetector CT imaging of the chest was performed using the standard protocol during bolus administration of intravenous contrast. Multiplanar CT image reconstructions and MIPs were obtained to evaluate the vascular anatomy. CONTRAST:  59mL OMNIPAQUE IOHEXOL 350 MG/ML SOLN COMPARISON:  Chest radiographs obtained earlier today. Chest CTA dated 04/18/2009. FINDINGS: Mediastinum/Lymph Nodes: Central right upper lobe pulmonary arterial filling defects. The right ventricular to left ventricular ratio is 1.0. No enlarged lymph nodes. Lungs/Pleura: No pulmonary mass, infiltrate, or effusion. Upper abdomen: No acute findings. Musculoskeletal: Thoracic and upper lumbar  spine degenerative changes. Review of the MIP images confirms the above findings. IMPRESSION: Central right upper lobe pulmonary emboli with CT evidence of right heart strain (RV/LV Ratio = 1.0) consistent with at least submassive (intermediate risk) PE. The presence of right heart strain has been associated with an increased risk of morbidity and mortality. Please activate Code PE by paging [phone]. Critical Value/emergent results were called by telephone at the time of interpretation on 04/09/2015 at 7:57 pm to Dr. Theotis Burrow , who verbally acknowledged these results. Electronically Signed   By: Claudie Revering M.D.   On: 04/09/2015 19:58    Microbiology: Recent Results (from the past 240 hour(s))  Culture, blood (routine x 2)     Status: None (Preliminary result)   Collection Time: 04/09/15  6:35 PM  Result Value Ref Range Status   Specimen Description BLOOD RIGHT HAND  Final   Special Requests IN PEDIATRIC BOTTLE 3CC  Final   Culture NO GROWTH 3 DAYS  Final   Report Status PENDING  Incomplete  Culture, blood (routine x 2)     Status: None (Preliminary result)   Collection Time: 04/09/15  6:40 PM  Result Value Ref Range Status   Specimen Description BLOOD LEFT ANTECUBITAL  Final   Special Requests BOTTLES DRAWN AEROBIC AND ANAEROBIC 5CC  Final   Culture NO GROWTH 3 DAYS  Final   Report Status PENDING  Incomplete  Urine culture     Status: None   Collection Time: 04/09/15  8:19 PM  Result Value Ref Range Status   Specimen Description URINE, CLEAN CATCH  Final   Special Requests Normal  Final   Culture MULTIPLE SPECIES PRESENT, SUGGEST RECOLLECTION  Final   Report Status 04/11/2015 FINAL  Final  MRSA PCR Screening     Status: None   Collection Time: 04/10/15  3:00 AM  Result Value Ref Range Status   MRSA by PCR NEGATIVE NEGATIVE Final    Comment:        The GeneXpert MRSA Assay (FDA approved for NASAL specimens only), is one component of a comprehensive MRSA  colonization surveillance program. It is not intended to diagnose MRSA infection nor to guide or monitor treatment for MRSA infections.      Labs: Basic Metabolic Panel:  Recent Labs Lab 04/09/15 1707 04/10/15 0233  NA 141 144  K 3.6 3.5  CL 109 116*  CO2 21* 20*  GLUCOSE 121* 121*  BUN 25* 20  CREATININE 1.33* 1.15*  CALCIUM 8.9 7.3*  MG  --  1.6*  PHOS  --  3.2   Liver Function Tests:  Recent Labs Lab 04/10/15 0233  AST 14*  ALT 14  ALKPHOS 40  BILITOT 0.4  PROT 4.5*  ALBUMIN 2.4*   CBC:  Recent Labs Lab 04/09/15 1707  04/10/15 0233 04/11/15 0238 04/12/15 0757  WBC 10.6* 11.2* 7.7 6.0  HGB 18.1* 14.2 13.8 11.7*  HCT 52.0* 41.9 40.0 35.5*  MCV 89.3 86.9 88.7 87.7  PLT 286 242 201 188   Cardiac Enzymes:  Recent Labs Lab 04/10/15 0008 04/10/15 0641 04/10/15 1305  TROPONINI <0.03 <0.03 <0.03   BNP: BNP (last 3 results)  Recent Labs  04/09/15 1707  BNP 37.5     Signed:  Claire Dolores S MD.  [facility] 04/12/2015, 11:41 AM

## 2015-04-12 NOTE — Discharge Instructions (Signed)
Information on my medicine - XARELTO (rivaroxaban)  This medication education was reviewed with me or my healthcare representative as part of my discharge preparation.  The pharmacist that spoke with me during my hospital stay was:  Lawson Radar, [place]? Xarelto was prescribed to treat blood clots that may have been found in the veins of your legs (deep vein thrombosis) or in your lungs (pulmonary embolism) and to reduce the risk of them occurring again.  What do you need to know about Xarelto? The starting dose is one 15 mg tablet taken TWICE daily with food for the FIRST 21 DAYS then on March 17th, 2017  the dose is changed to one 20 mg tablet taken ONCE A DAY with your evening meal.  DO NOT stop taking Xarelto without talking to the health care provider who prescribed the medication.  Refill your prescription for 20 mg tablets before you run out.  After discharge, you should have regular check-up appointments with your healthcare provider that is prescribing your Xarelto.  In the future your dose may need to be changed if your kidney function changes by a significant amount.  What do you do if you miss a dose? If you are taking Xarelto TWICE DAILY and you miss a dose, take it as soon as you remember. You may take two 15 mg tablets (total 30 mg) at the same time then resume your regularly scheduled 15 mg twice daily the next day.  If you are taking Xarelto ONCE DAILY and you miss a dose, take it as soon as you remember on the same day then continue your regularly scheduled once daily regimen the next day. Do not take two doses of Xarelto at the same time.   Important Safety Information Xarelto is a blood thinner medicine that can cause bleeding. You should call your healthcare provider right away if you experience any of the following: ? Bleeding from an injury or your nose that does not stop. ? Unusual colored urine (red or dark brown) or  unusual colored stools (red or black). ? Unusual bruising for unknown reasons. ? A serious fall or if you hit your head (even if there is no bleeding).  Some medicines may interact with Xarelto and might increase your risk of bleeding while on Xarelto. To help avoid this, consult your healthcare provider or pharmacist prior to using any new prescription or non-prescription medications, including herbals, vitamins, non-steroidal anti-inflammatory drugs (NSAIDs) and supplements.  This website has more information on Xarelto: [email or web address]

## 2015-04-12 NOTE — Progress Notes (Signed)
Echocardiogram 2D Echocardiogram has been performed.  Ashley Larsen 04/12/2015, 2:11 PM

## 2015-04-14 LAB — CULTURE, BLOOD (ROUTINE X 2)
CULTURE: NO GROWTH
Culture: NO GROWTH

## 2015-04-16 LAB — FACTOR 5 LEIDEN

## 2015-04-16 LAB — PROTHROMBIN GENE MUTATION

## 2015-04-18 ENCOUNTER — Telehealth: Payer: Self-pay | Admitting: Internal Medicine

## 2015-04-18 NOTE — Telephone Encounter (Signed)
Patient calling to get HFU in 2 weeks. The appointments that are available are for 15 min slots.  Dr. Annamaria Boots, please advise where I can add patient to your schedule for HFU or do you prefer that she see a NP?

## 2015-04-18 NOTE — Telephone Encounter (Signed)
lmtcb X1 for pt to schedule HFU either with a NP in a 30 minute slot or in an available 15 minute slot with CY- whichever pt prefers.

## 2015-04-18 NOTE — Telephone Encounter (Signed)
Left message for patient to call back  

## 2015-04-18 NOTE — Telephone Encounter (Signed)
Pt returning cal and can be reached @ [phone] .Ashley Larsen

## 2015-04-18 NOTE — Telephone Encounter (Signed)
Either way is ok

## 2015-04-21 NOTE — Telephone Encounter (Signed)
Spoke with pt, scheduled HFU with SG.  Nothing further needed.

## 2015-05-02 ENCOUNTER — Ambulatory Visit (INDEPENDENT_AMBULATORY_CARE_PROVIDER_SITE_OTHER): Payer: BLUE CROSS/BLUE SHIELD | Admitting: Acute Care

## 2015-05-02 ENCOUNTER — Other Ambulatory Visit (INDEPENDENT_AMBULATORY_CARE_PROVIDER_SITE_OTHER): Payer: BLUE CROSS/BLUE SHIELD

## 2015-05-02 ENCOUNTER — Encounter: Payer: Self-pay | Admitting: Acute Care

## 2015-05-02 VITALS — BP 146/90 | HR 88 | Temp 99.4°F | Ht 65.0 in | Wt 177.4 lb

## 2015-05-02 DIAGNOSIS — F17200 Nicotine dependence, unspecified, uncomplicated: Secondary | ICD-10-CM

## 2015-05-02 DIAGNOSIS — Z5181 Encounter for therapeutic drug level monitoring: Secondary | ICD-10-CM | POA: Diagnosis not present

## 2015-05-02 DIAGNOSIS — E890 Postprocedural hypothyroidism: Secondary | ICD-10-CM

## 2015-05-02 DIAGNOSIS — R634 Abnormal weight loss: Secondary | ICD-10-CM | POA: Diagnosis not present

## 2015-05-02 DIAGNOSIS — I2699 Other pulmonary embolism without acute cor pulmonale: Secondary | ICD-10-CM | POA: Diagnosis not present

## 2015-05-02 LAB — BASIC METABOLIC PANEL
BUN: 11 mg/dL (ref 6–23)
CALCIUM: 9.8 mg/dL (ref 8.4–10.5)
CO2: 31 meq/L (ref 19–32)
CREATININE: 0.88 mg/dL (ref 0.40–1.20)
Chloride: 104 mEq/L (ref 96–112)
GFR: 83.53 mL/min (ref >60.00)
Glucose, Bld: 90 mg/dL (ref 70–99)
Potassium: 3.5 mEq/L (ref 3.5–5.1)
Sodium: 142 mEq/L (ref 135–145)

## 2015-05-02 LAB — TSH: TSH: 8.61 u[IU]/mL — ABNORMAL HIGH (ref 0.35–4.50)

## 2015-05-02 NOTE — Assessment & Plan Note (Signed)
Encouraged continued increase in activity. Counseled on importance of weight loss.

## 2015-05-02 NOTE — Assessment & Plan Note (Signed)
Patient states she has not smoked since the end of January 2017. Plan: Continue counseling. We discussed importance of remaining smoke free for her overall health.. We discussed consequences of smoking in regard to her COPD, and pulmonary embolism.

## 2015-05-02 NOTE — Progress Notes (Signed)
Subjective:    Patient ID: Ashley Larsen, female    DOB: Feb 26, 1950, 65 y.o.   MRN: [id number]  HPI 65 year old female smoker seen by Dr. Annamaria Boots for COPD, tobacco use, allergic rhinitis. Medical history also includes GERD, HTN  Significant Events/Procedures: 04/09/15 Hospital Admission for Pulmonary Embolism:  Admit date: 04/09/2015 Discharge date: 04/12/2015  Discharge Diagnoses:  Active Problems:  TOBACCO ABUSE  Essential hypertension  Dehydration  Pulmonary emboli (HCC)  Sepsis (HCC)  Hypotension  Pulmonary embolism ([place])   04/09/15: CT Chest Angio  IMPRESSION: Central right upper lobe pulmonary emboli with CT evidence of right heart strain (RV/LV Ratio = 1.0) consistent with at least submassive (intermediate risk) PE. The presence of right heart strain has been associated with an increased risk of morbidity and mortality.  04/12/15:Echo  Left ventricle: The cavity size was normal. There was mild  concentric hypertrophy. Systolic function was vigorous. The  estimated ejection fraction was in the range of 65% to 70%. Wall  motion was normal; there were no regional wall motion  abnormalities. Doppler parameters are consistent with abnormal  left ventricular relaxation (grade 1 diastolic dysfunction). - Left atrium: The atrium was mildly dilated.  05/02/15: Hospital Follow  OV: Pt. Presents to the office for hospital follow up of RUL pulmonary emboli diagnosed 04/09/15.Hypercoagulable workup was negative. Chest xray showed no acute abnormality.She was discharged on 04/12/15 on Xarelto. She has taken 15 mg BID  for 21 days. She has the prescription for 20 mg once daily in hand and will start that dosage tonight. She understands that there cannot be any interruption in her treatment. She denies cough, hemoptysis, chest pain or shortness of breath.She states that she has had some intermittent chest discomfort that is positional and resolves with repositioning. She  states it is not similar to the pain she had with her PE. She is slowly increasing her activity. She states she feels much better. We discussed the importance of being compliant with her medication. We discussed the care she must  take in regard to falls. I have asked her to call or seek emergency treatment for bleeding while on this medication. She verbalized understanding.   Current outpatient prescriptions:  .  fexofenadine (ALLEGRA) 180 MG tablet, Take 1 tablet (180 mg total) by mouth daily as needed for allergies., Disp: 90 tablet, Rfl: 3 .  levothyroxine (SYNTHROID, LEVOTHROID) 75 MCG tablet, Take 1 tablet (75 mcg total) by mouth daily., Disp: 90 tablet, Rfl: 3 .  loratadine (CLARITIN) 10 MG tablet, Take 1 tablet (10 mg total) by mouth daily. (Patient taking differently: Take 10 mg by mouth daily as needed. ), Disp: 100 tablet, Rfl: 3 .  Rivaroxaban (XARELTO STARTER PACK) 15 & 20 MG TBPK, Take as directed on package: Start with one 15mg  tablet by mouth twice a day with food. On Day 22, switch to one 20mg  tablet once a day with food., Disp: 51 each, Rfl: 0 .  HYDROcodone-acetaminophen (NORCO/VICODIN) 5-325 MG tablet, Take 1 tablet by mouth every 8 (eight) hours as needed for severe pain. (Patient not taking: Reported on 05/02/2015), Disp: 30 tablet, Rfl: 0 .  NON FORMULARY, Reported on 05/02/2015, Disp: , Rfl:  .  Polyethyl Glycol-Propyl Glycol (SYSTANE OP), Place 1 drop into both eyes daily as needed. Reported on 05/02/2015, Disp: , Rfl:  .  promethazine-codeine (PHENERGAN WITH CODEINE) 6.25-10 MG/5ML syrup, take 5 milliliters by mouth every 6 hours if needed for cough (Patient not taking: Reported on 05/02/2015), Disp: 300  mL, Rfl: 0 .  rivaroxaban (XARELTO) 20 MG TABS tablet, Take 1 tablet (20 mg total) by mouth daily with supper. (Patient not taking: Reported on 05/02/2015), Disp: 30 tablet, Rfl: 3 .  Selenium 100 MCG TABS, 1 po bid for your eye condition (Patient not taking: Reported on 04/09/2015),  Disp: 200 each, Rfl: 2   Past Medical History  Diagnosis Date  . Asthma   . Allergic rhinitis   . Insomnia   . Paresthesia   . GERD (gastroesophageal reflux disease)   . Diverticulosis   . Ovarian cyst   . Alopecia   . IBS (irritable bowel syndrome)   . Hypertension   . COPD (chronic obstructive pulmonary disease) ([place])   . Hyperthyroidism   . Cataract     bilateral cateracts    Allergies  Allergen Reactions  . Metronidazole     REACTION: upset stomach  . Oxycodone-Acetaminophen     Pt tolerates Vicodin    Review of Systems Constitutional:   No  weight loss, night sweats,  Fevers, chills, fatigue, or  lassitude.  HEENT:   No headaches,  Difficulty swallowing,  Tooth/dental problems, or  Sore throat,                No sneezing, itching, ear ache, nasal congestion, post nasal drip,   CV:  No chest pain,  Orthopnea, PND, swelling in lower extremities, anasarca, dizziness, palpitations, syncope.   GI  No heartburn, indigestion, abdominal pain, nausea, vomiting, diarrhea, change in bowel habits, loss of appetite, bloody stools.   Resp: No shortness of breath with exertion or at rest.  No excess mucus, no productive cough,  No non-productive cough,  No coughing up of blood.  No change in color of mucus.  No wheezing.  No chest wall deformity  Skin: no rash or lesions.  GU: no dysuria, change in color of urine, no urgency or frequency.  No flank pain, no hematuria   MS:  No joint pain or swelling.  No decreased range of motion.  No back pain.  Psych:  No change in mood or affect. No depression or anxiety.  No memory loss.        Objective:   Physical Exam  BP 146/90 mmHg  Pulse 88  Temp(Src) 99.4 F (37.4 C) (Oral)  Ht 5\' 5"  (1.651 m)  Wt 177 lb 6.4 oz (80.468 kg)  BMI 29.52 kg/m2  SpO2 98%  Physical Exam:  General- No distress,  A&Ox3, pleasant female ENT: No sinus tenderness, TM clear, pale nasal mucosa, no oral exudate,no post nasal drip, no  LAN Cardiac: S1, S2, regular rate and rhythm, no murmur Chest: No wheeze/ rales/ dullness; no accessory muscle use, no nasal flaring, no sternal retractions Abd.: Soft Non-tender Ext: No clubbing cyanosis, edema Neuro:  normal strength Skin: No rashes, warm and dry Psych: normal mood and behavior    Magdalen Spatz, AGACNP-BC [place] Pager # [phone] 05/02/15 Assessment & Plan:

## 2015-05-02 NOTE — Patient Instructions (Addendum)
It is nice to meet you today. I am glad you are feeling better. Start your Xaralto 20 mg daily today  We will check some labs today before you go home. We will call you with the results Follow up with Dr. Annamaria Boots in 3 months. Please contact office for sooner follow up if symptoms do not improve or worsen or seek emergency care

## 2015-05-02 NOTE — Assessment & Plan Note (Addendum)
Continues to improve post hospitalization . No shortness of breath No hemoptysis Chest pain has resolved. Hemodynamically stable  Plan: Start Xaralto 20 mg once daily today. ( She has taken the 15 mg BID for 21 days) Patient has prescription for this in hand. Check renal function today. Follow up with Dr. Annamaria Boots in 3 months. Please contact office for sooner follow up if symptoms do not improve or worsen or seek emergency care

## 2015-05-05 ENCOUNTER — Encounter: Payer: Self-pay | Admitting: Internal Medicine

## 2015-05-05 ENCOUNTER — Ambulatory Visit (INDEPENDENT_AMBULATORY_CARE_PROVIDER_SITE_OTHER): Payer: BLUE CROSS/BLUE SHIELD | Admitting: Internal Medicine

## 2015-05-05 ENCOUNTER — Ambulatory Visit (INDEPENDENT_AMBULATORY_CARE_PROVIDER_SITE_OTHER): Payer: BLUE CROSS/BLUE SHIELD | Admitting: *Deleted

## 2015-05-05 VITALS — BP 150/90 | HR 83 | Wt 176.0 lb

## 2015-05-05 DIAGNOSIS — E890 Postprocedural hypothyroidism: Secondary | ICD-10-CM

## 2015-05-05 DIAGNOSIS — J449 Chronic obstructive pulmonary disease, unspecified: Secondary | ICD-10-CM | POA: Diagnosis not present

## 2015-05-05 DIAGNOSIS — J3089 Other allergic rhinitis: Secondary | ICD-10-CM

## 2015-05-05 DIAGNOSIS — L910 Hypertrophic scar: Secondary | ICD-10-CM

## 2015-05-05 DIAGNOSIS — I2699 Other pulmonary embolism without acute cor pulmonale: Secondary | ICD-10-CM | POA: Diagnosis not present

## 2015-05-05 DIAGNOSIS — M5441 Lumbago with sciatica, right side: Secondary | ICD-10-CM

## 2015-05-05 DIAGNOSIS — I10 Essential (primary) hypertension: Secondary | ICD-10-CM

## 2015-05-05 DIAGNOSIS — J309 Allergic rhinitis, unspecified: Secondary | ICD-10-CM | POA: Diagnosis not present

## 2015-05-05 DIAGNOSIS — M5442 Lumbago with sciatica, left side: Secondary | ICD-10-CM

## 2015-05-05 MED ORDER — FEXOFENADINE HCL 180 MG PO TABS: 180.0000 mg | ORAL_TABLET | Freq: Every day | ORAL | Status: DC | PRN

## 2015-05-05 MED ORDER — FLUTICASONE PROPIONATE 50 MCG/ACT NA SUSP: 2.0000 | Freq: Every day | NASAL | Status: DC

## 2015-05-05 MED ORDER — HYDROCODONE-ACETAMINOPHEN 5-325 MG PO TABS: 1.0000 | ORAL_TABLET | Freq: Three times a day (TID) | ORAL | Status: DC | PRN

## 2015-05-05 NOTE — Assessment & Plan Note (Signed)
In the process of quitting

## 2015-05-05 NOTE — Assessment & Plan Note (Signed)
On Levothroid 

## 2015-05-05 NOTE — Assessment & Plan Note (Signed)
Losartan 

## 2015-05-05 NOTE — Assessment & Plan Note (Signed)
Flonase added. 

## 2015-05-05 NOTE — Progress Notes (Signed)
Pre visit review using our clinic review tool, if applicable. No additional management support is needed unless otherwise documented below in the visit note. 

## 2015-05-05 NOTE — Progress Notes (Signed)
Subjective:  Patient ID: Ashley Larsen, female    DOB: 1950/10/25  Age: 65 y.o. MRN: [id number]  CC: No chief complaint on file.   HPI Chantea Langbehn presents for post-hosp f/u (PE, SOB). 2/17 d/c summary reviewed C/o R shoulder pain, allergies   "Discharge Diagnoses:  Active Problems:  TOBACCO ABUSE  Essential hypertension  Dehydration  Pulmonary emboli (HCC)  Sepsis (HCC)  Hypotension  Pulmonary embolism ([place])   04/09/15: CT Chest Angio  IMPRESSION: Central right upper lobe pulmonary emboli with CT evidence of right heart strain (RV/LV Ratio = 1.0) consistent with at least submassive (intermediate risk) PE. The presence of right heart strain has been associated with an increased risk of morbidity and mortality.  04/12/15:Echo  Left ventricle: The cavity size was normal. There was mild  concentric hypertrophy. Systolic function was vigorous. The  estimated ejection fraction was in the range of 65% to 70%. Wall  motion was normal; there were no regional wall motion  abnormalities. Doppler parameters are consistent with abnormal  left ventricular relaxation (grade 1 diastolic dysfunction). - Left atrium: The atrium was mildly dilated."  Outpatient Prescriptions Prior to Visit  Medication Sig Dispense Refill  . levothyroxine (SYNTHROID, LEVOTHROID) 75 MCG tablet Take 1 tablet (75 mcg total) by mouth daily. 90 tablet 3  . NON FORMULARY Reported on 05/02/2015    . Polyethyl Glycol-Propyl Glycol (SYSTANE OP) Place 1 drop into both eyes daily as needed. Reported on 05/02/2015    . Rivaroxaban (XARELTO STARTER PACK) 15 & 20 MG TBPK Take as directed on package: Start with one 15mg  tablet by mouth twice a day with food. On Day 22, switch to one 20mg  tablet once a day with food. 51 each 0  . rivaroxaban (XARELTO) 20 MG TABS tablet Take 1 tablet (20 mg total) by mouth daily with supper. 30 tablet 3  . Selenium 100 MCG TABS 1 po bid for your eye condition 200 each 2  .  fexofenadine (ALLEGRA) 180 MG tablet Take 1 tablet (180 mg total) by mouth daily as needed for allergies. 90 tablet 3  . HYDROcodone-acetaminophen (NORCO/VICODIN) 5-325 MG tablet Take 1 tablet by mouth every 8 (eight) hours as needed for severe pain. 30 tablet 0  . loratadine (CLARITIN) 10 MG tablet Take 1 tablet (10 mg total) by mouth daily. (Patient taking differently: Take 10 mg by mouth daily as needed. ) 100 tablet 3  . promethazine-codeine (PHENERGAN WITH CODEINE) 6.25-10 MG/5ML syrup take 5 milliliters by mouth every 6 hours if needed for cough 300 mL 0   No facility-administered medications prior to visit.    ROS Review of Systems  Constitutional: Negative for chills, activity change, appetite change, fatigue and unexpected weight change.  HENT: Positive for congestion and sinus pressure. Negative for mouth sores.   Eyes: Negative for visual disturbance.  Respiratory: Negative for cough and chest tightness.   Gastrointestinal: Negative for nausea and abdominal pain.  Genitourinary: Negative for frequency, difficulty urinating and vaginal pain.  Musculoskeletal: Negative for back pain and gait problem.  Skin: Negative for pallor and rash.  Neurological: Negative for dizziness, tremors, weakness, numbness and headaches.  Psychiatric/Behavioral: Positive for dysphoric mood. Negative for suicidal ideas, confusion and sleep disturbance. The patient is not nervous/anxious.     Objective:  BP 150/90 mmHg  Pulse 83  Wt 176 lb (79.833 kg)  SpO2 97%  BP Readings from Last 3 Encounters:  05/05/15 150/90  05/02/15 146/90  04/12/15 128/42    Wt Readings  from Last 3 Encounters:  05/05/15 176 lb (79.833 kg)  05/02/15 177 lb 6.4 oz (80.468 kg)  04/09/15 175 lb (79.379 kg)    Physical Exam  Constitutional: She appears well-developed. No distress.  HENT:  Head: Normocephalic.  Right Ear: External ear normal.  Left Ear: External ear normal.  Nose: Nose normal.  Mouth/Throat:  Oropharynx is clear and moist.  Eyes: Conjunctivae are normal. Pupils are equal, round, and reactive to light. Right eye exhibits no discharge. Left eye exhibits no discharge.  Neck: Normal range of motion. Neck supple. No JVD present. No tracheal deviation present. No thyromegaly present.  Cardiovascular: Normal rate, regular rhythm and normal heart sounds.   Pulmonary/Chest: No stridor. No respiratory distress. She has no wheezes.  Abdominal: Soft. Bowel sounds are normal. She exhibits no distension and no mass. There is no tenderness. There is no rebound and no guarding.  Musculoskeletal: She exhibits no edema or tenderness.  Lymphadenopathy:    She has no cervical adenopathy.  Neurological: She displays normal reflexes. No cranial nerve deficit. She exhibits normal muscle tone. Coordination normal.  Skin: No rash noted. No erythema.  Psychiatric: She has a normal mood and affect. Her behavior is normal. Judgment and thought content normal.   R shoulder keloids x2 are tender  Lab Results  Component Value Date   WBC 6.0 04/12/2015   HGB 11.7* 04/12/2015   HCT 35.5* 04/12/2015   PLT 188 04/12/2015   GLUCOSE 90 05/02/2015   CHOL 113 01/31/2013   TRIG 89.0 01/31/2013   HDL 34.80* 01/31/2013   LDLDIRECT 185.7 01/19/2012   LDLCALC 60 01/31/2013   ALT 14 04/10/2015   AST 14* 04/10/2015   NA 142 05/02/2015   K 3.5 05/02/2015   CL 104 05/02/2015   CREATININE 0.88 05/02/2015   BUN 11 05/02/2015   CO2 31 05/02/2015   TSH 8.61* 05/02/2015   INR 0.9 01/10/2007   HGBA1C 6.0 08/26/2008    Dg Chest 2 View  04/09/2015  CLINICAL DATA:  Shortness of breath beginning yesterday.  Fatigue. EXAM: CHEST  2 VIEW COMPARISON:  01/23/2014 FINDINGS: The heart size and mediastinal contours are within normal limits. Both lungs are clear. No evidence of pneumothorax or pleural effusion. Mild thoracic spine degenerative changes noted. IMPRESSION: No active cardiopulmonary disease. Electronically Signed    By: Earle Gell M.D.   On: 04/09/2015 17:24   Ct Angio Chest Pe W/cm &/or Wo Cm  04/09/2015  CLINICAL DATA:  Shortness of breath for several weeks. Worse the past 2 days. Tachycardia. Hypotension. EXAM: CT ANGIOGRAPHY CHEST WITH CONTRAST TECHNIQUE: Multidetector CT imaging of the chest was performed using the standard protocol during bolus administration of intravenous contrast. Multiplanar CT image reconstructions and MIPs were obtained to evaluate the vascular anatomy. CONTRAST:  23mL OMNIPAQUE IOHEXOL 350 MG/ML SOLN COMPARISON:  Chest radiographs obtained earlier today. Chest CTA dated 04/18/2009. FINDINGS: Mediastinum/Lymph Nodes: Central right upper lobe pulmonary arterial filling defects. The right ventricular to left ventricular ratio is 1.0. No enlarged lymph nodes. Lungs/Pleura: No pulmonary mass, infiltrate, or effusion. Upper abdomen: No acute findings. Musculoskeletal: Thoracic and upper lumbar spine degenerative changes. Review of the MIP images confirms the above findings. IMPRESSION: Central right upper lobe pulmonary emboli with CT evidence of right heart strain (RV/LV Ratio = 1.0) consistent with at least submassive (intermediate risk) PE. The presence of right heart strain has been associated with an increased risk of morbidity and mortality. Please activate Code PE by paging [phone]. Critical Value/emergent  results were called by telephone at the time of interpretation on 04/09/2015 at 7:57 pm to Dr. Theotis Burrow , who verbally acknowledged these results. Electronically Signed   By: Claudie Revering M.D.   On: 04/09/2015 19:58    Assessment & Plan:   Diagnoses and all orders for this visit:  Other acute pulmonary embolism without acute cor pulmonale (HCC)  COPD with chronic bronchitis (HCC)  Hypothyroidism, postablative  Bilateral low back pain with sciatica, sciatica laterality unspecified  Essential hypertension  Keloid of skin  Other allergic rhinitis  Other orders -      fexofenadine (ALLEGRA) 180 MG tablet; Take 1 tablet (180 mg total) by mouth daily as needed for allergies. -     HYDROcodone-acetaminophen (NORCO/VICODIN) 5-325 MG tablet; Take 1 tablet by mouth every 8 (eight) hours as needed for severe pain. -     fluticasone (FLONASE) 50 MCG/ACT nasal spray; Place 2 sprays into both nostrils daily.  I have discontinued Ms. Aull's loratadine and promethazine-codeine. I am also having her start on fluticasone. Additionally, I am having her maintain her Selenium, levothyroxine, NON FORMULARY, Polyethyl Glycol-Propyl Glycol (SYSTANE OP), rivaroxaban, Rivaroxaban, losartan, fexofenadine, and HYDROcodone-acetaminophen.  Meds ordered this encounter  Medications  . losartan (COZAAR) 100 MG tablet    Sig: Take 1 tablet by mouth daily.    Refill:  1  . fexofenadine (ALLEGRA) 180 MG tablet    Sig: Take 1 tablet (180 mg total) by mouth daily as needed for allergies.    Dispense:  90 tablet    Refill:  3  . HYDROcodone-acetaminophen (NORCO/VICODIN) 5-325 MG tablet    Sig: Take 1 tablet by mouth every 8 (eight) hours as needed for severe pain.    Dispense:  90 tablet    Refill:  0  . fluticasone (FLONASE) 50 MCG/ACT nasal spray    Sig: Place 2 sprays into both nostrils daily.    Dispense:  16 g    Refill:  6     Follow-up: Return in about 3 months (around 08/05/2015) for a follow-up visit.  Walker Kehr, MD

## 2015-05-05 NOTE — Assessment & Plan Note (Addendum)
04/09/15: CT Chest Angio  IMPRESSION: Central right upper lobe pulmonary emboli with CT evidence of right heart strain (RV/LV Ratio = 1.0) consistent with at least submassive (intermediate risk) PE. The presence of right heart strain has been associated with an increased risk of morbidity and mortality.  On Xarelto

## 2015-05-05 NOTE — Assessment & Plan Note (Signed)
Norco prn  Potential benefits of a long term opioids use as well as potential risks (i.e. addiction risk, apnea etc) and complications (i.e. Somnolence, constipation and others) were explained to the patient and were aknowledged. 

## 2015-05-05 NOTE — Assessment & Plan Note (Signed)
R shoulder - painfull Steroid inj offered

## 2015-05-11 NOTE — Progress Notes (Signed)
Quick Note:  Please let patient know that the thyroid levels are abnormal, need to see her to discuss this ______

## 2015-05-13 ENCOUNTER — Ambulatory Visit: Payer: BLUE CROSS/BLUE SHIELD

## 2015-05-14 ENCOUNTER — Encounter: Payer: Self-pay | Admitting: Endocrinology

## 2015-05-14 ENCOUNTER — Ambulatory Visit (INDEPENDENT_AMBULATORY_CARE_PROVIDER_SITE_OTHER): Payer: BLUE CROSS/BLUE SHIELD | Admitting: Endocrinology

## 2015-05-14 VITALS — BP 142/92 | HR 90 | Temp 98.1°F | Resp 14 | Ht 65.0 in | Wt 177.0 lb

## 2015-05-14 DIAGNOSIS — E890 Postprocedural hypothyroidism: Secondary | ICD-10-CM

## 2015-05-14 MED ORDER — LEVOTHYROXINE SODIUM 88 MCG PO TABS: 88.0000 ug | ORAL_TABLET | Freq: Every day | ORAL | Status: DC

## 2015-05-14 NOTE — Progress Notes (Signed)
Patient ID: Ashley Larsen, female   DOB: [birth year], 65 y.o.   MRN: [id number]   Reason for Appointment:  Post ablative hypothyroidism, followup   History of Present Illness:   She had hyperthyroidism first diagnosed in 01/2013 and treated with radioactive iodine on 05/03/13 with 12 mCi She became hypothyroid subsequently and in 6/15 with a free T4 level of 0.57 she was started on 112 mcg Synthroid  Recent history: Her dose was reduced to 88 mcg in 07/2013 because of relatively high free T4 of 1.4 and low TSH  Since 6/16 she has been taking 75 g levothyroxine daily  She does complain of unusual fatigue since about January Also complaining of some cold intolerance at times  She has been regular with her Synthroid dose before breakfast and has not taken any new vitamins or calcium She is having a much higher TSH which has been checked 3 times in the last month or so, highest when she was admitted to the hospital Her dose has not been changed   Wt Readings from Last 3 Encounters:  05/14/15 177 lb (80.287 kg)  05/05/15 176 lb (79.833 kg)  05/02/15 177 lb 6.4 oz (80.468 kg)   Labs:   Lab Results  Component Value Date   TSH 8.61* 05/02/2015   TSH 4.254 04/10/2015   TSH 10.972* 04/09/2015   FREET4 0.85 04/09/2015   FREET4 0.86 01/27/2015   FREET4 0.97 07/24/2014        OPHTHALMOPATHY:  She has been using the prism lens to control diplopia  She she has been seen by neuro ophthalmologist regularly and is going to be seen in follow-up soon to assess need for further management changes Currently not on Maxzide and is not complaining of excessive swelling of the eyes  She is keeping  the head of her bed  elevated on 2 pillows     Medication List       This list is accurate as of: 05/14/15 10:31 AM.  Always use your most recent med list.               fexofenadine 180 MG tablet   Commonly known as:  ALLEGRA  Take 1 tablet (180 mg total) by mouth daily as needed for allergies.     fluticasone 50 MCG/ACT nasal spray  Commonly known as:  FLONASE  Place 2 sprays into both nostrils daily.     HYDROcodone-acetaminophen 5-325 MG tablet  Commonly known as:  NORCO/VICODIN  Take 1 tablet by mouth every 8 (eight) hours as needed for severe pain.     levothyroxine 75 MCG tablet  Commonly known as:  SYNTHROID, LEVOTHROID  Take 1 tablet (75 mcg total) by mouth daily.     losartan 100 MG tablet  Commonly known as:  COZAAR  Take 1 tablet by mouth daily.     NON FORMULARY  Reported on 05/02/2015     Rivaroxaban 15 & 20 MG Tbpk  Commonly known as:  Alveda Reasons  STARTER PACK  Take as directed on package: Start with one 15mg  tablet by mouth twice a day with food. On Day 22, switch to one 20mg  tablet once a day with food.     rivaroxaban 20 MG Tabs tablet  Commonly known as:  XARELTO  Take 1 tablet (20 mg total) by mouth daily with supper.     Selenium 100 MCG Tabs  1 po bid for your eye condition     SYSTANE OP  Place 1 drop into both eyes daily as needed. Reported on 05/02/2015            Past Medical History  Diagnosis Date  . Asthma   . Allergic rhinitis   . Insomnia   . Paresthesia   . GERD (gastroesophageal reflux disease)   . Diverticulosis   . Ovarian cyst   . Alopecia   . IBS (irritable bowel syndrome)   . Hypertension   . COPD (chronic obstructive pulmonary disease) ([place])   . Hyperthyroidism   . Cataract     bilateral cateracts    Past Surgical History  Procedure Laterality Date  . Tubal ligation    . Knee arthroscopy      Left  . Oophorectomy      Left  . Rotator cuff repair    . Colonoscopy      Family History  Problem Relation Age of Onset  . Hypertension    . Cancer Sister 34    colon  . Diabetes Sister   . Cancer Brother     brother - stomach ca  . Stomach cancer Brother   . Colon polyps Sister     and Brother  . Cancer  Sister 63     colon  . Kidney disease Brother   . Diabetes Brother   . Diabetes Brother   . Heart disease Father   . Other Mother     sclerdermia  . Diabetes Paternal Grandfather   . Colon cancer Sister   . Esophageal cancer Neg Hx   . Rectal cancer Neg Hx     Social History:  reports that she has been smoking Cigarettes.  She has a 5 pack-year smoking history. She has never used smokeless tobacco. She reports that she does not drink alcohol or use illicit drugs.  Allergies:  Allergies  Allergen Reactions  . Metronidazole     REACTION: upset stomach  . Oxycodone-Acetaminophen     Pt tolerates Vicodin    Review of Systems:   HYPERTENSION: Has history of high blood pressure now treated with losartan  History of hypokalemia on HCTZ which was stopped and Maxzide was stopped by PCP because of low potassium also  However her potassium is still low, has only occasional muscle cramps    Examination:   BP 142/92 mmHg  Pulse 90  Temp(Src) 98.1 F (36.7 C)  Resp 14  Ht 5\' 5"  (1.651 m)  Wt 177 lb (80.287 kg)  BMI 29.45 kg/m2  SpO2 98%    Eyes:  mild eyelid swelling present along with mild proptosis  She has medial abduction of the left eye Thyroid not palpable  REFLEXES: at biceps are normal    Assessment/Plan:   Post-ablative hypothyroidism  She has post ablative hypothyroidism and thyroid levels are usually higher recently even though she had been stable on 75 g previously She is also complaining of fatigue and cold intolerance  For now will increase her dose to 88 g insert TSH has been somewhat inconsistent  recently partly related to her acute illness  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

## 2015-05-15 ENCOUNTER — Ambulatory Visit (INDEPENDENT_AMBULATORY_CARE_PROVIDER_SITE_OTHER): Payer: BLUE CROSS/BLUE SHIELD | Admitting: *Deleted

## 2015-05-15 DIAGNOSIS — J309 Allergic rhinitis, unspecified: Secondary | ICD-10-CM

## 2015-05-20 ENCOUNTER — Ambulatory Visit: Payer: 59 | Admitting: Internal Medicine

## 2015-05-21 ENCOUNTER — Ambulatory Visit (INDEPENDENT_AMBULATORY_CARE_PROVIDER_SITE_OTHER): Payer: BLUE CROSS/BLUE SHIELD | Admitting: *Deleted

## 2015-05-21 DIAGNOSIS — J309 Allergic rhinitis, unspecified: Secondary | ICD-10-CM | POA: Diagnosis not present

## 2015-05-29 ENCOUNTER — Ambulatory Visit (INDEPENDENT_AMBULATORY_CARE_PROVIDER_SITE_OTHER): Payer: BLUE CROSS/BLUE SHIELD | Admitting: *Deleted

## 2015-05-29 DIAGNOSIS — J309 Allergic rhinitis, unspecified: Secondary | ICD-10-CM | POA: Diagnosis not present

## 2015-06-05 ENCOUNTER — Ambulatory Visit (INDEPENDENT_AMBULATORY_CARE_PROVIDER_SITE_OTHER): Payer: BLUE CROSS/BLUE SHIELD | Admitting: *Deleted

## 2015-06-05 DIAGNOSIS — J309 Allergic rhinitis, unspecified: Secondary | ICD-10-CM | POA: Diagnosis not present

## 2015-06-13 ENCOUNTER — Ambulatory Visit (INDEPENDENT_AMBULATORY_CARE_PROVIDER_SITE_OTHER): Payer: BLUE CROSS/BLUE SHIELD

## 2015-06-13 DIAGNOSIS — J309 Allergic rhinitis, unspecified: Secondary | ICD-10-CM | POA: Diagnosis not present

## 2015-06-20 ENCOUNTER — Ambulatory Visit (INDEPENDENT_AMBULATORY_CARE_PROVIDER_SITE_OTHER): Payer: BLUE CROSS/BLUE SHIELD | Admitting: *Deleted

## 2015-06-20 DIAGNOSIS — J309 Allergic rhinitis, unspecified: Secondary | ICD-10-CM | POA: Diagnosis not present

## 2015-06-23 ENCOUNTER — Other Ambulatory Visit (INDEPENDENT_AMBULATORY_CARE_PROVIDER_SITE_OTHER): Payer: BLUE CROSS/BLUE SHIELD

## 2015-06-23 DIAGNOSIS — E890 Postprocedural hypothyroidism: Secondary | ICD-10-CM

## 2015-06-23 LAB — T4, FREE: FREE T4: 0.91 ng/dL (ref 0.60–1.60)

## 2015-06-23 LAB — TSH: TSH: 2.55 u[IU]/mL (ref 0.35–4.50)

## 2015-06-25 ENCOUNTER — Ambulatory Visit (INDEPENDENT_AMBULATORY_CARE_PROVIDER_SITE_OTHER): Payer: BLUE CROSS/BLUE SHIELD | Admitting: Endocrinology

## 2015-06-25 ENCOUNTER — Encounter: Payer: Self-pay | Admitting: Endocrinology

## 2015-06-25 VITALS — BP 134/86 | HR 83 | Temp 98.1°F | Resp 14 | Ht 65.0 in | Wt 177.6 lb

## 2015-06-25 DIAGNOSIS — E890 Postprocedural hypothyroidism: Secondary | ICD-10-CM | POA: Diagnosis not present

## 2015-06-25 NOTE — Progress Notes (Signed)
Patient ID: Ashley Larsen, female   DOB: February 29, 1950, 65 y.o.   MRN: [id number]   Reason for Appointment:  Post ablative hypothyroidism, followup   History of Present Illness:   She had hyperthyroidism first diagnosed in 01/2013 and treated with radioactive iodine on 05/03/13 with 12 mCi She became hypothyroid subsequently and in 6/15 with a free T4 level of 0.57 she was started on 112 mcg Synthroid  Recent history: Her dose was reduced to 88 mcg in 07/2013 because of relatively high free T4 of 1.4 and low TSH  Subsequently this was reduced to 75 However since her TSH was high in 3/17 associated with fatigue for a couple of months she is back on 88 g  She does not complain of as much fatigue Also complaining of some cold intolerance at times which is chronic  She has been regular with her Synthroid dose before breakfast and has not taken any new vitamins or calcium Her weight is about the same   Wt Readings from Last 3 Encounters:  06/25/15 177 lb 9.6 oz (80.559 kg)  05/14/15 177 lb (80.287 kg)  05/05/15 176 lb (79.833 kg)   Labs:   Lab Results  Component Value Date   TSH 2.55 06/23/2015   TSH 8.61* 05/02/2015   TSH 4.254 04/10/2015   FREET4 0.91 06/23/2015   FREET4 0.85 04/09/2015   FREET4 0.86 01/27/2015        OPHTHALMOPATHY:  She has been using the prism lens to control diplopia  She she has been seen by neuro ophthalmologist [facility] She may need surgery for her diplopia She does not complain of swelling or dryness of the eyes  She is keeping  the head of her bed  elevated on 2 pillows     Medication List       This list is accurate as of: 06/25/15 11:59 PM.  Always use your most recent med list.               fexofenadine 180 MG tablet  Commonly known as:  ALLEGRA  Take 1 tablet (180 mg total) by mouth daily as needed for allergies.     fluticasone 50  MCG/ACT nasal spray  Commonly known as:  FLONASE  Place 2 sprays into both nostrils daily.     HYDROcodone-acetaminophen 5-325 MG tablet  Commonly known as:  NORCO/VICODIN  Take 1 tablet by mouth every 8 (eight) hours as needed for severe pain.     levothyroxine 88 MCG tablet  Commonly known as:  SYNTHROID, LEVOTHROID  Take 1 tablet (88 mcg total) by mouth daily.     losartan 100 MG tablet  Commonly known as:  COZAAR  Take 1 tablet by mouth daily.     NON FORMULARY  Reported on 05/02/2015     Rivaroxaban 15 & 20 MG Tbpk  Commonly known as:  XARELTO STARTER PACK  Take as directed on package: Start with one 15mg  tablet by mouth twice a day  with food. On Day 22, switch to one 20mg  tablet once a day with food.     rivaroxaban 20 MG Tabs tablet  Commonly known as:  XARELTO  Take 1 tablet (20 mg total) by mouth daily with supper.     Selenium 100 MCG Tabs  1 po bid for your eye condition     SYSTANE OP  Place 1 drop into both eyes daily as needed. Reported on 05/02/2015            Past Medical History  Diagnosis Date  . Asthma   . Allergic rhinitis   . Insomnia   . Paresthesia   . GERD (gastroesophageal reflux disease)   . Diverticulosis   . Ovarian cyst   . Alopecia   . IBS (irritable bowel syndrome)   . Hypertension   . COPD (chronic obstructive pulmonary disease) ([place])   . Hyperthyroidism   . Cataract     bilateral cateracts    Past Surgical History  Procedure Laterality Date  . Tubal ligation    . Knee arthroscopy      Left  . Oophorectomy      Left  . Rotator cuff repair    . Colonoscopy      Family History  Problem Relation Age of Onset  . Hypertension    . Cancer Sister 68    colon  . Diabetes Sister   . Cancer Brother     brother - stomach ca  . Stomach cancer Brother   . Colon polyps Sister     and Brother  . Cancer Sister 22     colon  . Kidney disease Brother   . Diabetes Brother   . Diabetes Brother   . Heart disease Father   .  Other Mother     sclerdermia  . Diabetes Paternal Grandfather   . Colon cancer Sister   . Esophageal cancer Neg Hx   . Rectal cancer Neg Hx     Social History:  reports that she has been smoking Cigarettes.  She has a 5 pack-year smoking history. She has never used smokeless tobacco. She reports that she does not drink alcohol or use illicit drugs.  Allergies:  Allergies  Allergen Reactions  . Metronidazole     REACTION: upset stomach  . Oxycodone-Acetaminophen     Pt tolerates Vicodin    Review of Systems:   HYPERTENSION: Has history of high blood pressure now treated with losartan but blood pressure appears to be still high  History of hypokalemia on HCTZ which was stopped and Maxzide was stopped by PCP because of low potassium also     Examination:   BP 134/86 mmHg  Pulse 83  Temp(Src) 98.1 F (36.7 C)  Resp 14  Ht 5\' 5"  (1.651 m)  Wt 177 lb 9.6 oz (80.559 kg)  BMI 29.55 kg/m2  SpO2 96%    Eyes:  mild eyelid swelling present along with mild proptosis   REFLEXES: at biceps are normal    Assessment/Plan:   Post-ablative hypothyroidism  She has post ablative hypothyroidism requiring relatively low doses of thyroid supplements Since increasing her dose up to 88 g in 3/17 she has less fatigue TSH is back to normal She is very compliant with her levothyroxine every morning  She will continue the same dose of 88 g and follow-up in 6 months, to call if she has unusual fatigue  Graves ophthalmopathy:   Is followed by ophthalmologist, pending possible surgery at [facility] for  her diplopia   Hypertension: Currently not well controlled and will continue to follow with PCP, likely needs Aldactone     Ashley Larsen 06/26/2015, 9:50 AM   Note: This office note was prepared with Dragon voice recognition system technology. Any transcriptional errors that result from this process are unintentional.

## 2015-06-27 ENCOUNTER — Ambulatory Visit (INDEPENDENT_AMBULATORY_CARE_PROVIDER_SITE_OTHER): Payer: BLUE CROSS/BLUE SHIELD | Admitting: *Deleted

## 2015-06-27 DIAGNOSIS — J309 Allergic rhinitis, unspecified: Secondary | ICD-10-CM | POA: Diagnosis not present

## 2015-07-02 ENCOUNTER — Telehealth: Payer: Self-pay | Admitting: *Deleted

## 2015-07-02 NOTE — Telephone Encounter (Signed)
OK to fill this prescription with additional refills x0 Thank you!  

## 2015-07-02 NOTE — Telephone Encounter (Signed)
Left msg on triage requesting refill on her Hydrocodone.../lmb 

## 2015-07-03 ENCOUNTER — Ambulatory Visit (INDEPENDENT_AMBULATORY_CARE_PROVIDER_SITE_OTHER): Payer: BLUE CROSS/BLUE SHIELD | Admitting: *Deleted

## 2015-07-03 ENCOUNTER — Encounter: Payer: Self-pay | Admitting: Internal Medicine

## 2015-07-03 DIAGNOSIS — J309 Allergic rhinitis, unspecified: Secondary | ICD-10-CM | POA: Diagnosis not present

## 2015-07-03 MED ORDER — HYDROCODONE-ACETAMINOPHEN 5-325 MG PO TABS: 1.0000 | ORAL_TABLET | Freq: Three times a day (TID) | ORAL | Status: DC | PRN

## 2015-07-03 NOTE — Telephone Encounter (Signed)
Called pt no answer LMOM rx ready for pick-up.../lmb 

## 2015-07-10 ENCOUNTER — Ambulatory Visit (INDEPENDENT_AMBULATORY_CARE_PROVIDER_SITE_OTHER): Payer: BLUE CROSS/BLUE SHIELD | Admitting: *Deleted

## 2015-07-10 DIAGNOSIS — J309 Allergic rhinitis, unspecified: Secondary | ICD-10-CM | POA: Diagnosis not present

## 2015-07-11 ENCOUNTER — Ambulatory Visit (INDEPENDENT_AMBULATORY_CARE_PROVIDER_SITE_OTHER): Payer: BLUE CROSS/BLUE SHIELD | Admitting: Internal Medicine

## 2015-07-11 ENCOUNTER — Telehealth: Payer: Self-pay | Admitting: Internal Medicine

## 2015-07-11 ENCOUNTER — Encounter: Payer: Self-pay | Admitting: Internal Medicine

## 2015-07-11 VITALS — BP 160/90 | HR 90 | Temp 98.1°F | Resp 20 | Wt 179.0 lb

## 2015-07-11 DIAGNOSIS — I10 Essential (primary) hypertension: Secondary | ICD-10-CM | POA: Diagnosis not present

## 2015-07-11 DIAGNOSIS — J309 Allergic rhinitis, unspecified: Secondary | ICD-10-CM | POA: Diagnosis not present

## 2015-07-11 MED ORDER — AMLODIPINE BESYLATE 5 MG PO TABS: 5.0000 mg | ORAL_TABLET | Freq: Every day | ORAL | Status: DC

## 2015-07-11 NOTE — Patient Instructions (Signed)
Please take all new medication as prescribed - the amlodipine 5 mg per day  Please continue all other medications as before, and refills have been done if requested.  Please have the pharmacy call with any other refills you may need.  Please continue your efforts at being more active, low cholesterol diet, and weight control.  Please keep your appointments with your specialists as you may have planned   

## 2015-07-11 NOTE — Progress Notes (Signed)
Subjective:    Patient ID: Ashley Larsen, female    DOB: 1950-09-09, 65 y.o.   MRN: [id number]  HPI   Here to f/u; overall doing ok, BP has been elevated recently,  Pt denies chest pain, increasing sob or doe, wheezing, orthopnea, PND, increased LE swelling, palpitations, dizziness or syncope.  Pt denies new neurological symptoms such as new headache, or facial or extremity weakness or numbness.  Pt denies polydipsia, polyuria, or low sugar episode.   Pt denies new neurological symptoms such as new headache, or facial or extremity weakness or numbness.   Pt states overall good compliance with meds, mostly trying to follow appropriate diet, with wt overall stable,  but little exercise however. BP elevated again off the HCT that in addition to the losartan 100 mg dropped the sbp to 90.  BP back up again recently. BP Readings from Last 3 Encounters:  07/11/15 160/90  06/25/15 134/86  05/14/15 142/92  S/p DVT feb 22 on anticoag Past Medical History  Diagnosis Date  . Asthma   . Allergic rhinitis   . Insomnia   . Paresthesia   . GERD (gastroesophageal reflux disease)   . Diverticulosis   . Ovarian cyst   . Alopecia   . IBS (irritable bowel syndrome)   . Hypertension   . COPD (chronic obstructive pulmonary disease) ([facility])   . Hyperthyroidism   . Cataract     bilateral cateracts   Past Surgical History  Procedure Laterality Date  . Tubal ligation    . Knee arthroscopy      Left  . Oophorectomy      Left  . Rotator cuff repair    . Colonoscopy      reports that she has been smoking Cigarettes.  She has a 5 pack-year smoking history. She has never used smokeless tobacco. She reports that she does not drink alcohol or use illicit drugs. family history includes Cancer in her brother; Cancer (age of onset: 26) in her sister; Cancer (age of onset: 32) in her sister; Colon cancer in her sister; Colon polyps in her sister; Diabetes in her brother, brother, paternal grandfather, and sister;  Heart disease in her father; Kidney disease in her brother; Other in her mother; Stomach cancer in her brother. There is no history of Esophageal cancer or Rectal cancer. Allergies  Allergen Reactions  . Metronidazole     REACTION: upset stomach  . Oxycodone-Acetaminophen     Pt tolerates Vicodin   Current Outpatient Prescriptions on File Prior to Visit  Medication Sig Dispense Refill  . fexofenadine (ALLEGRA) 180 MG tablet Take 1 tablet (180 mg total) by mouth daily as needed for allergies. 90 tablet 3  . fluticasone (FLONASE) 50 MCG/ACT nasal spray Place 2 sprays into both nostrils daily. 16 g 6  . HYDROcodone-acetaminophen (NORCO/VICODIN) 5-325 MG tablet Take 1 tablet by mouth every 8 (eight) hours as needed for severe pain. 90 tablet 0  . levothyroxine (SYNTHROID, LEVOTHROID) 88 MCG tablet Take 1 tablet (88 mcg total) by mouth daily. 30 tablet 3  . losartan (COZAAR) 100 MG tablet Take 1 tablet by mouth daily.  1  . NON FORMULARY Reported on 05/02/2015    . Polyethyl Glycol-Propyl Glycol (SYSTANE OP) Place 1 drop into both eyes daily as needed. Reported on 05/02/2015    . Rivaroxaban (XARELTO STARTER PACK) 15 & 20 MG TBPK Take as directed on package: Start with one 15mg  tablet by mouth twice a day with food. On Day 22,  switch to one 20mg  tablet once a day with food. 51 each 0  . rivaroxaban (XARELTO) 20 MG TABS tablet Take 1 tablet (20 mg total) by mouth daily with supper. 30 tablet 3  . Selenium 100 MCG TABS 1 po bid for your eye condition 200 each 2   No current facility-administered medications on file prior to visit.   Review of Systems  Constitutional: Negative for unusual diaphoresis or night sweats HENT: Negative for ear swelling or discharge Eyes: Negative for worsening visual haziness  Respiratory: Negative for choking and stridor.   Gastrointestinal: Negative for distension or worsening eructation Genitourinary: Negative for retention or change in urine volume.    Musculoskeletal: Negative for other MSK pain or swelling Skin: Negative for color change and worsening wound Neurological: Negative for tremors and numbness other than noted  Psychiatric/Behavioral: Negative for decreased concentration or agitation other than above       Objective:   Physical Exam BP 160/90 mmHg  Pulse 90  Temp(Src) 98.1 F (36.7 C) (Oral)  Resp 20  Wt 179 lb (81.194 kg)  SpO2 97% VS noted,  Constitutional: Pt appears in no apparent distress HENT: Head: NCAT.  Right Ear: External ear normal.  Left Ear: External ear normal.  Eyes: . Pupils are equal, round, and reactive to light. Conjunctivae and EOM are normal Neck: Normal range of motion. Neck supple.  Cardiovascular: Normal rate and regular rhythm.   Pulmonary/Chest: Effort normal and breath sounds without rales or wheezing.  Abd:  Soft, NT, ND, + BS, no bruit Neurological: Pt is alert. Not confused , motor grossly intact Skin: Skin is warm. No rash, no LE edema Psychiatric: Pt behavior is normal. No agitation.     Assessment & Plan:

## 2015-07-11 NOTE — Progress Notes (Signed)
Pre visit review using our clinic review tool, if applicable. No additional management support is needed unless otherwise documented below in the visit note. 

## 2015-07-12 NOTE — Assessment & Plan Note (Signed)
Mild to mod uncontrolled, to add amlod 5 qd, to f/u any worsening symptoms or concerns  BP Readings from Last 3 Encounters:  07/11/15 160/90  06/25/15 134/86  05/14/15 142/92

## 2015-07-15 NOTE — Telephone Encounter (Signed)
Allergy Serum Extract Date Mixed: 07/11/15  KCW aware Vial: 2 Strength: 1:10 Here/Mail/Pick Up: here Mixed By: tbs Last OV: 01/23/14  Pending OV: 07/29/15

## 2015-07-17 ENCOUNTER — Ambulatory Visit (INDEPENDENT_AMBULATORY_CARE_PROVIDER_SITE_OTHER): Payer: BLUE CROSS/BLUE SHIELD | Admitting: *Deleted

## 2015-07-17 DIAGNOSIS — J309 Allergic rhinitis, unspecified: Secondary | ICD-10-CM | POA: Diagnosis not present

## 2015-07-24 ENCOUNTER — Ambulatory Visit (INDEPENDENT_AMBULATORY_CARE_PROVIDER_SITE_OTHER): Payer: BLUE CROSS/BLUE SHIELD | Admitting: *Deleted

## 2015-07-24 DIAGNOSIS — J309 Allergic rhinitis, unspecified: Secondary | ICD-10-CM

## 2015-07-25 ENCOUNTER — Other Ambulatory Visit: Payer: Self-pay | Admitting: *Deleted

## 2015-07-25 DIAGNOSIS — J209 Acute bronchitis, unspecified: Secondary | ICD-10-CM

## 2015-07-25 NOTE — Telephone Encounter (Signed)
Receive fax pt requesting refill on promethazine-codiene cough syrup. Faxed back denied need OV for refill...Ashley Larsen

## 2015-07-28 ENCOUNTER — Other Ambulatory Visit: Payer: 59

## 2015-07-29 ENCOUNTER — Encounter: Payer: Self-pay | Admitting: Internal Medicine

## 2015-07-29 ENCOUNTER — Ambulatory Visit (INDEPENDENT_AMBULATORY_CARE_PROVIDER_SITE_OTHER): Payer: BLUE CROSS/BLUE SHIELD | Admitting: Internal Medicine

## 2015-07-29 ENCOUNTER — Ambulatory Visit (INDEPENDENT_AMBULATORY_CARE_PROVIDER_SITE_OTHER): Payer: BLUE CROSS/BLUE SHIELD | Admitting: *Deleted

## 2015-07-29 VITALS — BP 118/80 | HR 80 | Ht 65.0 in | Wt 179.0 lb

## 2015-07-29 DIAGNOSIS — J309 Allergic rhinitis, unspecified: Secondary | ICD-10-CM

## 2015-07-29 DIAGNOSIS — I2699 Other pulmonary embolism without acute cor pulmonale: Secondary | ICD-10-CM | POA: Diagnosis not present

## 2015-07-29 DIAGNOSIS — J302 Other seasonal allergic rhinitis: Secondary | ICD-10-CM

## 2015-07-29 DIAGNOSIS — J3089 Other allergic rhinitis: Principal | ICD-10-CM

## 2015-07-29 MED ORDER — AZELASTINE-FLUTICASONE 137-50 MCG/ACT NA SUSP: 1.0000 | Freq: Every day | NASAL | Status: DC

## 2015-07-29 NOTE — Patient Instructions (Signed)
Sample and script to try Dymista nasal spray    1-2 puffs each nostril once daily at bedtime while needed  We can continue allergy vaccine through this year or stop any time, as discussed  Please call  If we can help

## 2015-07-29 NOTE — Assessment & Plan Note (Addendum)
We discussed plan to end allergy clinic at this office in a year. She can stop allergy vaccine at any time but may continue through this year than wait to see how she feels. Plan-try sample Dymista nasal spray

## 2015-07-29 NOTE — Progress Notes (Signed)
Subjective:    Patient ID: Ashley Larsen, female    DOB: 18-Dec-1950, 65 y.o.   MRN: [id number] HPI  10/20/10 - 60 yof smoker followed for COPD, tobacco use, allergic rhinitis, complicated by insomnia, GERD, HBP  Last here July 28, 2009-  Still smoking 6-7 cigarettes/ day. She had a friend who quit with hypnosis so she would like to try that. Wants flu shot.  Rhinitis in last 2 weeks with nasal congestion and pressure- not headache or green.  Denies dyspnea, chest pain, swelling. Husband has liver cancer.  Never needing her inhalers- would like to keep a rescue inhaler available   09/03/11- Dr Gwenette Greet The patient comes in today for an acute sick visit.  She is normally followed by Dr. Annamaria Boots for COPD.  A review of the chart shows that she has required multiple rounds of antibiotics this year so far, and unfortunately continues to smoke every day.  She denies noncompliance with her symbicort.  She notes a one-week history of worsening nasal congestion, postnasal drip, sinus pressure, and small-to-moderate quantities of purulent mucus from her nose.  She has also had a sore throat.  She denies any significant chest congestion, and feels that her breathing is at baseline.  12/05/12- 60 yof smoker followed for COPD, tobacco use, allergic rhinitis, complicated by insomnia, GERD, HBP FOLLOWS FOR: c/o nasal congestion-no color,sob with exertion,cough-clear,denies wheezing cp or tightness Says she can tell if she skips her allergy shot, continuing Allergy Vaccine 1:10 [facility]. Some nasal congestion and pressure in her years, mild cough at night. Rarely needs rescue inhaler. [facility] flu vaccine at [facility].  01/23/14- 43 yof smoker followed for COPD, tobacco use, allergic rhinitis, complicated by insomnia, GERD, HBP follows for: yearly rov.  pt has no breathing complaints.  tolerating allergy injections well.  Allergy vaccine 1:10 [facility] Eye Dr. treating since May for "watery" eyes with no diagnosis offered.  Nasal congestion. Right ear stuffy.Smoking less. CXR 12/05/12 IMPRESSION: No active cardiopulmonary disease. Electronically Signed  By: Donavan Burnet M.D. On: 12/05/2012 09:49  07/29/2015-65 year old female smoker followed for COPD, tobacco use, allergic rhinitis, complicated by insomnia, GERD, HBP, PE/ Xarelto Allergy Vaccine 1:10 [facility] FOLLOWS FOR:c/o sneezing,watery eyes x 1 wk. Cough--white.No sob Feels well at this visit. No bleeding problems on Xarelto since diagnosis of PE in February. Increased itching and watering of eyes and nose in the past week. CTa chest 04/09/2015 POS pulmonary embolism  ROS-see HPI Constitutional:   No-   weight loss, night sweats, fevers, chills, fatigue, lassitude. HEENT:   No-  headaches, difficulty swallowing, tooth/dental problems, sore throat,       No-  sneezing, itching, ear ache, +nasal congestion, post nasal drip,  CV:  No-   chest pain, orthopnea, PND, swelling in lower extremities, anasarca,  dizziness, palpitations Resp: No-   shortness of breath with exertion or at rest.              No-   productive cough,  No non-productive cough,  No- coughing up of blood.              No-   change in color of mucus.  No- wheezing.   Skin: No-   rash or lesions. GI:  No-   heartburn, indigestion, abdominal pain, nausea, vomiting,  GU: . MS:  No-   joint pain or swelling.   Neuro-     nothing unusual Psych:  No- change in mood or affect. No depression or anxiety.  No memory loss.  Objective:  OBJ- Physical Exam General- Alert, Oriented, Affect-appropriate, Distress- none acute Skin- rash-none, lesions- none, excoriation- none Lymphadenopathy- none Head- atraumatic            Eyes- Gross vision intact, PERRLA, conjunctivae and secretions clear, + Strabismus            Ears- Hearing, canals, TMs-normal            Nose- +turbinate edema, no-Septal dev, mucus, polyps, erosion, perforation             Throat- Mallampati IV thick tongue , mucosa  clear , drainage- none, tonsils- atrophic Neck- flexible , trachea midline, no stridor , thyroid nl, carotid no bruit Chest - symmetrical excursion , unlabored           Heart/CV- RRR , no murmur , no gallop  , no rub, nl s1 s2                           - JVD +1, edema- none, stasis changes- none, varices- none           Lung- clear to P&A, wheeze- none, cough- none , dullness-none, rub- none           Chest wall-  Abd- Br/ Gen/ Rectal- Not done, not indicated Extrem- cyanosis- none, clubbing, none, atrophy- none, strength- nl Neuro- grossly intact to observation    Assessment & Plan:

## 2015-07-29 NOTE — Assessment & Plan Note (Signed)
There is still some increased pressure in neck veins but I hear no rales and she says her chest is comfortable

## 2015-07-31 ENCOUNTER — Ambulatory Visit: Payer: 59 | Admitting: Endocrinology

## 2015-08-07 ENCOUNTER — Ambulatory Visit: Payer: BLUE CROSS/BLUE SHIELD

## 2015-08-08 ENCOUNTER — Encounter: Payer: Self-pay | Admitting: Internal Medicine

## 2015-08-08 ENCOUNTER — Ambulatory Visit (INDEPENDENT_AMBULATORY_CARE_PROVIDER_SITE_OTHER): Payer: BLUE CROSS/BLUE SHIELD | Admitting: Internal Medicine

## 2015-08-08 ENCOUNTER — Ambulatory Visit (INDEPENDENT_AMBULATORY_CARE_PROVIDER_SITE_OTHER): Payer: BLUE CROSS/BLUE SHIELD | Admitting: *Deleted

## 2015-08-08 VITALS — BP 120/7 | HR 78 | Wt 178.0 lb

## 2015-08-08 DIAGNOSIS — J4489 Other specified chronic obstructive pulmonary disease: Secondary | ICD-10-CM

## 2015-08-08 DIAGNOSIS — J309 Allergic rhinitis, unspecified: Secondary | ICD-10-CM

## 2015-08-08 DIAGNOSIS — E890 Postprocedural hypothyroidism: Secondary | ICD-10-CM

## 2015-08-08 DIAGNOSIS — M5442 Lumbago with sciatica, left side: Secondary | ICD-10-CM

## 2015-08-08 DIAGNOSIS — I10 Essential (primary) hypertension: Secondary | ICD-10-CM | POA: Diagnosis not present

## 2015-08-08 DIAGNOSIS — J209 Acute bronchitis, unspecified: Secondary | ICD-10-CM

## 2015-08-08 DIAGNOSIS — L910 Hypertrophic scar: Secondary | ICD-10-CM

## 2015-08-08 DIAGNOSIS — F17200 Nicotine dependence, unspecified, uncomplicated: Secondary | ICD-10-CM

## 2015-08-08 DIAGNOSIS — J449 Chronic obstructive pulmonary disease, unspecified: Secondary | ICD-10-CM | POA: Diagnosis not present

## 2015-08-08 DIAGNOSIS — I2699 Other pulmonary embolism without acute cor pulmonale: Secondary | ICD-10-CM

## 2015-08-08 DIAGNOSIS — M5441 Lumbago with sciatica, right side: Secondary | ICD-10-CM

## 2015-08-08 MED ORDER — TRIAMCINOLONE ACETONIDE 0.5 % EX OINT: 1.0000 "application " | TOPICAL_OINTMENT | Freq: Three times a day (TID) | CUTANEOUS | Status: DC

## 2015-08-08 MED ORDER — PROMETHAZINE-CODEINE 6.25-10 MG/5ML PO SYRP: ORAL_SOLUTION | ORAL | Status: DC

## 2015-08-08 MED ORDER — LOSARTAN POTASSIUM 100 MG PO TABS: 100.0000 mg | ORAL_TABLET | Freq: Every day | ORAL | Status: DC

## 2015-08-08 MED ORDER — METHYLPREDNISOLONE ACETATE 40 MG/ML IJ SUSP
40.0000 mg | Freq: Once | INTRAMUSCULAR | Status: AC
  Administered 2015-08-08: 40 mg via INTRALESIONAL

## 2015-08-08 MED ORDER — RIVAROXABAN 20 MG PO TABS: 20.0000 mg | ORAL_TABLET | Freq: Every day | ORAL | Status: DC

## 2015-08-08 NOTE — Progress Notes (Signed)
Subjective:  Patient ID: Ashley Larsen, female    DOB: [birth year]  Age: 65 y.o. MRN: [id number]  CC: No chief complaint on file.   HPI Troy Tunick presents for HTN, keloid scars - painful, HTN f/u  Outpatient Prescriptions Prior to Visit  Medication Sig Dispense Refill  . amLODipine (NORVASC) 5 MG tablet Take 1 tablet (5 mg total) by mouth daily. 90 tablet 3  . Azelastine-Fluticasone (DYMISTA) 137-50 MCG/ACT SUSP Place 1-2 puffs into the nose at bedtime. 1 Bottle prn  . fexofenadine (ALLEGRA) 180 MG tablet Take 1 tablet (180 mg total) by mouth daily as needed for allergies. 90 tablet 3  . fluticasone (FLONASE) 50 MCG/ACT nasal spray Place 2 sprays into both nostrils daily. 16 g 6  . HYDROcodone-acetaminophen (NORCO/VICODIN) 5-325 MG tablet Take 1 tablet by mouth every 8 (eight) hours as needed for severe pain. 90 tablet 0  . levothyroxine (SYNTHROID, LEVOTHROID) 88 MCG tablet Take 1 tablet (88 mcg total) by mouth daily. 30 tablet 3  . losartan (COZAAR) 100 MG tablet Take 1 tablet by mouth daily.  1  . NON FORMULARY Reported on 07/29/2015    . Polyethyl Glycol-Propyl Glycol (SYSTANE OP) Place 1 drop into both eyes daily as needed. Reported on 07/29/2015    . rivaroxaban (XARELTO) 20 MG TABS tablet Take 1 tablet (20 mg total) by mouth daily with supper. 30 tablet 3  . Selenium 100 MCG TABS 1 po bid for your eye condition 200 each 2   No facility-administered medications prior to visit.    ROS Review of Systems  Constitutional: Positive for fatigue. Negative for chills, activity change, appetite change and unexpected weight change.  HENT: Negative for congestion, mouth sores and sinus pressure.   Eyes: Negative for visual disturbance.  Respiratory: Negative for cough and chest tightness.   Gastrointestinal: Negative for nausea and abdominal pain.  Genitourinary: Negative for frequency, difficulty urinating and vaginal pain.  Musculoskeletal: Positive for back pain and arthralgias.  Negative for gait problem.  Skin: Negative for pallor and rash.  Neurological: Negative for dizziness, tremors, weakness, numbness and headaches.  Psychiatric/Behavioral: Positive for sleep disturbance. Negative for suicidal ideas and confusion.    Objective:  BP 120/7 mmHg  Pulse 78  Wt 178 lb (80.74 kg)  SpO2 94%  BP Readings from Last 3 Encounters:  08/08/15 120/7  07/29/15 118/80  07/11/15 160/90    Wt Readings from Last 3 Encounters:  08/08/15 178 lb (80.74 kg)  07/29/15 179 lb (81.194 kg)  07/11/15 179 lb (81.194 kg)    Physical Exam  Constitutional: She appears well-developed. No distress.  HENT:  Head: Normocephalic.  Right Ear: External ear normal.  Left Ear: External ear normal.  Nose: Nose normal.  Mouth/Throat: Oropharynx is clear and moist.  Eyes: Conjunctivae are normal. Pupils are equal, round, and reactive to light. Right eye exhibits no discharge. Left eye exhibits no discharge.  Neck: Normal range of motion. Neck supple. No JVD present. No tracheal deviation present. No thyromegaly present.  Cardiovascular: Normal rate, regular rhythm and normal heart sounds.   Pulmonary/Chest: No stridor. No respiratory distress. She has no wheezes.  Abdominal: Soft. Bowel sounds are normal. She exhibits no distension and no mass. There is no tenderness. There is no rebound and no guarding.  Musculoskeletal: She exhibits tenderness. She exhibits no edema.  Lymphadenopathy:    She has no cervical adenopathy.  Neurological: She displays normal reflexes. No cranial nerve deficit. She exhibits normal muscle tone. Coordination normal.  Skin: No rash noted. No erythema.  Psychiatric: She has a normal mood and affect. Her behavior is normal. Judgment and thought content normal.  R post shoulder large keloids x2 - tender  Lab Results  Component Value Date   WBC 6.0 04/12/2015   HGB 11.7* 04/12/2015   HCT 35.5* 04/12/2015   PLT 188 04/12/2015   GLUCOSE 90 05/02/2015    CHOL 113 01/31/2013   TRIG 89.0 01/31/2013   HDL 34.80* 01/31/2013   LDLDIRECT 185.7 01/19/2012   LDLCALC 60 01/31/2013   ALT 14 04/10/2015   AST 14* 04/10/2015   NA 142 05/02/2015   K 3.5 05/02/2015   CL 104 05/02/2015   CREATININE 0.88 05/02/2015   BUN 11 05/02/2015   CO2 31 05/02/2015   TSH 2.55 06/23/2015   INR 0.9 01/10/2007   HGBA1C 6.0 08/26/2008   Procedure: steroid injection of a keloid scar Indication: keloid x2 R shoulder Risks (unsuccessful procedure, bleeding, infection, atrophy) as well as benefits were discussed.  The patient was placed in a decubitus position. After sterile prep the scar was injected with 20 mg DepoMedrol and 1 cc of Lidocaine mix. 27g needle was used. Tolerated well. Complications: none. Bandaid applied.   Dg Chest 2 View  04/09/2015  CLINICAL DATA:  Shortness of breath beginning yesterday.  Fatigue. EXAM: CHEST  2 VIEW COMPARISON:  01/23/2014 FINDINGS: The heart size and mediastinal contours are within normal limits. Both lungs are clear. No evidence of pneumothorax or pleural effusion. Mild thoracic spine degenerative changes noted. IMPRESSION: No active cardiopulmonary disease. Electronically Signed   By: Earle Gell M.D.   On: 04/09/2015 17:24   Ct Angio Chest Pe W/cm &/or Wo Cm  04/09/2015  CLINICAL DATA:  Shortness of breath for several weeks. Worse the past 2 days. Tachycardia. Hypotension. EXAM: CT ANGIOGRAPHY CHEST WITH CONTRAST TECHNIQUE: Multidetector CT imaging of the chest was performed using the standard protocol during bolus administration of intravenous contrast. Multiplanar CT image reconstructions and MIPs were obtained to evaluate the vascular anatomy. CONTRAST:  62mL OMNIPAQUE IOHEXOL 350 MG/ML SOLN COMPARISON:  Chest radiographs obtained earlier today. Chest CTA dated 04/18/2009. FINDINGS: Mediastinum/Lymph Nodes: Central right upper lobe pulmonary arterial filling defects. The right ventricular to left ventricular ratio is 1.0. No  enlarged lymph nodes. Lungs/Pleura: No pulmonary mass, infiltrate, or effusion. Upper abdomen: No acute findings. Musculoskeletal: Thoracic and upper lumbar spine degenerative changes. Review of the MIP images confirms the above findings. IMPRESSION: Central right upper lobe pulmonary emboli with CT evidence of right heart strain (RV/LV Ratio = 1.0) consistent with at least submassive (intermediate risk) PE. The presence of right heart strain has been associated with an increased risk of morbidity and mortality. Please activate Code PE by paging [phone]. Critical Value/emergent results were called by telephone at the time of interpretation on 04/09/2015 at 7:57 pm to Dr. Theotis Burrow , who verbally acknowledged these results. Electronically Signed   By: Claudie Revering M.D.   On: 04/09/2015 19:58    Assessment & Plan:   Diagnoses and all orders for this visit:  Acute bronchitis, unspecified organism -     promethazine-codeine (PHENERGAN WITH CODEINE) 6.25-10 MG/5ML syrup; take 5 milliliters by mouth every 6 hours if needed for cough  Other orders -     losartan (COZAAR) 100 MG tablet; Take 1 tablet (100 mg total) by mouth daily. -     rivaroxaban (XARELTO) 20 MG TABS tablet; Take 1 tablet (20 mg total) by mouth daily with  supper.   I am having Ms. Kendricks maintain her Selenium, NON FORMULARY, Polyethyl Glycol-Propyl Glycol (SYSTANE OP), rivaroxaban, losartan, fexofenadine, fluticasone, levothyroxine, HYDROcodone-acetaminophen, amLODipine, and Azelastine-Fluticasone.  No orders of the defined types were placed in this encounter.     Follow-up: No Follow-up on file.  Walker Kehr, MD

## 2015-08-08 NOTE — Assessment & Plan Note (Signed)
Norco prn  Potential benefits of a long term opioids use as well as potential risks (i.e. addiction risk, apnea etc) and complications (i.e. Somnolence, constipation and others) were explained to the patient and were aknowledged. 

## 2015-08-08 NOTE — Assessment & Plan Note (Signed)
Trying to quit smoking. 

## 2015-08-08 NOTE — Assessment & Plan Note (Signed)
On Xarelto 

## 2015-08-08 NOTE — Assessment & Plan Note (Signed)
R post shoulder large keloids x2 - tender Will inject - see procedure

## 2015-08-08 NOTE — Progress Notes (Signed)
Pre visit review using our clinic review tool, if applicable. No additional management support is needed unless otherwise documented below in the visit note. 

## 2015-08-08 NOTE — Assessment & Plan Note (Signed)
Losartan 

## 2015-08-08 NOTE — Assessment & Plan Note (Signed)
On Levothroid 

## 2015-08-08 NOTE — Assessment & Plan Note (Signed)
Smoking very little - discussed

## 2015-08-14 ENCOUNTER — Ambulatory Visit (INDEPENDENT_AMBULATORY_CARE_PROVIDER_SITE_OTHER): Payer: BLUE CROSS/BLUE SHIELD | Admitting: *Deleted

## 2015-08-14 DIAGNOSIS — J309 Allergic rhinitis, unspecified: Secondary | ICD-10-CM

## 2015-08-21 ENCOUNTER — Ambulatory Visit (INDEPENDENT_AMBULATORY_CARE_PROVIDER_SITE_OTHER): Payer: BLUE CROSS/BLUE SHIELD | Admitting: *Deleted

## 2015-08-21 DIAGNOSIS — J309 Allergic rhinitis, unspecified: Secondary | ICD-10-CM | POA: Diagnosis not present

## 2015-08-28 ENCOUNTER — Ambulatory Visit (INDEPENDENT_AMBULATORY_CARE_PROVIDER_SITE_OTHER): Payer: BLUE CROSS/BLUE SHIELD | Admitting: *Deleted

## 2015-08-28 DIAGNOSIS — J309 Allergic rhinitis, unspecified: Secondary | ICD-10-CM | POA: Diagnosis not present

## 2015-09-04 ENCOUNTER — Ambulatory Visit (INDEPENDENT_AMBULATORY_CARE_PROVIDER_SITE_OTHER): Payer: BLUE CROSS/BLUE SHIELD | Admitting: *Deleted

## 2015-09-04 DIAGNOSIS — J309 Allergic rhinitis, unspecified: Secondary | ICD-10-CM

## 2015-09-11 ENCOUNTER — Ambulatory Visit (INDEPENDENT_AMBULATORY_CARE_PROVIDER_SITE_OTHER): Payer: BLUE CROSS/BLUE SHIELD | Admitting: *Deleted

## 2015-09-11 DIAGNOSIS — J309 Allergic rhinitis, unspecified: Secondary | ICD-10-CM

## 2015-09-12 ENCOUNTER — Other Ambulatory Visit: Payer: Self-pay | Admitting: Endocrinology

## 2015-09-12 NOTE — Telephone Encounter (Signed)
Refill levothyroxine (SYNTHROID, LEVOTHROID) 88 MCG tablet.   [id number], [place] [address] [phone] (Phone) [phone] (Fax)

## 2015-09-16 ENCOUNTER — Other Ambulatory Visit: Payer: Self-pay

## 2015-09-16 NOTE — Telephone Encounter (Signed)
Rx refill was already completed last week.

## 2015-09-18 ENCOUNTER — Ambulatory Visit (INDEPENDENT_AMBULATORY_CARE_PROVIDER_SITE_OTHER): Payer: BLUE CROSS/BLUE SHIELD | Admitting: *Deleted

## 2015-09-18 DIAGNOSIS — J309 Allergic rhinitis, unspecified: Secondary | ICD-10-CM | POA: Diagnosis not present

## 2015-09-25 ENCOUNTER — Ambulatory Visit (INDEPENDENT_AMBULATORY_CARE_PROVIDER_SITE_OTHER): Payer: BLUE CROSS/BLUE SHIELD | Admitting: *Deleted

## 2015-09-25 DIAGNOSIS — J309 Allergic rhinitis, unspecified: Secondary | ICD-10-CM | POA: Diagnosis not present

## 2015-10-02 ENCOUNTER — Ambulatory Visit (INDEPENDENT_AMBULATORY_CARE_PROVIDER_SITE_OTHER): Payer: BLUE CROSS/BLUE SHIELD | Admitting: *Deleted

## 2015-10-02 DIAGNOSIS — J309 Allergic rhinitis, unspecified: Secondary | ICD-10-CM | POA: Diagnosis not present

## 2015-10-06 ENCOUNTER — Ambulatory Visit (INDEPENDENT_AMBULATORY_CARE_PROVIDER_SITE_OTHER): Payer: BLUE CROSS/BLUE SHIELD | Admitting: Internal Medicine

## 2015-10-06 ENCOUNTER — Ambulatory Visit: Payer: BLUE CROSS/BLUE SHIELD | Admitting: Internal Medicine

## 2015-10-06 ENCOUNTER — Ambulatory Visit (INDEPENDENT_AMBULATORY_CARE_PROVIDER_SITE_OTHER): Payer: BLUE CROSS/BLUE SHIELD | Admitting: *Deleted

## 2015-10-06 ENCOUNTER — Encounter: Payer: Self-pay | Admitting: Internal Medicine

## 2015-10-06 VITALS — BP 120/72 | HR 88 | Temp 98.4°F | Wt 181.0 lb

## 2015-10-06 DIAGNOSIS — E0500 Thyrotoxicosis with diffuse goiter without thyrotoxic crisis or storm: Secondary | ICD-10-CM

## 2015-10-06 DIAGNOSIS — E890 Postprocedural hypothyroidism: Secondary | ICD-10-CM

## 2015-10-06 DIAGNOSIS — M5441 Lumbago with sciatica, right side: Secondary | ICD-10-CM

## 2015-10-06 DIAGNOSIS — J309 Allergic rhinitis, unspecified: Secondary | ICD-10-CM | POA: Diagnosis not present

## 2015-10-06 DIAGNOSIS — I2699 Other pulmonary embolism without acute cor pulmonale: Secondary | ICD-10-CM

## 2015-10-06 DIAGNOSIS — J302 Other seasonal allergic rhinitis: Secondary | ICD-10-CM

## 2015-10-06 DIAGNOSIS — I10 Essential (primary) hypertension: Secondary | ICD-10-CM | POA: Diagnosis not present

## 2015-10-06 DIAGNOSIS — Z23 Encounter for immunization: Secondary | ICD-10-CM | POA: Diagnosis not present

## 2015-10-06 DIAGNOSIS — M5442 Lumbago with sciatica, left side: Secondary | ICD-10-CM

## 2015-10-06 DIAGNOSIS — K219 Gastro-esophageal reflux disease without esophagitis: Secondary | ICD-10-CM

## 2015-10-06 DIAGNOSIS — J3089 Other allergic rhinitis: Secondary | ICD-10-CM

## 2015-10-06 DIAGNOSIS — H05839 Thyroid orbitopathy, unspecified orbit: Secondary | ICD-10-CM

## 2015-10-06 MED ORDER — HYDROCODONE-ACETAMINOPHEN 5-325 MG PO TABS: 1.0000 | ORAL_TABLET | Freq: Three times a day (TID) | ORAL | 0 refills | Status: DC | PRN

## 2015-10-06 NOTE — Assessment & Plan Note (Signed)
Allegra and Flonase prn

## 2015-10-06 NOTE — Assessment & Plan Note (Signed)
Norco prn  Potential benefits of a long term opioids use as well as potential risks (i.e. addiction risk, apnea etc) and complications (i.e. Somnolence, constipation and others) were explained to the patient and were aknowledged. 

## 2015-10-06 NOTE — Assessment & Plan Note (Addendum)
Losartan, Norvasc 

## 2015-10-06 NOTE — Progress Notes (Signed)
Pre visit review using our clinic review tool, if applicable. No additional management support is needed unless otherwise documented below in the visit note. 

## 2015-10-06 NOTE — Progress Notes (Signed)
Subjective:  Patient ID: Ashley Larsen, female    DOB: Sep 19, 1950  Age: 65 y.o. MRN: [id number]  CC: No chief complaint on file.   HPI Ashley Larsen presents for HTN, hypothyroidism, LBP/OA  Outpatient Medications Prior to Visit  Medication Sig Dispense Refill  . amLODipine (NORVASC) 5 MG tablet Take 1 tablet (5 mg total) by mouth daily. 90 tablet 3  . Azelastine-Fluticasone (DYMISTA) 137-50 MCG/ACT SUSP Place 1-2 puffs into the nose at bedtime. 1 Bottle prn  . fexofenadine (ALLEGRA) 180 MG tablet Take 1 tablet (180 mg total) by mouth daily as needed for allergies. 90 tablet 3  . fluticasone (FLONASE) 50 MCG/ACT nasal spray Place 2 sprays into both nostrils daily. 16 g 6  . HYDROcodone-acetaminophen (NORCO/VICODIN) 5-325 MG tablet Take 1 tablet by mouth every 8 (eight) hours as needed for severe pain. 90 tablet 0  . levothyroxine (SYNTHROID, LEVOTHROID) 88 MCG tablet take 1 tablet by mouth once daily 30 tablet 2  . losartan (COZAAR) 100 MG tablet Take 1 tablet (100 mg total) by mouth daily. 30 tablet 11  . NON FORMULARY Reported on 07/29/2015    . Polyethyl Glycol-Propyl Glycol (SYSTANE OP) Place 1 drop into both eyes daily as needed. Reported on 07/29/2015    . promethazine-codeine (PHENERGAN WITH CODEINE) 6.25-10 MG/5ML syrup take 5 milliliters by mouth every 6 hours if needed for cough 300 mL 0  . rivaroxaban (XARELTO) 20 MG TABS tablet Take 1 tablet (20 mg total) by mouth daily with supper. 30 tablet 5  . Selenium 100 MCG TABS 1 po bid for your eye condition 200 each 2  . triamcinolone ointment (KENALOG) 0.5 % Apply 1 application topically 3 (three) times daily. 30 g 1   No facility-administered medications prior to visit.     ROS Review of Systems  Constitutional: Positive for fatigue. Negative for activity change, appetite change, chills and unexpected weight change.  HENT: Positive for rhinorrhea. Negative for congestion, mouth sores and sinus pressure.   Eyes: Negative for  visual disturbance.  Respiratory: Negative for cough and chest tightness.   Gastrointestinal: Negative for abdominal pain and nausea.  Genitourinary: Negative for difficulty urinating, frequency and vaginal pain.  Musculoskeletal: Positive for arthralgias and back pain. Negative for gait problem.  Skin: Negative for pallor and rash.  Neurological: Negative for dizziness, tremors, weakness, numbness and headaches.  Psychiatric/Behavioral: Negative for confusion and sleep disturbance.    Objective:  BP 120/72   Pulse 88   Temp 98.4 F (36.9 C) (Oral)   Wt 181 lb (82.1 kg)   SpO2 96%   BMI 30.12 kg/m   BP Readings from Last 3 Encounters:  10/06/15 120/72  08/08/15 (!) 120/7  07/29/15 118/80    Wt Readings from Last 3 Encounters:  10/06/15 181 lb (82.1 kg)  08/08/15 178 lb (80.7 kg)  07/29/15 179 lb (81.2 kg)    Physical Exam  Constitutional: She appears well-developed. No distress.  HENT:  Head: Normocephalic.  Right Ear: External ear normal.  Left Ear: External ear normal.  Nose: Nose normal.  Mouth/Throat: Oropharynx is clear and moist.  Eyes: Conjunctivae are normal. Pupils are equal, round, and reactive to light. Right eye exhibits no discharge. Left eye exhibits no discharge.  Neck: Normal range of motion. Neck supple. No JVD present. No tracheal deviation present. No thyromegaly present.  Cardiovascular: Normal rate, regular rhythm and normal heart sounds.   Pulmonary/Chest: No stridor. No respiratory distress. She has no wheezes.  Abdominal: Soft. Bowel  sounds are normal. She exhibits no distension and no mass. There is no tenderness. There is no rebound and no guarding.  Musculoskeletal: She exhibits tenderness. She exhibits no edema.  Lymphadenopathy:    She has no cervical adenopathy.  Neurological: She displays normal reflexes. No cranial nerve deficit. She exhibits normal muscle tone.  Skin: No rash noted. No erythema.  Psychiatric: She has a normal mood and  affect. Her behavior is normal. Judgment and thought content normal.    Lab Results  Component Value Date   WBC 6.0 04/12/2015   HGB 11.7 (L) 04/12/2015   HCT 35.5 (L) 04/12/2015   PLT 188 04/12/2015   GLUCOSE 90 05/02/2015   CHOL 113 01/31/2013   TRIG 89.0 01/31/2013   HDL 34.80 (L) 01/31/2013   LDLDIRECT 185.7 01/19/2012   LDLCALC 60 01/31/2013   ALT 14 04/10/2015   AST 14 (L) 04/10/2015   NA 142 05/02/2015   K 3.5 05/02/2015   CL 104 05/02/2015   CREATININE 0.88 05/02/2015   BUN 11 05/02/2015   CO2 31 05/02/2015   TSH 2.55 06/23/2015   INR 0.9 01/10/2007   HGBA1C 6.0 08/26/2008    Dg Chest 2 View  Result Date: 04/09/2015 CLINICAL DATA:  Shortness of breath beginning yesterday.  Fatigue. EXAM: CHEST  2 VIEW COMPARISON:  01/23/2014 FINDINGS: The heart size and mediastinal contours are within normal limits. Both lungs are clear. No evidence of pneumothorax or pleural effusion. Mild thoracic spine degenerative changes noted. IMPRESSION: No active cardiopulmonary disease. Electronically Signed   By: Earle Gell M.D.   On: 04/09/2015 17:24   Ct Angio Chest Pe W/cm &/or Wo Cm  Result Date: 04/09/2015 CLINICAL DATA:  Shortness of breath for several weeks. Worse the past 2 days. Tachycardia. Hypotension. EXAM: CT ANGIOGRAPHY CHEST WITH CONTRAST TECHNIQUE: Multidetector CT imaging of the chest was performed using the standard protocol during bolus administration of intravenous contrast. Multiplanar CT image reconstructions and MIPs were obtained to evaluate the vascular anatomy. CONTRAST:  82mL OMNIPAQUE IOHEXOL 350 MG/ML SOLN COMPARISON:  Chest radiographs obtained earlier today. Chest CTA dated 04/18/2009. FINDINGS: Mediastinum/Lymph Nodes: Central right upper lobe pulmonary arterial filling defects. The right ventricular to left ventricular ratio is 1.0. No enlarged lymph nodes. Lungs/Pleura: No pulmonary mass, infiltrate, or effusion. Upper abdomen: No acute findings. Musculoskeletal:  Thoracic and upper lumbar spine degenerative changes. Review of the MIP images confirms the above findings. IMPRESSION: Central right upper lobe pulmonary emboli with CT evidence of right heart strain (RV/LV Ratio = 1.0) consistent with at least submassive (intermediate risk) PE. The presence of right heart strain has been associated with an increased risk of morbidity and mortality. Please activate Code PE by paging [phone]. Critical Value/emergent results were called by telephone at the time of interpretation on 04/09/2015 at 7:57 pm to Dr. Theotis Burrow , who verbally acknowledged these results. Electronically Signed   By: Claudie Revering M.D.   On: 04/09/2015 19:58    Assessment & Plan:   There are no diagnoses linked to this encounter. I am having Ashley Larsen maintain her Selenium, NON FORMULARY, Polyethyl Glycol-Propyl Glycol (SYSTANE OP), fexofenadine, fluticasone, HYDROcodone-acetaminophen, amLODipine, Azelastine-Fluticasone, losartan, rivaroxaban, promethazine-codeine, triamcinolone ointment, and levothyroxine.  No orders of the defined types were placed in this encounter.    Follow-up: No Follow-up on file.  Walker Kehr, MD

## 2015-10-06 NOTE — Assessment & Plan Note (Addendum)
Seeing eye doctors at [facility] Surgery pending

## 2015-10-06 NOTE — Assessment & Plan Note (Signed)
On Levothroid 

## 2015-10-06 NOTE — Assessment & Plan Note (Signed)
Not on Rx 

## 2015-10-06 NOTE — Assessment & Plan Note (Signed)
On Xarelto 

## 2015-10-07 ENCOUNTER — Other Ambulatory Visit (INDEPENDENT_AMBULATORY_CARE_PROVIDER_SITE_OTHER): Payer: BLUE CROSS/BLUE SHIELD

## 2015-10-07 DIAGNOSIS — I10 Essential (primary) hypertension: Secondary | ICD-10-CM

## 2015-10-07 DIAGNOSIS — M5442 Lumbago with sciatica, left side: Secondary | ICD-10-CM

## 2015-10-07 DIAGNOSIS — E0500 Thyrotoxicosis with diffuse goiter without thyrotoxic crisis or storm: Secondary | ICD-10-CM

## 2015-10-07 DIAGNOSIS — H05839 Thyroid orbitopathy, unspecified orbit: Secondary | ICD-10-CM

## 2015-10-07 DIAGNOSIS — M5441 Lumbago with sciatica, right side: Secondary | ICD-10-CM | POA: Diagnosis not present

## 2015-10-07 DIAGNOSIS — E890 Postprocedural hypothyroidism: Secondary | ICD-10-CM

## 2015-10-07 DIAGNOSIS — K219 Gastro-esophageal reflux disease without esophagitis: Secondary | ICD-10-CM

## 2015-10-07 DIAGNOSIS — I2699 Other pulmonary embolism without acute cor pulmonale: Secondary | ICD-10-CM | POA: Diagnosis not present

## 2015-10-07 LAB — HEPATIC FUNCTION PANEL
ALT: 23 U/L (ref 0–35)
AST: 18 U/L (ref 0–37)
Albumin: 4.3 g/dL (ref 3.5–5.2)
Alkaline Phosphatase: 72 U/L (ref 39–117)
BILIRUBIN DIRECT: 0.1 mg/dL (ref 0.0–0.3)
BILIRUBIN TOTAL: 0.6 mg/dL (ref 0.2–1.2)
Total Protein: 7.7 g/dL (ref 6.0–8.3)

## 2015-10-07 LAB — CBC WITH DIFFERENTIAL/PLATELET
BASOS PCT: 0.3 % (ref 0.0–3.0)
Basophils Absolute: 0 10*3/uL (ref 0.0–0.1)
EOS PCT: 2.6 % (ref 0.0–5.0)
Eosinophils Absolute: 0.1 10*3/uL (ref 0.0–0.7)
HEMATOCRIT: 38.4 % (ref 36.0–46.0)
HEMOGLOBIN: 13.3 g/dL (ref 12.0–15.0)
LYMPHS PCT: 32.4 % (ref 12.0–46.0)
Lymphs Abs: 1.8 10*3/uL (ref 0.7–4.0)
MCHC: 34.6 g/dL (ref 30.0–36.0)
MCV: 86.6 fl (ref 78.0–100.0)
MONOS PCT: 7.1 % (ref 3.0–12.0)
Monocytes Absolute: 0.4 10*3/uL (ref 0.1–1.0)
NEUTROS ABS: 3.1 10*3/uL (ref 1.4–7.7)
Neutrophils Relative %: 57.6 % (ref 43.0–77.0)
Platelets: 241 10*3/uL (ref 150.0–400.0)
RBC: 4.43 Mil/uL (ref 3.87–5.11)
RDW: 14.3 % (ref 11.5–15.5)
WBC: 5.4 10*3/uL (ref 4.0–10.5)

## 2015-10-07 LAB — URINALYSIS
BILIRUBIN URINE: NEGATIVE
Ketones, ur: NEGATIVE
LEUKOCYTES UA: NEGATIVE
NITRITE: NEGATIVE
Specific Gravity, Urine: 1.015 (ref 1.000–1.030)
Total Protein, Urine: NEGATIVE
UROBILINOGEN UA: 0.2 (ref 0.0–1.0)
Urine Glucose: NEGATIVE
pH: 6 (ref 5.0–8.0)

## 2015-10-07 LAB — HEPATITIS C ANTIBODY: HCV AB: NEGATIVE

## 2015-10-07 LAB — LIPID PANEL
CHOLESTEROL: 228 mg/dL — AB (ref 0–200)
HDL: 48.4 mg/dL (ref >39.00)
LDL CALC: 157 mg/dL — AB (ref 0–99)
NonHDL: 179.97
TRIGLYCERIDES: 114 mg/dL (ref 0.0–149.0)
Total CHOL/HDL Ratio: 5
VLDL: 22.8 mg/dL (ref 0.0–40.0)

## 2015-10-07 LAB — SEDIMENTATION RATE: SED RATE: 32 mm/h — AB (ref 0–30)

## 2015-10-07 LAB — TSH: TSH: 2.71 u[IU]/mL (ref 0.35–4.50)

## 2015-10-07 LAB — BASIC METABOLIC PANEL
BUN: 12 mg/dL (ref 6–23)
CHLORIDE: 106 meq/L (ref 96–112)
CO2: 29 meq/L (ref 19–32)
Calcium: 9.7 mg/dL (ref 8.4–10.5)
Creatinine, Ser: 0.92 mg/dL (ref 0.40–1.20)
GFR: 79.24 mL/min (ref >60.00)
Glucose, Bld: 92 mg/dL (ref 70–99)
POTASSIUM: 3.9 meq/L (ref 3.5–5.1)
SODIUM: 142 meq/L (ref 135–145)

## 2015-10-07 LAB — PROTIME-INR
INR: 1.8 ratio — ABNORMAL HIGH (ref 0.8–1.0)
Prothrombin Time: 19.4 s — ABNORMAL HIGH (ref 9.6–13.1)

## 2015-10-14 ENCOUNTER — Ambulatory Visit (INDEPENDENT_AMBULATORY_CARE_PROVIDER_SITE_OTHER): Payer: BLUE CROSS/BLUE SHIELD | Admitting: *Deleted

## 2015-10-14 DIAGNOSIS — J309 Allergic rhinitis, unspecified: Secondary | ICD-10-CM | POA: Diagnosis not present

## 2015-10-23 ENCOUNTER — Ambulatory Visit (INDEPENDENT_AMBULATORY_CARE_PROVIDER_SITE_OTHER): Payer: BLUE CROSS/BLUE SHIELD | Admitting: *Deleted

## 2015-10-23 DIAGNOSIS — J309 Allergic rhinitis, unspecified: Secondary | ICD-10-CM

## 2015-10-28 ENCOUNTER — Ambulatory Visit (INDEPENDENT_AMBULATORY_CARE_PROVIDER_SITE_OTHER): Payer: BLUE CROSS/BLUE SHIELD | Admitting: *Deleted

## 2015-10-28 DIAGNOSIS — J309 Allergic rhinitis, unspecified: Secondary | ICD-10-CM | POA: Diagnosis not present

## 2015-10-30 DIAGNOSIS — Z9889 Other specified postprocedural states: Secondary | ICD-10-CM | POA: Insufficient documentation

## 2015-11-21 ENCOUNTER — Ambulatory Visit (INDEPENDENT_AMBULATORY_CARE_PROVIDER_SITE_OTHER): Payer: BLUE CROSS/BLUE SHIELD | Admitting: *Deleted

## 2015-11-21 DIAGNOSIS — J309 Allergic rhinitis, unspecified: Secondary | ICD-10-CM | POA: Diagnosis not present

## 2015-11-27 ENCOUNTER — Ambulatory Visit (INDEPENDENT_AMBULATORY_CARE_PROVIDER_SITE_OTHER): Payer: BLUE CROSS/BLUE SHIELD | Admitting: *Deleted

## 2015-11-27 DIAGNOSIS — J309 Allergic rhinitis, unspecified: Secondary | ICD-10-CM | POA: Diagnosis not present

## 2015-11-28 ENCOUNTER — Telehealth: Payer: Self-pay

## 2015-11-28 DIAGNOSIS — J309 Allergic rhinitis, unspecified: Secondary | ICD-10-CM | POA: Diagnosis not present

## 2015-11-28 NOTE — Telephone Encounter (Signed)
Allergy Serum Extract Date Mixed: 11/28/15 Vial: 2 Strength: 1:10 Here/Mail/Pick Up: here Mixed By: TBS Last OV: 07/29/15 Pending OV: N/A

## 2015-12-12 ENCOUNTER — Ambulatory Visit (INDEPENDENT_AMBULATORY_CARE_PROVIDER_SITE_OTHER): Payer: BLUE CROSS/BLUE SHIELD | Admitting: *Deleted

## 2015-12-12 DIAGNOSIS — J309 Allergic rhinitis, unspecified: Secondary | ICD-10-CM

## 2015-12-13 ENCOUNTER — Other Ambulatory Visit: Payer: Self-pay | Admitting: Endocrinology

## 2015-12-18 ENCOUNTER — Ambulatory Visit (INDEPENDENT_AMBULATORY_CARE_PROVIDER_SITE_OTHER): Payer: BLUE CROSS/BLUE SHIELD | Admitting: *Deleted

## 2015-12-18 ENCOUNTER — Ambulatory Visit (INDEPENDENT_AMBULATORY_CARE_PROVIDER_SITE_OTHER): Payer: BLUE CROSS/BLUE SHIELD

## 2015-12-18 DIAGNOSIS — Z23 Encounter for immunization: Secondary | ICD-10-CM

## 2015-12-18 DIAGNOSIS — J309 Allergic rhinitis, unspecified: Secondary | ICD-10-CM | POA: Diagnosis not present

## 2015-12-23 ENCOUNTER — Ambulatory Visit (INDEPENDENT_AMBULATORY_CARE_PROVIDER_SITE_OTHER): Payer: BLUE CROSS/BLUE SHIELD | Admitting: *Deleted

## 2015-12-23 ENCOUNTER — Other Ambulatory Visit (INDEPENDENT_AMBULATORY_CARE_PROVIDER_SITE_OTHER): Payer: BLUE CROSS/BLUE SHIELD

## 2015-12-23 DIAGNOSIS — J309 Allergic rhinitis, unspecified: Secondary | ICD-10-CM | POA: Diagnosis not present

## 2015-12-23 DIAGNOSIS — E890 Postprocedural hypothyroidism: Secondary | ICD-10-CM

## 2015-12-23 LAB — T4, FREE: FREE T4: 0.88 ng/dL (ref 0.60–1.60)

## 2015-12-23 LAB — TSH: TSH: 4.37 u[IU]/mL (ref 0.35–4.50)

## 2015-12-26 ENCOUNTER — Ambulatory Visit (INDEPENDENT_AMBULATORY_CARE_PROVIDER_SITE_OTHER): Payer: BLUE CROSS/BLUE SHIELD | Admitting: Endocrinology

## 2015-12-26 ENCOUNTER — Encounter: Payer: Self-pay | Admitting: Endocrinology

## 2015-12-26 VITALS — BP 128/78 | HR 90 | Ht 65.0 in | Wt 180.0 lb

## 2015-12-26 DIAGNOSIS — E890 Postprocedural hypothyroidism: Secondary | ICD-10-CM | POA: Diagnosis not present

## 2015-12-26 NOTE — Patient Instructions (Signed)
Take extra 1/2 pill on Sundays

## 2015-12-26 NOTE — Progress Notes (Signed)
Patient ID: Ashley Larsen, female   DOB: 18-Oct-1950, 65 y.o.   MRN: [id number]   Reason for Appointment:  Post ablative hypothyroidism, followup   History of Present Illness:   She had hyperthyroidism first diagnosed in 01/2013 and treated with radioactive iodine on 05/03/13 with 12 mCi She became hypothyroid subsequently and in 6/15 with a free T4 level of 0.57 she was started on 112 mcg Synthroid  Recent history: Her dose was reduced to 88 mcg in 07/2013 because of relatively high free T4 of 1.4 and low TSH  Subsequently this was reduced to 75 However since her TSH was high in 3/17 associated with fatigue for a couple of months she is back on 88 g  In follow-up today she is not complaining of any unusual fatigue, no new cold intolerance and no change in weight  She has been regular with her Synthroid dose before breakfast and has not taken any new vitamins or calcium   Wt Readings from Last 3 Encounters:  12/26/15 180 lb (81.6 kg)  10/06/15 181 lb (82.1 kg)  08/08/15 178 lb (80.7 kg)    Her TSH which has been consistent is high normal now  Labs:   Lab Results  Component Value Date   TSH 4.37 12/23/2015   TSH 2.71 10/07/2015   TSH 2.55 06/23/2015   FREET4 0.88 12/23/2015   FREET4 0.91 06/23/2015   FREET4 0.85 04/09/2015        OPHTHALMOPATHY:  She has Done better with her diplopia with strabismus surgery done at [facility] Has less swelling of the eyes also     Medication List       Accurate as of 12/26/15 11:03 AM. Always use your most recent med list.          amLODipine 5 MG tablet Commonly known as:  NORVASC Take 1 tablet (5 mg total) by mouth daily.   fexofenadine 180 MG tablet Commonly known as:  ALLEGRA Take 1 tablet (180 mg total) by mouth daily as needed for allergies.   fluticasone 50 MCG/ACT nasal spray Commonly known as:  FLONASE Place 2 sprays into  both nostrils daily.   HYDROcodone-acetaminophen 5-325 MG tablet Commonly known as:  NORCO/VICODIN Take 1 tablet by mouth every 8 (eight) hours as needed for severe pain.   levothyroxine 88 MCG tablet Commonly known as:  SYNTHROID, LEVOTHROID take 1 tablet by mouth once daily   losartan 100 MG tablet Commonly known as:  COZAAR Take 1 tablet (100 mg total) by mouth daily.   NON FORMULARY Reported on 07/29/2015   promethazine-codeine 6.25-10 MG/5ML syrup Commonly known as:  PHENERGAN with CODEINE take 5 milliliters by mouth every 6 hours if needed for cough   rivaroxaban 20 MG Tabs tablet Commonly known as:  XARELTO Take 1 tablet (20 mg total) by mouth daily with supper.   Selenium 100 MCG Tabs 1 po bid for your eye condition   SYSTANE OP Place 1 drop into both eyes daily  as needed. Reported on 07/29/2015   triamcinolone ointment 0.5 % Commonly known as:  KENALOG Apply 1 application topically 3 (three) times daily.           Past Medical History:  Diagnosis Date  . Allergic rhinitis   . Alopecia   . Asthma   . Cataract    bilateral cateracts  . COPD (chronic obstructive pulmonary disease) ([place])   . Diverticulosis   . GERD (gastroesophageal reflux disease)   . Hypertension   . Hyperthyroidism   . IBS (irritable bowel syndrome)   . Insomnia   . Ovarian cyst   . Paresthesia     Past Surgical History:  Procedure Laterality Date  . COLONOSCOPY    . KNEE ARTHROSCOPY     Left  . OOPHORECTOMY     Left  . ROTATOR CUFF REPAIR    . TUBAL LIGATION      Family History  Problem Relation Age of Onset  . Hypertension    . Cancer Sister 56    colon  . Diabetes Sister   . Cancer Brother     brother - stomach ca  . Stomach cancer Brother   . Colon polyps Sister     and Brother  . Cancer Sister 83     colon  . Kidney disease Brother   . Diabetes Brother   . Diabetes Brother   . Heart disease Father   . Other Mother     sclerdermia  . Diabetes Paternal  Grandfather   . Colon cancer Sister   . Esophageal cancer Neg Hx   . Rectal cancer Neg Hx     Social History:  reports that she has been smoking Cigarettes.  She has a 5.00 pack-year smoking history. She has never used smokeless tobacco. She reports that she does not drink alcohol or use drugs.  Allergies:  Allergies  Allergen Reactions  . Metronidazole     REACTION: upset stomach  . Oxycodone-Acetaminophen     Pt tolerates Vicodin    Review of Systems:   HYPERTENSION: Has history of high blood pressure  treated with losartan 100 mg daily By her PCP     Examination:   BP 128/78   Pulse 90   Ht 5\' 5"  (1.651 m)   Wt 180 lb (81.6 kg)   BMI 29.95 kg/m     Eyes:  minimal eyelid swelling present along with mild proptosis, no deviation of the eyeballs Neck exam is normal    REFLEXES: at biceps are normal    Assessment/Plan:   Post-ablative hypothyroidism  She has post ablative hypothyroidism Since her last visit even though subjectively she is doing fairly well and is compliant with her levothyroxine daily her TSH is high normal now No other symptoms of hypothyroidism.  She will continue the same dose of 88 g but add extra half tablet weekly and follow-up in 6 months, to call if she has unusual fatigue  [place] ophthalmopathy:   Is followed by ophthalmologist, now doing better and diplopia result of the surgery     [facility] 12/26/2015, 11:03 AM   Note: This office note was prepared with Estate agent. Any transcriptional errors that result from this process are unintentional.

## 2016-01-01 ENCOUNTER — Ambulatory Visit (INDEPENDENT_AMBULATORY_CARE_PROVIDER_SITE_OTHER): Payer: BLUE CROSS/BLUE SHIELD | Admitting: *Deleted

## 2016-01-01 DIAGNOSIS — J309 Allergic rhinitis, unspecified: Secondary | ICD-10-CM

## 2016-01-06 ENCOUNTER — Encounter: Payer: Self-pay | Admitting: Internal Medicine

## 2016-01-06 ENCOUNTER — Ambulatory Visit (INDEPENDENT_AMBULATORY_CARE_PROVIDER_SITE_OTHER): Payer: BLUE CROSS/BLUE SHIELD

## 2016-01-06 ENCOUNTER — Ambulatory Visit (INDEPENDENT_AMBULATORY_CARE_PROVIDER_SITE_OTHER): Payer: BLUE CROSS/BLUE SHIELD | Admitting: Internal Medicine

## 2016-01-06 DIAGNOSIS — E0500 Thyrotoxicosis with diffuse goiter without thyrotoxic crisis or storm: Secondary | ICD-10-CM

## 2016-01-06 DIAGNOSIS — J449 Chronic obstructive pulmonary disease, unspecified: Secondary | ICD-10-CM

## 2016-01-06 DIAGNOSIS — I10 Essential (primary) hypertension: Secondary | ICD-10-CM | POA: Diagnosis not present

## 2016-01-06 DIAGNOSIS — M5440 Lumbago with sciatica, unspecified side: Secondary | ICD-10-CM

## 2016-01-06 DIAGNOSIS — M25562 Pain in left knee: Secondary | ICD-10-CM

## 2016-01-06 DIAGNOSIS — E890 Postprocedural hypothyroidism: Secondary | ICD-10-CM

## 2016-01-06 DIAGNOSIS — I2699 Other pulmonary embolism without acute cor pulmonale: Secondary | ICD-10-CM

## 2016-01-06 DIAGNOSIS — J309 Allergic rhinitis, unspecified: Secondary | ICD-10-CM

## 2016-01-06 DIAGNOSIS — J209 Acute bronchitis, unspecified: Secondary | ICD-10-CM | POA: Diagnosis not present

## 2016-01-06 DIAGNOSIS — M25561 Pain in right knee: Secondary | ICD-10-CM

## 2016-01-06 DIAGNOSIS — G8929 Other chronic pain: Secondary | ICD-10-CM

## 2016-01-06 MED ORDER — HYDROCODONE-ACETAMINOPHEN 5-325 MG PO TABS: 1.0000 | ORAL_TABLET | Freq: Three times a day (TID) | ORAL | 0 refills | Status: DC | PRN

## 2016-01-06 MED ORDER — PROMETHAZINE-CODEINE 6.25-10 MG/5ML PO SYRP: ORAL_SOLUTION | ORAL | 0 refills | Status: DC

## 2016-01-06 NOTE — Progress Notes (Signed)
Pre visit review using our clinic review tool, if applicable. No additional management support is needed unless otherwise documented below in the visit note. 

## 2016-01-06 NOTE — Assessment & Plan Note (Signed)
Xarelto

## 2016-01-06 NOTE — Assessment & Plan Note (Signed)
Norco prn  Potential benefits of a long term testosterone use as well as potential risks (BPH, MI, OSA, cancer)  and complications were explained to the patient and were aknowledged.

## 2016-01-06 NOTE — Progress Notes (Signed)
Subjective:  Patient ID: Ashley Larsen, female    DOB: [birth year]  Age: 65 y.o. MRN: [id number]  CC: No chief complaint on file.   HPI Ashley Larsen presents for HTN, LBP,knee OA, COPD f/u. C/o cough. Almost quit smoking...  Outpatient Medications Prior to Visit  Medication Sig Dispense Refill  . amLODipine (NORVASC) 5 MG tablet Take 1 tablet (5 mg total) by mouth daily. 90 tablet 3  . fexofenadine (ALLEGRA) 180 MG tablet Take 1 tablet (180 mg total) by mouth daily as needed for allergies. 90 tablet 3  . fluticasone (FLONASE) 50 MCG/ACT nasal spray Place 2 sprays into both nostrils daily. 16 g 6  . HYDROcodone-acetaminophen (NORCO/VICODIN) 5-325 MG tablet Take 1 tablet by mouth every 8 (eight) hours as needed for severe pain. 90 tablet 0  . levothyroxine (SYNTHROID, LEVOTHROID) 88 MCG tablet take 1 tablet by mouth once daily 30 tablet 1  . losartan (COZAAR) 100 MG tablet Take 1 tablet (100 mg total) by mouth daily. 30 tablet 11  . NON FORMULARY Reported on 07/29/2015    . Polyethyl Glycol-Propyl Glycol (SYSTANE OP) Place 1 drop into both eyes daily as needed. Reported on 07/29/2015    . promethazine-codeine (PHENERGAN WITH CODEINE) 6.25-10 MG/5ML syrup take 5 milliliters by mouth every 6 hours if needed for cough 300 mL 0  . rivaroxaban (XARELTO) 20 MG TABS tablet Take 1 tablet (20 mg total) by mouth daily with supper. 30 tablet 5  . Selenium 100 MCG TABS 1 po bid for your eye condition 200 each 2  . triamcinolone ointment (KENALOG) 0.5 % Apply 1 application topically 3 (three) times daily. 30 g 1   No facility-administered medications prior to visit.     ROS Review of Systems  Constitutional: Negative for activity change, appetite change, chills, fatigue and unexpected weight change.  HENT: Negative for congestion, mouth sores and sinus pressure.   Eyes: Negative for visual disturbance.  Respiratory: Positive for cough and wheezing. Negative for chest tightness.     Gastrointestinal: Negative for abdominal pain and nausea.  Genitourinary: Negative for difficulty urinating, frequency and vaginal pain.  Musculoskeletal: Positive for arthralgias and back pain. Negative for gait problem.  Skin: Negative for pallor and rash.  Neurological: Negative for dizziness, tremors, weakness, numbness and headaches.  Psychiatric/Behavioral: Negative for confusion and sleep disturbance.    Objective:  BP 126/70   Pulse 76   Temp 98.6 F (37 C) (Oral)   Wt 181 lb (82.1 kg)   SpO2 98%   BMI 30.12 kg/m   BP Readings from Last 3 Encounters:  01/06/16 126/70  12/26/15 128/78  10/06/15 120/72    Wt Readings from Last 3 Encounters:  01/06/16 181 lb (82.1 kg)  12/26/15 180 lb (81.6 kg)  10/06/15 181 lb (82.1 kg)    Physical Exam  Constitutional: She appears well-developed. No distress.  HENT:  Head: Normocephalic.  Right Ear: External ear normal.  Left Ear: External ear normal.  Nose: Nose normal.  Mouth/Throat: Oropharynx is clear and moist.  Eyes: Conjunctivae are normal. Pupils are equal, round, and reactive to light. Right eye exhibits no discharge. Left eye exhibits no discharge.  Neck: Normal range of motion. Neck supple. No JVD present. No tracheal deviation present. No thyromegaly present.  Cardiovascular: Normal rate, regular rhythm and normal heart sounds.   Pulmonary/Chest: No stridor. No respiratory distress. She has no wheezes.  Abdominal: Soft. Bowel sounds are normal. She exhibits no distension and no mass. There is no  tenderness. There is no rebound and no guarding.  Musculoskeletal: She exhibits no edema or tenderness.  Lymphadenopathy:    She has no cervical adenopathy.  Neurological: She displays normal reflexes. No cranial nerve deficit. She exhibits normal muscle tone. Coordination normal.  Skin: No rash noted. No erythema.  Psychiatric: She has a normal mood and affect. Her behavior is normal. Judgment and thought content normal.     Lab Results  Component Value Date   WBC 5.4 10/07/2015   HGB 13.3 10/07/2015   HCT 38.4 10/07/2015   PLT 241.0 10/07/2015   GLUCOSE 92 10/07/2015   CHOL 228 (H) 10/07/2015   TRIG 114.0 10/07/2015   HDL 48.40 10/07/2015   LDLDIRECT 185.7 01/19/2012   LDLCALC 157 (H) 10/07/2015   ALT 23 10/07/2015   AST 18 10/07/2015   NA 142 10/07/2015   K 3.9 10/07/2015   CL 106 10/07/2015   CREATININE 0.92 10/07/2015   BUN 12 10/07/2015   CO2 29 10/07/2015   TSH 4.37 12/23/2015   INR 1.8 (H) 10/07/2015   HGBA1C 6.0 08/26/2008    Dg Chest 2 View  Result Date: 04/09/2015 CLINICAL DATA:  Shortness of breath beginning yesterday.  Fatigue. EXAM: CHEST  2 VIEW COMPARISON:  01/23/2014 FINDINGS: The heart size and mediastinal contours are within normal limits. Both lungs are clear. No evidence of pneumothorax or pleural effusion. Mild thoracic spine degenerative changes noted. IMPRESSION: No active cardiopulmonary disease. Electronically Signed   By: Earle Gell M.D.   On: 04/09/2015 17:24   Ct Angio Chest Pe W/cm &/or Wo Cm  Result Date: 04/09/2015 CLINICAL DATA:  Shortness of breath for several weeks. Worse the past 2 days. Tachycardia. Hypotension. EXAM: CT ANGIOGRAPHY CHEST WITH CONTRAST TECHNIQUE: Multidetector CT imaging of the chest was performed using the standard protocol during bolus administration of intravenous contrast. Multiplanar CT image reconstructions and MIPs were obtained to evaluate the vascular anatomy. CONTRAST:  58mL OMNIPAQUE IOHEXOL 350 MG/ML SOLN COMPARISON:  Chest radiographs obtained earlier today. Chest CTA dated 04/18/2009. FINDINGS: Mediastinum/Lymph Nodes: Central right upper lobe pulmonary arterial filling defects. The right ventricular to left ventricular ratio is 1.0. No enlarged lymph nodes. Lungs/Pleura: No pulmonary mass, infiltrate, or effusion. Upper abdomen: No acute findings. Musculoskeletal: Thoracic and upper lumbar spine degenerative changes. Review of the  MIP images confirms the above findings. IMPRESSION: Central right upper lobe pulmonary emboli with CT evidence of right heart strain (RV/LV Ratio = 1.0) consistent with at least submassive (intermediate risk) PE. The presence of right heart strain has been associated with an increased risk of morbidity and mortality. Please activate Code PE by paging [phone]. Critical Value/emergent results were called by telephone at the time of interpretation on 04/09/2015 at 7:57 pm to Dr. Theotis Burrow , who verbally acknowledged these results. Electronically Signed   By: Claudie Revering M.D.   On: 04/09/2015 19:58    Assessment & Plan:   Diagnoses and all orders for this visit:  Acute bronchitis, unspecified organism -     promethazine-codeine (PHENERGAN WITH CODEINE) 6.25-10 MG/5ML syrup; take 5 milliliters by mouth every 6 hours if needed for cough  Other orders -     HYDROcodone-acetaminophen (NORCO/VICODIN) 5-325 MG tablet; Take 1 tablet by mouth every 8 (eight) hours as needed for severe pain.   I am having Ms. Solum maintain her Selenium, NON FORMULARY, Polyethyl Glycol-Propyl Glycol (SYSTANE OP), fexofenadine, fluticasone, amLODipine, losartan, rivaroxaban, promethazine-codeine, triamcinolone ointment, HYDROcodone-acetaminophen, and levothyroxine.  No orders of the defined types  were placed in this encounter.    Follow-up: No Follow-up on file.  Walker Kehr, MD

## 2016-01-06 NOTE — Assessment & Plan Note (Signed)
Norco prn 

## 2016-01-06 NOTE — Assessment & Plan Note (Signed)
On Levothroid 

## 2016-01-06 NOTE — Assessment & Plan Note (Signed)
Cough - Rx Prom-cod (not to take w/Norco)

## 2016-01-06 NOTE — Assessment & Plan Note (Signed)
Losartan, Norvasc 

## 2016-01-06 NOTE — Assessment & Plan Note (Signed)
S/p surgery at [facility]

## 2016-02-06 ENCOUNTER — Ambulatory Visit (INDEPENDENT_AMBULATORY_CARE_PROVIDER_SITE_OTHER): Payer: BLUE CROSS/BLUE SHIELD | Admitting: *Deleted

## 2016-02-06 DIAGNOSIS — J309 Allergic rhinitis, unspecified: Secondary | ICD-10-CM

## 2016-02-12 ENCOUNTER — Other Ambulatory Visit: Payer: Self-pay | Admitting: Endocrinology

## 2016-02-13 ENCOUNTER — Ambulatory Visit (INDEPENDENT_AMBULATORY_CARE_PROVIDER_SITE_OTHER): Payer: BLUE CROSS/BLUE SHIELD | Admitting: *Deleted

## 2016-02-13 DIAGNOSIS — J309 Allergic rhinitis, unspecified: Secondary | ICD-10-CM

## 2016-03-05 ENCOUNTER — Other Ambulatory Visit: Payer: Self-pay | Admitting: Internal Medicine

## 2016-03-29 ENCOUNTER — Other Ambulatory Visit: Payer: Self-pay | Admitting: Internal Medicine

## 2016-03-29 DIAGNOSIS — J209 Acute bronchitis, unspecified: Secondary | ICD-10-CM

## 2016-04-01 NOTE — Telephone Encounter (Signed)
Called refill into [facility]...Johny Chess

## 2016-04-05 ENCOUNTER — Other Ambulatory Visit (INDEPENDENT_AMBULATORY_CARE_PROVIDER_SITE_OTHER): Payer: BLUE CROSS/BLUE SHIELD

## 2016-04-05 ENCOUNTER — Encounter: Payer: Self-pay | Admitting: Internal Medicine

## 2016-04-05 ENCOUNTER — Ambulatory Visit (INDEPENDENT_AMBULATORY_CARE_PROVIDER_SITE_OTHER): Payer: BLUE CROSS/BLUE SHIELD | Admitting: Internal Medicine

## 2016-04-05 DIAGNOSIS — M25561 Pain in right knee: Secondary | ICD-10-CM

## 2016-04-05 DIAGNOSIS — I2699 Other pulmonary embolism without acute cor pulmonale: Secondary | ICD-10-CM

## 2016-04-05 DIAGNOSIS — I10 Essential (primary) hypertension: Secondary | ICD-10-CM | POA: Diagnosis not present

## 2016-04-05 DIAGNOSIS — E890 Postprocedural hypothyroidism: Secondary | ICD-10-CM

## 2016-04-05 DIAGNOSIS — G8929 Other chronic pain: Secondary | ICD-10-CM

## 2016-04-05 DIAGNOSIS — M25562 Pain in left knee: Secondary | ICD-10-CM

## 2016-04-05 LAB — TSH: TSH: 3.96 u[IU]/mL (ref 0.35–4.50)

## 2016-04-05 LAB — T4, FREE: FREE T4: 0.97 ng/dL (ref 0.60–1.60)

## 2016-04-05 MED ORDER — HYDROCODONE-ACETAMINOPHEN 5-325 MG PO TABS: 1.0000 | ORAL_TABLET | Freq: Three times a day (TID) | ORAL | 0 refills | Status: DC | PRN

## 2016-04-05 NOTE — Assessment & Plan Note (Signed)
On Xarelto 

## 2016-04-05 NOTE — Assessment & Plan Note (Signed)
L>>R Norco prn  Potential benefits of a long term opioids use as well as potential risks (i.e. addiction risk, apnea etc) and complications (i.e. Somnolence, constipation and others) were explained to the patient and were aknowledged.

## 2016-04-05 NOTE — Progress Notes (Signed)
Pre-visit discussion using our clinic review tool. No additional management support is needed unless otherwise documented below in the visit note.  

## 2016-04-05 NOTE — Progress Notes (Signed)
Subjective:  Patient ID: Ashley Larsen, female    DOB: 1950-07-28  Age: 66 y.o. MRN: [id number]  CC: Follow-up (HTN, hypokalemia, )   HPI Ashley Larsen presents for OA, knee pain L>>R, hypothyroidism.   Outpatient Medications Prior to Visit  Medication Sig Dispense Refill  . amLODipine (NORVASC) 5 MG tablet Take 1 tablet (5 mg total) by mouth daily. 90 tablet 3  . fexofenadine (ALLEGRA) 180 MG tablet Take 1 tablet (180 mg total) by mouth daily as needed for allergies. 90 tablet 3  . fluticasone (FLONASE) 50 MCG/ACT nasal spray Place 2 sprays into both nostrils daily. 16 g 6  . HYDROcodone-acetaminophen (NORCO/VICODIN) 5-325 MG tablet Take 1 tablet by mouth every 8 (eight) hours as needed for severe pain. 90 tablet 0  . levothyroxine (SYNTHROID, LEVOTHROID) 88 MCG tablet take 1 tablet by mouth once daily 30 tablet 1  . losartan (COZAAR) 100 MG tablet Take 1 tablet (100 mg total) by mouth daily. 30 tablet 11  . NON FORMULARY Reported on 07/29/2015    . Polyethyl Glycol-Propyl Glycol (SYSTANE OP) Place 1 drop into both eyes daily as needed. Reported on 07/29/2015    . promethazine-codeine (PHENERGAN WITH CODEINE) 6.25-10 MG/5ML syrup take 5 milliliters by mouth every 6 hours if needed for cough 300 mL 0  . Selenium 100 MCG TABS 1 po bid for your eye condition 200 each 2  . triamcinolone ointment (KENALOG) 0.5 % Apply 1 application topically 3 (three) times daily. 30 g 1  . XARELTO 20 MG TABS tablet take 1 tablet by mouth once daily with SUPPER 30 tablet 5   No facility-administered medications prior to visit.     ROS Review of Systems  Constitutional: Negative for activity change, appetite change, chills, fatigue and unexpected weight change.  HENT: Negative for congestion, mouth sores and sinus pressure.   Eyes: Negative for visual disturbance.  Respiratory: Negative for cough and chest tightness.   Gastrointestinal: Negative for abdominal pain and nausea.  Genitourinary: Negative  for difficulty urinating, frequency and vaginal pain.  Musculoskeletal: Positive for arthralgias and gait problem. Negative for back pain.  Skin: Negative for pallor and rash.  Neurological: Negative for dizziness, tremors, weakness, numbness and headaches.  Psychiatric/Behavioral: Negative for confusion and sleep disturbance.    Objective:  BP 118/84   Pulse 85   Temp 98.5 F (36.9 C) (Oral)   Resp 16   Ht 5\' 5"  (1.651 m)   Wt 181 lb 4 oz (82.2 kg)   SpO2 99%   BMI 30.16 kg/m   BP Readings from Last 3 Encounters:  04/05/16 118/84  01/06/16 126/70  12/26/15 128/78    Wt Readings from Last 3 Encounters:  04/05/16 181 lb 4 oz (82.2 kg)  01/06/16 181 lb (82.1 kg)  12/26/15 180 lb (81.6 kg)    Physical Exam  Constitutional: She appears well-developed. No distress.  HENT:  Head: Normocephalic.  Right Ear: External ear normal.  Left Ear: External ear normal.  Nose: Nose normal.  Mouth/Throat: Oropharynx is clear and moist.  Eyes: Conjunctivae are normal. Pupils are equal, round, and reactive to light. Right eye exhibits no discharge. Left eye exhibits no discharge.  Neck: Normal range of motion. Neck supple. No JVD present. No tracheal deviation present. No thyromegaly present.  Cardiovascular: Normal rate, regular rhythm and normal heart sounds.   Pulmonary/Chest: No stridor. No respiratory distress. She has no wheezes.  Abdominal: Soft. Bowel sounds are normal. She exhibits no distension and no mass.  There is no tenderness. There is no rebound and no guarding.  Musculoskeletal: She exhibits tenderness. She exhibits no edema.  Lymphadenopathy:    She has no cervical adenopathy.  Neurological: She displays normal reflexes. No cranial nerve deficit. She exhibits normal muscle tone. Coordination normal.  Skin: No rash noted. No erythema.  Psychiatric: She has a normal mood and affect. Her behavior is normal. Judgment and thought content normal.  knees are tender L>>R  Lab  Results  Component Value Date   WBC 5.4 10/07/2015   HGB 13.3 10/07/2015   HCT 38.4 10/07/2015   PLT 241.0 10/07/2015   GLUCOSE 92 10/07/2015   CHOL 228 (H) 10/07/2015   TRIG 114.0 10/07/2015   HDL 48.40 10/07/2015   LDLDIRECT 185.7 01/19/2012   LDLCALC 157 (H) 10/07/2015   ALT 23 10/07/2015   AST 18 10/07/2015   NA 142 10/07/2015   K 3.9 10/07/2015   CL 106 10/07/2015   CREATININE 0.92 10/07/2015   BUN 12 10/07/2015   CO2 29 10/07/2015   TSH 4.37 12/23/2015   INR 1.8 (H) 10/07/2015   HGBA1C 6.0 08/26/2008    Dg Chest 2 View  Result Date: 04/09/2015 CLINICAL DATA:  Shortness of breath beginning yesterday.  Fatigue. EXAM: CHEST  2 VIEW COMPARISON:  01/23/2014 FINDINGS: The heart size and mediastinal contours are within normal limits. Both lungs are clear. No evidence of pneumothorax or pleural effusion. Mild thoracic spine degenerative changes noted. IMPRESSION: No active cardiopulmonary disease. Electronically Signed   By: Earle Gell M.D.   On: 04/09/2015 17:24   Ct Angio Chest Pe W/cm &/or Wo Cm  Result Date: 04/09/2015 CLINICAL DATA:  Shortness of breath for several weeks. Worse the past 2 days. Tachycardia. Hypotension. EXAM: CT ANGIOGRAPHY CHEST WITH CONTRAST TECHNIQUE: Multidetector CT imaging of the chest was performed using the standard protocol during bolus administration of intravenous contrast. Multiplanar CT image reconstructions and MIPs were obtained to evaluate the vascular anatomy. CONTRAST:  29mL OMNIPAQUE IOHEXOL 350 MG/ML SOLN COMPARISON:  Chest radiographs obtained earlier today. Chest CTA dated 04/18/2009. FINDINGS: Mediastinum/Lymph Nodes: Central right upper lobe pulmonary arterial filling defects. The right ventricular to left ventricular ratio is 1.0. No enlarged lymph nodes. Lungs/Pleura: No pulmonary mass, infiltrate, or effusion. Upper abdomen: No acute findings. Musculoskeletal: Thoracic and upper lumbar spine degenerative changes. Review of the MIP images  confirms the above findings. IMPRESSION: Central right upper lobe pulmonary emboli with CT evidence of right heart strain (RV/LV Ratio = 1.0) consistent with at least submassive (intermediate risk) PE. The presence of right heart strain has been associated with an increased risk of morbidity and mortality. Please activate Code PE by paging [phone]. Critical Value/emergent results were called by telephone at the time of interpretation on 04/09/2015 at 7:57 pm to Dr. Theotis Burrow , who verbally acknowledged these results. Electronically Signed   By: Claudie Revering M.D.   On: 04/09/2015 19:58    Assessment & Plan:   There are no diagnoses linked to this encounter. I am having Ms. Schickling maintain her Selenium, NON FORMULARY, Polyethyl Glycol-Propyl Glycol (SYSTANE OP), fexofenadine, fluticasone, amLODipine, losartan, triamcinolone ointment, HYDROcodone-acetaminophen, levothyroxine, XARELTO, and promethazine-codeine.  No orders of the defined types were placed in this encounter.    Follow-up: No Follow-up on file.  Walker Kehr, MD

## 2016-04-05 NOTE — Assessment & Plan Note (Signed)
Levothroid 

## 2016-04-05 NOTE — Assessment & Plan Note (Signed)
Losartan, Norvasc 

## 2016-04-09 NOTE — Progress Notes (Signed)
Please let patient know that the lab result is slightly better, no change needed

## 2016-04-16 ENCOUNTER — Other Ambulatory Visit: Payer: Self-pay | Admitting: Endocrinology

## 2016-06-09 ENCOUNTER — Other Ambulatory Visit: Payer: Self-pay | Admitting: Endocrinology

## 2016-06-09 DIAGNOSIS — E890 Postprocedural hypothyroidism: Secondary | ICD-10-CM

## 2016-06-14 ENCOUNTER — Other Ambulatory Visit: Payer: Self-pay | Admitting: Endocrinology

## 2016-06-21 ENCOUNTER — Other Ambulatory Visit (INDEPENDENT_AMBULATORY_CARE_PROVIDER_SITE_OTHER): Payer: BLUE CROSS/BLUE SHIELD

## 2016-06-21 DIAGNOSIS — E890 Postprocedural hypothyroidism: Secondary | ICD-10-CM

## 2016-06-21 LAB — TSH: TSH: 4.04 u[IU]/mL (ref 0.35–4.50)

## 2016-06-21 LAB — T4, FREE: Free T4: 0.87 ng/dL (ref 0.60–1.60)

## 2016-06-24 ENCOUNTER — Encounter: Payer: Self-pay | Admitting: Endocrinology

## 2016-06-24 ENCOUNTER — Ambulatory Visit (INDEPENDENT_AMBULATORY_CARE_PROVIDER_SITE_OTHER): Payer: BLUE CROSS/BLUE SHIELD | Admitting: Endocrinology

## 2016-06-24 VITALS — BP 134/88 | HR 85 | Ht 65.0 in | Wt 181.0 lb

## 2016-06-24 DIAGNOSIS — E890 Postprocedural hypothyroidism: Secondary | ICD-10-CM

## 2016-06-24 MED ORDER — LEVOTHYROXINE SODIUM 100 MCG PO TABS: 100.0000 ug | ORAL_TABLET | Freq: Every day | ORAL | 3 refills | Status: DC

## 2016-06-24 NOTE — Progress Notes (Signed)
Patient ID: Ashley Larsen, female   DOB: 10-May-1950, 66 y.o.   MRN: [id number]   Reason for Appointment:  Post ablative hypothyroidism, followup   History of Present Illness:   She had hyperthyroidism first diagnosed in 01/2013 and treated with radioactive iodine on 05/03/13 with 12 mCi She became hypothyroid subsequently and in 6/15 with a free T4 level of 0.57 she was started on 112 mcg Synthroid  Recent history: Her dose was reduced to 88 mcg in 07/2013 because of relatively high free T4 of 1.4 and low TSH  However since her TSH was high in 3/17 associated with fatigue for a couple of months she is back on 88 g Currently taking 7-1/2 tablets per week on her Synthroid  She may have missed 1 dose of her Synthroid this month, may occasionally run short However she feels fairly good overall with no unusual fatigue and no change with adjusting her dose on the last visit 6 months ago  No complaints of cold intolerance and no change in weight  She has been regular with her Synthroid dose before breakfast and has not taken any new vitamins or calcium   Wt Readings from Last 3 Encounters:  06/24/16 181 lb (82.1 kg)  04/05/16 181 lb 4 oz (82.2 kg)  01/06/16 181 lb (82.1 kg)    Her TSH Is about the same at 4.0  Labs:   Lab Results  Component Value Date   TSH 4.04 06/21/2016   TSH 3.96 04/05/2016   TSH 4.37 12/23/2015   FREET4 0.87 06/21/2016   FREET4 0.97 04/05/2016   FREET4 0.88 12/23/2015        OPHTHALMOPATHY:  No subjective diplopia with strabismus surgery done at [facility] Does not complain of local irritation or tearing   Allergies as of 06/24/2016      Reactions   Metronidazole    REACTION: upset stomach   Oxycodone-acetaminophen    Pt tolerates Vicodin      Medication List       Accurate as of 06/24/16 11:01 AM. Always use your most recent med list.            amLODipine 5 MG tablet Commonly known as:  NORVASC Take 1 tablet (5 mg total) by mouth daily.   fexofenadine 180 MG tablet Commonly known as:  ALLEGRA Take 1 tablet (180 mg total) by mouth daily as needed for allergies.   fluticasone 50 MCG/ACT nasal spray Commonly known as:  FLONASE Place 2 sprays into both nostrils daily.   HYDROcodone-acetaminophen 5-325 MG tablet Commonly known as:  NORCO/VICODIN Take 1 tablet by mouth every 8 (eight) hours as needed for severe pain.   levothyroxine 88 MCG tablet Commonly known as:  SYNTHROID, LEVOTHROID take 1 tablet by mouth once daily   losartan 100 MG tablet Commonly known as:  COZAAR Take 1 tablet (100 mg total) by mouth daily.   triamcinolone ointment 0.5 % Commonly known as:  KENALOG Apply 1 application topically 3 (three)  times daily.   XARELTO 20 MG Tabs tablet Generic drug:  rivaroxaban take 1 tablet by mouth once daily with SUPPER           Past Medical History:  Diagnosis Date  . Allergic rhinitis   . Alopecia   . Asthma   . Cataract    bilateral cateracts  . COPD (chronic obstructive pulmonary disease) ([place])   . Diverticulosis   . GERD (gastroesophageal reflux disease)   . Hypertension   . Hyperthyroidism   . IBS (irritable bowel syndrome)   . Insomnia   . Ovarian cyst   . Paresthesia     Past Surgical History:  Procedure Laterality Date  . COLONOSCOPY    . KNEE ARTHROSCOPY     Left  . OOPHORECTOMY     Left  . ROTATOR CUFF REPAIR    . TUBAL LIGATION      Family History  Problem Relation Age of Onset  . Cancer Brother        brother - stomach ca  . Stomach cancer Brother   . Heart disease Father   . Other Mother        sclerdermia  . Colon cancer Sister   . Hypertension Unknown   . Cancer Sister 38       colon  . Diabetes Sister   . Colon polyps Sister        and Brother  . Cancer Sister 63        colon  . Kidney disease Brother   . Diabetes Brother   . Diabetes Brother   . Diabetes  Paternal Grandfather   . Esophageal cancer Neg Hx   . Rectal cancer Neg Hx     Social History:  reports that she has been smoking Cigarettes.  She has a 5.00 pack-year smoking history. She uses smokeless tobacco. She reports that she does not drink alcohol or use drugs.  Allergies:  Allergies  Allergen Reactions  . Metronidazole     REACTION: upset stomach  . Oxycodone-Acetaminophen     Pt tolerates Vicodin    Review of Systems:   HYPERTENSION: Has history of high blood pressure  treated with losartan 100 mg daily By her PCP  [facility] BP Usually about 120/68, blood pressure appears higher in the office  BP Readings from Last 3 Encounters:  06/24/16 134/88  04/05/16 118/84  01/06/16 126/70       Examination:   BP 134/88   Pulse 85   Ht 5\' 5"  (1.651 m)   Wt 181 lb (82.1 kg)   SpO2 97%   BMI 30.12 kg/m     Exam not indicated    Assessment/Plan:   Post-ablative hypothyroidism  She has post ablative hypothyroidism Currently taking 88 g, 6-1/2 tablets a week with no change in TSH after the recent increase However subjectively she is doing well  Since she can occasionally be running short of her prescription and prefers a single tablet daily will increase her dose to 100 g once a day   [place]ves ophthalmopathy:   Stable and improved  Family history of diabetes and previous A1c of 6%: She will discuss diabetes screening with her PCP at her upcoming visit   [place] 06/24/2016, 11:01 AM   Note: This office note was prepared with Estate agent. Any transcriptional errors that result from this process are unintentional.

## 2016-07-07 ENCOUNTER — Encounter: Payer: Self-pay | Admitting: Internal Medicine

## 2016-07-07 ENCOUNTER — Ambulatory Visit (INDEPENDENT_AMBULATORY_CARE_PROVIDER_SITE_OTHER): Payer: BLUE CROSS/BLUE SHIELD | Admitting: Internal Medicine

## 2016-07-07 ENCOUNTER — Other Ambulatory Visit (INDEPENDENT_AMBULATORY_CARE_PROVIDER_SITE_OTHER): Payer: BLUE CROSS/BLUE SHIELD

## 2016-07-07 DIAGNOSIS — I10 Essential (primary) hypertension: Secondary | ICD-10-CM | POA: Diagnosis not present

## 2016-07-07 DIAGNOSIS — E876 Hypokalemia: Secondary | ICD-10-CM | POA: Diagnosis not present

## 2016-07-07 DIAGNOSIS — G8929 Other chronic pain: Secondary | ICD-10-CM

## 2016-07-07 DIAGNOSIS — J449 Chronic obstructive pulmonary disease, unspecified: Secondary | ICD-10-CM | POA: Diagnosis not present

## 2016-07-07 DIAGNOSIS — M5440 Lumbago with sciatica, unspecified side: Secondary | ICD-10-CM | POA: Diagnosis not present

## 2016-07-07 LAB — BASIC METABOLIC PANEL
BUN: 12 mg/dL (ref 6–23)
CO2: 29 mEq/L (ref 19–32)
Calcium: 10 mg/dL (ref 8.4–10.5)
Chloride: 106 mEq/L (ref 96–112)
Creatinine, Ser: 0.95 mg/dL (ref 0.40–1.20)
GFR: 76.18 mL/min (ref >60.00)
Glucose, Bld: 72 mg/dL (ref 70–99)
POTASSIUM: 3.4 meq/L — AB (ref 3.5–5.1)
Sodium: 142 mEq/L (ref 135–145)

## 2016-07-07 MED ORDER — PROMETHAZINE-CODEINE 6.25-10 MG/5ML PO SYRP: 5.0000 mL | ORAL_SOLUTION | ORAL | 0 refills | Status: DC | PRN

## 2016-07-07 MED ORDER — HYDROCODONE-ACETAMINOPHEN 5-325 MG PO TABS: 1.0000 | ORAL_TABLET | Freq: Three times a day (TID) | ORAL | 0 refills | Status: DC | PRN

## 2016-07-07 NOTE — Assessment & Plan Note (Signed)
MSK neck pain/HAs CT advised - declined

## 2016-07-07 NOTE — Assessment & Plan Note (Signed)
  Prom-cod (not to take w/Norco)

## 2016-07-07 NOTE — Patient Instructions (Signed)
Rice sock for neck

## 2016-07-07 NOTE — Assessment & Plan Note (Signed)
Labs
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
## 2016-07-07 NOTE — Assessment & Plan Note (Signed)
Norco prn 

## 2016-07-07 NOTE — Assessment & Plan Note (Signed)
Losartan, Norvasc Labs

## 2016-08-09 ENCOUNTER — Other Ambulatory Visit: Payer: Self-pay | Admitting: Internal Medicine

## 2016-08-13 ENCOUNTER — Other Ambulatory Visit: Payer: Self-pay | Admitting: Internal Medicine

## 2016-09-11 ENCOUNTER — Other Ambulatory Visit: Payer: Self-pay | Admitting: Internal Medicine

## 2016-09-14 LAB — HM DIABETES EYE EXAM

## 2016-10-08 ENCOUNTER — Ambulatory Visit (INDEPENDENT_AMBULATORY_CARE_PROVIDER_SITE_OTHER): Payer: BLUE CROSS/BLUE SHIELD | Admitting: Internal Medicine

## 2016-10-08 ENCOUNTER — Encounter: Payer: Self-pay | Admitting: Internal Medicine

## 2016-10-08 VITALS — BP 116/72 | HR 88 | Temp 98.6°F | Ht 65.0 in | Wt 182.0 lb

## 2016-10-08 DIAGNOSIS — Z0000 Encounter for general adult medical examination without abnormal findings: Secondary | ICD-10-CM | POA: Diagnosis not present

## 2016-10-08 DIAGNOSIS — Z23 Encounter for immunization: Secondary | ICD-10-CM

## 2016-10-08 DIAGNOSIS — G8929 Other chronic pain: Secondary | ICD-10-CM

## 2016-10-08 DIAGNOSIS — M5441 Lumbago with sciatica, right side: Secondary | ICD-10-CM

## 2016-10-08 DIAGNOSIS — J449 Chronic obstructive pulmonary disease, unspecified: Secondary | ICD-10-CM

## 2016-10-08 DIAGNOSIS — E890 Postprocedural hypothyroidism: Secondary | ICD-10-CM

## 2016-10-08 DIAGNOSIS — J4489 Other specified chronic obstructive pulmonary disease: Secondary | ICD-10-CM

## 2016-10-08 DIAGNOSIS — M5440 Lumbago with sciatica, unspecified side: Secondary | ICD-10-CM

## 2016-10-08 DIAGNOSIS — I10 Essential (primary) hypertension: Secondary | ICD-10-CM

## 2016-10-08 MED ORDER — PROMETHAZINE-CODEINE 6.25-10 MG/5ML PO SYRP: 5.0000 mL | ORAL_SOLUTION | ORAL | 0 refills | Status: DC | PRN

## 2016-10-08 MED ORDER — LOSARTAN POTASSIUM 100 MG PO TABS: 100.0000 mg | ORAL_TABLET | Freq: Every day | ORAL | 3 refills | Status: DC

## 2016-10-08 MED ORDER — HYDROCODONE-ACETAMINOPHEN 5-325 MG PO TABS: 1.0000 | ORAL_TABLET | Freq: Three times a day (TID) | ORAL | 0 refills | Status: DC | PRN

## 2016-10-08 NOTE — Assessment & Plan Note (Signed)
Norco prn  Potential benefits of a long term opioids use as well as potential risks (i.e. addiction risk, apnea etc) and complications (i.e. Somnolence, constipation and others) were explained to the patient and were aknowledged. 

## 2016-10-08 NOTE — Assessment & Plan Note (Signed)
Levothroid 

## 2016-10-08 NOTE — Assessment & Plan Note (Signed)
Prom-Cod syr prn

## 2016-10-08 NOTE — Progress Notes (Signed)
Subjective:  Patient ID: Ashley Larsen, female    DOB: [birth year]  Age: 66 y.o. MRN: [id number]  CC: No chief complaint on file.   HPI Ashley Larsen presents for OA/LBP, COPD, allergies f/u. C/o chronic cogh. Pt had a recent cold  Outpatient Medications Prior to Visit  Medication Sig Dispense Refill  . amLODipine (NORVASC) 5 MG tablet take 1 tablet by mouth once daily 90 tablet 2  . fexofenadine (ALLEGRA) 180 MG tablet Take 1 tablet (180 mg total) by mouth daily as needed for allergies. 90 tablet 3  . fluticasone (FLONASE) 50 MCG/ACT nasal spray Place 2 sprays into both nostrils daily. 16 g 6  . HYDROcodone-acetaminophen (NORCO/VICODIN) 5-325 MG tablet Take 1 tablet by mouth every 8 (eight) hours as needed for severe pain. 90 tablet 0  . levothyroxine (SYNTHROID, LEVOTHROID) 100 MCG tablet Take 1 tablet (100 mcg total) by mouth daily. 90 tablet 3  . losartan (COZAAR) 100 MG tablet take 1 tablet by mouth once daily 30 tablet 5  . promethazine-codeine (PHENERGAN WITH CODEINE) 6.25-10 MG/5ML syrup Take 5 mLs by mouth every 4 (four) hours as needed. 300 mL 0  . triamcinolone ointment (KENALOG) 0.5 % Apply 1 application topically 3 (three) times daily. 30 g 1  . XARELTO 20 MG TABS tablet take 1 tablet by mouth once daily WITH SUPPER 30 tablet 5   No facility-administered medications prior to visit.     ROS Review of Systems  Constitutional: Negative for activity change, appetite change, chills, fatigue and unexpected weight change.  HENT: Negative for congestion, mouth sores and sinus pressure.   Eyes: Negative for visual disturbance.  Respiratory: Positive for cough. Negative for chest tightness.   Gastrointestinal: Negative for abdominal pain and nausea.  Genitourinary: Negative for difficulty urinating, frequency and vaginal pain.  Musculoskeletal: Positive for back pain. Negative for gait problem.  Skin: Negative for pallor and rash.  Neurological: Negative for dizziness,  tremors, weakness, numbness and headaches.  Psychiatric/Behavioral: Negative for confusion and sleep disturbance.    Objective:  BP 116/72 (BP Location: Left Arm, Patient Position: Sitting, Cuff Size: Large)   Pulse 88   Temp 98.6 F (37 C) (Oral)   Ht 5\' 5"  (1.651 m)   Wt 182 lb (82.6 kg)   SpO2 99%   BMI 30.29 kg/m   BP Readings from Last 3 Encounters:  10/08/16 116/72  07/07/16 128/80  06/24/16 134/88    Wt Readings from Last 3 Encounters:  10/08/16 182 lb (82.6 kg)  07/07/16 183 lb (83 kg)  06/24/16 181 lb (82.1 kg)    Physical Exam  Constitutional: She appears well-developed. No distress.  HENT:  Head: Normocephalic.  Right Ear: External ear normal.  Left Ear: External ear normal.  Nose: Nose normal.  Mouth/Throat: Oropharynx is clear and moist.  Eyes: Pupils are equal, round, and reactive to light. Conjunctivae are normal. Right eye exhibits no discharge. Left eye exhibits no discharge.  Neck: Normal range of motion. Neck supple. No JVD present. No tracheal deviation present. No thyromegaly present.  Cardiovascular: Normal rate, regular rhythm and normal heart sounds.   Pulmonary/Chest: No stridor. No respiratory distress. She has no wheezes.  Abdominal: Soft. Bowel sounds are normal. She exhibits no distension and no mass. There is no tenderness. There is no rebound and no guarding.  Musculoskeletal: She exhibits tenderness. She exhibits no edema.  Lymphadenopathy:    She has no cervical adenopathy.  Neurological: She displays normal reflexes. No cranial nerve deficit. She  exhibits normal muscle tone. Coordination normal.  Skin: No rash noted. No erythema.  Psychiatric: She has a normal mood and affect. Her behavior is normal. Judgment and thought content normal.    Lab Results  Component Value Date   WBC 5.4 10/07/2015   HGB 13.3 10/07/2015   HCT 38.4 10/07/2015   PLT 241.0 10/07/2015   GLUCOSE 72 07/07/2016   CHOL 228 (H) 10/07/2015   TRIG 114.0  10/07/2015   HDL 48.40 10/07/2015   LDLDIRECT 185.7 01/19/2012   LDLCALC 157 (H) 10/07/2015   ALT 23 10/07/2015   AST 18 10/07/2015   NA 142 07/07/2016   K 3.4 (L) 07/07/2016   CL 106 07/07/2016   CREATININE 0.95 07/07/2016   BUN 12 07/07/2016   CO2 29 07/07/2016   TSH 4.04 06/21/2016   INR 1.8 (H) 10/07/2015   HGBA1C 6.0 08/26/2008    Dg Chest 2 View  Result Date: 04/09/2015 CLINICAL DATA:  Shortness of breath beginning yesterday.  Fatigue. EXAM: CHEST  2 VIEW COMPARISON:  01/23/2014 FINDINGS: The heart size and mediastinal contours are within normal limits. Both lungs are clear. No evidence of pneumothorax or pleural effusion. Mild thoracic spine degenerative changes noted. IMPRESSION: No active cardiopulmonary disease. Electronically Signed   By: Earle Gell M.D.   On: 04/09/2015 17:24   Ct Angio Chest Pe W/cm &/or Wo Cm  Result Date: 04/09/2015 CLINICAL DATA:  Shortness of breath for several weeks. Worse the past 2 days. Tachycardia. Hypotension. EXAM: CT ANGIOGRAPHY CHEST WITH CONTRAST TECHNIQUE: Multidetector CT imaging of the chest was performed using the standard protocol during bolus administration of intravenous contrast. Multiplanar CT image reconstructions and MIPs were obtained to evaluate the vascular anatomy. CONTRAST:  88mL OMNIPAQUE IOHEXOL 350 MG/ML SOLN COMPARISON:  Chest radiographs obtained earlier today. Chest CTA dated 04/18/2009. FINDINGS: Mediastinum/Lymph Nodes: Central right upper lobe pulmonary arterial filling defects. The right ventricular to left ventricular ratio is 1.0. No enlarged lymph nodes. Lungs/Pleura: No pulmonary mass, infiltrate, or effusion. Upper abdomen: No acute findings. Musculoskeletal: Thoracic and upper lumbar spine degenerative changes. Review of the MIP images confirms the above findings. IMPRESSION: Central right upper lobe pulmonary emboli with CT evidence of right heart strain (RV/LV Ratio = 1.0) consistent with at least submassive  (intermediate risk) PE. The presence of right heart strain has been associated with an increased risk of morbidity and mortality. Please activate Code PE by paging [phone]. Critical Value/emergent results were called by telephone at the time of interpretation on 04/09/2015 at 7:57 pm to Dr. Theotis Burrow , who verbally acknowledged these results. Electronically Signed   By: Claudie Revering M.D.   On: 04/09/2015 19:58    Assessment & Plan:   There are no diagnoses linked to this encounter. I am having Ms. Vallee maintain her fexofenadine, fluticasone, triamcinolone ointment, levothyroxine, HYDROcodone-acetaminophen, promethazine-codeine, losartan, amLODipine, and XARELTO.  No orders of the defined types were placed in this encounter.    Follow-up: No Follow-up on file.  Walker Kehr, MD

## 2016-10-08 NOTE — Assessment & Plan Note (Signed)
Losartan, Norvasc 

## 2016-11-10 NOTE — Addendum Note (Signed)
Addended by: Karren Cobble on: 11/10/2016 03:15 PM   Modules accepted: Orders

## 2016-11-23 ENCOUNTER — Other Ambulatory Visit: Payer: Self-pay | Admitting: Internal Medicine

## 2016-11-23 DIAGNOSIS — Z1231 Encounter for screening mammogram for malignant neoplasm of breast: Secondary | ICD-10-CM

## 2016-11-25 ENCOUNTER — Ambulatory Visit
Admission: RE | Admit: 2016-11-25 | Discharge: 2016-11-25 | Disposition: A | Discharge status: Home / Self Care | Payer: BLUE CROSS/BLUE SHIELD | Source: Ambulatory Visit | Attending: Internal Medicine | Admitting: Internal Medicine

## 2016-11-25 DIAGNOSIS — Z1231 Encounter for screening mammogram for malignant neoplasm of breast: Secondary | ICD-10-CM

## 2016-12-21 ENCOUNTER — Other Ambulatory Visit (INDEPENDENT_AMBULATORY_CARE_PROVIDER_SITE_OTHER): Payer: BLUE CROSS/BLUE SHIELD

## 2016-12-21 DIAGNOSIS — E890 Postprocedural hypothyroidism: Secondary | ICD-10-CM

## 2016-12-21 LAB — TSH: TSH: 3.64 u[IU]/mL (ref 0.35–4.50)

## 2016-12-21 LAB — T4, FREE: Free T4: 0.91 ng/dL (ref 0.60–1.60)

## 2016-12-24 ENCOUNTER — Ambulatory Visit: Payer: BLUE CROSS/BLUE SHIELD | Admitting: Endocrinology

## 2016-12-24 ENCOUNTER — Encounter: Payer: Self-pay | Admitting: Endocrinology

## 2016-12-24 VITALS — BP 122/82 | HR 90 | Ht 65.0 in | Wt 183.0 lb

## 2016-12-24 DIAGNOSIS — E890 Postprocedural hypothyroidism: Secondary | ICD-10-CM

## 2016-12-24 NOTE — Progress Notes (Signed)
Patient ID: Ashley Larsen, female   DOB: 11-07-1950, 66 y.o.   MRN: [id number]   Reason for Appointment:  Post ablative hypothyroidism, followup   History of Present Illness:   She had hyperthyroidism first diagnosed in 01/2013 and treated with radioactive iodine on 05/03/13 with 12 mCi She became hypothyroid subsequently and in 6/15 with a free T4 level of 0.57 she was started on 112 mcg Synthroid  Recent history: Her dose was reduced to 88 mcg in 07/2013 because of relatively high free T4 of 1.4 and low TSH   However since her TSH was high in 3/17 associated with fatigue for a couple of months she was changed to 88 g, 7-1/2 tablets weekly She was not remembering to do this regimen well and was therefore changed to 100 g daily. No missed doses recently  Does not have unusual fatigue and no change in her weight She has been mostly followed every 6 months recently  She has been regular with her Synthroid dose before breakfast and has not taken any new vitamins or calcium   Wt Readings from Last 3 Encounters:  12/24/16 183 lb (83 kg)  10/08/16 182 lb (82.6 kg)  07/07/16 183 lb (83 kg)    Her TSH Is about the same at 3.6, previously 4.0  Labs:   Lab Results  Component Value Date   TSH 3.64 12/21/2016   TSH 4.04 06/21/2016   TSH 3.96 04/05/2016   FREET4 0.91 12/21/2016   FREET4 0.87 06/21/2016   FREET4 0.97 04/05/2016        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 Always use your most recent med list.          amLODipine 5 MG tablet Commonly known as:  NORVASC take 1 tablet by mouth  once daily   fexofenadine 180 MG tablet Commonly known as:  ALLEGRA Take 1 tablet (180 mg total) by mouth daily as needed for allergies.   fluticasone 50 MCG/ACT nasal spray Commonly known as:  FLONASE Place 2 sprays into both nostrils daily.   HYDROcodone-acetaminophen 5-325 MG tablet Commonly known as:  NORCO/VICODIN Take 1 tablet by mouth every 8 (eight) hours as needed for severe pain.   levothyroxine 100 MCG tablet Commonly known as:  SYNTHROID, LEVOTHROID Take 1 tablet (100 mcg total) by mouth daily.   losartan 100 MG tablet Commonly known as:  COZAAR Take 1 tablet (100 mg total) by mouth daily.   promethazine-codeine 6.25-10 MG/5ML syrup Commonly known as:  PHENERGAN with CODEINE Take 5 mLs by mouth every 4 (four) hours as needed.   triamcinolone ointment 0.5 % Commonly  known as:  KENALOG Apply 1 application topically 3 (three) times daily.   XARELTO 20 MG Tabs tablet Generic drug:  rivaroxaban take 1 tablet by mouth once daily WITH SUPPER           Past Medical History:  Diagnosis Date  . Allergic rhinitis   . Alopecia   . Asthma   . Cataract    bilateral cateracts  . COPD (chronic obstructive pulmonary disease) ([place])   . Diverticulosis   . GERD (gastroesophageal reflux disease)   . Hypertension   . Hyperthyroidism   . IBS (irritable bowel syndrome)   . Insomnia   . Ovarian cyst   . Paresthesia     Past Surgical History:  Procedure Laterality Date  . COLONOSCOPY    . KNEE ARTHROSCOPY     Left  . OOPHORECTOMY     Left  . ROTATOR CUFF REPAIR    . TUBAL LIGATION      Family History  Problem Relation Age of Onset  . Cancer Brother        brother - stomach ca  . Stomach cancer Brother   . Heart disease Father   . Other Mother        sclerdermia  . Colon cancer Sister   . Hypertension Unknown   . Cancer Sister 78       colon  . Diabetes Sister   . Colon polyps Sister        and Brother  . Cancer Sister 35        colon  . Kidney  disease Brother   . Diabetes Brother   . Diabetes Brother   . Diabetes Paternal Grandfather   . Esophageal cancer Neg Hx   . Rectal cancer Neg Hx   . Breast cancer Neg Hx     Social History:  reports that she has been smoking cigarettes.  She has a 5.00 pack-year smoking history. She uses smokeless tobacco. She reports that she does not drink alcohol or use drugs.  Allergies:  Allergies  Allergen Reactions  . Metronidazole     REACTION: upset stomach  . Oxycodone-Acetaminophen     Pt tolerates Vicodin    Review of Systems:   HYPERTENSION: Has history of high blood pressure  treated with losartan 100 mg daily and amlodipine By her PCP   BP Readings from Last 3 Encounters:  12/24/16 122/82  10/08/16 116/72  07/07/16 128/80       Examination:   BP 122/82   Pulse 90   Ht 5\' 5"  (1.651 m)   Wt 183 lb (83 kg)   SpO2 97%   BMI 30.45 kg/m    Biceps reflexes normal Minimal proptosis of the eyes present without left retraction or asymmetry   Assessment/Plan:   Post-ablative hypothyroidism  She has post ablative hypothyroidism She was switched to 100 g daily on her last visit with no change in TSH  TSH is still relatively on the higher side of normal However subjectively she is doing well She has been very consistent with taking the medication as directed now  She will continue the same regimen of the 100 g daily  Graves ophthalmopathy:   Stable and asymptomatic    Anai Lipson 12/24/2016, 10:43 AM   Note: This office note was prepared with Estate agent. Any transcriptional errors that result from this process are unintentional.

## 2016-12-26 ENCOUNTER — Encounter: Payer: Self-pay | Admitting: Endocrinology

## 2017-01-13 ENCOUNTER — Other Ambulatory Visit (INDEPENDENT_AMBULATORY_CARE_PROVIDER_SITE_OTHER): Payer: BLUE CROSS/BLUE SHIELD

## 2017-01-13 DIAGNOSIS — Z0000 Encounter for general adult medical examination without abnormal findings: Secondary | ICD-10-CM | POA: Diagnosis not present

## 2017-01-13 LAB — URINALYSIS
BILIRUBIN URINE: NEGATIVE
KETONES UR: NEGATIVE
Leukocytes, UA: NEGATIVE
Nitrite: NEGATIVE
PH: 6.5 (ref 5.0–8.0)
SPECIFIC GRAVITY, URINE: 1.02 (ref 1.000–1.030)
TOTAL PROTEIN, URINE-UPE24: NEGATIVE
URINE GLUCOSE: NEGATIVE
Urobilinogen, UA: 0.2 (ref 0.0–1.0)

## 2017-01-13 LAB — BASIC METABOLIC PANEL
BUN: 13 mg/dL (ref 6–23)
CHLORIDE: 107 meq/L (ref 96–112)
CO2: 28 mEq/L (ref 19–32)
CREATININE: 0.88 mg/dL (ref 0.40–1.20)
Calcium: 9.5 mg/dL (ref 8.4–10.5)
GFR: 83.08 mL/min (ref >60.00)
GLUCOSE: 93 mg/dL (ref 70–99)
POTASSIUM: 3.5 meq/L (ref 3.5–5.1)
Sodium: 142 mEq/L (ref 135–145)

## 2017-01-13 LAB — LIPID PANEL
CHOL/HDL RATIO: 5
CHOLESTEROL: 212 mg/dL — AB (ref 0–200)
HDL: 41.3 mg/dL (ref >39.00)
LDL CALC: 150 mg/dL — AB (ref 0–99)
NONHDL: 171
Triglycerides: 104 mg/dL (ref 0.0–149.0)
VLDL: 20.8 mg/dL (ref 0.0–40.0)

## 2017-01-13 LAB — CBC WITH DIFFERENTIAL/PLATELET
BASOS ABS: 0 10*3/uL (ref 0.0–0.1)
BASOS PCT: 0.5 % (ref 0.0–3.0)
EOS ABS: 0.2 10*3/uL (ref 0.0–0.7)
Eosinophils Relative: 3.3 % (ref 0.0–5.0)
HCT: 39.7 % (ref 36.0–46.0)
HEMOGLOBIN: 13.5 g/dL (ref 12.0–15.0)
LYMPHS PCT: 39.4 % (ref 12.0–46.0)
Lymphs Abs: 2.1 10*3/uL (ref 0.7–4.0)
MCHC: 34.1 g/dL (ref 30.0–36.0)
MCV: 89.3 fl (ref 78.0–100.0)
MONO ABS: 0.5 10*3/uL (ref 0.1–1.0)
Monocytes Relative: 9 % (ref 3.0–12.0)
Neutro Abs: 2.6 10*3/uL (ref 1.4–7.7)
Neutrophils Relative %: 47.8 % (ref 43.0–77.0)
Platelets: 260 10*3/uL (ref 150.0–400.0)
RBC: 4.44 Mil/uL (ref 3.87–5.11)
RDW: 13.8 % (ref 11.5–15.5)
WBC: 5.3 10*3/uL (ref 4.0–10.5)

## 2017-01-13 LAB — HEPATIC FUNCTION PANEL
ALK PHOS: 75 U/L (ref 39–117)
ALT: 21 U/L (ref 0–35)
AST: 18 U/L (ref 0–37)
Albumin: 4.2 g/dL (ref 3.5–5.2)
BILIRUBIN DIRECT: 0 mg/dL (ref 0.0–0.3)
BILIRUBIN TOTAL: 0.5 mg/dL (ref 0.2–1.2)
Total Protein: 8 g/dL (ref 6.0–8.3)

## 2017-01-13 LAB — TSH: TSH: 3.01 u[IU]/mL (ref 0.35–4.50)

## 2017-01-14 ENCOUNTER — Encounter: Payer: Self-pay | Admitting: Internal Medicine

## 2017-01-14 ENCOUNTER — Ambulatory Visit (INDEPENDENT_AMBULATORY_CARE_PROVIDER_SITE_OTHER): Payer: BLUE CROSS/BLUE SHIELD | Admitting: Internal Medicine

## 2017-01-14 DIAGNOSIS — E890 Postprocedural hypothyroidism: Secondary | ICD-10-CM | POA: Diagnosis not present

## 2017-01-14 DIAGNOSIS — M5440 Lumbago with sciatica, unspecified side: Secondary | ICD-10-CM | POA: Diagnosis not present

## 2017-01-14 DIAGNOSIS — I10 Essential (primary) hypertension: Secondary | ICD-10-CM | POA: Diagnosis not present

## 2017-01-14 DIAGNOSIS — G8929 Other chronic pain: Secondary | ICD-10-CM

## 2017-01-14 DIAGNOSIS — K219 Gastro-esophageal reflux disease without esophagitis: Secondary | ICD-10-CM

## 2017-01-14 MED ORDER — HYDROCODONE-ACETAMINOPHEN 5-325 MG PO TABS: 1.0000 | ORAL_TABLET | Freq: Three times a day (TID) | ORAL | 0 refills | Status: DC | PRN

## 2017-01-14 MED ORDER — RANITIDINE HCL 150 MG PO TABS
150.0000 mg | ORAL_TABLET | Freq: Two times a day (BID) | ORAL | 11 refills | Status: DC
Start: 2017-01-14 — End: 2019-07-11

## 2017-01-14 NOTE — Assessment & Plan Note (Signed)
Ranitidine prn 

## 2017-01-14 NOTE — Assessment & Plan Note (Signed)
On Levothroid 

## 2017-01-14 NOTE — Progress Notes (Signed)
Subjective:  Patient ID: Ashley Larsen, female    DOB: 06-15-1950  Age: 66 y.o. MRN: [id number]  CC: No chief complaint on file.   HPI Nyashia Raney presents for HTN, hypothyroidism, OA - fell at church on [place] a while ago, had more pains  Outpatient Medications Prior to Visit  Medication Sig Dispense Refill  . amLODipine (NORVASC) 5 MG tablet take 1 tablet by mouth once daily 90 tablet 2  . fexofenadine (ALLEGRA) 180 MG tablet Take 1 tablet (180 mg total) by mouth daily as needed for allergies. 90 tablet 3  . fluticasone (FLONASE) 50 MCG/ACT nasal spray Place 2 sprays into both nostrils daily. 16 g 6  . HYDROcodone-acetaminophen (NORCO/VICODIN) 5-325 MG tablet Take 1 tablet by mouth every 8 (eight) hours as needed for severe pain. 90 tablet 0  . levothyroxine (SYNTHROID, LEVOTHROID) 100 MCG tablet Take 1 tablet (100 mcg total) by mouth daily. 90 tablet 3  . losartan (COZAAR) 100 MG tablet Take 1 tablet (100 mg total) by mouth daily. 90 tablet 3  . promethazine-codeine (PHENERGAN WITH CODEINE) 6.25-10 MG/5ML syrup Take 5 mLs by mouth every 4 (four) hours as needed. 300 mL 0  . triamcinolone ointment (KENALOG) 0.5 % Apply 1 application topically 3 (three) times daily. 30 g 1  . XARELTO 20 MG TABS tablet take 1 tablet by mouth once daily WITH SUPPER 30 tablet 5   No facility-administered medications prior to visit.     ROS Review of Systems  Constitutional: Negative for activity change, appetite change, chills, fatigue and unexpected weight change.  HENT: Negative for congestion, mouth sores and sinus pressure.   Eyes: Negative for visual disturbance.  Respiratory: Negative for cough and chest tightness.   Gastrointestinal: Negative for abdominal pain and nausea.  Genitourinary: Negative for difficulty urinating, frequency and vaginal pain.  Musculoskeletal: Positive for arthralgias and back pain. Negative for gait problem.  Skin: Negative for pallor and rash.  Neurological:  Negative for dizziness, tremors, weakness, numbness and headaches.  Psychiatric/Behavioral: Negative for confusion and sleep disturbance.    Objective:  BP 118/74 (BP Location: Left Arm, Patient Position: Sitting, Cuff Size: Large)   Pulse 84   Temp 98.3 F (36.8 C) (Oral)   Ht 5\' 5"  (1.651 m)   Wt 181 lb (82.1 kg)   SpO2 99%   BMI 30.12 kg/m   BP Readings from Last 3 Encounters:  01/14/17 118/74  12/24/16 122/82  10/08/16 116/72    Wt Readings from Last 3 Encounters:  01/14/17 181 lb (82.1 kg)  12/24/16 183 lb (83 kg)  10/08/16 182 lb (82.6 kg)    Physical Exam  Constitutional: She appears well-developed. No distress.  HENT:  Head: Normocephalic.  Right Ear: External ear normal.  Left Ear: External ear normal.  Nose: Nose normal.  Mouth/Throat: Oropharynx is clear and moist.  Eyes: Conjunctivae are normal. Pupils are equal, round, and reactive to light. Right eye exhibits no discharge. Left eye exhibits no discharge.  Neck: Normal range of motion. Neck supple. No JVD present. No tracheal deviation present. No thyromegaly present.  Cardiovascular: Normal rate, regular rhythm and normal heart sounds.  Pulmonary/Chest: No stridor. No respiratory distress. She has no wheezes.  Abdominal: Soft. Bowel sounds are normal. She exhibits no distension and no mass. There is no tenderness. There is no rebound and no guarding.  Musculoskeletal: She exhibits tenderness. She exhibits no edema.  Lymphadenopathy:    She has no cervical adenopathy.  Neurological: She displays  normal reflexes. No cranial nerve deficit. She exhibits normal muscle tone. Coordination normal.  Skin: No rash noted. No erythema.  Psychiatric: She has a normal mood and affect. Her behavior is normal. Judgment and thought content normal.  LS tender  Lab Results  Component Value Date   WBC 5.3 01/13/2017   HGB 13.5 01/13/2017   HCT 39.7 01/13/2017   PLT 260.0 01/13/2017   GLUCOSE 93 01/13/2017   CHOL 212  (H) 01/13/2017   TRIG 104.0 01/13/2017   HDL 41.30 01/13/2017   LDLDIRECT 185.7 01/19/2012   LDLCALC 150 (H) 01/13/2017   ALT 21 01/13/2017   AST 18 01/13/2017   NA 142 01/13/2017   K 3.5 01/13/2017   CL 107 01/13/2017   CREATININE 0.88 01/13/2017   BUN 13 01/13/2017   CO2 28 01/13/2017   TSH 3.01 01/13/2017   INR 1.8 (H) 10/07/2015   HGBA1C 6.0 08/26/2008    Mm Digital Screening Bilateral  Result Date: 11/26/2016 CLINICAL DATA:  Screening. EXAM: DIGITAL SCREENING BILATERAL MAMMOGRAM WITH CAD COMPARISON:  Previous exam(s). ACR Breast Density Category c: The breast tissue is heterogeneously dense, which may obscure small masses. FINDINGS: There are no findings suspicious for malignancy. Images were processed with CAD. IMPRESSION: No mammographic evidence of malignancy. A result letter of this screening mammogram will be mailed directly to the patient. RECOMMENDATION: Screening mammogram in one year. (Code:SM-B-01Y) BI-RADS CATEGORY  1: Negative. Electronically Signed   By: Lovey Newcomer M.D.   On: 11/26/2016 16:27    Assessment & Plan:   There are no diagnoses linked to this encounter. I am having Julious Payer maintain her fexofenadine, fluticasone, triamcinolone ointment, levothyroxine, amLODipine, XARELTO, losartan, promethazine-codeine, and HYDROcodone-acetaminophen.  No orders of the defined types were placed in this encounter.    Follow-up: No Follow-up on file.  Walker Kehr, MD

## 2017-01-14 NOTE — Assessment & Plan Note (Signed)
Norco prn  Potential benefits of a long term opioids use as well as potential risks (i.e. addiction risk, apnea etc) and complications (i.e. Somnolence, constipation and others) were explained to the patient and were aknowledged. 

## 2017-01-14 NOTE — Assessment & Plan Note (Signed)
Losartan, Norvasc 

## 2017-01-20 ENCOUNTER — Telehealth: Payer: Self-pay | Admitting: Internal Medicine

## 2017-01-20 NOTE — Telephone Encounter (Signed)
Copied from [place] [id number]. Topic: Quick Communication - See Telephone Encounter >> Jan 20, 2017  3:07 PM Synthia Innocent wrote: CRM for notification. See Telephone encounter for:  Patient was seen last Friday with Dr Alain Marion, now has right earache and sinus drainage. Can something be called in or does she need to be seen again 01/20/17.

## 2017-01-26 NOTE — Telephone Encounter (Signed)
Left message for pt. To call back and discuss symptoms.

## 2017-03-17 ENCOUNTER — Other Ambulatory Visit: Payer: Self-pay | Admitting: Internal Medicine

## 2017-03-18 ENCOUNTER — Other Ambulatory Visit: Payer: Self-pay | Admitting: Internal Medicine

## 2017-03-22 ENCOUNTER — Encounter: Payer: Self-pay | Admitting: Gastroenterology

## 2017-04-15 ENCOUNTER — Encounter: Payer: Self-pay | Admitting: Internal Medicine

## 2017-04-15 ENCOUNTER — Ambulatory Visit: Payer: BLUE CROSS/BLUE SHIELD | Admitting: Internal Medicine

## 2017-04-15 DIAGNOSIS — I10 Essential (primary) hypertension: Secondary | ICD-10-CM | POA: Diagnosis not present

## 2017-04-15 DIAGNOSIS — G8929 Other chronic pain: Secondary | ICD-10-CM

## 2017-04-15 DIAGNOSIS — F17200 Nicotine dependence, unspecified, uncomplicated: Secondary | ICD-10-CM | POA: Diagnosis not present

## 2017-04-15 DIAGNOSIS — E890 Postprocedural hypothyroidism: Secondary | ICD-10-CM | POA: Diagnosis not present

## 2017-04-15 DIAGNOSIS — I2699 Other pulmonary embolism without acute cor pulmonale: Secondary | ICD-10-CM | POA: Diagnosis not present

## 2017-04-15 DIAGNOSIS — M5440 Lumbago with sciatica, unspecified side: Secondary | ICD-10-CM

## 2017-04-15 MED ORDER — FLUTICASONE PROPIONATE 50 MCG/ACT NA SUSP: 2.0000 | Freq: Every day | NASAL | 6 refills | Status: DC

## 2017-04-15 MED ORDER — FEXOFENADINE HCL 180 MG PO TABS: 180.0000 mg | ORAL_TABLET | Freq: Every day | ORAL | 3 refills | Status: DC | PRN

## 2017-04-15 MED ORDER — HYDROCODONE-ACETAMINOPHEN 5-325 MG PO TABS: 1.0000 | ORAL_TABLET | Freq: Three times a day (TID) | ORAL | 0 refills | Status: DC | PRN

## 2017-04-15 NOTE — Progress Notes (Signed)
Subjective:  Patient ID: Ashley Larsen, female    DOB: 1950/12/03  Age: 67 y.o. MRN: [id number]  CC: No chief complaint on file.   HPI Ashley Larsen presents for cough/COPD, HTN, hypothyroidism, LBP  Outpatient Medications Prior to Visit  Medication Sig Dispense Refill  . amLODipine (NORVASC) 5 MG tablet take 1 tablet by mouth once daily 90 tablet 2  . fexofenadine (ALLEGRA) 180 MG tablet Take 1 tablet (180 mg total) by mouth daily as needed for allergies. 90 tablet 3  . fluticasone (FLONASE) 50 MCG/ACT nasal spray Place 2 sprays into both nostrils daily. 16 g 6  . HYDROcodone-acetaminophen (NORCO/VICODIN) 5-325 MG tablet Take 1 tablet by mouth every 8 (eight) hours as needed for severe pain. 90 tablet 0  . levothyroxine (SYNTHROID, LEVOTHROID) 100 MCG tablet Take 1 tablet (100 mcg total) by mouth daily. 90 tablet 3  . losartan (COZAAR) 100 MG tablet Take 1 tablet (100 mg total) by mouth daily. 90 tablet 3  . Promethazine-Codeine 6.25-10 MG/5ML SOLN take 5 milliliters by mouth every 4 hours if needed 300 mL 0  . ranitidine (ZANTAC) 150 MG tablet Take 1 tablet (150 mg total) by mouth 2 (two) times daily. 60 tablet 11  . triamcinolone ointment (KENALOG) 0.5 % Apply 1 application topically 3 (three) times daily. 30 g 1  . XARELTO 20 MG TABS tablet take 1 tablet by mouth once daily WITH SUPPER 30 tablet 11   No facility-administered medications prior to visit.     ROS Review of Systems  Constitutional: Negative for activity change, appetite change, chills, fatigue and unexpected weight change.  HENT: Negative for congestion, mouth sores and sinus pressure.   Eyes: Negative for visual disturbance.  Respiratory: Negative for cough, chest tightness and shortness of breath.   Gastrointestinal: Negative for abdominal pain and nausea.  Genitourinary: Negative for difficulty urinating, frequency and vaginal pain.  Musculoskeletal: Positive for arthralgias and back pain. Negative for gait  problem.  Skin: Negative for pallor and rash.  Neurological: Negative for dizziness, tremors, weakness, numbness and headaches.  Psychiatric/Behavioral: Negative for confusion, sleep disturbance and suicidal ideas.    Objective:  BP 118/66 (BP Location: Left Arm, Patient Position: Sitting, Cuff Size: Large)   Pulse 78   Temp 98.2 F (36.8 C) (Oral)   Ht 5\' 5"  (1.651 m)   Wt 184 lb (83.5 kg)   SpO2 99%   BMI 30.62 kg/m   BP Readings from Last 3 Encounters:  04/15/17 118/66  01/14/17 118/74  12/24/16 122/82    Wt Readings from Last 3 Encounters:  04/15/17 184 lb (83.5 kg)  01/14/17 181 lb (82.1 kg)  12/24/16 183 lb (83 kg)    Physical Exam  Constitutional: She appears well-developed. No distress.  HENT:  Head: Normocephalic.  Right Ear: External ear normal.  Left Ear: External ear normal.  Nose: Nose normal.  Mouth/Throat: Oropharynx is clear and moist.  Eyes: Conjunctivae are normal. Pupils are equal, round, and reactive to light. Right eye exhibits no discharge. Left eye exhibits no discharge.  Neck: Normal range of motion. Neck supple. No JVD present. No tracheal deviation present. No thyromegaly present.  Cardiovascular: Normal rate, regular rhythm and normal heart sounds.  Pulmonary/Chest: No stridor. No respiratory distress. She has no wheezes.  Abdominal: Soft. Bowel sounds are normal. She exhibits no distension and no mass. There is no tenderness. There is no rebound and no guarding.  Musculoskeletal: She exhibits tenderness. She exhibits no edema.  Lymphadenopathy:  She has no cervical adenopathy.  Neurological: She displays normal reflexes. No cranial nerve deficit. She exhibits normal muscle tone. Coordination normal.  Skin: No rash noted. No erythema.  Psychiatric: She has a normal mood and affect. Her behavior is normal. Judgment and thought content normal.    Lab Results  Component Value Date   WBC 5.3 01/13/2017   HGB 13.5 01/13/2017   HCT 39.7  01/13/2017   PLT 260.0 01/13/2017   GLUCOSE 93 01/13/2017   CHOL 212 (H) 01/13/2017   TRIG 104.0 01/13/2017   HDL 41.30 01/13/2017   LDLDIRECT 185.7 01/19/2012   LDLCALC 150 (H) 01/13/2017   ALT 21 01/13/2017   AST 18 01/13/2017   NA 142 01/13/2017   K 3.5 01/13/2017   CL 107 01/13/2017   CREATININE 0.88 01/13/2017   BUN 13 01/13/2017   CO2 28 01/13/2017   TSH 3.01 01/13/2017   INR 1.8 (H) 10/07/2015   HGBA1C 6.0 08/26/2008    Mm Digital Screening Bilateral  Result Date: 11/26/2016 CLINICAL DATA:  Screening. EXAM: DIGITAL SCREENING BILATERAL MAMMOGRAM WITH CAD COMPARISON:  Previous exam(s). ACR Breast Density Category c: The breast tissue is heterogeneously dense, which may obscure small masses. FINDINGS: There are no findings suspicious for malignancy. Images were processed with CAD. IMPRESSION: No mammographic evidence of malignancy. A result letter of this screening mammogram will be mailed directly to the patient. RECOMMENDATION: Screening mammogram in one year. (Code:SM-B-01Y) BI-RADS CATEGORY  1: Negative. Electronically Signed   By: Lovey Newcomer M.D.   On: 11/26/2016 16:27    Assessment & Plan:   There are no diagnoses linked to this encounter. I am having Julious Payer maintain her fexofenadine, fluticasone, triamcinolone ointment, levothyroxine, amLODipine, losartan, HYDROcodone-acetaminophen, ranitidine, Promethazine-Codeine, and XARELTO.  No orders of the defined types were placed in this encounter.    Follow-up: No Follow-up on file.  Walker Kehr, MD

## 2017-04-15 NOTE — Assessment & Plan Note (Signed)
Losartan, Norvasc 

## 2017-04-15 NOTE — Assessment & Plan Note (Signed)
Norco prn  Potential benefits of a long term opioids use as well as potential risks (i.e. addiction risk, apnea etc) and complications (i.e. Somnolence, constipation and others) were explained to the patient and were aknowledged. 

## 2017-04-17 NOTE — Assessment & Plan Note (Signed)
On Levothroid 

## 2017-04-17 NOTE — Assessment & Plan Note (Signed)
Xarelto

## 2017-04-17 NOTE — Assessment & Plan Note (Signed)
Discussed.

## 2017-05-16 ENCOUNTER — Telehealth: Payer: Self-pay | Admitting: Emergency Medicine

## 2017-05-16 ENCOUNTER — Ambulatory Visit: Payer: BLUE CROSS/BLUE SHIELD | Admitting: Physician Assistant

## 2017-05-16 ENCOUNTER — Encounter: Payer: Self-pay | Admitting: Physician Assistant

## 2017-05-16 VITALS — BP 110/60 | HR 82 | Ht 65.0 in | Wt 184.0 lb

## 2017-05-16 DIAGNOSIS — Z800 Family history of malignant neoplasm of digestive organs: Secondary | ICD-10-CM | POA: Diagnosis not present

## 2017-05-16 DIAGNOSIS — Z01818 Encounter for other preprocedural examination: Secondary | ICD-10-CM

## 2017-05-16 DIAGNOSIS — Z86010 Personal history of colonic polyps: Secondary | ICD-10-CM

## 2017-05-16 DIAGNOSIS — Z7901 Long term (current) use of anticoagulants: Secondary | ICD-10-CM

## 2017-05-16 NOTE — Progress Notes (Signed)
Chief Complaint: Previsit for a surveillance colonoscopy in a patient on chronic anticoagulation  HPI:    Ashley Larsen is a 67 year old African-American female with a past medical history as listed below, who follows with Dr. Fuller Plan and who was referred to me by [name], [name], MD for consult of a surveillance colonoscopy on chronic anticoagulation.      02/28/14 colonoscopy Dr. Fuller Plan done for family history of colon cancer in her 2 sisters, finding 2 sessile polyps in the sigmoid and transverse colon as well as mild diverticulosis.  Pathology showed tubular adenomas, repeat recommended in 3 years.    Today, explains she was started on Xarelto after PE in 2017, no further issues since then.  She has had to hold this medication before for dental procedures.  Does describe occasional right lower quadrant discomfort, but this is fleeting and typically related to "eating bad food".    Denies weight loss, fever, chills, blood in her stool, melena, anorexia, nausea, vomiting, heartburn or reflux.  Past Medical History:  Diagnosis Date  . Allergic rhinitis   . Alopecia   . Asthma   . Cataract    bilateral cateracts  . COPD (chronic obstructive pulmonary disease) ([place])   . Diverticulosis   . GERD (gastroesophageal reflux disease)   . Hypertension   . Hyperthyroidism   . IBS (irritable bowel syndrome)   . Insomnia   . Ovarian cyst   . Paresthesia     Past Surgical History:  Procedure Laterality Date  . COLONOSCOPY    . KNEE ARTHROSCOPY     Left  . OOPHORECTOMY     Left  . ROTATOR CUFF REPAIR    . TUBAL LIGATION      Current Outpatient Medications  Medication Sig Dispense Refill  . amLODipine (NORVASC) 5 MG tablet take 1 tablet by mouth once daily 90 tablet 2  . fexofenadine (ALLEGRA) 180 MG tablet Take 1 tablet (180 mg total) by mouth daily as needed for allergies. 90 tablet 3  . fluticasone (FLONASE) 50 MCG/ACT nasal spray Place 2 sprays into both nostrils daily. 16 g 6  .  HYDROcodone-acetaminophen (NORCO/VICODIN) 5-325 MG tablet Take 1 tablet by mouth every 8 (eight) hours as needed for severe pain. 90 tablet 0  . levothyroxine (SYNTHROID, LEVOTHROID) 100 MCG tablet Take 1 tablet (100 mcg total) by mouth daily. 90 tablet 3  . losartan (COZAAR) 100 MG tablet Take 1 tablet (100 mg total) by mouth daily. 90 tablet 3  . Promethazine-Codeine 6.25-10 MG/5ML SOLN take 5 milliliters by mouth every 4 hours if needed 300 mL 0  . ranitidine (ZANTAC) 150 MG tablet Take 1 tablet (150 mg total) by mouth 2 (two) times daily. 60 tablet 11  . triamcinolone ointment (KENALOG) 0.5 % Apply 1 application topically 3 (three) times daily. 30 g 1  . XARELTO 20 MG TABS tablet take 1 tablet by mouth once daily WITH SUPPER 30 tablet 11   No current facility-administered medications for this visit.     Allergies as of 05/16/2017 - Review Complete 05/16/2017  Allergen Reaction Noted  . Metronidazole  01/19/2007  . Oxycodone-acetaminophen  10/20/2010    Family History  Problem Relation Age of Onset  . Cancer Brother        brother - stomach ca  . Stomach cancer Brother   . Heart disease Father   . Other Mother        sclerdermia  . Colon cancer Sister   . Hypertension Unknown   .  Cancer Sister 43       colon  . Diabetes Sister   . Colon polyps Sister        and Brother  . Cancer Sister 40        colon  . Kidney disease Brother   . Diabetes Brother   . Diabetes Brother   . Diabetes Paternal Grandfather   . Esophageal cancer Neg Hx   . Rectal cancer Neg Hx   . Breast cancer Neg Hx     Social History   Socioeconomic History  . Marital status: Married    Spouse name: Not on file  . Number of children: Not on file  . Years of education: Not on file  . Highest education level: Not on file  Occupational History  . Occupation: Financial trader: [employer]  . Financial resource strain: Not on file  . Food insecurity:    Worry: Not on file     Inability: Not on file  . Transportation needs:    Medical: Not on file    Non-medical: Not on file  Tobacco Use  . Smoking status: Light Tobacco Smoker    Packs/day: 0.25    Years: 20.00    Pack years: 5.00    Types: Cigarettes  . Smokeless tobacco: Current User  . Tobacco comment: smoked 1 cig in 30 days-- 05/02/15  Substance and Sexual Activity  . Alcohol use: No    Alcohol/week: 0.0 oz  . Drug use: No  . Sexual activity: Yes  Lifestyle  . Physical activity:    Days per week: Not on file    Minutes per session: Not on file  . Stress: Not on file  Relationships  . Social connections:    Talks on phone: Not on file    Gets together: Not on file    Attends religious service: Not on file    Active member of club or organization: Not on file    Attends meetings of clubs or organizations: Not on file    Relationship status: Not on file  . Intimate partner violence:    Fear of current or ex partner: Not on file    Emotionally abused: Not on file    Physically abused: Not on file    Forced sexual activity: Not on file  Other Topics Concern  . Not on file  Social History Narrative  . Not on file    Review of Systems:    Constitutional: No weight loss, fever or chills Skin: No rash Cardiovascular: No chest pain Respiratory: No SOB Gastrointestinal: See HPI and otherwise negative Genitourinary: No dysuria Neurological: No headache Musculoskeletal: No new muscle or joint pain Hematologic: No bleeding Psychiatric: No history of depression or anxiety   Physical Exam:  Vital signs: BP 110/60   Pulse 82   Ht 5\' 5"  (1.651 m)   Wt 184 lb (83.5 kg)   BMI 30.62 kg/m   Constitutional:   Pleasant AA female appears to be in NAD, Well developed, Well nourished, alert and cooperative Head:  Normocephalic and atraumatic. Eyes:   PEERL, EOMI. No icterus. Conjunctiva pink. Ears:  Normal auditory acuity. Neck:  Supple Throat: Oral cavity and pharynx without inflammation,  swelling or lesion.  Respiratory: Respirations even and unlabored. Lungs clear to auscultation bilaterally.   No wheezes, crackles, or rhonchi.  Cardiovascular: Normal S1, S2. No MRG. Regular rate and rhythm. No peripheral edema, cyanosis or pallor.  Gastrointestinal:  Soft,  nondistended, nontender. No rebound or guarding. Normal bowel sounds. No appreciable masses or hepatomegaly. Rectal:  Not performed.  Msk:  Symmetrical without gross deformities. Without edema, no deformity or joint abnormality.  Neurologic:  Alert and  oriented x4;  grossly normal neurologically.  Skin:   Dry and intact without significant lesions or rashes. Psychiatric:  Demonstrates good judgement and reason without abnormal affect or behaviors.  No recent labs or imaging.  Assessment: 1.  Surveillance colonoscopy: Due to polyps 2.  Personal history of colon polyps: 2 tubular adenomas in 2016 3.  Family history of colon cancer: In her 2 sisters 4.  Chronic anticoagulation: On Xarelto for PE diagnosed 2017  Plan: 1.  Scheduled patient for surveillance colonoscopy due to her history of colon polyps and family history of colon cancer with Dr. Fuller Plan in the [facility].  Did discuss risk, benefits, limitations and alternatives and the patient agrees to proceed. 2.  Patient advised to hold her Xarelto for 2 days prior to time of her procedure.  We will consult Dr. Alain Marion to ensure that holding her Xarelto is acceptable. 3.  Patient to follow in clinic per recommendations from Dr. Fuller Plan after time of procedure.  Ellouise Newer, PA-C [facility] 05/16/2017, 10:11 AM  Cc: Cassandria Anger, MD

## 2017-05-16 NOTE — Telephone Encounter (Signed)
   Ashley Larsen 20-Dec-1950 [id number]  Dear   We have scheduled the above named patient for a colonoscopy procedure. Our records show that she is on anticoagulation therapy.  Please advise as to whether the patient may come off their therapy of Xarelto 2 days prior to their procedure which is scheduled for 07-06-17.  Please route your response to Tinnie Gens, CMA or fax response to [phone].  Sincerely,    [facility]

## 2017-05-16 NOTE — Patient Instructions (Signed)

## 2017-05-16 NOTE — Progress Notes (Signed)
Reviewed and agree with management plan.  Jayvin Hurrell T. Kaikoa Magro, MD FACG 

## 2017-05-18 NOTE — Telephone Encounter (Signed)
   Primary Cardiologist: NOT FOLLOWED BY [facility]  This pt appears to have a h/o PE and is thus on oral anticoagulation.  She is neither followed by a [facility] cardiologist or our anticoagulation clinic.  Please reach out to her primary care provider for anticoagulation assistance.  Murray Hodgkins, NP 05/18/2017, 3:48 PM

## 2017-05-26 ENCOUNTER — Telehealth: Payer: Self-pay | Admitting: Internal Medicine

## 2017-05-26 MED ORDER — AMLODIPINE BESYLATE 5 MG PO TABS: 5.0000 mg | ORAL_TABLET | Freq: Every day | ORAL | 2 refills | Status: DC

## 2017-05-26 NOTE — Telephone Encounter (Signed)
Copied from [place] [id number]. Topic: Quick Communication - Rx Refill/Question >> May 26, 2017  3:09 PM Ahmed Prima L wrote: Medication: amLODipine (NORVASC) 5 MG tablet Has the patient contacted their pharmacy? Yes last week and they told her it had no refills and they would fax the office on 4/5. They told her they still have not heard from the office (Agent: If no, request that the patient contact the pharmacy for the refill.) Preferred Pharmacy (with phone number or street name): [facility] [phone] - [place], [place]: Please be advised that RX refills may take up to 3 business days. We ask that you follow-up with your pharmacy.

## 2017-05-26 NOTE — Telephone Encounter (Signed)
RX sent

## 2017-05-27 NOTE — Telephone Encounter (Signed)
Ashley Larsen, It is ok to hold Xarelto per your protocol Thx, AP

## 2017-05-30 NOTE — Telephone Encounter (Signed)
Spoke to patient and informed her that she needs to stop Xarelto 2 days before procedure and then resume afterwards per recs at discharge. Patient verbalized understanding and will stop 07-04-17.

## 2017-06-27 ENCOUNTER — Encounter: Payer: Self-pay | Admitting: Gastroenterology

## 2017-07-04 ENCOUNTER — Other Ambulatory Visit: Payer: Self-pay

## 2017-07-04 ENCOUNTER — Telehealth: Payer: Self-pay | Admitting: Gastroenterology

## 2017-07-04 MED ORDER — PEG 3350-KCL-NABCB-NACL-NASULF 236 G PO SOLR: 4000.0000 mL | Freq: Once | ORAL | 0 refills | Status: AC

## 2017-07-04 MED ORDER — NA SULFATE-K SULFATE-MG SULF 17.5-3.13-1.6 GM/177ML PO SOLN: 1.0000 | Freq: Once | ORAL | 0 refills | Status: DC

## 2017-07-04 NOTE — Telephone Encounter (Signed)
Informed patient that we do not attempt PA's on prep kits since they are never covered by insurance companies. Offered patient alternative Golytely prep since it is cheaper. Patient states she is concerned because it is more liquid she has to drink. Informed patient that it is more liquid but it's also cheaper. Patient states she would like me to switch her to Golytely. Prep sent to the pharmacy and instructions put on my chart. Informed patient to contact me if she has questions regarding her prep. Patient verbalized understanding.

## 2017-07-06 ENCOUNTER — Encounter: Payer: Self-pay | Admitting: Gastroenterology

## 2017-07-06 ENCOUNTER — Ambulatory Visit (AMBULATORY_SURGERY_CENTER): Payer: BLUE CROSS/BLUE SHIELD | Admitting: Gastroenterology

## 2017-07-06 ENCOUNTER — Other Ambulatory Visit: Payer: Self-pay

## 2017-07-06 VITALS — BP 115/73 | HR 72 | Temp 98.2°F | Resp 15 | Ht 65.0 in | Wt 184.0 lb

## 2017-07-06 DIAGNOSIS — D124 Benign neoplasm of descending colon: Secondary | ICD-10-CM

## 2017-07-06 DIAGNOSIS — Z86010 Personal history of colonic polyps: Secondary | ICD-10-CM

## 2017-07-06 DIAGNOSIS — Z800 Family history of malignant neoplasm of digestive organs: Secondary | ICD-10-CM | POA: Diagnosis not present

## 2017-07-06 DIAGNOSIS — K635 Polyp of colon: Secondary | ICD-10-CM | POA: Diagnosis not present

## 2017-07-06 DIAGNOSIS — D125 Benign neoplasm of sigmoid colon: Secondary | ICD-10-CM

## 2017-07-06 MED ORDER — SODIUM CHLORIDE 0.9 % IV SOLN: 500.0000 mL | Freq: Once | INTRAVENOUS | Status: DC

## 2017-07-06 NOTE — Patient Instructions (Signed)
YOU HAD AN ENDOSCOPIC PROCEDURE TODAY AT [facility]:   Refer to the procedure report that was given to you for any specific questions about what was found during the examination.  If the procedure report does not answer your questions, please call your gastroenterologist to clarify.  If you requested that your care partner not be given the details of your procedure findings, then the procedure report has been included in a sealed envelope for you to review at your convenience later.  YOU SHOULD EXPECT: Some feelings of bloating in the abdomen. Passage of more gas than usual.  Walking can help get rid of the air that was put into your GI tract during the procedure and reduce the bloating. If you had a lower endoscopy (such as a colonoscopy or flexible sigmoidoscopy) you may notice spotting of blood in your stool or on the toilet paper. If you underwent a bowel prep for your procedure, you may not have a normal bowel movement for a few days.  Please Note:  You might notice some irritation and congestion in your nose or some drainage.  This is from the oxygen used during your procedure.  There is no need for concern and it should clear up in a day or so.  SYMPTOMS TO REPORT IMMEDIATELY:   Following lower endoscopy (colonoscopy or flexible sigmoidoscopy):  Excessive amounts of blood in the stool  Significant tenderness or worsening of abdominal pains  Swelling of the abdomen that is new, acute  Fever of 100F or higher  For urgent or emergent issues, a gastroenterologist can be reached at any hour by calling [phone].  Please see handouts on polyps and hemorrhoids.    Resume Xarelto tomorrow 07/07/2017.  DIET:  We do recommend a small meal at first, but then you may proceed to your regular diet.  Drink plenty of fluids but you should avoid alcoholic beverages for 24 hours.  ACTIVITY:  You should plan to take it easy for the rest of today and you should NOT DRIVE or use heavy  machinery until tomorrow (because of the sedation medicines used during the test).    FOLLOW UP: Our staff will call the number listed on your records the next business day following your procedure to check on you and address any questions or concerns that you may have regarding the information given to you following your procedure. If we do not reach you, we will leave a message.  However, if you are feeling well and you are not experiencing any problems, there is no need to return our call.  We will assume that you have returned to your regular daily activities without incident.  If any biopsies were taken you will be contacted by phone or by letter within the next 1-3 weeks.  Please call us at [phone] if you have not heard about the biopsies in 3 weeks.    SIGNATURES/CONFIDENTIALITY: You and/or your care partner have signed paperwork which will be entered into your electronic medical record.  These signatures attest to the fact that that the information above on your After Visit Summary has been reviewed and is understood.  Full responsibility of the confidentiality of this discharge information lies with you and/or your care-partner.   Thank you for allowing [place] to provide your healthcare today.

## 2017-07-06 NOTE — Op Note (Addendum)
[facility] Patient Name: Ashley Larsen Procedure Date: 07/06/2017 9:06 AM MRN: [id number] Endoscopist: Ladene Artist , MD Age: 67 Referring MD:  Date of Birth: 03-Jun-1950 Gender: Female Account #: [id number] Procedure:                Colonoscopy Indications:              High risk colon cancer surveillance: Personal                            history of sessile serrated colon polyp (less than                            10 mm in size) with no dysplasia. Family history of                            colon cancer - 2 sisters. Medicines:                Monitored Anesthesia Care Procedure:                Pre-Anesthesia Assessment:                           - Prior to the procedure, a History and Physical                            was performed, and patient medications and                            allergies were reviewed. The patient's tolerance of                            previous anesthesia was also reviewed. The risks                            and benefits of the procedure and the sedation                            options and risks were discussed with the patient.                            All questions were answered, and informed consent                            was obtained. Prior Anticoagulants: The patient has                            taken Xarelto (rivaroxaban), last dose was 2 days                            prior to procedure. ASA Grade Assessment: III - A                            patient with severe systemic disease. After  reviewing the risks and benefits, the patient was                            deemed in satisfactory condition to undergo the                            procedure.                           After obtaining informed consent, the colonoscope                            was passed under direct vision. Throughout the                            procedure, the patient's blood pressure, pulse, and            oxygen saturations were monitored continuously. The                            Colonoscope was introduced through the anus and                            advanced to the the cecum, identified by                            appendiceal orifice and ileocecal valve. The                            ileocecal valve, appendiceal orifice, and rectum                            were photographed. The quality of the bowel                            preparation was good. The colonoscopy was performed                            without difficulty. The patient tolerated the                            procedure well. Scope In: 9:08:47 AM Scope Out: 9:23:05 AM Scope Withdrawal Time: 0 hours 12 minutes 34 seconds  Total Procedure Duration: 0 hours 14 minutes 18 seconds  Findings:                 The perianal and digital rectal examinations were                            normal.                           Five sessile polyps were found in the sigmoid colon                            (4) and descending colon (1). The polyps  were 5 to                            7 mm in size. These polyps were removed with a cold                            snare. Resection and retrieval were complete.                           Multiple medium-mouthed diverticula were found in                            the left colon. There was no evidence of                            diverticular bleeding.                           Multiple small-mouthed diverticula were found in                            the right colon. There was no evidence of                            diverticular bleeding.                           Internal hemorrhoids were found during                            retroflexion. The hemorrhoids were small and Grade                            I (internal hemorrhoids that do not prolapse).                           The exam was otherwise without abnormality on                            direct and retroflexion  views. Complications:            No immediate complications. Estimated blood loss:                            None. Estimated Blood Loss:     Estimated blood loss: none. Impression:               - Five 5 to 7 mm polyps in the sigmoid colon and in                            the descending colon, removed with a cold snare.                            Resected and retrieved.                           -  Moderate left colon diverticulosis.                           - Mild right colon diverticulosis.                           - Internal hemorrhoids.                           - The examination was otherwise normal on direct                            and retroflexion views. Recommendation:           - Repeat colonoscopy in 3 years for surveillance.                           - Patient has a contact number available for                            emergencies. The signs and symptoms of potential                            delayed complications were discussed with the                            patient. Return to normal activities tomorrow.                            Written discharge instructions were provided to the                            patient.                           - High fiber diet.                           - Continue present medications.                           - Await pathology results.                           - Resume Xarelto (rivaroxaban) at prior dose                            tomorrow. Refer to managing physician for further                            adjustment of therapy. Ladene Artist, MD 07/06/2017 9:30:06 AM This report has been signed electronically.

## 2017-07-06 NOTE — Progress Notes (Signed)
Report given to PACU, vss 

## 2017-07-06 NOTE — Progress Notes (Signed)
Called to room to assist during endoscopic procedure.  Patient ID and intended procedure confirmed with present staff. Received instructions for my participation in the procedure from the performing physician.  

## 2017-07-07 ENCOUNTER — Telehealth: Payer: Self-pay | Admitting: *Deleted

## 2017-07-07 NOTE — Telephone Encounter (Signed)
  Follow up Call-  Call back number 07/06/2017  Post procedure Call Back phone  # [phone] cell  Permission to leave phone message Yes  Some recent data might be hidden     Patient questions:  Do you have a fever, pain , or abdominal swelling? No. Pain Score  0 *  Have you tolerated food without any problems? Yes.    Have you been able to return to your normal activities? Yes.    Do you have any questions about your discharge instructions: Diet   No. Medications  No. Follow up visit  No.  Do you have questions or concerns about your Care? No.  Actions: * If pain score is 4 or above: No action needed, pain <4.

## 2017-07-14 ENCOUNTER — Telehealth: Payer: Self-pay | Admitting: Internal Medicine

## 2017-07-14 NOTE — Telephone Encounter (Signed)
Copied from [place] [id number]. Topic: Quick Communication - See Telephone Encounter >> Jul 14, 2017  4:20 PM Ether Griffins B wrote: CRM for notification. See Telephone encounter for: 07/14/17.  Pt requesting refill on HYDROcodone-acetaminophen (NORCO/VICODIN) 5-325 MG tablet. She would like a hard copy.

## 2017-07-15 MED ORDER — HYDROCODONE-ACETAMINOPHEN 5-325 MG PO TABS: 1.0000 | ORAL_TABLET | Freq: Three times a day (TID) | ORAL | 0 refills | Status: DC | PRN

## 2017-07-15 NOTE — Telephone Encounter (Signed)
Will put Rx on your desk- thanks

## 2017-07-15 NOTE — Telephone Encounter (Signed)
Checked controlled substance database, last filled 04/16/17.

## 2017-07-15 NOTE — Telephone Encounter (Signed)
Pt notified RX ready to be picked up

## 2017-07-18 ENCOUNTER — Encounter: Payer: Self-pay | Admitting: Gastroenterology

## 2017-07-20 ENCOUNTER — Encounter: Payer: Self-pay | Admitting: Gastroenterology

## 2017-07-26 ENCOUNTER — Ambulatory Visit (INDEPENDENT_AMBULATORY_CARE_PROVIDER_SITE_OTHER)
Admission: RE | Admit: 2017-07-26 | Discharge: 2017-07-26 | Disposition: A | Discharge status: Home / Self Care | Payer: Medicare Other | Source: Ambulatory Visit | Attending: Family | Admitting: Family

## 2017-07-26 ENCOUNTER — Encounter: Payer: Self-pay | Admitting: Family

## 2017-07-26 ENCOUNTER — Ambulatory Visit (INDEPENDENT_AMBULATORY_CARE_PROVIDER_SITE_OTHER): Payer: Medicare Other | Admitting: Family

## 2017-07-26 VITALS — BP 112/70 | HR 94 | Temp 98.9°F | Ht 65.0 in | Wt 184.0 lb

## 2017-07-26 DIAGNOSIS — R059 Cough, unspecified: Secondary | ICD-10-CM

## 2017-07-26 DIAGNOSIS — R05 Cough: Secondary | ICD-10-CM | POA: Diagnosis not present

## 2017-07-26 DIAGNOSIS — J209 Acute bronchitis, unspecified: Secondary | ICD-10-CM

## 2017-07-26 MED ORDER — DOXYCYCLINE HYCLATE 100 MG PO TABS: 100.0000 mg | ORAL_TABLET | Freq: Two times a day (BID) | ORAL | 0 refills | Status: DC

## 2017-07-26 MED ORDER — FLUTICASONE FUROATE-VILANTEROL 100-25 MCG/INH IN AEPB: 1.0000 | INHALATION_SPRAY | Freq: Every day | RESPIRATORY_TRACT | Status: DC

## 2017-07-26 MED ORDER — PROMETHAZINE-CODEINE 6.25-10 MG/5ML PO SOLN: 5.0000 mL | Freq: Four times a day (QID) | ORAL | 0 refills | Status: DC
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
## 2017-08-12 ENCOUNTER — Other Ambulatory Visit: Payer: Self-pay | Admitting: Endocrinology

## 2017-08-15 ENCOUNTER — Encounter: Payer: Self-pay | Admitting: Internal Medicine

## 2017-08-15 ENCOUNTER — Ambulatory Visit (INDEPENDENT_AMBULATORY_CARE_PROVIDER_SITE_OTHER): Payer: Medicare Other | Admitting: Internal Medicine

## 2017-08-15 DIAGNOSIS — I2699 Other pulmonary embolism without acute cor pulmonale: Secondary | ICD-10-CM

## 2017-08-15 DIAGNOSIS — J449 Chronic obstructive pulmonary disease, unspecified: Secondary | ICD-10-CM | POA: Diagnosis not present

## 2017-08-15 DIAGNOSIS — M5440 Lumbago with sciatica, unspecified side: Secondary | ICD-10-CM | POA: Diagnosis not present

## 2017-08-15 DIAGNOSIS — K219 Gastro-esophageal reflux disease without esophagitis: Secondary | ICD-10-CM | POA: Diagnosis not present

## 2017-08-15 DIAGNOSIS — G8929 Other chronic pain: Secondary | ICD-10-CM

## 2017-08-15 MED ORDER — FLUTICASONE FUROATE-VILANTEROL 100-25 MCG/INH IN AEPB: 1.0000 | INHALATION_SPRAY | Freq: Every day | RESPIRATORY_TRACT | 11 refills | Status: DC

## 2017-08-15 MED ORDER — METHYLPREDNISOLONE ACETATE 80 MG/ML IJ SUSP
80.0000 mg | Freq: Once | INTRAMUSCULAR | Status: AC
  Administered 2017-08-15: 80 mg via INTRAMUSCULAR

## 2017-08-15 NOTE — Assessment & Plan Note (Signed)
Cont Breo Depo-medrol 80 mg IM

## 2017-08-15 NOTE — Assessment & Plan Note (Signed)
Ranitidine prn

## 2017-08-15 NOTE — Progress Notes (Signed)
Subjective:  Patient ID: Ashley Larsen, female    DOB: 1950-06-15  Age: 67 y.o. MRN: [id number]  CC: No chief complaint on file.   HPI Ashley Larsen presents for HTN, COPD, hypothyroidism f/u. C/o cough. Took abx for cough starting 6/11. Dtr was hospitalized w/pneumonia C/o Xarelto $400 first copay, then $40/mo  Outpatient Medications Prior to Visit  Medication Sig Dispense Refill  . amLODipine (NORVASC) 5 MG tablet Take 1 tablet (5 mg total) by mouth daily. 90 tablet 2  . fexofenadine (ALLEGRA) 180 MG tablet Take 1 tablet (180 mg total) by mouth daily as needed for allergies. 90 tablet 3  . fluticasone (FLONASE) 50 MCG/ACT nasal spray Place 2 sprays into both nostrils daily. 16 g 6  . fluticasone furoate-vilanterol (BREO ELLIPTA) 100-25 MCG/INH AEPB Inhale 1 puff into the lungs daily.    [name] HYDROcodone-acetaminophen (NORCO/VICODIN) 5-325 MG tablet Take 1 tablet by mouth every 8 (eight) hours as needed for severe pain. 90 tablet 0  . levothyroxine (SYNTHROID, LEVOTHROID) 100 MCG tablet TAKE 1 TABLET BY MOUTH ONCE DAILY 90 tablet 1  . losartan (COZAAR) 100 MG tablet Take 1 tablet (100 mg total) by mouth daily. 90 tablet 3  . Olopatadine HCl 0.2 % SOLN INSTILL 1 DROP INTO OU QD  6  . Promethazine-Codeine 6.25-10 MG/5ML SOLN Take 5 mLs by mouth every 6 (six) hours. 75 mL 0  . ranitidine (ZANTAC) 150 MG tablet Take 1 tablet (150 mg total) by mouth 2 (two) times daily. 60 tablet 11  . triamcinolone ointment (KENALOG) 0.5 % Apply 1 application topically 3 (three) times daily. 30 g 1  . XARELTO 20 MG TABS tablet take 1 tablet by mouth once daily WITH SUPPER 30 tablet 11  . doxycycline (VIBRA-TABS) 100 MG tablet Take 1 tablet (100 mg total) by mouth 2 (two) times daily. (Patient not taking: Reported on 08/15/2017) 20 tablet 0  . GAVILYTE-G 236 g solution TAKE 4000 ML BY MOUTH FOR 1 DOSE AS DIRECTED  0   Facility-Administered Medications Prior to Visit  Medication Dose Route Frequency Provider  Last Rate Last Dose  . 0.9 %  sodium chloride infusion  500 mL Intravenous Once Ladene Artist, MD        ROS: Review of Systems  Constitutional: Negative for activity change, appetite change, chills, fatigue and unexpected weight change.  HENT: Negative for congestion, mouth sores and sinus pressure.   Eyes: Negative for visual disturbance.  Respiratory: Positive for cough and wheezing. Negative for chest tightness.   Gastrointestinal: Negative for abdominal pain and nausea.  Genitourinary: Negative for difficulty urinating, frequency and vaginal pain.  Musculoskeletal: Negative for back pain and gait problem.  Skin: Negative for pallor and rash.  Neurological: Negative for dizziness, tremors, weakness, numbness and headaches.  Psychiatric/Behavioral: Negative for confusion and sleep disturbance.    Objective:  BP 112/70 (BP Location: Left Arm, Patient Position: Sitting, Cuff Size: Large)   Pulse 88   Ht 5\' 5"  (1.651 m)   Wt 184 lb (83.5 kg)   SpO2 95%   BMI 30.62 kg/m   BP Readings from Last 3 Encounters:  08/15/17 112/70  07/26/17 112/70  07/06/17 115/73    Wt Readings from Last 3 Encounters:  08/15/17 184 lb (83.5 kg)  07/26/17 184 lb (83.5 kg)  07/06/17 184 lb (83.5 kg)    Physical Exam  Constitutional: She appears well-developed. No distress.  HENT:  Head: Normocephalic.  Right Ear: External ear normal.  Left Ear: External  ear normal.  Nose: Nose normal.  Mouth/Throat: Oropharynx is clear and moist.  Eyes: Pupils are equal, round, and reactive to light. Conjunctivae are normal. Right eye exhibits no discharge. Left eye exhibits no discharge.  Neck: Normal range of motion. Neck supple. No JVD present. No tracheal deviation present. No thyromegaly present.  Cardiovascular: Normal rate, regular rhythm and normal heart sounds.  Pulmonary/Chest: No stridor. No respiratory distress. She has no wheezes.  Abdominal: Soft. Bowel sounds are normal. She exhibits no  distension and no mass. There is no tenderness. There is no rebound and no guarding.  Musculoskeletal: She exhibits no edema or tenderness.  Lymphadenopathy:    She has no cervical adenopathy.  Neurological: She displays normal reflexes. No cranial nerve deficit. She exhibits normal muscle tone. Coordination normal.  Skin: No rash noted. No erythema.  Psychiatric: She has a normal mood and affect. Her behavior is normal. Judgment and thought content normal.    Lab Results  Component Value Date   WBC 5.3 01/13/2017   HGB 13.5 01/13/2017   HCT 39.7 01/13/2017   PLT 260.0 01/13/2017   GLUCOSE 93 01/13/2017   CHOL 212 (H) 01/13/2017   TRIG 104.0 01/13/2017   HDL 41.30 01/13/2017   LDLDIRECT 185.7 01/19/2012   LDLCALC 150 (H) 01/13/2017   ALT 21 01/13/2017   AST 18 01/13/2017   NA 142 01/13/2017   K 3.5 01/13/2017   CL 107 01/13/2017   CREATININE 0.88 01/13/2017   BUN 13 01/13/2017   CO2 28 01/13/2017   TSH 3.01 01/13/2017   INR 1.8 (H) 10/07/2015   HGBA1C 6.0 08/26/2008    Dg Chest 2 View  Result Date: 07/27/2017 CLINICAL DATA:  Cough EXAM: CHEST - 2 VIEW COMPARISON:  04/11/2015 chest radiograph. FINDINGS: Stable cardiomediastinal silhouette with normal heart size. No pneumothorax. No pleural effusion. Lungs appear clear, with no acute consolidative airspace disease and no pulmonary edema. IMPRESSION: No active cardiopulmonary disease. Electronically Signed   By: Ilona Sorrel M.D.   On: 07/27/2017 08:09    Assessment & Plan:   There are no diagnoses linked to this encounter.   No orders of the defined types were placed in this encounter.    Follow-up: No follow-ups on file.  Walker Kehr, MD

## 2017-08-15 NOTE — Addendum Note (Signed)
Addended by: Cresenciano Lick on: 08/15/2017 03:55 PM   Modules accepted: Orders

## 2017-08-15 NOTE — Patient Instructions (Signed)
Schedule PAP smear

## 2017-08-15 NOTE — Assessment & Plan Note (Signed)
Xarelto $400 first copay, then $40/mo The pt will stay on Xarelto. Declined Coumadin

## 2017-08-15 NOTE — Assessment & Plan Note (Signed)
Norco prn 

## 2017-09-12 ENCOUNTER — Other Ambulatory Visit: Payer: Self-pay | Admitting: Internal Medicine

## 2017-10-18 ENCOUNTER — Telehealth: Payer: Self-pay | Admitting: Internal Medicine

## 2017-10-18 NOTE — Telephone Encounter (Signed)
Copied from [place] [id number]. Topic: Quick Communication - Rx Refill/Question >> Oct 18, 2017 12:47 PM Selinda Flavin B, NT wrote: Medication: HYDROcodone-acetaminophen (NORCO/VICODIN) 5-325 MG tablet  Has the patient contacted their pharmacy? Yes.   (Agent: If no, request that the patient contact the pharmacy for the refill.) (Agent: If yes, when and what did the pharmacy advise?)  Preferred Pharmacy (with phone number or street name): [id number] - [place], [place]: Please be advised that RX refills may take up to 3 business days. We ask that you follow-up with your pharmacy.

## 2017-10-18 NOTE — Telephone Encounter (Signed)
Hydrocodone refill Last Refill:07/15/17 #90 by Rosann Auerbach Last OV: 08/15/17 PCP: Dr. Alain Marion Pharmacy:[facility] [address]

## 2017-10-20 MED ORDER — HYDROCODONE-ACETAMINOPHEN 5-325 MG PO TABS: 1.0000 | ORAL_TABLET | Freq: Three times a day (TID) | ORAL | 0 refills | Status: DC | PRN

## 2017-10-20 NOTE — Telephone Encounter (Signed)
I cannot find the RX you printed, please advise

## 2017-10-20 NOTE — Telephone Encounter (Signed)
Pt called and stated that pharmacy needs a printed RX. Please advise

## 2017-10-24 NOTE — Telephone Encounter (Signed)
Patient checking status, please advise [phone]

## 2017-10-24 NOTE — Telephone Encounter (Signed)
Pt notified RX ready to pick up 

## 2017-10-27 ENCOUNTER — Telehealth: Payer: Self-pay | Admitting: Internal Medicine

## 2017-10-27 NOTE — Telephone Encounter (Signed)
Cozar 100mg  refill Last Refill:10/08/16 # 90 Last OV: 10/08/16 PCP: [name] Pharmacy:[id number]    Patient due for Annual

## 2017-10-27 NOTE — Telephone Encounter (Signed)
Copied from [place] [id number]. Topic: Quick Communication - Rx Refill/Question >> Oct 27, 2017  3:23 PM Reyne Dumas L wrote: Medication: losartan (COZAAR) 100 MG tablet  Has the patient contacted their pharmacy? Yes - states they haven't heard from [place] (Agent: If no, request that the patient contact the pharmacy for the refill.) (Agent: If yes, when and what did the pharmacy advise?)  Preferred Pharmacy (with phone number or street name): [id number] [address], [place] [phone] (Phone) [phone] (Fax)  Agent: Please be advised that RX refills may take up to 3 business days. We ask that you follow-up with your pharmacy.

## 2017-10-28 MED ORDER — LOSARTAN POTASSIUM 100 MG PO TABS: 100.0000 mg | ORAL_TABLET | Freq: Every day | ORAL | 3 refills | Status: DC

## 2017-10-28 NOTE — Telephone Encounter (Signed)
RX sent

## 2017-10-28 NOTE — Addendum Note (Signed)
Addended by: Karren Cobble on: 10/28/2017 03:05 PM   Modules accepted: Orders

## 2017-12-01 ENCOUNTER — Telehealth: Payer: Self-pay

## 2017-12-01 NOTE — Telephone Encounter (Signed)
Key: [id number]

## 2017-12-06 ENCOUNTER — Other Ambulatory Visit: Payer: Self-pay | Admitting: Internal Medicine

## 2017-12-06 MED ORDER — AMLODIPINE BESYLATE 5 MG PO TABS: 5.0000 mg | ORAL_TABLET | Freq: Every day | ORAL | 0 refills | Status: DC

## 2017-12-06 NOTE — Telephone Encounter (Signed)
Copied from [place] [id number]. Topic: Quick Communication - Rx Refill/Question >> Dec 06, 2017  4:44 PM Sheran Luz wrote: Medication: amLODipine (NORVASC) 5 MG tablet   Pt is requesting a refill of this medication, pt states she is completely out.   Preferred Pharmacy (with phone number or street name): [id number] [address], [place] [phone] (Phone) [phone] (Fax)

## 2017-12-06 NOTE — Telephone Encounter (Signed)
Spoke with pharmacy; no refills available.

## 2017-12-16 NOTE — Telephone Encounter (Signed)
PA not required.

## 2017-12-19 ENCOUNTER — Ambulatory Visit (INDEPENDENT_AMBULATORY_CARE_PROVIDER_SITE_OTHER): Payer: Medicare Other | Admitting: Internal Medicine

## 2017-12-19 ENCOUNTER — Encounter: Payer: Self-pay | Admitting: Internal Medicine

## 2017-12-19 VITALS — BP 118/72 | HR 84 | Temp 97.8°F | Ht 65.0 in | Wt 183.0 lb

## 2017-12-19 DIAGNOSIS — J449 Chronic obstructive pulmonary disease, unspecified: Secondary | ICD-10-CM

## 2017-12-19 DIAGNOSIS — I2699 Other pulmonary embolism without acute cor pulmonale: Secondary | ICD-10-CM | POA: Diagnosis not present

## 2017-12-19 DIAGNOSIS — E890 Postprocedural hypothyroidism: Secondary | ICD-10-CM | POA: Diagnosis not present

## 2017-12-19 DIAGNOSIS — M5440 Lumbago with sciatica, unspecified side: Secondary | ICD-10-CM

## 2017-12-19 DIAGNOSIS — Z23 Encounter for immunization: Secondary | ICD-10-CM

## 2017-12-19 DIAGNOSIS — G8929 Other chronic pain: Secondary | ICD-10-CM

## 2017-12-19 MED ORDER — AMLODIPINE BESYLATE 5 MG PO TABS: 5.0000 mg | ORAL_TABLET | Freq: Every day | ORAL | 3 refills | Status: DC

## 2017-12-19 MED ORDER — RIVAROXABAN 20 MG PO TABS: ORAL_TABLET | ORAL | 3 refills | Status: DC

## 2017-12-19 MED ORDER — LEVOTHYROXINE SODIUM 100 MCG PO TABS
100.0000 ug | ORAL_TABLET | Freq: Every day | ORAL | 3 refills | Status: DC
Start: 2017-12-19 — End: 2018-02-10

## 2017-12-19 MED ORDER — HYDROCODONE-ACETAMINOPHEN 5-325 MG PO TABS: 1.0000 | ORAL_TABLET | Freq: Three times a day (TID) | ORAL | 0 refills | Status: DC | PRN

## 2017-12-19 NOTE — Assessment & Plan Note (Signed)
levothroid po

## 2017-12-19 NOTE — Assessment & Plan Note (Signed)
Breo  Do not smoke

## 2017-12-19 NOTE — Assessment & Plan Note (Signed)
Norco prn  Potential benefits of a long term opioids use as well as potential risks (i.e. addiction risk, apnea etc) and complications (i.e. Somnolence, constipation and others) were explained to the patient and were aknowledged. 

## 2017-12-19 NOTE — Assessment & Plan Note (Signed)
Xarelto po
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
## 2017-12-20 ENCOUNTER — Other Ambulatory Visit (INDEPENDENT_AMBULATORY_CARE_PROVIDER_SITE_OTHER): Payer: Medicare Other

## 2017-12-20 DIAGNOSIS — E890 Postprocedural hypothyroidism: Secondary | ICD-10-CM | POA: Diagnosis not present

## 2017-12-20 LAB — TSH: TSH: 1.51 u[IU]/mL (ref 0.35–4.50)

## 2017-12-20 LAB — T4, FREE: Free T4: 0.93 ng/dL (ref 0.60–1.60)

## 2017-12-23 ENCOUNTER — Ambulatory Visit (INDEPENDENT_AMBULATORY_CARE_PROVIDER_SITE_OTHER): Payer: Medicare Other | Admitting: Endocrinology

## 2017-12-23 ENCOUNTER — Encounter: Payer: Self-pay | Admitting: Endocrinology

## 2017-12-23 VITALS — BP 122/72 | HR 89 | Resp 16 | Ht 65.0 in | Wt 184.0 lb

## 2017-12-23 DIAGNOSIS — E890 Postprocedural hypothyroidism: Secondary | ICD-10-CM | POA: Diagnosis not present

## 2017-12-23 DIAGNOSIS — E0500 Thyrotoxicosis with diffuse goiter without thyrotoxic crisis or storm: Secondary | ICD-10-CM

## 2017-12-23 NOTE — Progress Notes (Signed)
Patient ID: Beauty Pless, female   DOB: 04/04/1950, 66 y.o.   MRN: [id number]   Reason for Appointment:  Post ablative hypothyroidism, followup   History of Present Illness:   She had hyperthyroidism first diagnosed in 01/2013 and treated with radioactive iodine on 05/03/13 with 12 mCi She became hypothyroid subsequently and in 6/15 with a free T4 level of 0.57 she was started on 112 mcg Synthroid  Recent history: Her dose was reduced to 88 mcg in 07/2013 because of relatively high free T4 of 1.4 and low TSH   However since her TSH was high in 3/17 associated with fatigue for a couple of months she was changed to 88 g, 7-1/2 tablets weekly She was not remembering to do this regimen well and was therefore changed to 100 g daily. She is taking this consistently and not forgetting to do this in the morning  She feels fairly good with no recent symptoms of fatigue or cold intolerance  She has been regular with her Synthroid dose before breakfast by itself and takes it with water Her weight is about the same   Wt Readings from Last 3 Encounters:  12/23/17 184 lb (83.5 kg)  12/19/17 183 lb (83 kg)  08/15/17 184 lb (83.5 kg)    Her TSH Is normal at 1.51, previously 3   Labs:   Lab Results  Component Value Date   TSH 1.51 12/20/2017   TSH 3.01 01/13/2017   TSH 3.64 12/21/2016   FREET4 0.93 12/20/2017   FREET4 0.91 12/21/2016   FREET4 0.87 06/21/2016        OPHTHALMOPATHY:  She had diplopia improved with strabismus surgery done at [facility], no new symptoms  Does not complain of local dryness or excessive tearing or swelling Still followed by the eye doctor regularly   Allergies as of 12/23/2017      Reactions   Metronidazole    REACTION: upset stomach   Oxycodone-acetaminophen Other (See Comments)   Pt tolerates Vicodin Caused Halucinations      Medication List        Accurate as of 12/23/17 11:03 AM. Always use your most recent med list.          amLODipine 5 MG tablet Commonly known as:  NORVASC Take 1 tablet (5 mg total) by mouth daily.   fexofenadine 180 MG tablet Commonly known as:  ALLEGRA Take 1 tablet (180 mg total) by mouth daily as needed for allergies.   fluticasone 50 MCG/ACT nasal spray Commonly known as:  FLONASE Place 2 sprays into both nostrils daily.   fluticasone furoate-vilanterol 100-25 MCG/INH Aepb Commonly known as:  BREO ELLIPTA Inhale 1 puff into the lungs daily.   HYDROcodone-acetaminophen 5-325 MG tablet Commonly known as:  NORCO/VICODIN Take 1 tablet by mouth every 8 (eight) hours as needed for severe pain.   levothyroxine 100 MCG tablet Commonly known as:  SYNTHROID, LEVOTHROID Take 1 tablet (100 mcg total) by mouth daily.   losartan 100 MG  tablet Commonly known as:  COZAAR Take 1 tablet (100 mg total) by mouth daily.   Olopatadine HCl 0.2 % Soln INSTILL 1 DROP INTO OU QD   promethazine-codeine 6.25-10 MG/5ML syrup Commonly known as:  PHENERGAN with CODEINE TAKE 5 MILLILITERS BY MOUTH EVERY 4 HOURS IF NEEDED   ranitidine 150 MG tablet Commonly known as:  ZANTAC Take 1 tablet (150 mg total) by mouth 2 (two) times daily.   rivaroxaban 20 MG Tabs tablet Commonly known as:  XARELTO take 1 tablet by mouth once daily WITH SUPPER   triamcinolone ointment 0.5 % Commonly known as:  KENALOG Apply 1 application topically 3 (three) times daily.           Past Medical History:  Diagnosis Date  . Allergic rhinitis   . Allergy   . Alopecia   . Arthritis   . Asthma    does not use an inhaler  . Bronchitis   . Cataract    bilateral cateracts  . Clotting disorder ([place])    blood clot in lungs 04/09/2015  . COPD (chronic obstructive pulmonary disease) ([place])   . Diverticulosis   . GERD (gastroesophageal reflux disease)   . Hypertension   . Hyperthyroidism   . IBS (irritable bowel syndrome)   . Insomnia    . Ovarian cyst   . Paresthesia   . Sickle cell anemia (HCC)    trait but not disease    Past Surgical History:  Procedure Laterality Date  . COLONOSCOPY    . KNEE ARTHROSCOPY     Left  . OOPHORECTOMY     Left  . ROTATOR CUFF REPAIR    . TUBAL LIGATION    . UPPER GASTROINTESTINAL ENDOSCOPY    . wisdon teeth extraction      Family History  Problem Relation Age of Onset  . Cancer Brother        brother - stomach ca  . Stomach cancer Brother        dx age 73  . Heart disease Father   . Other Mother        sclerdermia  . Colon cancer Sister        dx in her 76's  . Hypertension Unknown   . Cancer Sister 85       colon  . Diabetes Sister   . Colon polyps Sister        and Brother  . Cancer Sister 90        colon  . Kidney disease Brother   . Diabetes Brother   . Diabetes Brother   . Diabetes Paternal Grandfather   . Esophageal cancer Neg Hx   . Rectal cancer Neg Hx   . Breast cancer Neg Hx     Social History:  reports that she has been smoking cigarettes. She has a 5.00 pack-year smoking history. She has never used smokeless tobacco. She reports that she does not drink alcohol or use drugs.  Allergies:  Allergies  Allergen Reactions  . Metronidazole     REACTION: upset stomach  . Oxycodone-Acetaminophen Other (See Comments)    Pt tolerates Vicodin  Caused Halucinations     Review of Systems:   HYPERTENSION: Has history of high blood pressure  treated with losartan 100 mg daily and amlodipine by her PCP Also previously had a low potassium level  BP Readings from Last 3 Encounters:  12/23/17 122/72  12/19/17 118/72  08/15/17 112/70       Examination:  BP 122/72   Pulse 89   Resp 16   Ht 5\' 5"  (1.651 m)   Wt 184 lb (83.5 kg)   SpO2 98%   BMI 30.62 kg/m   Thyroid exam normal Biceps reflexes normal on the right Mild prominence of the eyes, no conjunctival erythema   Assessment/Plan:   Post-ablative hypothyroidism  She has post  ablative hypothyroidism She continues to be on levothyroxine 100 mcg daily No recent fatigue TSH is excellent at 1.5  She has been very consistent with taking the medication as directed every morning  She will continue the same regimen of the 100 g daily  We will continue to follow her annually  Graves ophthalmopathy:   No new symptoms and continue to be followed by ophthalmologist  Hypertension: Stable follow-up with PCP and will need repeat chemistry because of history of hypokalemia  [name] 12/23/2017, 11:03 AM   Note: This office note was prepared with Dragon voice recognition system technology. Any transcriptional errors that result from this process are unintentional.

## 2018-02-10 ENCOUNTER — Other Ambulatory Visit: Payer: Self-pay | Admitting: Endocrinology

## 2018-03-13 ENCOUNTER — Other Ambulatory Visit: Payer: Self-pay | Admitting: Internal Medicine

## 2018-03-15 ENCOUNTER — Other Ambulatory Visit: Payer: Self-pay | Admitting: Internal Medicine

## 2018-03-15 NOTE — Telephone Encounter (Signed)
Copied from [place]. Topic: Quick Communication - Rx Refill/Question >> Mar 15, 2018  3:05 PM Reyne Dumas L wrote: Medication: HYDROcodone-acetaminophen (NORCO/VICODIN) 5-325 MG tablet  Pt states that she has tried to get this from her pharmacy and they are telling her that she needs a hard script to bring to them.  Please call pt at [phone] when is is ready to be picked up  Preferred Pharmacy (with phone number or street name): [id number] [address], [place] [phone] (Phone) [phone] (Fax)  Agent: Please be advised that RX refills may take up to 3 business days. We ask that you follow-up with your pharmacy.

## 2018-03-15 NOTE — Telephone Encounter (Signed)
Requesting a printed refill for Hydrocodone-acetaminophen 5-325 MG tab. Last refill ordered 12/29/17  #90 tabs with No refills. Requested Prescriptions  Pending Prescriptions Disp Refills  . HYDROcodone-acetaminophen (NORCO/VICODIN) 5-325 MG tablet 90 tablet 0    Sig: Take 1 tablet by mouth every 8 (eight) hours as needed for severe pain.     There is no refill protocol information for this order    Routing to PCP.

## 2018-03-16 MED ORDER — HYDROCODONE-ACETAMINOPHEN 5-325 MG PO TABS: 1.0000 | ORAL_TABLET | Freq: Three times a day (TID) | ORAL | 0 refills | Status: DC | PRN

## 2018-04-18 ENCOUNTER — Encounter: Payer: Self-pay | Admitting: Internal Medicine

## 2018-04-18 ENCOUNTER — Ambulatory Visit (INDEPENDENT_AMBULATORY_CARE_PROVIDER_SITE_OTHER): Payer: Medicare Other | Admitting: Internal Medicine

## 2018-04-18 ENCOUNTER — Ambulatory Visit (INDEPENDENT_AMBULATORY_CARE_PROVIDER_SITE_OTHER)
Admission: RE | Admit: 2018-04-18 | Discharge: 2018-04-18 | Disposition: A | Discharge status: Home / Self Care | Payer: Medicare Other | Source: Ambulatory Visit | Attending: Internal Medicine | Admitting: Internal Medicine

## 2018-04-18 ENCOUNTER — Other Ambulatory Visit (INDEPENDENT_AMBULATORY_CARE_PROVIDER_SITE_OTHER): Payer: Medicare Other

## 2018-04-18 DIAGNOSIS — D649 Anemia, unspecified: Secondary | ICD-10-CM

## 2018-04-18 DIAGNOSIS — M25551 Pain in right hip: Secondary | ICD-10-CM

## 2018-04-18 DIAGNOSIS — G8929 Other chronic pain: Secondary | ICD-10-CM | POA: Insufficient documentation

## 2018-04-18 DIAGNOSIS — S79911A Unspecified injury of right hip, initial encounter: Secondary | ICD-10-CM | POA: Diagnosis not present

## 2018-04-18 DIAGNOSIS — M5440 Lumbago with sciatica, unspecified side: Secondary | ICD-10-CM

## 2018-04-18 LAB — BASIC METABOLIC PANEL
BUN: 15 mg/dL (ref 6–23)
CO2: 28 meq/L (ref 19–32)
CREATININE: 0.84 mg/dL (ref 0.40–1.20)
Calcium: 9.7 mg/dL (ref 8.4–10.5)
Chloride: 105 mEq/L (ref 96–112)
GFR: 82.15 mL/min (ref >60.00)
Glucose, Bld: 97 mg/dL (ref 70–99)
POTASSIUM: 3.5 meq/L (ref 3.5–5.1)
Sodium: 139 mEq/L (ref 135–145)

## 2018-04-18 LAB — CBC WITH DIFFERENTIAL/PLATELET
Basophils Absolute: 0 10*3/uL (ref 0.0–0.1)
Basophils Relative: 0.3 % (ref 0.0–3.0)
EOS ABS: 0.1 10*3/uL (ref 0.0–0.7)
EOS PCT: 2.1 % (ref 0.0–5.0)
HCT: 34.8 % — ABNORMAL LOW (ref 36.0–46.0)
Hemoglobin: 11.7 g/dL — ABNORMAL LOW (ref 12.0–15.0)
Lymphocytes Relative: 31.5 % (ref 12.0–46.0)
Lymphs Abs: 2 10*3/uL (ref 0.7–4.0)
MCHC: 33.7 g/dL (ref 30.0–36.0)
MCV: 83.2 fl (ref 78.0–100.0)
MONO ABS: 0.5 10*3/uL (ref 0.1–1.0)
Monocytes Relative: 7.9 % (ref 3.0–12.0)
Neutro Abs: 3.7 10*3/uL (ref 1.4–7.7)
Neutrophils Relative %: 58.2 % (ref 43.0–77.0)
Platelets: 283 10*3/uL (ref 150.0–400.0)
RBC: 4.18 Mil/uL (ref 3.87–5.11)
RDW: 15.4 % (ref 11.5–15.5)
WBC: 6.4 10*3/uL (ref 4.0–10.5)

## 2018-04-18 NOTE — Assessment & Plan Note (Signed)
CBC

## 2018-04-18 NOTE — Assessment & Plan Note (Addendum)
s/p fall 1.5 weeks ago X ray Rice bag Norco prn Pt declined PT/Ortho ref

## 2018-04-18 NOTE — Progress Notes (Signed)
Subjective:  Patient ID: Ashley Larsen, female    DOB: 24-Sep-1950  Age: 68 y.o. MRN: [id number]  CC: No chief complaint on file.   HPI Ashley Larsen presents for 4 mo f/u C/a fall 1.5 wks ago at home - hurt R hip/thigh/groin   Outpatient Medications Prior to Visit  Medication Sig Dispense Refill  . amLODipine (NORVASC) 5 MG tablet Take 1 tablet (5 mg total) by mouth daily. 90 tablet 3  . fexofenadine (ALLEGRA) 180 MG tablet Take 1 tablet (180 mg total) by mouth daily as needed for allergies. 90 tablet 3  . fluticasone (FLONASE) 50 MCG/ACT nasal spray Place 2 sprays into both nostrils daily. 16 g 6  . fluticasone furoate-vilanterol (BREO ELLIPTA) 100-25 MCG/INH AEPB Inhale 1 puff into the lungs daily. 1 each 11  . HYDROcodone-acetaminophen (NORCO/VICODIN) 5-325 MG tablet Take 1 tablet by mouth every 8 (eight) hours as needed for severe pain. 90 tablet 0  . levothyroxine (SYNTHROID, LEVOTHROID) 100 MCG tablet TAKE 1 TABLET BY MOUTH ONCE DAILY 90 tablet 3  . losartan (COZAAR) 100 MG tablet Take 1 tablet (100 mg total) by mouth daily. 90 tablet 3  . Olopatadine HCl 0.2 % SOLN INSTILL 1 DROP INTO OU QD  6  . promethazine-codeine (PHENERGAN WITH CODEINE) 6.25-10 MG/5ML syrup TAKE 5 MILLILITERS BY MOUTH EVERY 4 HOURS IF NEEDED 300 mL 0  . rivaroxaban (XARELTO) 20 MG TABS tablet take 1 tablet by mouth once daily WITH SUPPER 90 tablet 3  . triamcinolone ointment (KENALOG) 0.5 % Apply 1 application topically 3 (three) times daily. 30 g 1  . ranitidine (ZANTAC) 150 MG tablet Take 1 tablet (150 mg total) by mouth 2 (two) times daily. 60 tablet 11   Facility-Administered Medications Prior to Visit  Medication Dose Route Frequency Provider Last Rate Last Dose  . 0.9 %  sodium chloride infusion  500 mL Intravenous Once Ladene Artist, MD        ROS: Review of Systems  Constitutional: Negative for activity change, appetite change, chills, fatigue and unexpected weight change.  HENT: Negative  for congestion, mouth sores and sinus pressure.   Eyes: Negative for visual disturbance.  Respiratory: Negative for cough and chest tightness.   Gastrointestinal: Negative for abdominal pain and nausea.  Genitourinary: Negative for difficulty urinating, frequency and vaginal pain.  Musculoskeletal: Positive for arthralgias, back pain and gait problem.  Skin: Negative for pallor and rash.  Neurological: Negative for dizziness, tremors, weakness, numbness and headaches.  Psychiatric/Behavioral: Negative for confusion and sleep disturbance.    Objective:  BP 128/74 (BP Location: Left Arm, Patient Position: Sitting, Cuff Size: Normal)   Pulse 88   Temp 97.7 F (36.5 C) (Oral)   Ht 5\' 5"  (1.651 m)   Wt 184 lb (83.5 kg)   SpO2 98%   BMI 30.62 kg/m   BP Readings from Last 3 Encounters:  04/18/18 128/74  12/23/17 122/72  12/19/17 118/72    Wt Readings from Last 3 Encounters:  04/18/18 184 lb (83.5 kg)  12/23/17 184 lb (83.5 kg)  12/19/17 183 lb (83 kg)    Physical Exam Constitutional:      General: She is not in acute distress.    Appearance: She is well-developed.  HENT:     Head: Normocephalic.     Right Ear: External ear normal.     Left Ear: External ear normal.     Nose: Nose normal.  Eyes:     General:  Right eye: No discharge.        Left eye: No discharge.     Conjunctiva/sclera: Conjunctivae normal.     Pupils: Pupils are equal, round, and reactive to light.  Neck:     Musculoskeletal: Normal range of motion and neck supple.     Thyroid: No thyromegaly.     Vascular: No JVD.     Trachea: No tracheal deviation.  Cardiovascular:     Rate and Rhythm: Normal rate and regular rhythm.     Heart sounds: Normal heart sounds.  Pulmonary:     Effort: No respiratory distress.     Breath sounds: No stridor. No wheezing.  Abdominal:     General: Bowel sounds are normal. There is no distension.     Palpations: Abdomen is soft. There is no mass.     Tenderness:  There is no abdominal tenderness. There is no guarding or rebound.  Musculoskeletal:        General: Tenderness present.  Lymphadenopathy:     Cervical: No cervical adenopathy.  Skin:    Findings: No erythema or rash.  Neurological:     Cranial Nerves: No cranial nerve deficit.     Motor: No abnormal muscle tone.     Coordination: Coordination normal.     Gait: Gait normal.     Deep Tendon Reflexes: Reflexes normal.  Psychiatric:        Behavior: Behavior normal.        Thought Content: Thought content normal.        Judgment: Judgment normal.    R hip/groin w/pain w/ROM, palpation   Lab Results  Component Value Date   WBC 5.3 01/13/2017   HGB 13.5 01/13/2017   HCT 39.7 01/13/2017   PLT 260.0 01/13/2017   GLUCOSE 93 01/13/2017   CHOL 212 (H) 01/13/2017   TRIG 104.0 01/13/2017   HDL 41.30 01/13/2017   LDLDIRECT 185.7 01/19/2012   LDLCALC 150 (H) 01/13/2017   ALT 21 01/13/2017   AST 18 01/13/2017   NA 142 01/13/2017   K 3.5 01/13/2017   CL 107 01/13/2017   CREATININE 0.88 01/13/2017   BUN 13 01/13/2017   CO2 28 01/13/2017   TSH 1.51 12/20/2017   INR 1.8 (H) 10/07/2015   HGBA1C 6.0 08/26/2008    Dg Chest 2 View  Result Date: 07/27/2017 CLINICAL DATA:  Cough EXAM: CHEST - 2 VIEW COMPARISON:  04/11/2015 chest radiograph. FINDINGS: Stable cardiomediastinal silhouette with normal heart size. No pneumothorax. No pleural effusion. Lungs appear clear, with no acute consolidative airspace disease and no pulmonary edema. IMPRESSION: No active cardiopulmonary disease. Electronically Signed   By: Ilona Sorrel M.D.   On: 07/27/2017 08:09    Assessment & Plan:   There are no diagnoses linked to this encounter.   No orders of the defined types were placed in this encounter.    Follow-up: No follow-ups on file.  Walker Kehr, MD

## 2018-04-18 NOTE — Assessment & Plan Note (Addendum)
CBC Norco prn  Potential benefits of a long term opioids use as well as potential risks (i.e. addiction risk, apnea etc) and complications (i.e. Somnolence, constipation and others) were explained to the patient and were aknowledged.

## 2018-04-19 LAB — IRON,TIBC AND FERRITIN PANEL
%SAT: 15 % (calc) — ABNORMAL LOW (ref 16–45)
Ferritin: 11 ng/mL — ABNORMAL LOW (ref 16–288)
Iron: 51 ug/dL (ref 45–160)
TIBC: 345 ug/dL (ref 250–450)

## 2018-04-20 ENCOUNTER — Ambulatory Visit: Payer: Medicare Other | Admitting: Internal Medicine

## 2018-05-11 ENCOUNTER — Other Ambulatory Visit: Payer: Self-pay | Admitting: Internal Medicine

## 2018-05-11 NOTE — Telephone Encounter (Signed)
[place] Controlled Database Checked Last filled: 03/16/18 # 90 LOV w/you: 04/18/18 Next appt w/you: 05/18/18

## 2018-05-12 ENCOUNTER — Other Ambulatory Visit: Payer: Self-pay | Admitting: Internal Medicine

## 2018-05-12 NOTE — Telephone Encounter (Signed)
Copied from [place] [phone]. Topic: Quick Communication - Rx Refill/Question >> May 12, 2018 12:25 PM Blase Mess A wrote: Medication: HYDROcodone-acetaminophen (NORCO/VICODIN) 5-325 MG tablet [[id number]]   Has the patient contacted their pharmacy? Yes  (Agent: If no, request that the patient contact the pharmacy for the refill.) (Agent: If yes, when and what did the pharmacy advise?)  Preferred Pharmacy (with phone number or street name):   [id number] [address], [place] [phone] (Phone) [phone] (Fax)    Agent: Please be advised that RX refills may take up to 3 business days. We ask that you follow-up with your pharmacy.

## 2018-05-14 MED ORDER — HYDROCODONE-ACETAMINOPHEN 5-325 MG PO TABS: 1.0000 | ORAL_TABLET | Freq: Three times a day (TID) | ORAL | 0 refills | Status: DC | PRN

## 2018-05-18 ENCOUNTER — Encounter: Payer: Self-pay | Admitting: Internal Medicine

## 2018-05-18 ENCOUNTER — Ambulatory Visit (INDEPENDENT_AMBULATORY_CARE_PROVIDER_SITE_OTHER): Payer: Medicare Other | Admitting: Internal Medicine

## 2018-05-18 DIAGNOSIS — G8929 Other chronic pain: Secondary | ICD-10-CM | POA: Diagnosis not present

## 2018-05-18 DIAGNOSIS — J301 Allergic rhinitis due to pollen: Secondary | ICD-10-CM | POA: Diagnosis not present

## 2018-05-18 DIAGNOSIS — I2699 Other pulmonary embolism without acute cor pulmonale: Secondary | ICD-10-CM | POA: Diagnosis not present

## 2018-05-18 DIAGNOSIS — M5440 Lumbago with sciatica, unspecified side: Secondary | ICD-10-CM | POA: Diagnosis not present

## 2018-05-18 DIAGNOSIS — I10 Essential (primary) hypertension: Secondary | ICD-10-CM

## 2018-05-18 NOTE — Assessment & Plan Note (Signed)
Allegra prn

## 2018-05-18 NOTE — Assessment & Plan Note (Signed)
Norco prn  Potential benefits of a long term opioids use as well as potential risks (i.e. addiction risk, apnea etc) and complications (i.e. Somnolence, constipation and others) were explained to the patient and were aknowledged. 

## 2018-05-18 NOTE — Assessment & Plan Note (Signed)
Xarelto

## 2018-05-18 NOTE — Assessment & Plan Note (Signed)
Losartan, Norvasc 

## 2018-05-18 NOTE — Progress Notes (Signed)
Virtual Visit via Telephone Note  I connected with Ashley Larsen on 05/18/18 at  2:20 PM EDT by telephone and verified that I am speaking with the correct person using two identifiers.   I discussed the limitations, risks, security and privacy concerns of performing an evaluation and management service by telephone and the availability of in person appointments. I also discussed with the patient that there may be a patient responsible charge related to this service. The patient expressed understanding and agreed to proceed.   History of Present Illness:   f/u LBP/OA, HTN, PE f/u C/o allergies Observations/Objective:  NAD Assessment and Plan: See plan COVID 19 prevention discussed Xarelto samples  Follow Up Instructions:    I discussed the assessment and treatment plan with the patient. The patient was provided an opportunity to ask questions and all were answered. The patient agreed with the plan and demonstrated an understanding of the instructions.   The patient was advised to call back or seek an in-person evaluation if the symptoms worsen or if the condition fails to improve as anticipated.  I provided 20 minutes of non-face-to-face time during this encounter.   Walker Kehr, MD

## 2018-07-26 ENCOUNTER — Other Ambulatory Visit: Payer: Self-pay | Admitting: Internal Medicine

## 2018-07-26 NOTE — Telephone Encounter (Signed)
Copied from [place] [id number]. Topic: Quick Communication - Rx Refill/Question >> Jul 26, 2018  4:41 PM Keene Breath wrote: Medication: HYDROcodone-acetaminophen (NORCO/VICODIN) 5-325 MG tablet  Patient called to request a refill for the above medication  Preferred Pharmacy (with phone number or street name): [id number] [address], [place] [phone] (Phone) [phone] (Fax)

## 2018-07-28 MED ORDER — HYDROCODONE-ACETAMINOPHEN 5-325 MG PO TABS: 1.0000 | ORAL_TABLET | Freq: Three times a day (TID) | ORAL | 0 refills | Status: DC | PRN

## 2018-10-05 ENCOUNTER — Other Ambulatory Visit: Payer: Self-pay | Admitting: Internal Medicine

## 2018-10-06 NOTE — Telephone Encounter (Signed)
LOV w/ you: 05/18/18 Next OV: None

## 2018-10-24 ENCOUNTER — Other Ambulatory Visit: Payer: Self-pay | Admitting: Internal Medicine

## 2018-10-24 ENCOUNTER — Telehealth: Payer: Self-pay | Admitting: Internal Medicine

## 2018-10-24 NOTE — Telephone Encounter (Signed)
Medication Refill - Medication: HYDROcodone-acetaminophen (NORCO/VICODIN) 5-325 MG tablet  Has the patient contacted their pharmacy? Yes - states to contact PCP (Agent: If no, request that the patient contact the pharmacy for the refill.) (Agent: If yes, when and what did the pharmacy advise?)  Preferred Pharmacy (with phone number or street name):  [id number] [address], [place] [phone] (Phone) [phone] (Fax)   Agent: Please be advised that RX refills may take up to 3 business days. We ask that you follow-up with your pharmacy.

## 2018-10-27 MED ORDER — HYDROCODONE-ACETAMINOPHEN 5-325 MG PO TABS: 1.0000 | ORAL_TABLET | Freq: Three times a day (TID) | ORAL | 0 refills | Status: DC | PRN

## 2018-10-27 NOTE — Telephone Encounter (Signed)
The prescription is refilled.  Thanks

## 2018-12-21 ENCOUNTER — Other Ambulatory Visit (INDEPENDENT_AMBULATORY_CARE_PROVIDER_SITE_OTHER): Payer: Medicare Other

## 2018-12-21 ENCOUNTER — Other Ambulatory Visit: Payer: Self-pay

## 2018-12-21 DIAGNOSIS — E890 Postprocedural hypothyroidism: Secondary | ICD-10-CM

## 2018-12-21 LAB — T4, FREE: Free T4: 0.86 ng/dL (ref 0.60–1.60)

## 2018-12-21 LAB — TSH: TSH: 4.47 u[IU]/mL (ref 0.35–4.50)

## 2018-12-22 ENCOUNTER — Other Ambulatory Visit: Payer: Self-pay

## 2018-12-26 ENCOUNTER — Encounter: Payer: Self-pay | Admitting: Endocrinology

## 2018-12-26 ENCOUNTER — Ambulatory Visit (INDEPENDENT_AMBULATORY_CARE_PROVIDER_SITE_OTHER): Payer: Medicare Other | Admitting: Endocrinology

## 2018-12-26 VITALS — BP 122/72 | HR 95 | Ht 65.0 in | Wt 185.5 lb

## 2018-12-26 DIAGNOSIS — E890 Postprocedural hypothyroidism: Secondary | ICD-10-CM

## 2018-12-26 DIAGNOSIS — Z23 Encounter for immunization: Secondary | ICD-10-CM

## 2018-12-26 NOTE — Progress Notes (Signed)
Patient ID: Ashley Larsen, female   DOB: 01-Sep-1950, 68 y.o.   MRN: [id number]   Reason for Appointment:  Post ablative hypothyroidism, followup   History of Present Illness:   She had hyperthyroidism first diagnosed in 01/2013 and treated with radioactive iodine on 05/03/13 with 12 mCi She became hypothyroid subsequently and in 6/15 with a free T4 level of 0.57 she was started on 112 mcg Synthroid  Recent history: Her dose was reduced to 88 mcg in 07/2013 because of relatively high free T4 of 1.4 and low TSH   Previously since her TSH was high in 3/17 associated with fatigue for a couple of months she was changed to 88 g, 7-1/2 tablets weekly Subsequently was changed to 100 g daily.  She feels fairly good and does not complain of getting excessively tired She has chronic cold intolerance No significant weight change lately  She has been regular with her Synthroid dose daily before breakfast and takes it with water She has not taken any vitamin supplements   Wt Readings from Last 3 Encounters:  12/26/18 185 lb 8 oz (84.1 kg)  04/18/18 184 lb (83.5 kg)  12/23/17 184 lb (83.5 kg)    Her TSH which was normal at 1.51, is now 4.47 with slight decrease in free T4 compared to 2019   Labs:   Lab Results  Component Value Date   TSH 4.47 12/21/2018   TSH 1.51 12/20/2017   TSH 3.01 01/13/2017   FREET4 0.86 12/21/2018   FREET4 0.93 12/20/2017   FREET4 0.91 12/21/2016         Allergies as of 12/26/2018      Reactions   Metronidazole    REACTION: upset stomach   Oxycodone-acetaminophen Other (See Comments)   Pt tolerates Vicodin Caused Halucinations      Medication List       Accurate as of December 26, 2018 10:20 AM. If you have any questions, ask your nurse or doctor.        amLODipine 5 MG tablet Commonly known as: NORVASC Take 1 tablet (5 mg total) by mouth daily.   fexofenadine 180 MG tablet Commonly known as: ALLEGRA Take 1 tablet (180 mg total) by mouth daily as needed for allergies.   fluticasone 50 MCG/ACT nasal spray Commonly known as: FLONASE Place 2 sprays into both nostrils daily.   fluticasone furoate-vilanterol 100-25 MCG/INH Aepb Commonly known as: Breo Ellipta Inhale 1 puff into the lungs daily.   HYDROcodone-acetaminophen 5-325 MG tablet Commonly known as: NORCO/VICODIN Take 1 tablet by mouth every 8 (eight) hours as needed for severe pain.   levothyroxine 100 MCG tablet Commonly known as: SYNTHROID TAKE 1 TABLET BY MOUTH ONCE DAILY   losartan 100 MG tablet Commonly known as: COZAAR TAKE 1 TABLET(100 MG) BY MOUTH DAILY   Olopatadine HCl 0.2 % Soln INSTILL 1 DROP INTO OU QD   Promethazine-Codeine 6.25-10 MG/5ML Soln TAKE 5 ML BY MOUTH EVERY 4 HOURS AS NEEDED   ranitidine  150 MG tablet Commonly known as: ZANTAC Take 1 tablet (150 mg total) by mouth 2 (two) times daily.   rivaroxaban 20 MG Tabs tablet Commonly known as: Xarelto take 1 tablet by mouth once daily WITH SUPPER   triamcinolone ointment 0.5 % Commonly known as: KENALOG Apply 1 application topically 3 (three) times daily.           Past Medical History:  Diagnosis Date  . Allergic rhinitis   . Allergy   . Alopecia   . Arthritis   . Asthma    does not use an inhaler  . Bronchitis   . Cataract    bilateral cateracts  . Clotting disorder ([place])    blood clot in lungs 04/09/2015  . COPD (chronic obstructive pulmonary disease) ([place])   . Diverticulosis   . GERD (gastroesophageal reflux disease)   . Hypertension   . Hyperthyroidism   . IBS (irritable bowel syndrome)   . Insomnia   . Ovarian cyst   . Paresthesia   . Sickle cell anemia ([facility])    trait but not disease    Past Surgical History:  Procedure Laterality Date  . COLONOSCOPY    . KNEE ARTHROSCOPY     Left  . OOPHORECTOMY     Left  . ROTATOR CUFF REPAIR    . TUBAL LIGATION    .  UPPER GASTROINTESTINAL ENDOSCOPY    . wisdon teeth extraction      Family History  Problem Relation Age of Onset  . Cancer Brother        brother - stomach ca  . Stomach cancer Brother        dx age 49  . Heart disease Father   . Other Mother        sclerdermia  . Colon cancer Sister        dx in her 4's  . Hypertension Unknown   . Cancer Sister 79       colon  . Diabetes Sister   . Colon polyps Sister        and Brother  . Cancer Sister 29        colon  . Kidney disease Brother   . Diabetes Brother   . Diabetes Brother   . Diabetes Paternal Grandfather   . Esophageal cancer Neg Hx   . Rectal cancer Neg Hx   . Breast cancer Neg Hx     Social History:  reports that she has been smoking cigarettes. She has a 5.00 pack-year smoking history. She has never used smokeless tobacco. She reports that she does not drink alcohol or use drugs.  Allergies:  Allergies  Allergen Reactions  . Metronidazole     REACTION: upset stomach  . Oxycodone-Acetaminophen Other (See Comments)    Pt tolerates Vicodin  Caused Halucinations     Review of Systems:   HYPERTENSION: Has history of high blood pressure  treated with losartan 100 mg daily and amlodipine by her PCP Also previously had a low potassium level  BP Readings from Last 3 Encounters:  12/26/18 122/72  04/18/18 128/74  12/23/17 122/72   She has history of Graves' ophthalmopathy    Examination:   BP 122/72 (BP Location: Left Arm, Patient Position: Sitting, Cuff Size: Normal)   Pulse 95   Ht 5\' 5"  (1.651 m)   Wt 185 lb 8 oz (84.1 kg)   SpO2 97%   BMI 30.87 kg/m      Assessment/Plan:   Post-ablative hypothyroidism  She has post ablative hypothyroidism She has been on generic levothyroxine 100 mcg daily No recent symptoms of tiredness or sluggishness Although previously TSH has been consistently it is now high normal at 4.47 compared to 1.5  She appears asymptomatic so we will keep her on the same  regimen but have her come back in 6 months for follow-up  Influenza vaccine given  Hypertension: Normal, needs to follow-up with PCP History of mild hypercholesterolemia: Stable follow-up with PCP  [name] 12/26/2018, 10:20 AM   Note: This office note was prepared with Dragon voice recognition system technology. Any transcriptional errors that result from this process are unintentional.

## 2019-01-08 ENCOUNTER — Other Ambulatory Visit: Payer: Self-pay

## 2019-01-12 ENCOUNTER — Other Ambulatory Visit: Payer: Self-pay | Admitting: Internal Medicine

## 2019-01-12 NOTE — Telephone Encounter (Signed)
Pt is requesting a refill for HYDROcodone-acetaminophen (NORCO/VICODIN) 5-325 MG tablet    Pharmacy:  [facility] [phone] [address], [place] [phone] (Phone) [phone] (Fax)

## 2019-01-15 DIAGNOSIS — E0501 Thyrotoxicosis with diffuse goiter with thyrotoxic crisis or storm: Secondary | ICD-10-CM | POA: Diagnosis not present

## 2019-01-15 DIAGNOSIS — H2513 Age-related nuclear cataract, bilateral: Secondary | ICD-10-CM | POA: Diagnosis not present

## 2019-01-15 DIAGNOSIS — H40013 Open angle with borderline findings, low risk, bilateral: Secondary | ICD-10-CM | POA: Diagnosis not present

## 2019-01-15 DIAGNOSIS — E0500 Thyrotoxicosis with diffuse goiter without thyrotoxic crisis or storm: Secondary | ICD-10-CM | POA: Diagnosis not present

## 2019-01-15 NOTE — Telephone Encounter (Signed)
 Controlled Database Checked Last filled: 10/27/18 # 90 LOV w/you: 05/18/18  Next appt w/you: 01/16/19

## 2019-01-16 ENCOUNTER — Ambulatory Visit (INDEPENDENT_AMBULATORY_CARE_PROVIDER_SITE_OTHER): Payer: Medicare Other | Admitting: Internal Medicine

## 2019-01-16 ENCOUNTER — Encounter: Payer: Self-pay | Admitting: Internal Medicine

## 2019-01-16 ENCOUNTER — Other Ambulatory Visit: Payer: Self-pay

## 2019-01-16 VITALS — BP 122/80 | HR 82 | Temp 98.1°F | Ht 65.0 in | Wt 186.0 lb

## 2019-01-16 DIAGNOSIS — G8929 Other chronic pain: Secondary | ICD-10-CM | POA: Diagnosis not present

## 2019-01-16 DIAGNOSIS — M5440 Lumbago with sciatica, unspecified side: Secondary | ICD-10-CM | POA: Diagnosis not present

## 2019-01-16 DIAGNOSIS — Z23 Encounter for immunization: Secondary | ICD-10-CM | POA: Diagnosis not present

## 2019-01-16 DIAGNOSIS — K0889 Other specified disorders of teeth and supporting structures: Secondary | ICD-10-CM | POA: Insufficient documentation

## 2019-01-16 DIAGNOSIS — I10 Essential (primary) hypertension: Secondary | ICD-10-CM | POA: Diagnosis not present

## 2019-01-16 MED ORDER — HYDROCODONE-ACETAMINOPHEN 5-325 MG PO TABS: 1.0000 | ORAL_TABLET | Freq: Three times a day (TID) | ORAL | 0 refills | Status: DC | PRN

## 2019-01-16 NOTE — Assessment & Plan Note (Signed)
Norco prn  Potential benefits of a long term opioids use as well as potential risks (i.e. addiction risk, apnea etc) and complications (i.e. Somnolence, constipation and others) were explained to the patient and were aknowledged. 

## 2019-01-16 NOTE — Addendum Note (Signed)
Addended by: Karren Cobble on: 01/16/2019 04:43 PM   Modules accepted: Orders

## 2019-01-16 NOTE — Progress Notes (Signed)
Subjective:  Patient ID: Ashley Larsen, female    DOB: [birth year]  Age: 68 y.o. MRN: [id number]  CC: No chief complaint on file.   HPI Jonie Bauers presents for tooth abscess - on abx; root canal pending F/u HTN, COPD, LBP    Outpatient Medications Prior to Visit  Medication Sig Dispense Refill  . amLODipine (NORVASC) 5 MG tablet Take 1 tablet (5 mg total) by mouth daily. 90 tablet 3  . fexofenadine (ALLEGRA) 180 MG tablet Take 1 tablet (180 mg total) by mouth daily as needed for allergies. 90 tablet 3  . fluticasone (FLONASE) 50 MCG/ACT nasal spray Place 2 sprays into both nostrils daily. 16 g 6  . fluticasone furoate-vilanterol (BREO ELLIPTA) 100-25 MCG/INH AEPB Inhale 1 puff into the lungs daily. 1 each 11  . HYDROcodone-acetaminophen (NORCO/VICODIN) 5-325 MG tablet Take 1 tablet by mouth every 8 (eight) hours as needed for severe pain. 90 tablet 0  . levothyroxine (SYNTHROID, LEVOTHROID) 100 MCG tablet TAKE 1 TABLET BY MOUTH ONCE DAILY 90 tablet 3  . losartan (COZAAR) 100 MG tablet TAKE 1 TABLET(100 MG) BY MOUTH DAILY 90 tablet 1  . Olopatadine HCl 0.2 % SOLN INSTILL 1 DROP INTO OU QD  6  . Promethazine-Codeine 6.25-10 MG/5ML SOLN TAKE 5 ML BY MOUTH EVERY 4 HOURS AS NEEDED 240 mL 0  . rivaroxaban (XARELTO) 20 MG TABS tablet take 1 tablet by mouth once daily WITH SUPPER 90 tablet 3  . triamcinolone ointment (KENALOG) 0.5 % Apply 1 application topically 3 (three) times daily. 30 g 1  . ranitidine (ZANTAC) 150 MG tablet Take 1 tablet (150 mg total) by mouth 2 (two) times daily. 60 tablet 11   Facility-Administered Medications Prior to Visit  Medication Dose Route Frequency Provider Last Rate Last Dose  . 0.9 %  sodium chloride infusion  500 mL Intravenous Once Ladene Artist, MD        ROS: Review of Systems  Constitutional: Negative for activity change, appetite change, chills, fatigue and unexpected weight change.  HENT: Negative for congestion, mouth sores and sinus  pressure.   Eyes: Negative for visual disturbance.  Respiratory: Negative for cough, chest tightness and shortness of breath.   Gastrointestinal: Negative for abdominal pain and nausea.  Genitourinary: Negative for difficulty urinating, frequency and vaginal pain.  Musculoskeletal: Positive for back pain. Negative for gait problem.  Skin: Negative for pallor and rash.  Neurological: Negative for dizziness, tremors, weakness, numbness and headaches.  Psychiatric/Behavioral: Negative for confusion, sleep disturbance and suicidal ideas.    Objective:  BP 122/80 (BP Location: Left Arm, Patient Position: Sitting, Cuff Size: Normal)   Pulse 82   Temp 98.1 F (36.7 C) (Oral)   Ht 5\' 5"  (1.651 m)   Wt 186 lb (84.4 kg)   SpO2 96%   BMI 30.95 kg/m   BP Readings from Last 3 Encounters:  01/16/19 122/80  12/26/18 122/72  04/18/18 128/74    Wt Readings from Last 3 Encounters:  01/16/19 186 lb (84.4 kg)  12/26/18 185 lb 8 oz (84.1 kg)  04/18/18 184 lb (83.5 kg)    Physical Exam Constitutional:      General: She is not in acute distress.    Appearance: She is well-developed.  HENT:     Head: Normocephalic.     Right Ear: External ear normal.     Left Ear: External ear normal.     Nose: Nose normal.  Eyes:     General:  Right eye: No discharge.        Left eye: No discharge.     Conjunctiva/sclera: Conjunctivae normal.     Pupils: Pupils are equal, round, and reactive to light.  Neck:     Musculoskeletal: Normal range of motion and neck supple.     Thyroid: No thyromegaly.     Vascular: No JVD.     Trachea: No tracheal deviation.  Cardiovascular:     Rate and Rhythm: Normal rate and regular rhythm.     Heart sounds: Normal heart sounds.  Pulmonary:     Effort: No respiratory distress.     Breath sounds: No stridor. No wheezing.  Abdominal:     General: Bowel sounds are normal. There is no distension.     Palpations: Abdomen is soft. There is no mass.      Tenderness: There is no abdominal tenderness. There is no guarding or rebound.  Musculoskeletal:        General: No tenderness.  Lymphadenopathy:     Cervical: No cervical adenopathy.  Skin:    Findings: No erythema or rash.  Neurological:     Mental Status: She is oriented to person, place, and time.     Cranial Nerves: No cranial nerve deficit.     Motor: No abnormal muscle tone.     Coordination: Coordination normal.     Deep Tendon Reflexes: Reflexes normal.  Psychiatric:        Behavior: Behavior normal.        Thought Content: Thought content normal.        Judgment: Judgment normal.     Lab Results  Component Value Date   WBC 6.4 04/18/2018   HGB 11.7 (L) 04/18/2018   HCT 34.8 (L) 04/18/2018   PLT 283.0 04/18/2018   GLUCOSE 97 04/18/2018   CHOL 212 (H) 01/13/2017   TRIG 104.0 01/13/2017   HDL 41.30 01/13/2017   LDLDIRECT 185.7 01/19/2012   LDLCALC 150 (H) 01/13/2017   ALT 21 01/13/2017   AST 18 01/13/2017   NA 139 04/18/2018   K 3.5 04/18/2018   CL 105 04/18/2018   CREATININE 0.84 04/18/2018   BUN 15 04/18/2018   CO2 28 04/18/2018   TSH 4.47 12/21/2018   INR 1.8 (H) 10/07/2015   HGBA1C 6.0 08/26/2008    Dg Hip Unilat With Pelvis 2-3 Views Right  Result Date: 04/19/2018 CLINICAL DATA:  Right hip pain since a fall 1.5 weeks ago. EXAM: DG HIP (WITH OR WITHOUT PELVIS) 2-3V RIGHT COMPARISON:  None. FINDINGS: There is no fracture or dislocation. There is moderate osteoarthritis of the right hip with marginal osteophyte formation and medial joint space narrowing. There are similar but less prominent arthritic changes of the left hip. IMPRESSION: 1. No acute abnormality. 2. Moderate osteoarthritis of the right hip. Electronically Signed   By: Lorriane Shire M.D.   On: 04/19/2018 09:32    Assessment & Plan:   There are no diagnoses linked to this encounter.   No orders of the defined types were placed in this encounter.    Follow-up: No follow-ups on file.   Walker Kehr, MD

## 2019-01-16 NOTE — Assessment & Plan Note (Signed)
Losartan, Norvasc 

## 2019-01-16 NOTE — Assessment & Plan Note (Signed)
tooth abscess - on abx; root canal pending

## 2019-02-11 ENCOUNTER — Telehealth: Payer: Self-pay | Admitting: Internal Medicine

## 2019-02-14 ENCOUNTER — Other Ambulatory Visit: Payer: Self-pay

## 2019-02-14 ENCOUNTER — Telehealth: Payer: Self-pay

## 2019-02-14 MED ORDER — LEVOTHYROXINE SODIUM 100 MCG PO TABS: 100.0000 ug | ORAL_TABLET | Freq: Every day | ORAL | 1 refills | Status: DC

## 2019-02-14 NOTE — Telephone Encounter (Signed)
Pt notified to contact Dr. Jodelle Green office since he filled it last

## 2019-02-14 NOTE — Telephone Encounter (Signed)
Pt stated that she is completely out of the medication and would like the refill request to be approved.

## 2019-02-14 NOTE — Telephone Encounter (Signed)
Rx sent 

## 2019-02-14 NOTE — Telephone Encounter (Signed)
MEDICATION: levothyroxine (SYNTHROID, LEVOTHROID) 100 MCG tablet  PHARMACY:  [facility] [phone] - [place], [place] [address] [place] [address] 90 DAY SUPPLY : yes  IS PATIENT OUT OF MEDICATION: yes  IF NOT; HOW MUCH IS LEFT:   LAST APPOINTMENT DATE: @11 /11/2018  NEXT APPOINTMENT DATE:@5 /12/2019  DO WE HAVE YOUR PERMISSION TO LEAVE A DETAILED MESSAGE:  OTHER COMMENTS:    **Let patient know to contact pharmacy at the end of the day to make sure medication is ready. **  ** Please notify patient to allow 48-72 hours to process**  **Encourage patient to contact the pharmacy for refills or they can request refills through [facility]**

## 2019-03-07 ENCOUNTER — Other Ambulatory Visit: Payer: Self-pay | Admitting: Internal Medicine

## 2019-03-15 LAB — LIPID PANEL
Cholesterol: 253 — AB (ref 0–200)
Cholesterol: 253 — AB (ref 0–200)
HDL: 45 (ref 35–70)
LDL Cholesterol: 182
LDL Cholesterol: 183
LDl/HDL Ratio: 45
Triglycerides: 138 (ref 40–160)
Triglycerides: 138 (ref 40–160)

## 2019-03-15 LAB — HEMOGLOBIN A1C
Hemoglobin A1C: 5.9
Hemoglobin A1C: 9

## 2019-03-15 LAB — BASIC METABOLIC PANEL: Glucose: 94

## 2019-03-20 ENCOUNTER — Telehealth: Payer: Self-pay

## 2019-03-20 NOTE — Telephone Encounter (Signed)
Patient spoke with her PCP and she says she was told to contact Dr. Dwyane Dee to find out if it is safe for her to get he Covid vaccine-please advise

## 2019-03-20 NOTE — Telephone Encounter (Signed)
Pt was given the below information at the directions of Dr. Dwyane Dee.  Our providers are recommending the vaccine to all our patients, especially if you are a patient who has medical conditions that make you high-risk for serious COVID disease.    As of right now there is NO known contraindication to getting the COVID vaccine. This means that everyone should be able to get this vaccine, even if they have other medical conditions or are pregnant. Side effects are always possible, but as with all vaccines, we believe the benefits outweigh the risks.    It is important to remember that when people are having "reactions" to the vaccine, these reactions are almost always NORMAL immune system responses. It is ok to take Benadryl/diphenhydramine for these symptoms. If you are having any trouble breathing and/or feel like your airway is swelling, go to an urgent care or emergency department. Very few people are having immune responses so strong they need to go to the emergency department, but many people are not feeling well for 1 to 2 days after the vaccine is given.   The CDC states that if you have had an allergic reaction to polyethylene glycol (PEG) or polysorbate you should NOT get an mRNA COVID-19 vaccine.  Polysorbate is not an ingredient in either mRNA COVID-19 vaccine but is closely related to PEG, which is in the vaccines.   If you have any questions about adverse reactions related to COVID-19 vaccines, please visit [email or web address] or set up a virtual visit with your primary care provider.     For questions related to COVID-19 vaccine distribution or appointments, please visit [email or web address].

## 2019-03-22 ENCOUNTER — Other Ambulatory Visit: Payer: Self-pay

## 2019-03-22 MED ORDER — AMLODIPINE BESYLATE 5 MG PO TABS: 5.0000 mg | ORAL_TABLET | Freq: Every day | ORAL | 3 refills | Status: DC

## 2019-03-22 NOTE — Telephone Encounter (Signed)
**  Patient currently out of this medication. States that pharmacy reached out on Monday and have not heard back**  Medication Refill - Medication: amLODipine (NORVASC) 5 MG tablet   Has the patient contacted their pharmacy? Yes.   (Agent: If no, request that the patient contact the pharmacy for the refill.) (Agent: If yes, when and what did the pharmacy advise?)  Preferred Pharmacy (with phone number or street name): [id number] - [place], [place]: Please be advised that RX refills may take up to 3 business days. We ask that you follow-up with your pharmacy.

## 2019-03-22 NOTE — Telephone Encounter (Signed)
Sent in for patient today. 

## 2019-04-16 ENCOUNTER — Ambulatory Visit (INDEPENDENT_AMBULATORY_CARE_PROVIDER_SITE_OTHER): Payer: Medicare Other | Admitting: Internal Medicine

## 2019-04-16 ENCOUNTER — Encounter: Payer: Self-pay | Admitting: Internal Medicine

## 2019-04-16 ENCOUNTER — Other Ambulatory Visit: Payer: Self-pay

## 2019-04-16 DIAGNOSIS — I10 Essential (primary) hypertension: Secondary | ICD-10-CM | POA: Diagnosis not present

## 2019-04-16 DIAGNOSIS — M533 Sacrococcygeal disorders, not elsewhere classified: Secondary | ICD-10-CM | POA: Diagnosis not present

## 2019-04-16 DIAGNOSIS — E785 Hyperlipidemia, unspecified: Secondary | ICD-10-CM

## 2019-04-16 DIAGNOSIS — I2699 Other pulmonary embolism without acute cor pulmonale: Secondary | ICD-10-CM | POA: Diagnosis not present

## 2019-04-16 DIAGNOSIS — G8929 Other chronic pain: Secondary | ICD-10-CM

## 2019-04-16 MED ORDER — HYDROCODONE-ACETAMINOPHEN 5-325 MG PO TABS: 1.0000 | ORAL_TABLET | Freq: Three times a day (TID) | ORAL | 0 refills | Status: DC | PRN

## 2019-04-16 NOTE — Assessment & Plan Note (Signed)
Pt declined statins 

## 2019-04-16 NOTE — Progress Notes (Signed)
Subjective:  Patient ID: Ashley Larsen, female    DOB: Jan 13, 1951  Age: 69 y.o. MRN: [id number]  CC: No chief complaint on file.   HPI Dawnielle Tackman presents for COPD, cough, OA/LBP, anticoagulation f/u C/o R hip pain Outpatient Medications Prior to Visit  Medication Sig Dispense Refill  . amLODipine (NORVASC) 5 MG tablet Take 1 tablet (5 mg total) by mouth daily. 90 tablet 3  . fexofenadine (ALLEGRA) 180 MG tablet Take 1 tablet (180 mg total) by mouth daily as needed for allergies. 90 tablet 3  . fluticasone (FLONASE) 50 MCG/ACT nasal spray Place 2 sprays into both nostrils daily. 16 g 6  . fluticasone furoate-vilanterol (BREO ELLIPTA) 100-25 MCG/INH AEPB Inhale 1 puff into the lungs daily. 1 each 11  . HYDROcodone-acetaminophen (NORCO/VICODIN) 5-325 MG tablet Take 1 tablet by mouth every 8 (eight) hours as needed for severe pain. 90 tablet 0  . levothyroxine (SYNTHROID) 100 MCG tablet Take 1 tablet (100 mcg total) by mouth daily. 90 tablet 1  . losartan (COZAAR) 100 MG tablet TAKE 1 TABLET(100 MG) BY MOUTH DAILY 90 tablet 1  . Olopatadine HCl 0.2 % SOLN INSTILL 1 DROP INTO OU QD  6  . Promethazine-Codeine 6.25-10 MG/5ML SOLN TAKE 5 ML BY MOUTH EVERY 4 HOURS AS NEEDED 240 mL 0  . triamcinolone ointment (KENALOG) 0.5 % Apply 1 application topically 3 (three) times daily. 30 g 1  . XARELTO 20 MG TABS tablet TAKE 1 TABLET BY MOUTH EVERY DAY WITH SUPPER 90 tablet 3  . ranitidine (ZANTAC) 150 MG tablet Take 1 tablet (150 mg total) by mouth 2 (two) times daily. 60 tablet 11   Facility-Administered Medications Prior to Visit  Medication Dose Route Frequency Provider Last Rate Last Admin  . 0.9 %  sodium chloride infusion  500 mL Intravenous Once Ladene Artist, MD        ROS: Review of Systems  Constitutional: Negative for activity change, appetite change, chills, fatigue and unexpected weight change.  HENT: Negative for congestion, mouth sores and sinus pressure.   Eyes: Negative  for visual disturbance.  Respiratory: Positive for cough. Negative for chest tightness.   Cardiovascular: Negative for chest pain.  Gastrointestinal: Negative for abdominal pain and nausea.  Genitourinary: Negative for difficulty urinating, frequency and vaginal pain.  Musculoskeletal: Positive for back pain and gait problem.  Skin: Negative for pallor and rash.  Neurological: Negative for dizziness, tremors, weakness, numbness and headaches.  Psychiatric/Behavioral: Negative for confusion, sleep disturbance and suicidal ideas.    Objective:  BP 124/72 (BP Location: Left Arm, Patient Position: Sitting, Cuff Size: Large)   Pulse 73   Temp 98 F (36.7 C) (Oral)   Ht 5\' 5"  (1.651 m)   Wt 186 lb (84.4 kg)   SpO2 97%   BMI 30.95 kg/m   BP Readings from Last 3 Encounters:  04/16/19 124/72  01/16/19 122/80  12/26/18 122/72    Wt Readings from Last 3 Encounters:  04/16/19 186 lb (84.4 kg)  01/16/19 186 lb (84.4 kg)  12/26/18 185 lb 8 oz (84.1 kg)    Physical Exam Constitutional:      General: She is not in acute distress.    Appearance: She is well-developed.  HENT:     Head: Normocephalic.     Right Ear: External ear normal.     Left Ear: External ear normal.     Nose: Nose normal.  Eyes:     General:  Right eye: No discharge.        Left eye: No discharge.     Conjunctiva/sclera: Conjunctivae normal.     Pupils: Pupils are equal, round, and reactive to light.  Neck:     Thyroid: No thyromegaly.     Vascular: No JVD.     Trachea: No tracheal deviation.  Cardiovascular:     Rate and Rhythm: Normal rate and regular rhythm.     Heart sounds: Normal heart sounds.  Pulmonary:     Effort: No respiratory distress.     Breath sounds: No stridor. No wheezing.  Abdominal:     General: Bowel sounds are normal. There is no distension.     Palpations: Abdomen is soft. There is no mass.     Tenderness: There is no abdominal tenderness. There is no guarding or rebound.    Musculoskeletal:        General: Tenderness present.     Cervical back: Normal range of motion and neck supple.  Lymphadenopathy:     Cervical: No cervical adenopathy.  Skin:    Findings: No erythema or rash.  Neurological:     Mental Status: She is oriented to person, place, and time.     Cranial Nerves: No cranial nerve deficit.     Motor: No abnormal muscle tone.     Coordination: Coordination normal.     Deep Tendon Reflexes: Reflexes normal.  Psychiatric:        Behavior: Behavior normal.        Thought Content: Thought content normal.        Judgment: Judgment normal.     Lab Results  Component Value Date   WBC 6.4 04/18/2018   HGB 11.7 (L) 04/18/2018   HCT 34.8 (L) 04/18/2018   PLT 283.0 04/18/2018   GLUCOSE 97 04/18/2018   CHOL 212 (H) 01/13/2017   TRIG 104.0 01/13/2017   HDL 41.30 01/13/2017   LDLDIRECT 185.7 01/19/2012   LDLCALC 150 (H) 01/13/2017   ALT 21 01/13/2017   AST 18 01/13/2017   NA 139 04/18/2018   K 3.5 04/18/2018   CL 105 04/18/2018   CREATININE 0.84 04/18/2018   BUN 15 04/18/2018   CO2 28 04/18/2018   TSH 4.47 12/21/2018   INR 1.8 (H) 10/07/2015   HGBA1C 6.0 08/26/2008    DG HIP UNILAT WITH PELVIS 2-3 VIEWS RIGHT  Result Date: 04/19/2018 CLINICAL DATA:  Right hip pain since a fall 1.5 weeks ago. EXAM: DG HIP (WITH OR WITHOUT PELVIS) 2-3V RIGHT COMPARISON:  None. FINDINGS: There is no fracture or dislocation. There is moderate osteoarthritis of the right hip with marginal osteophyte formation and medial joint space narrowing. There are similar but less prominent arthritic changes of the left hip. IMPRESSION: 1. No acute abnormality. 2. Moderate osteoarthritis of the right hip. Electronically Signed   By: Lorriane Shire M.D.   On: 04/19/2018 09:32    Assessment & Plan:   There are no diagnoses linked to this encounter.   No orders of the defined types were placed in this encounter.    Follow-up: No follow-ups on file.  Walker Kehr,  MD

## 2019-04-16 NOTE — Assessment & Plan Note (Signed)
Ortho ref was offered  Norco prn

## 2019-04-16 NOTE — Assessment & Plan Note (Signed)
Xarelto No bleeding or bruising

## 2019-04-16 NOTE — Assessment & Plan Note (Signed)
Losartan and Norvasc

## 2019-04-24 ENCOUNTER — Other Ambulatory Visit: Payer: Self-pay | Admitting: Internal Medicine

## 2019-06-11 DIAGNOSIS — Z124 Encounter for screening for malignant neoplasm of cervix: Secondary | ICD-10-CM | POA: Diagnosis not present

## 2019-06-11 DIAGNOSIS — R1031 Right lower quadrant pain: Secondary | ICD-10-CM | POA: Diagnosis not present

## 2019-06-13 DIAGNOSIS — Z1231 Encounter for screening mammogram for malignant neoplasm of breast: Secondary | ICD-10-CM | POA: Diagnosis not present

## 2019-06-13 DIAGNOSIS — R1031 Right lower quadrant pain: Secondary | ICD-10-CM | POA: Diagnosis not present

## 2019-06-13 DIAGNOSIS — R102 Pelvic and perineal pain: Secondary | ICD-10-CM | POA: Diagnosis not present

## 2019-06-18 ENCOUNTER — Telehealth: Payer: Self-pay | Admitting: Internal Medicine

## 2019-06-18 NOTE — Telephone Encounter (Signed)
Mary with [facility] called this morning and was want to know when the patient needed to stop her Anticoagulant for factor 5 and when to resume after surgery. Requesting that it be witting and be faxed back.   Fax # [phone]

## 2019-06-19 NOTE — Telephone Encounter (Signed)
Please advise 

## 2019-06-20 NOTE — Telephone Encounter (Signed)
pls manage per your surgical protocol Thx

## 2019-06-21 NOTE — Telephone Encounter (Signed)
Letter maxed

## 2019-06-26 ENCOUNTER — Other Ambulatory Visit: Payer: Self-pay

## 2019-06-26 ENCOUNTER — Other Ambulatory Visit (INDEPENDENT_AMBULATORY_CARE_PROVIDER_SITE_OTHER): Payer: Medicare Other

## 2019-06-26 DIAGNOSIS — E890 Postprocedural hypothyroidism: Secondary | ICD-10-CM

## 2019-06-26 LAB — TSH: TSH: 2.04 u[IU]/mL (ref 0.35–4.50)

## 2019-06-26 LAB — T4, FREE: Free T4: 0.91 ng/dL (ref 0.60–1.60)

## 2019-06-28 ENCOUNTER — Encounter: Payer: Self-pay | Admitting: Endocrinology

## 2019-06-28 ENCOUNTER — Ambulatory Visit (INDEPENDENT_AMBULATORY_CARE_PROVIDER_SITE_OTHER): Payer: Medicare Other | Admitting: Endocrinology

## 2019-06-28 DIAGNOSIS — E890 Postprocedural hypothyroidism: Secondary | ICD-10-CM

## 2019-06-28 DIAGNOSIS — E0500 Thyrotoxicosis with diffuse goiter without thyrotoxic crisis or storm: Secondary | ICD-10-CM | POA: Diagnosis not present

## 2019-06-28 NOTE — Progress Notes (Signed)
Patient ID: Ashley Larsen, female   DOB: 1950/03/31, 69 y.o.   MRN: [id number]  Today's office visit was provided via telemedicine using a telephone call to the patient Patient has been explained the limitations of evaluation and management by telemedicine and the availability of in person appointments.  The patient understood the limitations and agreed to proceed. Patient also understood that the telehealth visit is billable. . Location of the patient: Home . Location of the provider: Office Only the patient and myself were participating in the encounter   Reason for Appointment:  Post ablative hypothyroidism, followup   History of Present Illness:   She had hyperthyroidism first diagnosed in 01/2013 and treated with radioactive iodine on 05/03/13 with 12 mCi She became hypothyroid subsequently and in 6/15 with a free T4 level of 0.57 she was started on 112 mcg Synthroid  Recent history: Her dose was reduced to 88 mcg in 07/2013 because of relatively high free T4 of 1.4 and low TSH   Previously since her TSH was high in 3/17 associated with fatigue for a couple of months she was changed to 88 g, 7-1/2 tablets weekly Subsequently was changed to 100 g daily.  She feels fairly good and does not complain of getting excessively tired She has chronic cold intolerance No significant weight change lately  She has been regular with her Synthroid dose daily before breakfast and takes it with water  She has not taken any vitamin supplements  Although her TSH was high normal previously since she was asymptomatic the dose was continued unchanged  Her TSH which was  4.47 is now back to 2.04   Wt Readings from Last 3 Encounters:  04/16/19 186 lb (84.4 kg)  01/16/19 186 lb (84.4 kg)  12/26/18 185 lb 8 oz (84.1 kg)    Labs:   Lab Results  Component Value Date   TSH 2.04 06/26/2019   TSH  4.47 12/21/2018   TSH 1.51 12/20/2017   FREET4 0.91 06/26/2019   FREET4 0.86 12/21/2018   FREET4 0.93 12/20/2017         Allergies as of 06/28/2019      Reactions   Metronidazole    REACTION: upset stomach   Oxycodone-acetaminophen Other (See Comments)   Pt tolerates Vicodin Caused Halucinations      Medication List       Accurate as of Jun 28, 2019 10:38 AM. If you have any questions, ask your nurse or doctor.        amLODipine 5 MG tablet Commonly known as: NORVASC Take 1 tablet (5 mg total) by mouth daily.   fexofenadine 180 MG tablet Commonly known as: ALLEGRA Take 1 tablet (180 mg total) by mouth daily as needed for allergies.   fluticasone 50 MCG/ACT nasal spray Commonly known as: FLONASE Place 2 sprays into both nostrils daily.   fluticasone furoate-vilanterol 100-25 MCG/INH Aepb Commonly known as: Breo Ellipta Inhale 1 puff into the lungs daily.   HYDROcodone-acetaminophen 5-325 MG  tablet Commonly known as: NORCO/VICODIN Take 1 tablet by mouth every 8 (eight) hours as needed for severe pain.   levothyroxine 100 MCG tablet Commonly known as: SYNTHROID Take 1 tablet (100 mcg total) by mouth daily.   losartan 100 MG tablet Commonly known as: COZAAR TAKE 1 TABLET(100 MG) BY MOUTH DAILY   Olopatadine HCl 0.2 % Soln INSTILL 1 DROP INTO OU QD   Promethazine-Codeine 6.25-10 MG/5ML Soln TAKE 5 ML BY MOUTH EVERY 4 HOURS AS NEEDED   ranitidine 150 MG tablet Commonly known as: ZANTAC Take 1 tablet (150 mg total) by mouth 2 (two) times daily.   triamcinolone ointment 0.5 % Commonly known as: KENALOG Apply 1 application topically 3 (three) times daily.   Xarelto 20 MG Tabs tablet Generic drug: rivaroxaban TAKE 1 TABLET BY MOUTH EVERY DAY WITH SUPPER           Past Medical History:  Diagnosis Date  . Allergic rhinitis   . Allergy   . Alopecia   . Arthritis   . Asthma    does not use an inhaler  . Bronchitis   . Cataract    bilateral  cateracts  . Clotting disorder ([place])    blood clot in lungs 04/09/2015  . COPD (chronic obstructive pulmonary disease) ([place])   . Diverticulosis   . GERD (gastroesophageal reflux disease)   . Hypertension   . Hyperthyroidism   . IBS (irritable bowel syndrome)   . Insomnia   . Ovarian cyst   . Paresthesia   . Sickle cell anemia ([facility])    trait but not disease    Past Surgical History:  Procedure Laterality Date  . COLONOSCOPY    . KNEE ARTHROSCOPY     Left  . OOPHORECTOMY     Left  . ROTATOR CUFF REPAIR    . TUBAL LIGATION    . UPPER GASTROINTESTINAL ENDOSCOPY    . [place] teeth extraction      Family History  Problem Relation Age of Onset  . Cancer Brother        brother - stomach ca  . Stomach cancer Brother        dx age 84  . Heart disease Father   . Other Mother        sclerdermia  . Colon cancer Sister        dx in her 25's  . Hypertension Unknown   . Cancer Sister 41       colon  . Diabetes Sister   . Colon polyps Sister        and Brother  . Cancer Sister 64        colon  . Kidney disease Brother   . Diabetes Brother   . Diabetes Brother   . Diabetes Paternal Grandfather   . Esophageal cancer Neg Hx   . Rectal cancer Neg Hx   . Breast cancer Neg Hx     Social History:  reports that she has been smoking cigarettes. She has a 5.00 pack-year smoking history. She has never used smokeless tobacco. She reports that she does not drink alcohol or use drugs.  Allergies:  Allergies  Allergen Reactions  . Metronidazole     REACTION: upset stomach  . Oxycodone-Acetaminophen Other (See Comments)    Pt tolerates Vicodin  Caused Halucinations     Review of Systems:   HYPERTENSION: Has history of high blood pressure  treated with losartan 100 mg daily and amlodipine by her PCP Recently seen by  PCP in March No recurrence of hyperkalemia but potassium was 3.5  BP Readings from Last 3 Encounters:  04/16/19 124/72  01/16/19 122/80  12/26/18 122/72    She has history of Graves' ophthalmopathy She says she has had more watering of her eyes but not itching or redness She was advised to get artificial tears but she has not done so recently    Examination:   There were no vitals taken for this visit.     Assessment/Plan:   Post-ablative hypothyroidism  She has post ablative hypothyroidism She has been on generic levothyroxine 100 mcg daily, fairly consistently now  She feels fairly good TSH was higher on the last visit but now back to normal without any change  She can go back to once a year follow-up since she has not required a change in her dosage  Ophthalmopathy: She still has symptoms suggestive of dry eyes and also tearing without reportedly any increase in prominence of the eyes Reminded her to use the artificial tears that had been previously recommended  Follow-up annually   [name] 06/28/2019, 10:38 AM   Note: This office note was prepared with Dragon voice recognition system technology. Any transcriptional errors that result from this process are unintentional.  Duration of telephone call =6 minutes

## 2019-07-12 DIAGNOSIS — Z0001 Encounter for general adult medical examination with abnormal findings: Secondary | ICD-10-CM | POA: Diagnosis not present

## 2019-07-12 DIAGNOSIS — Z01818 Encounter for other preprocedural examination: Secondary | ICD-10-CM | POA: Diagnosis not present

## 2019-07-17 ENCOUNTER — Ambulatory Visit (INDEPENDENT_AMBULATORY_CARE_PROVIDER_SITE_OTHER): Payer: Medicare Other | Admitting: Internal Medicine

## 2019-07-17 ENCOUNTER — Encounter: Payer: Self-pay | Admitting: Internal Medicine

## 2019-07-17 ENCOUNTER — Other Ambulatory Visit: Payer: Self-pay

## 2019-07-17 VITALS — BP 130/72 | HR 98 | Temp 98.4°F | Wt 181.6 lb

## 2019-07-17 DIAGNOSIS — I10 Essential (primary) hypertension: Secondary | ICD-10-CM | POA: Diagnosis not present

## 2019-07-17 DIAGNOSIS — E890 Postprocedural hypothyroidism: Secondary | ICD-10-CM | POA: Diagnosis not present

## 2019-07-17 DIAGNOSIS — E559 Vitamin D deficiency, unspecified: Secondary | ICD-10-CM

## 2019-07-17 DIAGNOSIS — R1031 Right lower quadrant pain: Secondary | ICD-10-CM | POA: Diagnosis not present

## 2019-07-17 DIAGNOSIS — M5440 Lumbago with sciatica, unspecified side: Secondary | ICD-10-CM

## 2019-07-17 DIAGNOSIS — I2699 Other pulmonary embolism without acute cor pulmonale: Secondary | ICD-10-CM

## 2019-07-17 DIAGNOSIS — G8929 Other chronic pain: Secondary | ICD-10-CM

## 2019-07-17 LAB — BASIC METABOLIC PANEL
BUN: 15 mg/dL (ref 6–23)
CO2: 27 mEq/L (ref 19–32)
Calcium: 9.7 mg/dL (ref 8.4–10.5)
Chloride: 105 mEq/L (ref 96–112)
Creatinine, Ser: 0.77 mg/dL (ref 0.40–1.20)
GFR: 90.49 mL/min (ref >60.00)
Glucose, Bld: 78 mg/dL (ref 70–99)
Potassium: 3.4 mEq/L — ABNORMAL LOW (ref 3.5–5.1)
Sodium: 141 mEq/L (ref 135–145)

## 2019-07-17 LAB — CBC WITH DIFFERENTIAL/PLATELET
Basophils Absolute: 0 10*3/uL (ref 0.0–0.1)
Basophils Relative: 0.2 % (ref 0.0–3.0)
Eosinophils Absolute: 0.1 10*3/uL (ref 0.0–0.7)
Eosinophils Relative: 2.2 % (ref 0.0–5.0)
HCT: 32.2 % — ABNORMAL LOW (ref 36.0–46.0)
Hemoglobin: 10.6 g/dL — ABNORMAL LOW (ref 12.0–15.0)
Lymphocytes Relative: 32.8 % (ref 12.0–46.0)
Lymphs Abs: 1.9 10*3/uL (ref 0.7–4.0)
MCHC: 32.9 g/dL (ref 30.0–36.0)
MCV: 77 fl — ABNORMAL LOW (ref 78.0–100.0)
Monocytes Absolute: 0.6 10*3/uL (ref 0.1–1.0)
Monocytes Relative: 9.8 % (ref 3.0–12.0)
Neutro Abs: 3.1 10*3/uL (ref 1.4–7.7)
Neutrophils Relative %: 55 % (ref 43.0–77.0)
Platelets: 280 10*3/uL (ref 150.0–400.0)
RBC: 4.18 Mil/uL (ref 3.87–5.11)
RDW: 17.2 % — ABNORMAL HIGH (ref 11.5–15.5)
WBC: 5.6 10*3/uL (ref 4.0–10.5)

## 2019-07-17 LAB — VITAMIN D 25 HYDROXY (VIT D DEFICIENCY, FRACTURES): VITD: 8.39 ng/mL — ABNORMAL LOW (ref 30.00–100.00)

## 2019-07-17 LAB — HEPATIC FUNCTION PANEL
ALT: 20 U/L (ref 0–35)
AST: 17 U/L (ref 0–37)
Albumin: 4.4 g/dL (ref 3.5–5.2)
Alkaline Phosphatase: 76 U/L (ref 39–117)
Bilirubin, Direct: 0.1 mg/dL (ref 0.0–0.3)
Total Bilirubin: 0.3 mg/dL (ref 0.2–1.2)
Total Protein: 8.2 g/dL (ref 6.0–8.3)

## 2019-07-17 MED ORDER — HYDROCODONE-ACETAMINOPHEN 5-325 MG PO TABS: 1.0000 | ORAL_TABLET | Freq: Three times a day (TID) | ORAL | 0 refills | Status: DC | PRN

## 2019-07-17 MED ORDER — VITAMIN D3 50 MCG (2000 UT) PO CAPS: 2000.0000 [IU] | ORAL_CAPSULE | Freq: Every day | ORAL | 3 refills | Status: AC

## 2019-07-17 NOTE — Progress Notes (Signed)
Subjective:  Patient ID: Ashley Larsen, female    DOB: 03/25/1950  Age: 69 y.o. MRN: [id number]  CC: Follow-up (3 month follow-up) and Medication Refill (Requesting Hydrocodone to be refill)   HPI Ashley Larsen presents for RLQ pain - seeing GYN Hysterectomy is planned soon C/o LBP,cough - better C/o allergies  Outpatient Medications Prior to Visit  Medication Sig Dispense Refill  . amLODipine (NORVASC) 5 MG tablet Take 1 tablet (5 mg total) by mouth daily. 90 tablet 3  . Artificial Tear Solution (GENTEAL TEARS) 0.1-0.2-0.3 % SOLN Place 1 drop into both eyes daily as needed (Dry eye).    . fexofenadine (ALLEGRA) 180 MG tablet Take 1 tablet (180 mg total) by mouth daily as needed for allergies. (Patient taking differently: Take 180 mg by mouth daily as needed for allergies. 24 hours) 90 tablet 3  . fluticasone (FLONASE) 50 MCG/ACT nasal spray Place 2 sprays into both nostrils daily. (Patient taking differently: Place 2 sprays into both nostrils daily as needed for allergies or rhinitis. ) 16 g 6  . HYDROcodone-acetaminophen (NORCO/VICODIN) 5-325 MG tablet Take 1 tablet by mouth every 8 (eight) hours as needed for severe pain. 90 tablet 0  . levothyroxine (SYNTHROID) 100 MCG tablet Take 1 tablet (100 mcg total) by mouth daily. 90 tablet 1  . losartan (COZAAR) 100 MG tablet TAKE 1 TABLET(100 MG) BY MOUTH DAILY (Patient taking differently: Take 100 mg by mouth daily. ) 90 tablet 1  . XARELTO 20 MG TABS tablet TAKE 1 TABLET BY MOUTH EVERY DAY WITH SUPPER (Patient taking differently: Take 20 mg by mouth daily with supper. ) 90 tablet 3  . 0.9 %  sodium chloride infusion      No facility-administered medications prior to visit.    ROS: Review of Systems  Constitutional: Negative for activity change, appetite change, chills, fatigue and unexpected weight change.  HENT: Negative for congestion, mouth sores and sinus pressure.   Eyes: Negative for visual disturbance.  Respiratory: Negative  for cough and chest tightness.   Gastrointestinal: Positive for abdominal pain. Negative for nausea.  Genitourinary: Negative for difficulty urinating, frequency and vaginal pain.  Musculoskeletal: Positive for arthralgias and back pain. Negative for gait problem.  Skin: Negative for pallor and rash.  Neurological: Negative for dizziness, tremors, weakness, numbness and headaches.  Psychiatric/Behavioral: Positive for sleep disturbance. Negative for confusion and suicidal ideas. The patient is not nervous/anxious.     Objective:  BP 130/72 (BP Location: Left Arm)   Pulse 98   Temp 98.4 F (36.9 C) (Oral)   Wt 181 lb 9.6 oz (82.4 kg)   SpO2 97%   BMI 30.22 kg/m   BP Readings from Last 3 Encounters:  07/17/19 130/72  04/16/19 124/72  01/16/19 122/80    Wt Readings from Last 3 Encounters:  07/17/19 181 lb 9.6 oz (82.4 kg)  04/16/19 186 lb (84.4 kg)  01/16/19 186 lb (84.4 kg)    Physical Exam Constitutional:      General: She is not in acute distress.    Appearance: She is well-developed.  HENT:     Head: Normocephalic.     Right Ear: External ear normal.     Left Ear: External ear normal.     Nose: Nose normal.  Eyes:     General:        Right eye: No discharge.        Left eye: No discharge.     Conjunctiva/sclera: Conjunctivae normal.  Pupils: Pupils are equal, round, and reactive to light.  Neck:     Thyroid: No thyromegaly.     Vascular: No JVD.     Trachea: No tracheal deviation.  Cardiovascular:     Rate and Rhythm: Normal rate and regular rhythm.     Heart sounds: Normal heart sounds.  Pulmonary:     Effort: No respiratory distress.     Breath sounds: No stridor. No wheezing.  Abdominal:     General: Bowel sounds are normal. There is no distension.     Palpations: Abdomen is soft. There is no mass.     Tenderness: There is abdominal tenderness. There is no guarding or rebound.  Musculoskeletal:        General: Tenderness present.     Cervical back:  Normal range of motion and neck supple.  Lymphadenopathy:     Cervical: No cervical adenopathy.  Skin:    Findings: No erythema or rash.  Neurological:     Mental Status: She is oriented to person, place, and time.     Cranial Nerves: No cranial nerve deficit.     Motor: No abnormal muscle tone.     Coordination: Coordination normal.     Gait: Gait abnormal.     Deep Tendon Reflexes: Reflexes normal.  Psychiatric:        Behavior: Behavior normal.        Thought Content: Thought content normal.        Judgment: Judgment normal.   LS spine - pain w/ROM RLQ is sensitive to palpation  Lab Results  Component Value Date   WBC 6.4 04/18/2018   HGB 11.7 (L) 04/18/2018   HCT 34.8 (L) 04/18/2018   PLT 283.0 04/18/2018   GLUCOSE 97 04/18/2018   CHOL 212 (H) 01/13/2017   TRIG 104.0 01/13/2017   HDL 41.30 01/13/2017   LDLDIRECT 185.7 01/19/2012   LDLCALC 150 (H) 01/13/2017   ALT 21 01/13/2017   AST 18 01/13/2017   NA 139 04/18/2018   K 3.5 04/18/2018   CL 105 04/18/2018   CREATININE 0.84 04/18/2018   BUN 15 04/18/2018   CO2 28 04/18/2018   TSH 2.04 06/26/2019   INR 1.8 (H) 10/07/2015   HGBA1C 6.0 08/26/2008    DG HIP UNILAT WITH PELVIS 2-3 VIEWS RIGHT  Result Date: 04/19/2018 CLINICAL DATA:  Right hip pain since a fall 1.5 weeks ago. EXAM: DG HIP (WITH OR WITHOUT PELVIS) 2-3V RIGHT COMPARISON:  None. FINDINGS: There is no fracture or dislocation. There is moderate osteoarthritis of the right hip with marginal osteophyte formation and medial joint space narrowing. There are similar but less prominent arthritic changes of the left hip. IMPRESSION: 1. No acute abnormality. 2. Moderate osteoarthritis of the right hip. Electronically Signed   By: Lorriane Shire M.D.   On: 04/19/2018 09:32    Assessment & Plan:    Walker Kehr, MD

## 2019-07-17 NOTE — Assessment & Plan Note (Signed)
Hysterectomy is planned

## 2019-07-17 NOTE — Assessment & Plan Note (Signed)
Norco prn  Potential benefits of a long term opioids use as well as potential risks (i.e. addiction risk, apnea etc) and complications (i.e. Somnolence, constipation and others) were explained to the patient and were aknowledged. R/o Vit d deficiency

## 2019-07-17 NOTE — Assessment & Plan Note (Signed)
Losartan, Norvasc 

## 2019-07-17 NOTE — Assessment & Plan Note (Signed)
On Xarelto 

## 2019-07-18 ENCOUNTER — Other Ambulatory Visit: Payer: Self-pay | Admitting: Internal Medicine

## 2019-07-18 MED ORDER — VITAMIN D3 1.25 MG (50000 UT) PO CAPS: 1.0000 | ORAL_CAPSULE | ORAL | 0 refills | Status: DC

## 2019-07-18 MED ORDER — POTASSIUM CHLORIDE ER 10 MEQ PO TBCR: 10.0000 meq | EXTENDED_RELEASE_TABLET | Freq: Every day | ORAL | 0 refills | Status: DC

## 2019-07-21 ENCOUNTER — Other Ambulatory Visit (HOSPITAL_COMMUNITY)
Admission: RE | Admit: 2019-07-21 | Discharge: 2019-07-21 | Disposition: A | Discharge status: Home / Self Care | Payer: Medicare Other | Source: Ambulatory Visit | Attending: Obstetrics and Gynecology | Admitting: Obstetrics and Gynecology

## 2019-07-21 DIAGNOSIS — Z01812 Encounter for preprocedural laboratory examination: Secondary | ICD-10-CM | POA: Insufficient documentation

## 2019-07-21 DIAGNOSIS — Z20822 Contact with and (suspected) exposure to covid-19: Secondary | ICD-10-CM | POA: Insufficient documentation

## 2019-07-21 LAB — SARS CORONAVIRUS 2 (TAT 6-24 HRS): SARS Coronavirus 2: NEGATIVE

## 2019-07-23 ENCOUNTER — Other Ambulatory Visit: Payer: Self-pay

## 2019-07-23 ENCOUNTER — Encounter (HOSPITAL_COMMUNITY): Payer: Self-pay | Admitting: Obstetrics and Gynecology

## 2019-07-23 NOTE — Anesthesia Preprocedure Evaluation (Addendum)
Anesthesia Evaluation  Patient identified by MRN, date of birth, ID band Patient awake    Reviewed: Allergy & Precautions, NPO status , Patient's Chart, lab work & pertinent test results  Airway Mallampati: I       Dental no notable dental hx. (+) Teeth Intact   Pulmonary Current Smoker,    Pulmonary exam normal breath sounds clear to auscultation       Cardiovascular hypertension, Pt. on medications Normal cardiovascular exam Rhythm:Regular Rate:Normal     Neuro/Psych negative psych ROS   GI/Hepatic Neg liver ROS, GERD  Medicated,  Endo/Other  Hypothyroidism   Renal/GU negative Renal ROS  negative genitourinary   Musculoskeletal   Abdominal (+) + obese,   Peds  Hematology  (+) anemia ,   Anesthesia Other Findings   Reproductive/Obstetrics                            Anesthesia Physical Anesthesia Plan  ASA: II  Anesthesia Plan: Spinal   Post-op Pain Management:    Induction:   PONV Risk Score and Plan: Ondansetron, Dexamethasone and Midazolam  Airway Management Planned: Natural Airway, Simple Face Mask and Mask  Additional Equipment: None  Intra-op Plan:   Post-operative Plan: Extubation in OR  Informed Consent: I have reviewed the patients History and Physical, chart, labs and discussed the procedure including the risks, benefits and alternatives for the proposed anesthesia with the patient or authorized representative who has indicated his/her understanding and acceptance.       Plan Discussed with: CRNA  Anesthesia Plan Comments:        Anesthesia Quick Evaluation

## 2019-07-23 NOTE — Progress Notes (Signed)
Patient denies chest pain, shortness of breath, or cardiology visit. Educated on Environmental manager. States she has been in quarantine since COVID test. Last dose of Xarelto 07/21/2019

## 2019-07-24 ENCOUNTER — Inpatient Hospital Stay (HOSPITAL_COMMUNITY)
Admission: RE | Admit: 2019-07-24 | Discharge: 2019-07-26 | DRG: 742 | Disposition: A | Discharge status: Home / Self Care | Payer: Medicare Other | Attending: Obstetrics and Gynecology | Admitting: Obstetrics and Gynecology

## 2019-07-24 ENCOUNTER — Encounter (HOSPITAL_COMMUNITY)
Admission: RE | Disposition: A | Discharge status: Home / Self Care | Payer: Self-pay | Source: Home / Self Care | Attending: Obstetrics and Gynecology

## 2019-07-24 ENCOUNTER — Encounter (HOSPITAL_COMMUNITY): Payer: Self-pay | Admitting: Obstetrics and Gynecology

## 2019-07-24 ENCOUNTER — Inpatient Hospital Stay (HOSPITAL_COMMUNITY): Payer: Medicare Other | Admitting: Anesthesiology

## 2019-07-24 DIAGNOSIS — G8929 Other chronic pain: Secondary | ICD-10-CM | POA: Diagnosis present

## 2019-07-24 DIAGNOSIS — D6851 Activated protein C resistance: Secondary | ICD-10-CM | POA: Diagnosis present

## 2019-07-24 DIAGNOSIS — I10 Essential (primary) hypertension: Secondary | ICD-10-CM | POA: Diagnosis present

## 2019-07-24 DIAGNOSIS — Z86711 Personal history of pulmonary embolism: Secondary | ICD-10-CM | POA: Diagnosis not present

## 2019-07-24 DIAGNOSIS — J449 Chronic obstructive pulmonary disease, unspecified: Secondary | ICD-10-CM | POA: Diagnosis not present

## 2019-07-24 DIAGNOSIS — Z8249 Family history of ischemic heart disease and other diseases of the circulatory system: Secondary | ICD-10-CM

## 2019-07-24 DIAGNOSIS — N888 Other specified noninflammatory disorders of cervix uteri: Secondary | ICD-10-CM | POA: Diagnosis present

## 2019-07-24 DIAGNOSIS — M1990 Unspecified osteoarthritis, unspecified site: Secondary | ICD-10-CM | POA: Diagnosis present

## 2019-07-24 DIAGNOSIS — Z90721 Acquired absence of ovaries, unilateral: Secondary | ICD-10-CM | POA: Diagnosis not present

## 2019-07-24 DIAGNOSIS — E039 Hypothyroidism, unspecified: Secondary | ICD-10-CM | POA: Diagnosis not present

## 2019-07-24 DIAGNOSIS — Z6830 Body mass index (BMI) 30.0-30.9, adult: Secondary | ICD-10-CM

## 2019-07-24 DIAGNOSIS — R102 Pelvic and perineal pain: Secondary | ICD-10-CM | POA: Diagnosis not present

## 2019-07-24 DIAGNOSIS — N736 Female pelvic peritoneal adhesions (postinfective): Principal | ICD-10-CM | POA: Diagnosis present

## 2019-07-24 DIAGNOSIS — Z7901 Long term (current) use of anticoagulants: Secondary | ICD-10-CM | POA: Diagnosis not present

## 2019-07-24 DIAGNOSIS — Z20822 Contact with and (suspected) exposure to covid-19: Secondary | ICD-10-CM | POA: Diagnosis present

## 2019-07-24 DIAGNOSIS — D279 Benign neoplasm of unspecified ovary: Secondary | ICD-10-CM | POA: Diagnosis not present

## 2019-07-24 DIAGNOSIS — Z885 Allergy status to narcotic agent status: Secondary | ICD-10-CM

## 2019-07-24 DIAGNOSIS — E669 Obesity, unspecified: Secondary | ICD-10-CM | POA: Diagnosis present

## 2019-07-24 HISTORY — DX: Anemia, unspecified: D64.9

## 2019-07-24 HISTORY — DX: Sickle-cell trait: D57.3

## 2019-07-24 HISTORY — PX: ABDOMINAL HYSTERECTOMY: SHX81

## 2019-07-24 HISTORY — DX: Other pulmonary embolism without acute cor pulmonale: I26.99

## 2019-07-24 HISTORY — DX: Restless legs syndrome: G25.81

## 2019-07-24 LAB — TYPE AND SCREEN
ABO/RH(D): A POS
Antibody Screen: NEGATIVE

## 2019-07-24 LAB — PROTIME-INR
INR: 1 (ref 0.8–1.2)
Prothrombin Time: 12.9 s (ref 11.4–15.2)

## 2019-07-24 LAB — ABO/RH: ABO/RH(D): A POS

## 2019-07-24 SURGERY — HYSTERECTOMY, ABDOMINAL
Anesthesia: General | Site: Abdomen | Laterality: Right

## 2019-07-24 MED ORDER — PHENYLEPHRINE 40 MCG/ML (10ML) SYRINGE FOR IV PUSH (FOR BLOOD PRESSURE SUPPORT)
PREFILLED_SYRINGE | INTRAVENOUS | Status: AC
  Filled 2019-07-24: qty 10

## 2019-07-24 MED ORDER — ORAL CARE MOUTH RINSE: 15.0000 mL | Freq: Once | OROMUCOSAL | Status: AC

## 2019-07-24 MED ORDER — NALOXONE HCL 4 MG/10ML IJ SOLN
1.0000 ug/kg/h | INTRAVENOUS | Status: DC | PRN
  Filled 2019-07-24: qty 5

## 2019-07-24 MED ORDER — LIDOCAINE 2% (20 MG/ML) 5 ML SYRINGE
INTRAMUSCULAR | Status: DC | PRN
  Administered 2019-07-24: 40 mg via INTRAVENOUS

## 2019-07-24 MED ORDER — CHLORHEXIDINE GLUCONATE 0.12 % MT SOLN
15.0000 mL | Freq: Once | OROMUCOSAL | Status: AC
  Administered 2019-07-24: 15 mL via OROMUCOSAL
  Filled 2019-07-24: qty 15

## 2019-07-24 MED ORDER — KETOROLAC TROMETHAMINE 30 MG/ML IJ SOLN: 30.0000 mg | Freq: Four times a day (QID) | INTRAMUSCULAR | Status: DC | PRN

## 2019-07-24 MED ORDER — DEXTROSE IN LACTATED RINGERS 5 % IV SOLN: INTRAVENOUS | Status: DC

## 2019-07-24 MED ORDER — ROCURONIUM BROMIDE 10 MG/ML (PF) SYRINGE
PREFILLED_SYRINGE | INTRAVENOUS | Status: AC
  Filled 2019-07-24: qty 10

## 2019-07-24 MED ORDER — ONDANSETRON HCL 4 MG PO TABS: 4.0000 mg | ORAL_TABLET | Freq: Four times a day (QID) | ORAL | Status: DC | PRN

## 2019-07-24 MED ORDER — NALOXONE HCL 0.4 MG/ML IJ SOLN: 0.4000 mg | INTRAMUSCULAR | Status: DC | PRN

## 2019-07-24 MED ORDER — PROPOFOL 10 MG/ML IV BOLUS
INTRAVENOUS | Status: AC
  Filled 2019-07-24: qty 20

## 2019-07-24 MED ORDER — SCOPOLAMINE 1 MG/3DAYS TD PT72
1.0000 | MEDICATED_PATCH | Freq: Once | TRANSDERMAL | Status: DC
  Administered 2019-07-24: 1.5 mg via TRANSDERMAL
  Filled 2019-07-24: qty 1

## 2019-07-24 MED ORDER — DIPHENHYDRAMINE HCL 12.5 MG/5ML PO ELIX: 12.5000 mg | ORAL_SOLUTION | Freq: Four times a day (QID) | ORAL | Status: DC | PRN

## 2019-07-24 MED ORDER — MENTHOL 3 MG MT LOZG: 1.0000 | LOZENGE | OROMUCOSAL | Status: DC | PRN

## 2019-07-24 MED ORDER — DOCUSATE SODIUM 100 MG PO CAPS
100.0000 mg | ORAL_CAPSULE | Freq: Two times a day (BID) | ORAL | Status: DC
  Administered 2019-07-24 – 2019-07-25 (×4): 100 mg via ORAL
  Filled 2019-07-24 (×4): qty 1

## 2019-07-24 MED ORDER — ONDANSETRON HCL 4 MG/2ML IJ SOLN: 4.0000 mg | Freq: Four times a day (QID) | INTRAMUSCULAR | Status: DC | PRN

## 2019-07-24 MED ORDER — LACTATED RINGERS IV SOLN: INTRAVENOUS | Status: DC | PRN

## 2019-07-24 MED ORDER — ONDANSETRON HCL 4 MG/2ML IJ SOLN
INTRAMUSCULAR | Status: AC
  Filled 2019-07-24: qty 2

## 2019-07-24 MED ORDER — SODIUM CHLORIDE (PF) 0.9 % IJ SOLN
INTRAMUSCULAR | Status: AC
  Filled 2019-07-24: qty 50

## 2019-07-24 MED ORDER — ONDANSETRON HCL 4 MG/2ML IJ SOLN
4.0000 mg | Freq: Four times a day (QID) | INTRAMUSCULAR | Status: DC | PRN
  Administered 2019-07-24: 4 mg via INTRAVENOUS
  Filled 2019-07-24: qty 2

## 2019-07-24 MED ORDER — DIPHENHYDRAMINE HCL 50 MG/ML IJ SOLN: 12.5000 mg | INTRAMUSCULAR | Status: DC | PRN

## 2019-07-24 MED ORDER — AMLODIPINE BESYLATE 5 MG PO TABS
5.0000 mg | ORAL_TABLET | Freq: Every day | ORAL | Status: DC
  Administered 2019-07-25: 5 mg via ORAL
  Filled 2019-07-24 (×2): qty 1

## 2019-07-24 MED ORDER — SUCCINYLCHOLINE CHLORIDE 200 MG/10ML IV SOSY
PREFILLED_SYRINGE | INTRAVENOUS | Status: AC
  Filled 2019-07-24: qty 10

## 2019-07-24 MED ORDER — CHLORHEXIDINE GLUCONATE CLOTH 2 % EX PADS
6.0000 | MEDICATED_PAD | Freq: Every day | CUTANEOUS | Status: DC
  Administered 2019-07-24: 6 via TOPICAL

## 2019-07-24 MED ORDER — LIDOCAINE 2% (20 MG/ML) 5 ML SYRINGE
INTRAMUSCULAR | Status: AC
  Filled 2019-07-24: qty 5

## 2019-07-24 MED ORDER — SODIUM CHLORIDE 0.9% FLUSH: 9.0000 mL | INTRAVENOUS | Status: DC | PRN

## 2019-07-24 MED ORDER — SODIUM CHLORIDE (PF) 0.9 % IJ SOLN
INTRAMUSCULAR | Status: DC | PRN
  Administered 2019-07-24: 30 mL

## 2019-07-24 MED ORDER — PROMETHAZINE HCL 25 MG/ML IJ SOLN
12.5000 mg | Freq: Four times a day (QID) | INTRAMUSCULAR | Status: DC | PRN
  Administered 2019-07-24: 12.5 mg via INTRAVENOUS
  Filled 2019-07-24: qty 1

## 2019-07-24 MED ORDER — POLYETHYLENE GLYCOL 3350 17 G PO PACK: 17.0000 g | PACK | Freq: Every day | ORAL | Status: DC | PRN

## 2019-07-24 MED ORDER — SODIUM CHLORIDE 0.9% FLUSH: 3.0000 mL | INTRAVENOUS | Status: DC | PRN

## 2019-07-24 MED ORDER — BUPIVACAINE HCL 0.25 % IJ SOLN
INTRAMUSCULAR | Status: DC | PRN
  Administered 2019-07-24: 30 mL

## 2019-07-24 MED ORDER — MORPHINE SULFATE (PF) 0.5 MG/ML IJ SOLN
INTRAMUSCULAR | Status: DC | PRN
  Administered 2019-07-24: .2 mg via INTRATHECAL

## 2019-07-24 MED ORDER — BUPIVACAINE IN DEXTROSE 0.75-8.25 % IT SOLN
INTRATHECAL | Status: DC | PRN
  Administered 2019-07-24: 2 mL via INTRATHECAL

## 2019-07-24 MED ORDER — SODIUM CHLORIDE 0.9 % IV SOLN
2.0000 g | INTRAVENOUS | Status: AC
  Administered 2019-07-24: 2 g via INTRAVENOUS
  Filled 2019-07-24: qty 2

## 2019-07-24 MED ORDER — NALBUPHINE HCL 10 MG/ML IJ SOLN
5.0000 mg | Freq: Once | INTRAMUSCULAR | Status: DC | PRN
  Filled 2019-07-24: qty 0.5

## 2019-07-24 MED ORDER — MORPHINE SULFATE (PF) 0.5 MG/ML IJ SOLN
INTRAMUSCULAR | Status: AC
  Filled 2019-07-24: qty 10

## 2019-07-24 MED ORDER — ONDANSETRON HCL 4 MG/2ML IJ SOLN: 4.0000 mg | Freq: Three times a day (TID) | INTRAMUSCULAR | Status: DC | PRN

## 2019-07-24 MED ORDER — MORPHINE SULFATE 2 MG/ML IV SOLN
INTRAVENOUS | Status: DC
  Administered 2019-07-24: 1 mg via INTRAVENOUS
  Filled 2019-07-24 (×2): qty 25

## 2019-07-24 MED ORDER — POTASSIUM CHLORIDE CRYS ER 10 MEQ PO TBCR
10.0000 meq | EXTENDED_RELEASE_TABLET | Freq: Every day | ORAL | Status: DC
  Administered 2019-07-24 – 2019-07-25 (×2): 10 meq via ORAL
  Filled 2019-07-24 (×2): qty 1

## 2019-07-24 MED ORDER — ONDANSETRON HCL 4 MG/2ML IJ SOLN
INTRAMUSCULAR | Status: DC | PRN
  Administered 2019-07-24: 4 mg via INTRAVENOUS

## 2019-07-24 MED ORDER — GABAPENTIN 100 MG PO CAPS
200.0000 mg | ORAL_CAPSULE | Freq: Two times a day (BID) | ORAL | Status: DC
  Administered 2019-07-24 (×2): 200 mg via ORAL
  Filled 2019-07-24 (×2): qty 2

## 2019-07-24 MED ORDER — HYDROMORPHONE HCL 1 MG/ML IJ SOLN: 0.2500 mg | INTRAMUSCULAR | Status: DC | PRN

## 2019-07-24 MED ORDER — PROPOFOL 10 MG/ML IV BOLUS
INTRAVENOUS | Status: DC | PRN
  Administered 2019-07-24: 25 ug/kg/min via INTRAVENOUS

## 2019-07-24 MED ORDER — MEPERIDINE HCL 25 MG/ML IJ SOLN: 6.2500 mg | INTRAMUSCULAR | Status: DC | PRN

## 2019-07-24 MED ORDER — FENTANYL CITRATE (PF) 250 MCG/5ML IJ SOLN
INTRAMUSCULAR | Status: AC
  Filled 2019-07-24: qty 5

## 2019-07-24 MED ORDER — PROMETHAZINE HCL 25 MG/ML IJ SOLN: 6.2500 mg | INTRAMUSCULAR | Status: DC | PRN

## 2019-07-24 MED ORDER — DEXAMETHASONE SODIUM PHOSPHATE 10 MG/ML IJ SOLN
INTRAMUSCULAR | Status: DC | PRN
  Administered 2019-07-24: 10 mg via INTRAVENOUS

## 2019-07-24 MED ORDER — BUPIVACAINE LIPOSOME 1.3 % IJ SUSP
INTRAMUSCULAR | Status: DC | PRN
  Administered 2019-07-24: 20 mL

## 2019-07-24 MED ORDER — DIPHENHYDRAMINE HCL 25 MG PO CAPS: 25.0000 mg | ORAL_CAPSULE | ORAL | Status: DC | PRN

## 2019-07-24 MED ORDER — MIDAZOLAM HCL 2 MG/2ML IJ SOLN
INTRAMUSCULAR | Status: DC | PRN
  Administered 2019-07-24: 2 mg via INTRAVENOUS

## 2019-07-24 MED ORDER — PHENYLEPHRINE 40 MCG/ML (10ML) SYRINGE FOR IV PUSH (FOR BLOOD PRESSURE SUPPORT)
PREFILLED_SYRINGE | INTRAVENOUS | Status: DC | PRN
  Administered 2019-07-24 (×5): 80 ug via INTRAVENOUS

## 2019-07-24 MED ORDER — NALBUPHINE HCL 10 MG/ML IJ SOLN
5.0000 mg | INTRAMUSCULAR | Status: DC | PRN
  Filled 2019-07-24: qty 0.5

## 2019-07-24 MED ORDER — MIDAZOLAM HCL 2 MG/2ML IJ SOLN
INTRAMUSCULAR | Status: AC
  Filled 2019-07-24: qty 2

## 2019-07-24 MED ORDER — KETOROLAC TROMETHAMINE 15 MG/ML IJ SOLN: 15.0000 mg | Freq: Once | INTRAMUSCULAR | Status: DC

## 2019-07-24 MED ORDER — BUPIVACAINE LIPOSOME 1.3 % IJ SUSP
20.0000 mL | Freq: Once | INTRAMUSCULAR | Status: DC
  Filled 2019-07-24: qty 20

## 2019-07-24 MED ORDER — DEXAMETHASONE SODIUM PHOSPHATE 10 MG/ML IJ SOLN
INTRAMUSCULAR | Status: AC
  Filled 2019-07-24: qty 1

## 2019-07-24 MED ORDER — EPHEDRINE 5 MG/ML INJ
INTRAVENOUS | Status: AC
  Filled 2019-07-24: qty 10

## 2019-07-24 MED ORDER — BUPIVACAINE HCL (PF) 0.25 % IJ SOLN
INTRAMUSCULAR | Status: AC
  Filled 2019-07-24: qty 30

## 2019-07-24 MED ORDER — KETOROLAC TROMETHAMINE 30 MG/ML IJ SOLN
INTRAMUSCULAR | Status: AC
  Filled 2019-07-24: qty 1

## 2019-07-24 MED ORDER — DIPHENHYDRAMINE HCL 50 MG/ML IJ SOLN
12.5000 mg | Freq: Four times a day (QID) | INTRAMUSCULAR | Status: DC | PRN
  Administered 2019-07-24: 12.5 mg via INTRAVENOUS
  Filled 2019-07-24: qty 1

## 2019-07-24 MED ORDER — KETOROLAC TROMETHAMINE 30 MG/ML IJ SOLN
30.0000 mg | Freq: Once | INTRAMUSCULAR | Status: AC
  Administered 2019-07-24: 30 mg via INTRAVENOUS

## 2019-07-24 MED ORDER — FENTANYL CITRATE (PF) 250 MCG/5ML IJ SOLN
INTRAMUSCULAR | Status: DC | PRN
  Administered 2019-07-24 (×2): 50 ug via INTRAVENOUS

## 2019-07-24 MED ORDER — LEVOTHYROXINE SODIUM 100 MCG PO TABS
100.0000 ug | ORAL_TABLET | Freq: Every day | ORAL | Status: DC
  Administered 2019-07-25 – 2019-07-26 (×2): 100 ug via ORAL
  Filled 2019-07-24 (×3): qty 1

## 2019-07-24 SURGICAL SUPPLY — 47 items
APL SKNCLS STERI-STRIP NONHPOA (GAUZE/BANDAGES/DRESSINGS)
BARRIER ADHS 3X4 INTERCEED (GAUZE/BANDAGES/DRESSINGS) IMPLANT
BENZOIN TINCTURE PRP APPL 2/3 (GAUZE/BANDAGES/DRESSINGS) IMPLANT
BRR ADH 4X3 ABS CNTRL BYND (GAUZE/BANDAGES/DRESSINGS)
CANISTER SUCT 3000ML PPV (MISCELLANEOUS) ×2 IMPLANT
COVER WAND RF STERILE (DRAPES) ×1 IMPLANT
DECANTER SPIKE VIAL GLASS SM (MISCELLANEOUS) ×1 IMPLANT
DRAPE CESAREAN BIRTH W POUCH (DRAPES) ×2 IMPLANT
DRAPE WARM FLUID 44X44 (DRAPES) ×1 IMPLANT
DRSG OPSITE POSTOP 4X10 (GAUZE/BANDAGES/DRESSINGS) ×2 IMPLANT
DURAPREP 26ML APPLICATOR (WOUND CARE) ×2 IMPLANT
GAUZE 4X4 16PLY RFD (DISPOSABLE) ×2 IMPLANT
GLOVE BIO SURGEON STRL SZ7 (GLOVE) ×2 IMPLANT
GLOVE BIOGEL PI IND STRL 7.0 (GLOVE) ×2 IMPLANT
GLOVE BIOGEL PI INDICATOR 7.0 (GLOVE) ×2
GOWN STRL REUS W/ TWL LRG LVL3 (GOWN DISPOSABLE) ×3 IMPLANT
GOWN STRL REUS W/TWL LRG LVL3 (GOWN DISPOSABLE) ×6
HIBICLENS CHG 4% 4OZ BTL (MISCELLANEOUS) ×2 IMPLANT
KIT TURNOVER KIT B (KITS) ×2 IMPLANT
NDL HYPO 18GX1.5 BLUNT FILL (NEEDLE) IMPLANT
NDL SPNL 22GX3.5 QUINCKE BK (NEEDLE) ×1 IMPLANT
NEEDLE HYPO 18GX1.5 BLUNT FILL (NEEDLE) IMPLANT
NEEDLE SPNL 22GX3.5 QUINCKE BK (NEEDLE) ×2 IMPLANT
NS IRRIG 1000ML POUR BTL (IV SOLUTION) ×2 IMPLANT
PACK ABDOMINAL GYN (CUSTOM PROCEDURE TRAY) ×2 IMPLANT
PAD ARMBOARD 7.5X6 YLW CONV (MISCELLANEOUS) ×2 IMPLANT
PAD OB MATERNITY 4.3X12.25 (PERSONAL CARE ITEMS) ×2 IMPLANT
RTRCTR C-SECT PINK 25CM LRG (MISCELLANEOUS) ×2 IMPLANT
SPECIMEN JAR MEDIUM (MISCELLANEOUS) ×2 IMPLANT
SPONGE LAP 18X18 RF (DISPOSABLE) IMPLANT
STRIP CLOSURE SKIN 1/2X4 (GAUZE/BANDAGES/DRESSINGS) IMPLANT
SUT CHROMIC 3 0 SH 27 (SUTURE) IMPLANT
SUT MON AB 2-0 CT1 36 (SUTURE) IMPLANT
SUT MON AB 4-0 PS1 27 (SUTURE) ×2 IMPLANT
SUT PDS AB 0 CT1 27 (SUTURE) ×4 IMPLANT
SUT VIC AB 0 CT1 18XCR BRD8 (SUTURE) ×2 IMPLANT
SUT VIC AB 0 CT1 27 (SUTURE) ×2
SUT VIC AB 0 CT1 27XBRD ANBCTR (SUTURE) ×1 IMPLANT
SUT VIC AB 0 CT1 8-18 (SUTURE) ×4
SUT VIC AB 2-0 CT1 27 (SUTURE)
SUT VIC AB 2-0 CT1 TAPERPNT 27 (SUTURE) IMPLANT
SUT VIC AB 3-0 CT1 27 (SUTURE) ×2
SUT VIC AB 3-0 CT1 TAPERPNT 27 (SUTURE) ×1 IMPLANT
SUT VICRYL 0 TIES 12 18 (SUTURE) ×2 IMPLANT
SYR 20ML LL LF (SYRINGE) ×2 IMPLANT
TOWEL GREEN STERILE FF (TOWEL DISPOSABLE) ×4 IMPLANT
TRAY FOLEY W/BAG SLVR 14FR (SET/KITS/TRAYS/PACK) ×2 IMPLANT

## 2019-07-24 NOTE — Anesthesia Postprocedure Evaluation (Signed)
Anesthesia Post Note  Patient: Ashley Larsen  Procedure(s) Performed: HYSTERECTOMY ABDOMINAL, right salpingo oophorectomy (Right Abdomen)     Patient location during evaluation: PACU Anesthesia Type: Spinal Level of consciousness: awake Pain management: pain level controlled Vital Signs Assessment: post-procedure vital signs reviewed and stable Respiratory status: spontaneous breathing Cardiovascular status: stable Postop Assessment: no headache, no backache, spinal receding, patient able to bend at knees and no apparent nausea or vomiting Anesthetic complications: no    Last Vitals:  Vitals:   07/24/19 0900 07/24/19 0915  BP: 97/63 103/71  Pulse: 71 66  Resp: (!) 22 16  Temp: (!) 36 C   SpO2: 99% 96%    Last Pain:  Vitals:   07/24/19 0900  TempSrc:   PainSc: 0-No pain   Pain Goal: Patients Stated Pain Goal: 3 (07/24/19 9012)  LLE Motor Response: Purposeful movement, Responds to commands(weakly bends at knees to commands) (07/24/19 0900)   RLE Motor Response: Purposeful movement, Responds to commands(able to bend bilat knees to commands weakly) (07/24/19 0900)   L Sensory Level: L4-Anterior knee, lower leg (07/24/19 0900) R Sensory Level: L5-Outer lower leg, top of foot, great toe (07/24/19 0900) Epidural/Spinal Function Cutaneous sensation: Normal sensation (07/24/19 0900), Patient able to flex knees: Yes (07/24/19 0900), Patient able to lift hips off bed: No (07/24/19 0900), Back pain beyond tenderness at insertion site: No (07/24/19 0900), Progressively worsening motor and/or sensory loss: No (07/24/19 0900), Bowel and/or bladder incontinence post epidural: No (07/24/19 0900)  Huston Foley

## 2019-07-24 NOTE — Anesthesia Procedure Notes (Deleted)
Anesthesia Regional Block: Popliteal block   Pre-Anesthetic Checklist: ,, timeout performed, Correct Patient, Correct Site, Correct Laterality, Correct Procedure, Correct Position, site marked, Risks and benefits discussed,  Surgical consent,  Pre-op evaluation,  At surgeon's request and post-op pain management  Laterality: Lower and Left  Prep: chloraprep       Needles:  Injection technique: Single-shot  Needle Type: Echogenic Stimulator Needle     Needle Length: 10cm  Needle Gauge: 21     Additional Needles:   Procedures:,,,, ultrasound used (permanent image in chart),,,,  Narrative:  Start time: 07/24/2019 7:15 AM End time: 07/24/2019 7:24 AM Injection made incrementally with aspirations every 5 mL.  Performed by: Personally  Anesthesiologist: Lyn Hollingshead, MD

## 2019-07-24 NOTE — Transfer of Care (Signed)
Immediate Anesthesia Transfer of Care Note  Patient: Ashley Larsen  Procedure(s) Performed: HYSTERECTOMY ABDOMINAL, right salpingo oophorectomy (Right Abdomen)  Patient Location: PACU  Anesthesia Type:Spinal  Level of Consciousness: drowsy and patient cooperative  Airway & Oxygen Therapy: Patient Spontanous Breathing and Patient connected to nasal cannula oxygen  Post-op Assessment: Report given to RN, Post -op Vital signs reviewed and stable and Patient moving all extremities X 4  Post vital signs: Reviewed and stable  Last Vitals:  Vitals Value Taken Time  BP 97/63 07/24/19 0900  Temp    Pulse 73 07/24/19 0902  Resp 16 07/24/19 0902  SpO2 97 % 07/24/19 0902  Vitals shown include unvalidated device data.  Last Pain:  Vitals:   07/24/19 0623  TempSrc: Oral  PainSc: 4       Patients Stated Pain Goal: 3 (54/36/06 7703)  Complications: No apparent anesthesia complications

## 2019-07-24 NOTE — Progress Notes (Signed)
The patient was re-examined with no change in status, verified w/ pt Xarelto held last 2 days

## 2019-07-24 NOTE — Plan of Care (Signed)
  Problem: Education: Goal: Knowledge of General Education information will improve Description: Including pain rating scale, medication(s)/side effects and non-pharmacologic comfort measures Outcome: Progressing   Problem: Health Behavior/Discharge Planning: Goal: Ability to manage health-related needs will improve Outcome: Progressing   Problem: Clinical Measurements: Goal: Ability to maintain clinical measurements within normal limits will improve Outcome: Progressing Goal: Will remain free from infection Outcome: Progressing Goal: Diagnostic test results will improve Outcome: Progressing Goal: Respiratory complications will improve Outcome: Progressing Goal: Cardiovascular complication will be avoided Outcome: Progressing   Problem: Activity: Goal: Risk for activity intolerance will decrease Outcome: Progressing   Problem: Nutrition: Goal: Adequate nutrition will be maintained Outcome: Progressing   Problem: Coping: Goal: Level of anxiety will decrease Outcome: Progressing   Problem: Elimination: Goal: Will not experience complications related to bowel motility Outcome: Progressing Goal: Will not experience complications related to urinary retention Outcome: Progressing   Problem: Pain Managment: Goal: General experience of comfort will improve Outcome: Progressing   Problem: Skin Integrity: Goal: Risk for impaired skin integrity will decrease Outcome: Progressing   Problem: Education: Goal: Required Educational Video(s) Outcome: Progressing   Problem: Clinical Measurements: Goal: Postoperative complications will be avoided or minimized Outcome: Progressing   Problem: Skin Integrity: Goal: Demonstration of wound healing without infection will improve Outcome: Progressing

## 2019-07-24 NOTE — H&P (Signed)
H and PE dictated yesterday    69 yo PMP female w/ chronic pelvic pain, prior hx EL with LSO for Dermoid cyst, presents now for TAH USO. Pre op [place] in our office was WNL, absent L ovary. Hx factor 5 Leiden, on Xarelto, being held for 2 days pre op.  Received med clearance pre-op.  PE  BP 155 /88 afeb HEENT neg CV RRR w/o m,r,g Lungs clear Abd Soft + BS, well healed lower trans incision Pelvic Ut mid NS, ad neg  Imp  Chronic pelvic pain, prior LSO, prob adhesive dz  Plan  TAH, RSO>>proced + risks reviewed

## 2019-07-24 NOTE — H&P (Signed)
Ashley Larsen, ERTLE MEDICAL RECORD [id number] ACCOUNT [id number] DATE OF BIRTH:08-18-1950 FACILITY: [facility] LOCATION: [facility] PHYSICIAN:Deondrick Searls Garry Heater, MD  HISTORY AND PHYSICAL  DATE OF ADMISSION:  07/24/2019  CHIEF COMPLAINT:  Pelvic pain.  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  I saw initially April 2021 as a new patient for evaluation of pelvic pain.  We had tried to get her surgical records from her prior lower abdominal transverse laparotomy at which time  she had a LSO apparently for a dermoid.  She still has a uterus and presumably her right ovary.  We never could get her records to quantify exactly what she had had done.  However, she continued to have pelvic pain, probably related to adhesive disease  and presents at this time for TAH, RSO.  Ultrasound evaluation in our office dated April 2021 showed left ovary not visualized.  Supposedly history of an LSO.  No free fluid or other masses noted.  This procedure including the specific risks regarding bleeding, infection, transfusion, wound infection, phlebitis, her expected recovery time reviewed with her, which she understands and accepts.  She has a history of factor V Leiden, has been on  Xarelto, which has been held for 48 hours, preoperatively.  PAST MEDICAL HISTORY:    ALLERGIES:  OXYCODONE HAS CAUSE HALLUCINATIONS.  REVIEW OF SYSTEMS:  Her problem list include hypertension, osteoarthritis, factor V Leiden mutation with a history of a prior pulmonary embolus.  MEDICATIONS:  Amlodipine 5 mg daily, hydrocodone/Percocet p.r.n. pain, levothyroxine 100 mcg daily, losartan 100 mg daily, Xarelto 20 mg daily.  FAMILY HISTORY:  Significant for sister with colon cancer and hypertension.  Her brother also has hypertension.  Sister and brother both with diabetes.  Brother with kidney disease and asthma.  SOCIAL HISTORY:  She does have limited daily smoking.  Denies alcohol or other drug  use.  PAST SURGICAL HISTORY:  Three vaginal delivery.  January 1999 she states she had LSO.  She has also had a tubal ligation done in 1989.  Partial resection of a dermoid on the right as best she can remember.  PHYSICAL EXAMINATION: VITAL SIGNS:  Temperature 98.2, blood pressure 140/82. HEENT:  Unremarkable. NECK:  Supple, without masses. LUNGS:  Clear. CARDIOVASCULAR:  Regular rate and rhythm without murmurs, rubs or gallops. BREASTS:  Without masses, tenderness, or nipple discharge. ABDOMEN:  Soft, flat, nontender.  Well-healed Pfannenstiel transverse incision. PELVIC:  Vulva, vagina, cervix normal.  Uterus upper limit of normal size.  Adnexa negative as far as masses or nodularity. EXTREMITIES:  Unremarkable. NEUROLOGIC:  Unremarkable.  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  PLAN:  TAH RSO, the procedure, including risks discussed as above.  CN/NUANCE  D:07/23/2019 T:07/23/2019 JOB:[id number]

## 2019-07-24 NOTE — Op Note (Signed)
Preoperative diagnosis: Chronic pelvic pain, probable adhesions  Postoperative diagnosis: Same  Procedure: TAH, lysis of adhesions, RSO  Surgeon: Matthew Saras  Assistant: [place]  EBL 250 cc  Anesthesia spinal  Procedure and findings:  The patient was taken to the operating room after an adequate level of spinal anesthetic was obtained with the patient supine the abdomen and vagina were prepped and draped separately after appropriate timeouts were taken.  Transverse incision was made through the old transverse scar carried down the fascia which was incised and extended transversely.  Rectus muscles divided in the midline peritoneum entered superiorly without incident and extended in a vertical manner.  Pelvic findings revealed normal right tube and ovary, left tube and ovary surgically absent, there were some omental adhesions to the fundus of the uterus and the left lateral part of the uterus that were lysed in an avascular plane there was no bowel in this area.  Once this was accomplished an Chiropodist was positioned and the bowel was packed superiorly out of the field.  Long Kelly clamps were then placed at each utero-ovarian pedicle starting on the left this was clamped divided and suture ligated at the utero-ovarian pedicle the same repeated on the opposite side at the right IP ligament removing the right tube and ovary, the course of the right ureter was well below.  Bladder flap was then developed anteriorly and the bladder flap dissected with sharp and blunt dissection below the cervical vaginal junction.  In sequential manner the ascending branch of the uterine artery, cardinal ligament and uterine vasculature pedicles were clamped divided and suture-ligated, remaining pedicles were clamped divided and the specimen removed vaginal cuff was closed with interrupted 2-0 Vicryl sutures.  The pelvis was irrigated with saline observed noted to be hemostatic.  Prior to closure sponge, needle,  instrument counts reported as correct x2.  Peritoneum closed with a running 2-0 Vicryl suture.  The same to reapproximate the rectus muscles in the midline.  0 PDS was then used to close the fascia from laterally to midline on either side.  Diluted Exparel solution was then injected subfascially and subcutaneously for postoperative pain relief.  The subcutaneous tissue was hemostatic was closed with a running 3-0 Vicryl suture followed by 4-0 Monocryl subcuticular closure and a pressure dressing.  Clear urine noted at the end the case.  She tolerated this well went to recovery room in good condition.  Dictated with Dragon Medical 1  Margarette Asal MD

## 2019-07-24 NOTE — Anesthesia Procedure Notes (Signed)
Spinal  Patient location during procedure: OR Start time: 07/24/2019 7:24 AM End time: 07/24/2019 7:28 AM Staffing Performed: anesthesiologist  Anesthesiologist: Lyn Hollingshead, MD Preanesthetic Checklist Completed: patient identified, IV checked, site marked, risks and benefits discussed, surgical consent, monitors and equipment checked, pre-op evaluation and timeout performed Spinal Block Patient position: sitting Prep: DuraPrep and site prepped and draped Patient monitoring: continuous pulse ox and blood pressure Approach: midline Location: L3-4 Injection technique: single-shot Needle Needle type: Pencan  Needle gauge: 24 G Needle length: 10 cm Needle insertion depth: 6 cm Assessment Sensory level: T6

## 2019-07-25 LAB — CBC
HCT: 30.2 % — ABNORMAL LOW (ref 36.0–46.0)
Hemoglobin: 9.7 g/dL — ABNORMAL LOW (ref 12.0–15.0)
MCH: 25.3 pg — ABNORMAL LOW (ref 26.0–34.0)
MCHC: 32.1 g/dL (ref 30.0–36.0)
MCV: 78.9 fL — ABNORMAL LOW (ref 80.0–100.0)
Platelets: 264 10*3/uL (ref 150–400)
RBC: 3.83 MIL/uL — ABNORMAL LOW (ref 3.87–5.11)
RDW: 16.7 % — ABNORMAL HIGH (ref 11.5–15.5)
WBC: 6.9 10*3/uL (ref 4.0–10.5)
nRBC: 0 % (ref 0.0–0.2)

## 2019-07-25 LAB — SURGICAL PATHOLOGY

## 2019-07-25 MED ORDER — RIVAROXABAN 20 MG PO TABS
20.0000 mg | ORAL_TABLET | Freq: Every day | ORAL | Status: DC
  Administered 2019-07-25: 20 mg via ORAL
  Filled 2019-07-25: qty 1

## 2019-07-25 MED ORDER — ACETAMINOPHEN 500 MG PO TABS
1000.0000 mg | ORAL_TABLET | Freq: Three times a day (TID) | ORAL | Status: DC | PRN
  Administered 2019-07-25: 1000 mg via ORAL
  Filled 2019-07-25: qty 2

## 2019-07-25 NOTE — Progress Notes (Signed)
Patient assessed with a rectal temperature of 95.346F this a.m. @0508 .  Pt's room temperature was maxed out, patient received several heating packs in her groin, axillary and thoracic areas and several blankets were added to the patient while in bed.  MD Gaetano Net was notified @0530  of hypothermia and that all other VSS at this time and instructed to notify MD after re-assessing temperature if not improved.  At 0600, oral temperature notedly improved 97.346F with rectal temp still below parameters 96.63F.  Interventions were continued w/ improvement in temperature observed.  Will con't to monitor pt throughout the shift for any s/s of acute distress.

## 2019-07-25 NOTE — Plan of Care (Signed)

## 2019-07-25 NOTE — Progress Notes (Addendum)
4mL morphine PCA wasted in stericycle with Derald Macleod, RN.

## 2019-07-25 NOTE — Progress Notes (Signed)
POD 1 S// voided this am, had nausea during the night, but hungry now O//BP 131/75 (BP Location: Left Arm)   Pulse (!) 55   Temp (!) 96 F (35.6 C) (Rectal)   Resp 16   Ht 5\' 5"  (1.651 m)   Wt 82.2 kg   SpO2 99%   BMI 30.15 kg/m  Results for orders placed or performed during the hospital encounter of 07/24/19 (from the past 24 hour(s))  CBC     Status: Abnormal   Collection Time: 07/25/19  2:20 AM  Result Value Ref Range   WBC 6.9 4.0 - 10.5 K/uL   RBC 3.83 (L) 3.87 - 5.11 MIL/uL   Hemoglobin 9.7 (L) 12.0 - 15.0 g/dL   HCT 30.2 (L) 36.0 - 46.0 %   MCV 78.9 (L) 80.0 - 100.0 fL   MCH 25.3 (L) 26.0 - 34.0 pg   MCHC 32.1 30.0 - 36.0 g/dL   RDW 16.7 (H) 11.5 - 15.5 %   Platelets 264 150 - 400 K/uL   nRBC 0.0 0.0 - 0.2 %   Lungs cclear Absd dressing dry, soft + BS  A+P?POD 1 TAHRSO, temp drop during the night, better now. Sh has voided OK, nausea improved, now hungruy. CBC wnl, will restart Xarelto and check CBC noon>>ambulate>>>may still be able to D/C late afternoon if feeling better

## 2019-07-26 ENCOUNTER — Telehealth: Payer: Self-pay | Admitting: *Deleted

## 2019-07-26 MED ORDER — TRAMADOL HCL 50 MG PO TABS: 50.0000 mg | ORAL_TABLET | Freq: Four times a day (QID) | ORAL | 1 refills | Status: DC | PRN

## 2019-07-26 NOTE — Discharge Summary (Signed)
Physician Discharge Summary  Patient ID: Ashley Larsen MRN: [id number] DOB/AGE: 23-May-1950 69 y.o.  Admit date: 07/24/2019 Discharge date: 07/26/2019  Admission Diagnoses:pelvic pain  Discharge Diagnoses: same, pelvic adhesions Active Problems:   Pelvic pain   Discharged Condition: good  Hospital Course: adm for TAH + RSO, performed under spinal , on POD 1, diet advanced, her temp normalized, and ambulating, on POD2, afeb, Inc C/D, tol PO  Consults: None  Significant Diagnostic Studies: labs:  Results for orders placed or performed during the hospital encounter of 07/24/19 (from the past 48 hour(s))  Surgical pathology     Status: None   Collection Time: 07/24/19  8:04 AM  Result Value Ref Range   SURGICAL PATHOLOGY      SURGICAL PATHOLOGY CASE: [id number] PATIENT: Ashley Larsen Surgical Pathology Report     Clinical History: dermoid, severe right lower quadrant pain (cm)     FINAL MICROSCOPIC DIAGNOSIS:  A. UTERUS, CERVIX, RIGHT OVARY AND FALLOPIAN TUBE, HYSTERECTOMY: - Cervix     Nabothian cyst.     No dysplasia or carcinoma. - Endometrium     Inactive.     No hyperplasia or carcinoma. - Myometrium     Unremarkable.     No evidence of malignancy. -Right ovary     Tubo-ovarian adhesions.     No endometriosis or malignancy. -Right Fallopian tube     Tubo-ovarian adhesions.     No endometriosis or malignancy.   GROSS DESCRIPTION:  Specimen: Uterus, cervix, clinically right ovary and fallopian tube Specimen integrity: adnexa received separate Size and shape: 8 x 5 x 3.5 cm, symmetrical Weight: 45 g without adnexa Serosa: Smooth, tan-pink, focally hyperemic Cervix: 2.4 cm in length by 2.7 cm diameter within 0.5 cm slitlike os. Ectocervix is smooth, tan-pink.  E ndocervical canal is tan-pink, mucoid, free of lesions. Endometrium: 3.2 cm in length by 1.5 cm cornu to cornu, flattened, tan-pink, free of lesions Myometrium: Averages 1.8 cm in  thickness, mildly trabeculated, free of nodules Right adnexa: Clinically right fallopian tube is 4 cm in length by 0.5 cm diameter, fimbriated, adherent to the ovary, with a pinpoint lumen upon sectioning.  Ovary is 3 x 1.8 x 1.2 cm, tan-pink, wrinkled, firm, with unremarkable cut surface. Left adnexa: Not present Block Summary: 1-2 = cervix 3-4 = endomyometrium 5 = fallopian tube 6 = ovary with adherent fallopian tube (AK 07/24/2019)     Final Diagnosis performed by Ashley Laws, MD.   Electronically signed 07/25/2019 Technical component performed at [employer]. [facility], [place] [address], [place], [place] [address].  Professional component performed at [facility], [place] [address] [place], [place] [address].  Immunohistochemistry Technical component (if appli cable) was performed at [facility]. [address] [place], [place], [place] [address].   IMMUNOHISTOCHEMISTRY DISCLAIMER (if applicable): Some of these immunohistochemical stains may have been developed and the performance characteristics determine by [facility]. Some may not have been cleared or approved by the U.S. Food and Drug Administration. The FDA has determined that such clearance or approval is not necessary. This test is used for clinical purposes. It should not be regarded as investigational or for research. This laboratory is certified under the [employer] (CLIA-88) as qualified to perform high complexity clinical laboratory testing.  The controls stained appropriately.   CBC     Status: Abnormal   Collection Time: 07/25/19  2:20 AM  Result Value Ref Range   WBC 6.9  4.0 - 10.5 K/uL   RBC 3.83 (L) 3.87 - 5.11 MIL/uL   Hemoglobin 9.7 (L) 12.0 - 15.0 g/dL   HCT 30.2 (L) 36 - 46 %   MCV 78.9 (L) 80.0 - 100.0 fL   MCH 25.3 (L) 26.0 - 34.0 pg   MCHC 32.1 30.0 - 36.0 g/dL   RDW 16.7 (H) 11.5 - 15.5 %   Platelets  264 150 - 400 K/uL   nRBC 0.0 0.0 - 0.2 %    Comment: Performed at [facility] [address] [place], [place] [address]    Treatments: surgery: TAH RSO  Discharge Exam: Blood pressure (!) 142/79, pulse 82, temperature 98.5 F (36.9 C), temperature source Oral, resp. rate 17, height 5\' 5"  (1.651 m), weight 82.2 kg, SpO2 100 %. GI: abd soft, dressing dry, + BS  Disposition: Discharge disposition: 01-Home or Self Care        Allergies as of 07/26/2019      Reactions   Metronidazole Other (See Comments)   REACTION: upset stomach   Oxycodone-acetaminophen Other (See Comments)   Pt tolerates Vicodin Caused Halucinations      Medication List    STOP taking these medications   HYDROcodone-acetaminophen 5-325 MG tablet Commonly known as: NORCO/VICODIN     TAKE these medications   amLODipine 5 MG tablet Commonly known as: NORVASC Take 1 tablet (5 mg total) by mouth daily.   fexofenadine 180 MG tablet Commonly known as: ALLEGRA Take 1 tablet (180 mg total) by mouth daily as needed for allergies. What changed: additional instructions   fluticasone 50 MCG/ACT nasal spray Commonly known as: FLONASE Place 2 sprays into both nostrils daily. What changed:   when to take this  reasons to take this   GenTeal Tears 0.1-0.2-0.3 % Soln Place 1 drop into both eyes daily as needed (Dry eye).   levothyroxine 100 MCG tablet Commonly known as: SYNTHROID Take 1 tablet (100 mcg total) by mouth daily.   losartan 100 MG tablet Commonly known as: COZAAR TAKE 1 TABLET(100 MG) BY MOUTH DAILY What changed: See the new instructions.   potassium chloride 10 MEQ tablet Commonly known as: KLOR-CON TAKE 1 TABLET(10 MEQ) BY MOUTH DAILY   traMADol 50 MG tablet Commonly known as: Ultram Take 1 tablet (50 mg total) by mouth every 6 (six) hours as needed.   Vitamin D3 50 MCG (2000 UT) capsule Take 1 capsule (2,000 Units total) by mouth daily.   Vitamin D3 1.25 MG (50000 UT)  Caps Take 1 capsule by mouth once a week.   Xarelto 20 MG Tabs tablet Generic drug: rivaroxaban TAKE 1 TABLET BY MOUTH EVERY DAY WITH SUPPER What changed: See the new instructions.       Follow-up Information    Molli Posey, MD. Schedule an appointment as soon as possible for a visit in 1 week(s).   Specialty: Obstetrics and Gynecology Contact information: [place] [address] [phone]               Signed: Margarette Asal 07/26/2019, 7:04 AM

## 2019-07-26 NOTE — Telephone Encounter (Signed)
P t was on TCM report admitted 07/24/19 for pelvic pain. Pt underwent TAH + RSO, performed under spinal. Pt tolerated procedure well, and D/C 07/26/19. Pt will follow-up w/GYN provider Dr. Molli Posey.Marland KitchenJohny Chess

## 2019-07-26 NOTE — Discharge Planning (Signed)
Patient discharged home in stable condition. Verbalizes understanding of all discharge instructions, including home medications and follow up appointments. 

## 2019-08-01 DIAGNOSIS — Z09 Encounter for follow-up examination after completed treatment for conditions other than malignant neoplasm: Secondary | ICD-10-CM | POA: Diagnosis not present

## 2019-08-13 ENCOUNTER — Other Ambulatory Visit: Payer: Self-pay | Admitting: Endocrinology

## 2019-10-16 ENCOUNTER — Other Ambulatory Visit: Payer: Self-pay | Admitting: Internal Medicine

## 2019-10-17 ENCOUNTER — Ambulatory Visit: Payer: Medicare Other | Admitting: Internal Medicine

## 2019-10-24 ENCOUNTER — Ambulatory Visit (INDEPENDENT_AMBULATORY_CARE_PROVIDER_SITE_OTHER): Payer: Medicare Other | Admitting: Internal Medicine

## 2019-10-24 ENCOUNTER — Other Ambulatory Visit (INDEPENDENT_AMBULATORY_CARE_PROVIDER_SITE_OTHER): Payer: Medicare Other

## 2019-10-24 ENCOUNTER — Encounter: Payer: Self-pay | Admitting: Internal Medicine

## 2019-10-24 ENCOUNTER — Other Ambulatory Visit: Payer: Self-pay

## 2019-10-24 VITALS — BP 110/62 | HR 85 | Temp 98.5°F | Ht 65.0 in | Wt 175.0 lb

## 2019-10-24 DIAGNOSIS — M5440 Lumbago with sciatica, unspecified side: Secondary | ICD-10-CM

## 2019-10-24 DIAGNOSIS — I2699 Other pulmonary embolism without acute cor pulmonale: Secondary | ICD-10-CM

## 2019-10-24 DIAGNOSIS — G8929 Other chronic pain: Secondary | ICD-10-CM | POA: Diagnosis not present

## 2019-10-24 DIAGNOSIS — E559 Vitamin D deficiency, unspecified: Secondary | ICD-10-CM | POA: Diagnosis not present

## 2019-10-24 DIAGNOSIS — D649 Anemia, unspecified: Secondary | ICD-10-CM

## 2019-10-24 DIAGNOSIS — M869 Osteomyelitis, unspecified: Secondary | ICD-10-CM | POA: Diagnosis not present

## 2019-10-24 LAB — COMPREHENSIVE METABOLIC PANEL
ALT: 28 U/L (ref 0–35)
AST: 18 U/L (ref 0–37)
Albumin: 4.5 g/dL (ref 3.5–5.2)
Alkaline Phosphatase: 78 U/L (ref 39–117)
BUN: 11 mg/dL (ref 6–23)
CO2: 27 mEq/L (ref 19–32)
Calcium: 9.3 mg/dL (ref 8.4–10.5)
Chloride: 107 mEq/L (ref 96–112)
Creatinine, Ser: 0.78 mg/dL (ref 0.40–1.20)
GFR: 89.07 mL/min (ref >60.00)
Glucose, Bld: 103 mg/dL — ABNORMAL HIGH (ref 70–99)
Potassium: 3.7 mEq/L (ref 3.5–5.1)
Sodium: 140 mEq/L (ref 135–145)
Total Bilirubin: 0.3 mg/dL (ref 0.2–1.2)
Total Protein: 8 g/dL (ref 6.0–8.3)

## 2019-10-24 LAB — CBC WITH DIFFERENTIAL/PLATELET
Basophils Absolute: 0 10*3/uL (ref 0.0–0.1)
Basophils Relative: 0.5 % (ref 0.0–3.0)
Eosinophils Absolute: 0.2 10*3/uL (ref 0.0–0.7)
Eosinophils Relative: 3.3 % (ref 0.0–5.0)
HCT: 35.1 % — ABNORMAL LOW (ref 36.0–46.0)
Hemoglobin: 11.9 g/dL — ABNORMAL LOW (ref 12.0–15.0)
Lymphocytes Relative: 36.1 % (ref 12.0–46.0)
Lymphs Abs: 2 10*3/uL (ref 0.7–4.0)
MCHC: 33.9 g/dL (ref 30.0–36.0)
MCV: 81.7 fl (ref 78.0–100.0)
Monocytes Absolute: 0.5 10*3/uL (ref 0.1–1.0)
Monocytes Relative: 8.4 % (ref 3.0–12.0)
Neutro Abs: 2.9 10*3/uL (ref 1.4–7.7)
Neutrophils Relative %: 51.7 % (ref 43.0–77.0)
Platelets: 260 10*3/uL (ref 150.0–400.0)
RBC: 4.3 Mil/uL (ref 3.87–5.11)
RDW: 21 % — ABNORMAL HIGH (ref 11.5–15.5)
WBC: 5.7 10*3/uL (ref 4.0–10.5)

## 2019-10-24 MED ORDER — HYDROCODONE-ACETAMINOPHEN 5-325 MG PO TABS: 1.0000 | ORAL_TABLET | Freq: Three times a day (TID) | ORAL | 0 refills | Status: DC | PRN

## 2019-10-24 NOTE — Assessment & Plan Note (Signed)
Moderate on X ray, probably severe clinically Ortho ref offered Norco prn

## 2019-10-24 NOTE — Assessment & Plan Note (Signed)
On Xarelto 

## 2019-10-24 NOTE — Progress Notes (Signed)
Subjective:  Patient ID: Ashley Larsen, female    DOB: [birth year]  Age: 69 y.o. MRN: [id number]  CC: No chief complaint on file.   HPI Kendrah Lovern presents for LBP/OA  C/o R thigh/hip hurts after the fall, stiff   Outpatient Medications Prior to Visit  Medication Sig Dispense Refill  . amLODipine (NORVASC) 5 MG tablet Take 1 tablet (5 mg total) by mouth daily. 90 tablet 3  . Artificial Tear Solution (GENTEAL TEARS) 0.1-0.2-0.3 % SOLN Place 1 drop into both eyes daily as needed (Dry eye).    . Cholecalciferol (VITAMIN D3) 1.25 MG (50000 UT) CAPS Take 1 capsule by mouth once a week. 12 capsule 0  . Cholecalciferol (VITAMIN D3) 50 MCG (2000 UT) capsule Take 1 capsule (2,000 Units total) by mouth daily. 100 capsule 3  . Fe Cbn-Fe Gluc-FA-B12-C-DSS (FERRALET 90) 90-1 MG TABS Take 90 mg by mouth.    . fexofenadine (ALLEGRA) 180 MG tablet Take 1 tablet (180 mg total) by mouth daily as needed for allergies. (Patient taking differently: Take 180 mg by mouth daily as needed for allergies. 24 hours) 90 tablet 3  . fluticasone (FLONASE) 50 MCG/ACT nasal spray Place 2 sprays into both nostrils daily. (Patient taking differently: Place 2 sprays into both nostrils daily as needed for allergies or rhinitis. ) 16 g 6  . levothyroxine (SYNTHROID) 100 MCG tablet TAKE 1 TABLET(100 MCG) BY MOUTH DAILY 90 tablet 1  . losartan (COZAAR) 100 MG tablet TAKE 1 TABLET(100 MG) BY MOUTH DAILY 90 tablet 3  . potassium chloride (KLOR-CON) 10 MEQ tablet TAKE 1 TABLET(10 MEQ) BY MOUTH DAILY 90 tablet 1  . traMADol (ULTRAM) 50 MG tablet Take 1 tablet (50 mg total) by mouth every 6 (six) hours as needed. 30 tablet 1  . XARELTO 20 MG TABS tablet TAKE 1 TABLET BY MOUTH EVERY DAY WITH SUPPER (Patient taking differently: Take 20 mg by mouth daily with supper. ) 90 tablet 3   No facility-administered medications prior to visit.    ROS: Review of Systems  Constitutional: Negative for activity change, appetite change,  chills, fatigue and unexpected weight change.  HENT: Negative for congestion, mouth sores and sinus pressure.   Eyes: Negative for visual disturbance.  Respiratory: Negative for cough and chest tightness.   Gastrointestinal: Negative for abdominal pain and nausea.  Genitourinary: Negative for difficulty urinating, frequency and vaginal pain.  Musculoskeletal: Negative for back pain and gait problem.  Skin: Negative for pallor and rash.  Neurological: Negative for dizziness, tremors, weakness, numbness and headaches.  Psychiatric/Behavioral: Negative for confusion and sleep disturbance.    Objective:  BP 110/62 (BP Location: Left Arm, Patient Position: Sitting, Cuff Size: Large)   Pulse 85   Temp 98.5 F (36.9 C) (Oral)   Ht 5\' 5"  (1.651 m)   Wt 175 lb (79.4 kg)   SpO2 96%   BMI 29.12 kg/m   BP Readings from Last 3 Encounters:  10/24/19 110/62  07/26/19 (!) 142/79  07/17/19 130/72    Wt Readings from Last 3 Encounters:  10/24/19 175 lb (79.4 kg)  07/24/19 181 lb 3.2 oz (82.2 kg)  07/17/19 181 lb 9.6 oz (82.4 kg)    Physical Exam Constitutional:      General: She is not in acute distress.    Appearance: She is well-developed.  HENT:     Head: Normocephalic.     Right Ear: External ear normal.     Left Ear: External ear normal.  Nose: Nose normal.  Eyes:     General:        Right eye: No discharge.        Left eye: No discharge.     Conjunctiva/sclera: Conjunctivae normal.     Pupils: Pupils are equal, round, and reactive to light.  Neck:     Thyroid: No thyromegaly.     Vascular: No JVD.     Trachea: No tracheal deviation.  Cardiovascular:     Rate and Rhythm: Normal rate and regular rhythm.     Heart sounds: Normal heart sounds.  Pulmonary:     Effort: No respiratory distress.     Breath sounds: No stridor. No wheezing.  Abdominal:     General: Bowel sounds are normal. There is no distension.     Palpations: Abdomen is soft. There is no mass.      Tenderness: There is no abdominal tenderness. There is no guarding or rebound.  Musculoskeletal:        General: Tenderness present.     Cervical back: Normal range of motion and neck supple.  Lymphadenopathy:     Cervical: No cervical adenopathy.  Skin:    Findings: No erythema or rash.  Neurological:     Mental Status: She is oriented to person, place, and time.     Cranial Nerves: No cranial nerve deficit.     Motor: No abnormal muscle tone.     Coordination: Coordination normal.     Deep Tendon Reflexes: Reflexes normal.  Psychiatric:        Behavior: Behavior normal.        Thought Content: Thought content normal.        Judgment: Judgment normal.    R thigh hurts w/palpation and w/decr ROM  Lab Results  Component Value Date   WBC 6.9 07/25/2019   HGB 9.7 (L) 07/25/2019   HCT 30.2 (L) 07/25/2019   PLT 264 07/25/2019   GLUCOSE 78 07/17/2019   CHOL 212 (H) 01/13/2017   TRIG 104.0 01/13/2017   HDL 41.30 01/13/2017   LDLDIRECT 185.7 01/19/2012   LDLCALC 150 (H) 01/13/2017   ALT 20 07/17/2019   AST 17 07/17/2019   NA 141 07/17/2019   K 3.4 (L) 07/17/2019   CL 105 07/17/2019   CREATININE 0.77 07/17/2019   BUN 15 07/17/2019   CO2 27 07/17/2019   TSH 2.04 06/26/2019   INR 1.0 07/24/2019   HGBA1C 6.0 08/26/2008    No results found.  Assessment & Plan:    Walker Kehr, MD

## 2019-10-24 NOTE — Assessment & Plan Note (Signed)
Norco prn  Potential benefits of a long term opioids use as well as potential risks (i.e. addiction risk, apnea etc) and complications (i.e. Somnolence, constipation and others) were explained to the patient and were aknowledged. 

## 2019-10-24 NOTE — Assessment & Plan Note (Signed)
Vit D 

## 2019-10-24 NOTE — Addendum Note (Signed)
Addended by: Trenda Moots on: 09/19/7306 04:59 PM   Modules accepted: Orders

## 2019-10-25 LAB — VITAMIN D 25 HYDROXY (VIT D DEFICIENCY, FRACTURES): VITD: 46.83 ng/mL (ref 30.00–100.00)

## 2019-10-25 LAB — FERRITIN: Ferritin: 19.9 ng/mL (ref 10.0–291.0)

## 2020-01-07 ENCOUNTER — Telehealth: Payer: Self-pay | Admitting: Internal Medicine

## 2020-01-07 NOTE — Telephone Encounter (Signed)
HYDROcodone-acetaminophen (NORCO/VICODIN) 5-325 MG tablet [facility] [phone] - [place], [place] AT [place] Phone:  [phone]  Fax:  [phone]     Requesting a refill Last seen- 09.08.21 Next apt- 12.09.21

## 2020-01-08 MED ORDER — HYDROCODONE-ACETAMINOPHEN 5-325 MG PO TABS: 1.0000 | ORAL_TABLET | Freq: Three times a day (TID) | ORAL | 0 refills | Status: DC | PRN

## 2020-01-16 ENCOUNTER — Other Ambulatory Visit: Payer: Self-pay | Admitting: Internal Medicine

## 2020-01-16 DIAGNOSIS — H2513 Age-related nuclear cataract, bilateral: Secondary | ICD-10-CM | POA: Diagnosis not present

## 2020-01-16 DIAGNOSIS — H40013 Open angle with borderline findings, low risk, bilateral: Secondary | ICD-10-CM | POA: Diagnosis not present

## 2020-01-16 DIAGNOSIS — E0501 Thyrotoxicosis with diffuse goiter with thyrotoxic crisis or storm: Secondary | ICD-10-CM | POA: Diagnosis not present

## 2020-01-16 DIAGNOSIS — E0500 Thyrotoxicosis with diffuse goiter without thyrotoxic crisis or storm: Secondary | ICD-10-CM | POA: Diagnosis not present

## 2020-01-23 LAB — LIPID PANEL
Cholesterol: 248 — AB (ref 0–200)
Cholesterol: 248 — AB (ref 0–200)
HDL: 52 (ref 35–70)
LDL Cholesterol: 173
LDL Cholesterol: 173
LDl/HDL Ratio: 52
Triglycerides: 129 (ref 40–160)
Triglycerides: 129 (ref 40–160)

## 2020-01-23 LAB — HEMOGLOBIN A1C
Hemoglobin A1C: 5.6
Hemoglobin A1C: 5.6

## 2020-01-24 ENCOUNTER — Ambulatory Visit (INDEPENDENT_AMBULATORY_CARE_PROVIDER_SITE_OTHER): Payer: Medicare Other | Admitting: Internal Medicine

## 2020-01-24 ENCOUNTER — Other Ambulatory Visit: Payer: Self-pay

## 2020-01-24 ENCOUNTER — Encounter: Payer: Self-pay | Admitting: Internal Medicine

## 2020-01-24 ENCOUNTER — Telehealth: Payer: Self-pay | Admitting: Internal Medicine

## 2020-01-24 VITALS — BP 128/78 | HR 76 | Temp 98.9°F | Wt 173.8 lb

## 2020-01-24 DIAGNOSIS — Z23 Encounter for immunization: Secondary | ICD-10-CM | POA: Diagnosis not present

## 2020-01-24 DIAGNOSIS — Z7901 Long term (current) use of anticoagulants: Secondary | ICD-10-CM

## 2020-01-24 DIAGNOSIS — M545 Low back pain, unspecified: Secondary | ICD-10-CM

## 2020-01-24 DIAGNOSIS — I10 Essential (primary) hypertension: Secondary | ICD-10-CM | POA: Diagnosis not present

## 2020-01-24 MED ORDER — POTASSIUM CHLORIDE ER 10 MEQ PO TBCR: EXTENDED_RELEASE_TABLET | ORAL | 3 refills | Status: DC

## 2020-01-24 NOTE — Progress Notes (Signed)
Subjective:  Patient ID: Ashley Larsen, female    DOB: 03-Mar-1950  Age: 69 y.o. MRN: [id number]  CC: Follow-up (3 MONTH)   HPI Ashley Larsen presents for LBP, anticoagulation for PE, HTN C/o R ear ache last week  - resolved   Outpatient Medications Prior to Visit  Medication Sig Dispense Refill  . amLODipine (NORVASC) 5 MG tablet Take 1 tablet (5 mg total) by mouth daily. 90 tablet 3  . Artificial Tear Solution (GENTEAL TEARS) 0.1-0.2-0.3 % SOLN Place 1 drop into both eyes daily as needed (Dry eye).    . Cholecalciferol (VITAMIN D3) 50 MCG (2000 UT) capsule Take 1 capsule (2,000 Units total) by mouth daily. 100 capsule 3  . fexofenadine (ALLEGRA) 180 MG tablet Take 1 tablet (180 mg total) by mouth daily as needed for allergies. (Patient taking differently: Take 180 mg by mouth daily as needed for allergies. 24 hours) 90 tablet 3  . fluticasone (FLONASE) 50 MCG/ACT nasal spray Place 2 sprays into both nostrils daily. (Patient taking differently: Place 2 sprays into both nostrils daily as needed for allergies or rhinitis.) 16 g 6  . HYDROcodone-acetaminophen (NORCO/VICODIN) 5-325 MG tablet Take 1 tablet by mouth every 8 (eight) hours as needed for severe pain. 90 tablet 0  . levothyroxine (SYNTHROID) 100 MCG tablet TAKE 1 TABLET(100 MCG) BY MOUTH DAILY 90 tablet 1  . losartan (COZAAR) 100 MG tablet TAKE 1 TABLET(100 MG) BY MOUTH DAILY 90 tablet 3  . potassium chloride (KLOR-CON) 10 MEQ tablet TAKE 1 TABLET(10 MEQ) BY MOUTH DAILY 90 tablet 1  . XARELTO 20 MG TABS tablet TAKE 1 TABLET BY MOUTH EVERY DAY WITH SUPPER 90 tablet 3  . Cholecalciferol (VITAMIN D3) 1.25 MG (50000 UT) CAPS Take 1 capsule by mouth once a week. (Patient not taking: Reported on 01/24/2020) 12 capsule 0  . Fe Cbn-Fe Gluc-FA-B12-C-DSS (FERRALET 90) 90-1 MG TABS Take 90 mg by mouth. (Patient not taking: Reported on 01/24/2020)     No facility-administered medications prior to visit.    ROS: Review of Systems   Constitutional: Negative for activity change, appetite change, chills, fatigue and unexpected weight change.  HENT: Negative for congestion, mouth sores and sinus pressure.   Eyes: Negative for visual disturbance.  Respiratory: Negative for cough and chest tightness.   Gastrointestinal: Negative for abdominal pain and nausea.  Genitourinary: Negative for difficulty urinating, frequency and vaginal pain.  Musculoskeletal: Positive for arthralgias and back pain. Negative for gait problem.  Skin: Negative for pallor and rash.  Neurological: Negative for dizziness, tremors, weakness, numbness and headaches.  Psychiatric/Behavioral: Negative for confusion and sleep disturbance.    Objective:  BP 128/78 (BP Location: Left Arm)   Pulse 76   Temp 98.9 F (37.2 C) (Oral)   Wt 173 lb 12.8 oz (78.8 kg)   SpO2 98%   BMI 28.92 kg/m   BP Readings from Last 3 Encounters:  01/24/20 128/78  10/24/19 110/62  07/26/19 (!) 142/79    Wt Readings from Last 3 Encounters:  01/24/20 173 lb 12.8 oz (78.8 kg)  10/24/19 175 lb (79.4 kg)  07/24/19 181 lb 3.2 oz (82.2 kg)    Physical Exam Constitutional:      General: She is not in acute distress.    Appearance: She is well-developed.  HENT:     Head: Normocephalic.     Right Ear: External ear normal.     Left Ear: External ear normal.     Nose: Nose normal.  Mouth/Throat:     Mouth: Oropharynx is clear and moist.  Eyes:     General:        Right eye: No discharge.        Left eye: No discharge.     Conjunctiva/sclera: Conjunctivae normal.     Pupils: Pupils are equal, round, and reactive to light.  Neck:     Thyroid: No thyromegaly.     Vascular: No JVD.     Trachea: No tracheal deviation.  Cardiovascular:     Rate and Rhythm: Normal rate and regular rhythm.     Heart sounds: Normal heart sounds.  Pulmonary:     Effort: No respiratory distress.     Breath sounds: No stridor. No wheezing.  Abdominal:     General: Bowel sounds are  normal. There is no distension.     Palpations: Abdomen is soft. There is no mass.     Tenderness: There is no abdominal tenderness. There is no guarding or rebound.  Musculoskeletal:        General: Tenderness present. No edema.     Cervical back: Normal range of motion and neck supple.  Lymphadenopathy:     Cervical: No cervical adenopathy.  Skin:    Findings: No erythema or rash.  Neurological:     Mental Status: She is oriented to person, place, and time.     Cranial Nerves: No cranial nerve deficit.     Motor: No abnormal muscle tone.     Coordination: Coordination normal.     Deep Tendon Reflexes: Reflexes normal.  Psychiatric:        Mood and Affect: Mood and affect normal.        Behavior: Behavior normal.        Thought Content: Thought content normal.        Judgment: Judgment normal.   LS tender B ears WNL  Lab Results  Component Value Date   WBC 5.7 10/24/2019   HGB 11.9 (L) 10/24/2019   HCT 35.1 (L) 10/24/2019   PLT 260.0 10/24/2019   GLUCOSE 103 (H) 10/24/2019   CHOL 212 (H) 01/13/2017   TRIG 104.0 01/13/2017   HDL 41.30 01/13/2017   LDLDIRECT 185.7 01/19/2012   LDLCALC 150 (H) 01/13/2017   ALT 28 10/24/2019   AST 18 10/24/2019   NA 140 10/24/2019   K 3.7 10/24/2019   CL 107 10/24/2019   CREATININE 0.78 10/24/2019   BUN 11 10/24/2019   CO2 27 10/24/2019   TSH 2.04 06/26/2019   INR 1.0 07/24/2019   HGBA1C 6.0 08/26/2008    No results found.  Assessment & Plan:    Walker Kehr, MD

## 2020-01-24 NOTE — Telephone Encounter (Signed)
error 

## 2020-02-13 ENCOUNTER — Other Ambulatory Visit: Payer: Self-pay | Admitting: Endocrinology

## 2020-03-27 ENCOUNTER — Telehealth: Payer: Self-pay | Admitting: Internal Medicine

## 2020-03-27 NOTE — Telephone Encounter (Signed)
  HYDROcodone-acetaminophen (NORCO/VICODIN) 5-325 MG tablet [facility] [phone] - [address], [place] AT [place] Phone:  [phone]  Fax:  [phone]     Requesting a refill

## 2020-03-28 MED ORDER — HYDROCODONE-ACETAMINOPHEN 5-325 MG PO TABS: 1.0000 | ORAL_TABLET | Freq: Three times a day (TID) | ORAL | 0 refills | Status: DC | PRN

## 2020-03-28 NOTE — Telephone Encounter (Signed)
Okay.  Thanks.

## 2020-03-31 ENCOUNTER — Other Ambulatory Visit: Payer: Self-pay | Admitting: Internal Medicine

## 2020-04-23 ENCOUNTER — Encounter: Payer: Self-pay | Admitting: Internal Medicine

## 2020-04-23 ENCOUNTER — Ambulatory Visit (INDEPENDENT_AMBULATORY_CARE_PROVIDER_SITE_OTHER): Payer: Medicare Other | Admitting: Internal Medicine

## 2020-04-23 ENCOUNTER — Other Ambulatory Visit: Payer: Self-pay

## 2020-04-23 VITALS — BP 130/78 | HR 72 | Temp 98.3°F | Ht 65.0 in | Wt 175.4 lb

## 2020-04-23 DIAGNOSIS — D649 Anemia, unspecified: Secondary | ICD-10-CM

## 2020-04-23 DIAGNOSIS — J3089 Other allergic rhinitis: Secondary | ICD-10-CM

## 2020-04-23 DIAGNOSIS — I2699 Other pulmonary embolism without acute cor pulmonale: Secondary | ICD-10-CM

## 2020-04-23 DIAGNOSIS — J302 Other seasonal allergic rhinitis: Secondary | ICD-10-CM | POA: Diagnosis not present

## 2020-04-23 DIAGNOSIS — E890 Postprocedural hypothyroidism: Secondary | ICD-10-CM | POA: Diagnosis not present

## 2020-04-23 DIAGNOSIS — I10 Essential (primary) hypertension: Secondary | ICD-10-CM | POA: Diagnosis not present

## 2020-04-23 DIAGNOSIS — J449 Chronic obstructive pulmonary disease, unspecified: Secondary | ICD-10-CM | POA: Diagnosis not present

## 2020-04-23 LAB — CBC WITH DIFFERENTIAL/PLATELET
Basophils Absolute: 0 10*3/uL (ref 0.0–0.1)
Basophils Relative: 0.6 % (ref 0.0–3.0)
Eosinophils Absolute: 0.1 10*3/uL (ref 0.0–0.7)
Eosinophils Relative: 1.8 % (ref 0.0–5.0)
HCT: 38 % (ref 36.0–46.0)
Hemoglobin: 13.2 g/dL (ref 12.0–15.0)
Lymphocytes Relative: 35.9 % (ref 12.0–46.0)
Lymphs Abs: 1.7 10*3/uL (ref 0.7–4.0)
MCHC: 34.8 g/dL (ref 30.0–36.0)
MCV: 89.2 fl (ref 78.0–100.0)
Monocytes Absolute: 0.4 10*3/uL (ref 0.1–1.0)
Monocytes Relative: 8.5 % (ref 3.0–12.0)
Neutro Abs: 2.6 10*3/uL (ref 1.4–7.7)
Neutrophils Relative %: 53.2 % (ref 43.0–77.0)
Platelets: 254 10*3/uL (ref 150.0–400.0)
RBC: 4.26 Mil/uL (ref 3.87–5.11)
RDW: 13.3 % (ref 11.5–15.5)
WBC: 4.8 10*3/uL (ref 4.0–10.5)

## 2020-04-23 LAB — COMPREHENSIVE METABOLIC PANEL
ALT: 19 U/L (ref 0–35)
AST: 17 U/L (ref 0–37)
Albumin: 4.3 g/dL (ref 3.5–5.2)
Alkaline Phosphatase: 76 U/L (ref 39–117)
BUN: 14 mg/dL (ref 6–23)
CO2: 28 mEq/L (ref 19–32)
Calcium: 10.2 mg/dL (ref 8.4–10.5)
Chloride: 104 mEq/L (ref 96–112)
Creatinine, Ser: 0.87 mg/dL (ref 0.40–1.20)
GFR: 68.79 mL/min (ref >60.00)
Glucose, Bld: 76 mg/dL (ref 70–99)
Potassium: 4 mEq/L (ref 3.5–5.1)
Sodium: 139 mEq/L (ref 135–145)
Total Bilirubin: 0.5 mg/dL (ref 0.2–1.2)
Total Protein: 8.2 g/dL (ref 6.0–8.3)

## 2020-04-23 NOTE — Assessment & Plan Note (Signed)
On Levothroid TSH, FT4

## 2020-04-23 NOTE — Assessment & Plan Note (Signed)
Breo

## 2020-04-23 NOTE — Assessment & Plan Note (Signed)
Losartan, Norvasc

## 2020-04-23 NOTE — Progress Notes (Signed)
Subjective:  Patient ID: Ashley Larsen, female    DOB: 25-Jan-1951  Age: 70 y.o. MRN: [id number]  CC: Follow-up (3 month f/u)   HPI Ashley Larsen presents for HTN, COPD, LBP f/u  Outpatient Medications Prior to Visit  Medication Sig Dispense Refill  . amLODipine (NORVASC) 5 MG tablet TAKE 1 TABLET(5 MG) BY MOUTH DAILY 90 tablet 2  . Artificial Tear Solution (GENTEAL TEARS) 0.1-0.2-0.3 % SOLN Place 1 drop into both eyes daily as needed (Dry eye).    . Cholecalciferol (VITAMIN D3) 50 MCG (2000 UT) capsule Take 1 capsule (2,000 Units total) by mouth daily. 100 capsule 3  . fexofenadine (ALLEGRA) 180 MG tablet Take 1 tablet (180 mg total) by mouth daily as needed for allergies. (Patient taking differently: Take 180 mg by mouth daily as needed for allergies. 24 hours) 90 tablet 3  . fluticasone (FLONASE) 50 MCG/ACT nasal spray Place 2 sprays into both nostrils daily. (Patient taking differently: Place 2 sprays into both nostrils daily as needed for allergies or rhinitis.) 16 g 6  . HYDROcodone-acetaminophen (NORCO/VICODIN) 5-325 MG tablet Take 1 tablet by mouth every 8 (eight) hours as needed for severe pain. 90 tablet 0  . levothyroxine (SYNTHROID) 100 MCG tablet TAKE 1 TABLET(100 MCG) BY MOUTH DAILY 90 tablet 1  . losartan (COZAAR) 100 MG tablet TAKE 1 TABLET(100 MG) BY MOUTH DAILY 90 tablet 3  . potassium chloride (KLOR-CON) 10 MEQ tablet TAKE 1 TABLET(10 MEQ) BY MOUTH DAILY 90 tablet 3  . XARELTO 20 MG TABS tablet TAKE 1 TABLET BY MOUTH EVERY DAY WITH SUPPER 90 tablet 3   No facility-administered medications prior to visit.    ROS: Review of Systems  Constitutional: Negative for activity change, appetite change, chills, fatigue and unexpected weight change.  HENT: Negative for congestion, mouth sores and sinus pressure.   Eyes: Negative for visual disturbance.  Respiratory: Negative for cough and chest tightness.   Gastrointestinal: Negative for abdominal pain and nausea.   Genitourinary: Negative for difficulty urinating, frequency and vaginal pain.  Musculoskeletal: Positive for back pain. Negative for gait problem.  Skin: Negative for pallor and rash.  Neurological: Negative for dizziness, tremors, weakness, numbness and headaches.  Psychiatric/Behavioral: Negative for confusion and sleep disturbance.    Objective:  BP 130/78 (BP Location: Left Arm)   Pulse 72   Temp 98.3 F (36.8 C) (Oral)   Ht 5\' 5"  (1.651 m)   Wt 175 lb 6.4 oz (79.6 kg)   SpO2 96%   BMI 29.19 kg/m   BP Readings from Last 3 Encounters:  04/23/20 130/78  01/24/20 128/78  10/24/19 110/62    Wt Readings from Last 3 Encounters:  04/23/20 175 lb 6.4 oz (79.6 kg)  01/24/20 173 lb 12.8 oz (78.8 kg)  10/24/19 175 lb (79.4 kg)    Physical Exam Constitutional:      General: She is not in acute distress.    Appearance: She is well-developed. She is obese.  HENT:     Head: Normocephalic.     Right Ear: External ear normal.     Left Ear: External ear normal.     Nose: Nose normal.     Mouth/Throat:     Mouth: Oropharynx is clear and moist.  Eyes:     General:        Right eye: No discharge.        Left eye: No discharge.     Conjunctiva/sclera: Conjunctivae normal.     Pupils: Pupils are  equal, round, and reactive to light.  Neck:     Thyroid: No thyromegaly.     Vascular: No JVD.     Trachea: No tracheal deviation.  Cardiovascular:     Rate and Rhythm: Normal rate and regular rhythm.     Heart sounds: Normal heart sounds.  Pulmonary:     Effort: No respiratory distress.     Breath sounds: No stridor. No wheezing.  Abdominal:     General: Bowel sounds are normal. There is no distension.     Palpations: Abdomen is soft. There is no mass.     Tenderness: There is no abdominal tenderness. There is no guarding or rebound.  Musculoskeletal:        General: No tenderness or edema.     Cervical back: Normal range of motion and neck supple.  Lymphadenopathy:      Cervical: No cervical adenopathy.  Skin:    Findings: No erythema or rash.  Neurological:     Cranial Nerves: No cranial nerve deficit.     Motor: No abnormal muscle tone.     Coordination: Coordination normal.     Deep Tendon Reflexes: Reflexes normal.  Psychiatric:        Mood and Affect: Mood and affect normal.        Behavior: Behavior normal.        Thought Content: Thought content normal.        Judgment: Judgment normal.     Lab Results  Component Value Date   WBC 5.7 10/24/2019   HGB 11.9 (L) 10/24/2019   HCT 35.1 (L) 10/24/2019   PLT 260.0 10/24/2019   GLUCOSE 103 (H) 10/24/2019   CHOL 212 (H) 01/13/2017   TRIG 104.0 01/13/2017   HDL 41.30 01/13/2017   LDLDIRECT 185.7 01/19/2012   LDLCALC 150 (H) 01/13/2017   ALT 28 10/24/2019   AST 18 10/24/2019   NA 140 10/24/2019   K 3.7 10/24/2019   CL 107 10/24/2019   CREATININE 0.78 10/24/2019   BUN 11 10/24/2019   CO2 27 10/24/2019   TSH 2.04 06/26/2019   INR 1.0 07/24/2019   HGBA1C 6.0 08/26/2008    No results found.  Assessment & Plan:    Walker Kehr, MD

## 2020-04-23 NOTE — Assessment & Plan Note (Signed)
On Loratidine po

## 2020-04-23 NOTE — Assessment & Plan Note (Signed)
On Xarelto 

## 2020-04-23 NOTE — Addendum Note (Signed)
Addended by: Jacobo Forest on: 04/23/2020 02:26 PM   Modules accepted: Orders

## 2020-04-24 LAB — TSH: TSH: 2.4 u[IU]/mL (ref 0.35–4.50)

## 2020-04-24 LAB — IRON,TIBC AND FERRITIN PANEL
%SAT: 43 % (calc) (ref 16–45)
Ferritin: 21 ng/mL (ref 16–288)
Iron: 138 ug/dL (ref 45–160)
TIBC: 324 mcg/dL (calc) (ref 250–450)

## 2020-04-24 LAB — T4, FREE: Free T4: 0.81 ng/dL (ref 0.60–1.60)

## 2020-06-03 LAB — LIPID PANEL
Cholesterol: 230 — AB (ref 0–200)
HDL: 48 (ref 35–70)
LDL Cholesterol: 162
Triglycerides: 109 (ref 40–160)

## 2020-06-03 LAB — BASIC METABOLIC PANEL: Glucose: 88

## 2020-06-03 LAB — HEMOGLOBIN A1C: Hemoglobin A1C: 5.6

## 2020-06-10 ENCOUNTER — Encounter: Payer: Self-pay | Admitting: Internal Medicine

## 2020-07-17 ENCOUNTER — Telehealth: Payer: Self-pay | Admitting: Internal Medicine

## 2020-07-17 NOTE — Telephone Encounter (Signed)
Please Advise Called and spoke with pt, she states she lost her last bottle of xarelto and pharmacy will not fill until next month. Is it ok to give samples?

## 2020-07-17 NOTE — Telephone Encounter (Signed)
Patient called and said that she miss placed her XARELTO 20 MG TABS tablet and was wondering if Dr. Alain Marion had some samples for her. She can be reached at [phone]. Please advise

## 2020-07-18 NOTE — Telephone Encounter (Signed)
Ok if we have Thx

## 2020-07-22 NOTE — Telephone Encounter (Signed)
Patient calling to status of  Xarelto samples. When can she pick up?

## 2020-07-22 NOTE — Telephone Encounter (Signed)
Called and spoke with pt about getting samples of xarelto until she can refill her prescription next month. Pt states she will come by the office today to pick up a 2 week supply.

## 2020-07-24 ENCOUNTER — Other Ambulatory Visit: Payer: Self-pay

## 2020-07-24 ENCOUNTER — Ambulatory Visit (INDEPENDENT_AMBULATORY_CARE_PROVIDER_SITE_OTHER): Payer: Medicare Other | Admitting: Internal Medicine

## 2020-07-24 ENCOUNTER — Encounter: Payer: Self-pay | Admitting: Internal Medicine

## 2020-07-24 DIAGNOSIS — Z86711 Personal history of pulmonary embolism: Secondary | ICD-10-CM | POA: Diagnosis not present

## 2020-07-24 DIAGNOSIS — I10 Essential (primary) hypertension: Secondary | ICD-10-CM | POA: Diagnosis not present

## 2020-07-24 DIAGNOSIS — J3089 Other allergic rhinitis: Secondary | ICD-10-CM | POA: Diagnosis not present

## 2020-07-24 DIAGNOSIS — M5440 Lumbago with sciatica, unspecified side: Secondary | ICD-10-CM

## 2020-07-24 DIAGNOSIS — G8929 Other chronic pain: Secondary | ICD-10-CM

## 2020-07-24 DIAGNOSIS — J302 Other seasonal allergic rhinitis: Secondary | ICD-10-CM | POA: Diagnosis not present

## 2020-07-24 MED ORDER — FEXOFENADINE HCL 180 MG PO TABS: 180.0000 mg | ORAL_TABLET | Freq: Every day | ORAL | 3 refills | Status: AC | PRN

## 2020-07-24 MED ORDER — METHYLPREDNISOLONE 4 MG PO TBPK: ORAL_TABLET | ORAL | 0 refills | Status: DC

## 2020-07-24 MED ORDER — HYDROCODONE-ACETAMINOPHEN 5-325 MG PO TABS: 1.0000 | ORAL_TABLET | Freq: Three times a day (TID) | ORAL | 0 refills | Status: DC | PRN

## 2020-07-24 MED ORDER — FLUNISOLIDE 25 MCG/ACT (0.025%) NA SOLN: 2.0000 | Freq: Two times a day (BID) | NASAL | 5 refills | Status: DC

## 2020-07-24 NOTE — Assessment & Plan Note (Signed)
Cont w/ Losartan, Norvasc

## 2020-07-24 NOTE — Progress Notes (Signed)
Subjective:  Patient ID: Ashley Larsen, female    DOB: 1950/10/07  Age: 70 y.o. MRN: [id number]  CC: Follow-up (3 month f/u)   HPI Ashley Larsen presents for allergies - worse, LBP,  PE C/o sneezing  Outpatient Medications Prior to Visit  Medication Sig Dispense Refill   amLODipine (NORVASC) 5 MG tablet TAKE 1 TABLET(5 MG) BY MOUTH DAILY 90 tablet 2   Artificial Tear Solution (GENTEAL TEARS) 0.1-0.2-0.3 % SOLN Place 1 drop into both eyes daily as needed (Dry eye).     Cholecalciferol (VITAMIN D3) 50 MCG (2000 UT) capsule Take 1 capsule (2,000 Units total) by mouth daily. 100 capsule 3   fexofenadine (ALLEGRA) 180 MG tablet Take 1 tablet (180 mg total) by mouth daily as needed for allergies. (Patient taking differently: Take 180 mg by mouth daily as needed for allergies. 24 hours) 90 tablet 3   fluticasone (FLONASE) 50 MCG/ACT nasal spray Place 2 sprays into both nostrils daily. (Patient taking differently: Place 2 sprays into both nostrils daily as needed for allergies or rhinitis.) 16 g 6   HYDROcodone-acetaminophen (NORCO/VICODIN) 5-325 MG tablet Take 1 tablet by mouth every 8 (eight) hours as needed for severe pain. 90 tablet 0   levothyroxine (SYNTHROID) 100 MCG tablet TAKE 1 TABLET(100 MCG) BY MOUTH DAILY 90 tablet 1   losartan (COZAAR) 100 MG tablet TAKE 1 TABLET(100 MG) BY MOUTH DAILY 90 tablet 3   potassium chloride (KLOR-CON) 10 MEQ tablet TAKE 1 TABLET(10 MEQ) BY MOUTH DAILY 90 tablet 3   XARELTO 20 MG TABS tablet TAKE 1 TABLET BY MOUTH EVERY DAY WITH SUPPER 90 tablet 3   No facility-administered medications prior to visit.    ROS: Review of Systems  Constitutional:  Positive for fatigue. Negative for activity change, appetite change, chills and unexpected weight change.  HENT:  Positive for postnasal drip, rhinorrhea and sneezing. Negative for congestion, mouth sores, nosebleeds and sinus pressure.   Eyes:  Negative for visual disturbance.  Respiratory:  Negative for  cough and chest tightness.   Gastrointestinal:  Negative for abdominal pain and nausea.  Genitourinary:  Negative for difficulty urinating, frequency and vaginal pain.  Musculoskeletal:  Positive for arthralgias and back pain. Negative for gait problem.  Skin:  Negative for pallor and rash.  Neurological:  Negative for dizziness, tremors, weakness, numbness and headaches.  Psychiatric/Behavioral:  Negative for confusion and sleep disturbance.    Objective:  BP 132/70 (BP Location: Left Arm)   Pulse 97   Temp 98.5 F (36.9 C) (Oral)   Ht 5\' 5"  (1.651 m)   Wt 174 lb 9.6 oz (79.2 kg)   SpO2 96%   BMI 29.05 kg/m   BP Readings from Last 3 Encounters:  07/24/20 132/70  04/23/20 130/78  01/24/20 128/78    Wt Readings from Last 3 Encounters:  07/24/20 174 lb 9.6 oz (79.2 kg)  04/23/20 175 lb 6.4 oz (79.6 kg)  01/24/20 173 lb 12.8 oz (78.8 kg)    Physical Exam Constitutional:      General: She is not in acute distress.    Appearance: She is well-developed. She is obese.  HENT:     Head: Normocephalic.     Right Ear: External ear normal.     Left Ear: External ear normal.     Nose: Nose normal.  Eyes:     General:        Right eye: No discharge.        Left eye: No discharge.  Conjunctiva/sclera: Conjunctivae normal.     Pupils: Pupils are equal, round, and reactive to light.  Neck:     Thyroid: No thyromegaly.     Vascular: No JVD.     Trachea: No tracheal deviation.  Cardiovascular:     Rate and Rhythm: Normal rate and regular rhythm.     Heart sounds: Normal heart sounds.  Pulmonary:     Effort: No respiratory distress.     Breath sounds: No stridor. No wheezing.  Abdominal:     General: Bowel sounds are normal. There is no distension.     Palpations: Abdomen is soft. There is no mass.     Tenderness: There is no abdominal tenderness. There is no guarding or rebound.  Musculoskeletal:        General: Tenderness present.     Cervical back: Normal range of  motion and neck supple. No rigidity.  Lymphadenopathy:     Cervical: No cervical adenopathy.  Skin:    Findings: No erythema or rash.  Neurological:     Cranial Nerves: No cranial nerve deficit.     Motor: No abnormal muscle tone.     Coordination: Coordination normal.     Deep Tendon Reflexes: Reflexes normal.  Psychiatric:        Behavior: Behavior normal.        Thought Content: Thought content normal.        Judgment: Judgment normal.   Eryth mucosa - nares LS w/pain, R hip pain   Lab Results  Component Value Date   WBC 4.8 04/23/2020   HGB 13.2 04/23/2020   HCT 38.0 04/23/2020   PLT 254.0 04/23/2020   GLUCOSE 76 04/23/2020   CHOL 230 (A) 06/03/2020   TRIG 109 06/03/2020   HDL 48 06/03/2020   LDLDIRECT 185.7 01/19/2012   LDLCALC 162 06/03/2020   ALT 19 04/23/2020   AST 17 04/23/2020   NA 139 04/23/2020   K 4.0 04/23/2020   CL 104 04/23/2020   CREATININE 0.87 04/23/2020   BUN 14 04/23/2020   CO2 28 04/23/2020   TSH 2.40 04/23/2020   INR 1.0 07/24/2019   HGBA1C 5.6 06/03/2020    No results found.  Assessment & Plan:     Walker Kehr, MD

## 2020-07-24 NOTE — Assessment & Plan Note (Signed)
Norco prn  Potential benefits of a long term opioids use as well as potential risks (i.e. addiction risk, apnea etc) and complications (i.e. Somnolence, constipation and others) were explained to the patient and were aknowledged. 

## 2020-07-24 NOTE — Assessment & Plan Note (Signed)
Cont w/Xarelto °

## 2020-07-24 NOTE — Assessment & Plan Note (Addendum)
Worse Cont Allegra. Use Nasonex Medrol pack

## 2020-08-22 ENCOUNTER — Other Ambulatory Visit: Payer: Self-pay | Admitting: Endocrinology

## 2020-09-24 ENCOUNTER — Telehealth: Payer: Self-pay | Admitting: Internal Medicine

## 2020-09-24 DIAGNOSIS — E890 Postprocedural hypothyroidism: Secondary | ICD-10-CM

## 2020-09-24 NOTE — Telephone Encounter (Signed)
   Patient requesting refill for levothyroxine (SYNTHROID) 100 MCG tablet  [place], [place] AT [facility]

## 2020-09-25 ENCOUNTER — Other Ambulatory Visit: Payer: Self-pay | Admitting: Endocrinology

## 2020-09-25 DIAGNOSIS — E890 Postprocedural hypothyroidism: Secondary | ICD-10-CM

## 2020-09-25 MED ORDER — LEVOTHYROXINE SODIUM 100 MCG PO TABS: ORAL_TABLET | ORAL | 0 refills | Status: DC

## 2020-09-25 NOTE — Telephone Encounter (Signed)
Medication has been sent in for 30 day supply. I tried to call patient to schedule appointment. No answer.

## 2020-09-25 NOTE — Telephone Encounter (Signed)
Lab and follow up appointments made 10/07/20 and 10/10/20

## 2020-10-02 ENCOUNTER — Other Ambulatory Visit: Payer: Self-pay | Admitting: Endocrinology

## 2020-10-02 DIAGNOSIS — E890 Postprocedural hypothyroidism: Secondary | ICD-10-CM

## 2020-10-07 ENCOUNTER — Other Ambulatory Visit (INDEPENDENT_AMBULATORY_CARE_PROVIDER_SITE_OTHER): Payer: Medicare Other

## 2020-10-07 ENCOUNTER — Other Ambulatory Visit: Payer: Self-pay

## 2020-10-07 DIAGNOSIS — E890 Postprocedural hypothyroidism: Secondary | ICD-10-CM

## 2020-10-07 LAB — TSH: TSH: 1.14 u[IU]/mL (ref 0.35–5.50)

## 2020-10-07 LAB — T4, FREE: Free T4: 1.08 ng/dL (ref 0.60–1.60)

## 2020-10-10 ENCOUNTER — Encounter: Payer: Self-pay | Admitting: Endocrinology

## 2020-10-10 ENCOUNTER — Other Ambulatory Visit: Payer: Self-pay

## 2020-10-10 ENCOUNTER — Ambulatory Visit (INDEPENDENT_AMBULATORY_CARE_PROVIDER_SITE_OTHER): Payer: Medicare Other | Admitting: Endocrinology

## 2020-10-10 VITALS — BP 122/68 | HR 94 | Ht 65.5 in | Wt 175.4 lb

## 2020-10-10 DIAGNOSIS — E890 Postprocedural hypothyroidism: Secondary | ICD-10-CM | POA: Diagnosis not present

## 2020-10-10 NOTE — Progress Notes (Signed)
Patient ID: Ashley Larsen, female   DOB: Oct 18, 1950, 70 y.o.   MRN: [id number]    Reason for Appointment:  Post ablative hypothyroidism, followup   History of Present Illness:   She had hyperthyroidism first diagnosed in 01/2013 and treated with radioactive iodine on 05/03/13 with 12 mCi She became hypothyroid subsequently and in 6/15 with a free T4 level of 0.57 she was started on 112 mcg Synthroid  Recent history: Her dose was reduced to 88 mcg in 07/2013 because of relatively high free T4 of 1.4 and low TSH   When her TSH was high in 3/17 associated with fatigue for a couple of months she was changed to 88 g, 7-1/2 tablets weekly Subsequently was changed to 100 g daily and this dose has been continued.  She recently has not complained of any fatigue or lethargy  Also no shakiness or palpitations She has chronic cold intolerance No weight change lately  She has been regular with her levothyroxine daily before breakfast, takes it with water  Not on any vitamin supplements  Although her TSH was high normal previously since she was asymptomatic the dose was continued unchanged  Her TSH has been consistently normal and again this month   Wt Readings from Last 3 Encounters:  10/10/20 175 lb 6.4 oz (79.6 kg)  07/24/20 174 lb 9.6 oz (79.2 kg)  04/23/20 175 lb 6.4 oz (79.6 kg)    Labs:   Lab Results  Component Value Date   TSH 1.14 10/07/2020   TSH 2.40 04/23/2020   TSH 2.04 06/26/2019   FREET4 1.08 10/07/2020   FREET4 0.81 04/23/2020   FREET4 0.91 06/26/2019         Allergies as of 10/10/2020       Reactions   Metronidazole Other (See Comments)   REACTION: upset stomach   Oxycodone-acetaminophen Other (See Comments)   Pt tolerates Vicodin Caused Halucinations        Medication List        Accurate as of October 10, 2020 10:53 AM. If you have any questions, ask  your nurse or doctor.          amLODipine 5 MG tablet Commonly known as: NORVASC TAKE 1 TABLET(5 MG) BY MOUTH DAILY   fexofenadine 180 MG tablet Commonly known as: ALLEGRA Take 1 tablet (180 mg total) by mouth daily as needed for allergies.   flunisolide 25 MCG/ACT (0.025%) Soln Commonly known as: NASALIDE Place 2 sprays into the nose 2 (two) times daily.   fluticasone 50 MCG/ACT nasal spray Commonly known as: FLONASE Place 2 sprays into both nostrils daily. What changed:  when to take this reasons to take this   GenTeal Tears 0.1-0.2-0.3 % Soln Place 1 drop into both eyes daily as needed (Dry eye).   HYDROcodone-acetaminophen 5-325 MG tablet Commonly known as: NORCO/VICODIN Take 1 tablet by mouth every 8 (eight) hours as needed for severe pain.   levothyroxine 100 MCG tablet Commonly known as: SYNTHROID TAKE  1 TABLET BY MOUTH BEFORE BREAKFAST. PLEASE MAKE. FOLLOW-UP APPOINTMENT   losartan 100 MG tablet Commonly known as: COZAAR TAKE 1 TABLET(100 MG) BY MOUTH DAILY   methylPREDNISolone 4 MG Tbpk tablet Commonly known as: MEDROL DOSEPAK As directed   potassium chloride 10 MEQ tablet Commonly known as: KLOR-CON TAKE 1 TABLET(10 MEQ) BY MOUTH DAILY   Vitamin D3 50 MCG (2000 UT) capsule Take 1 capsule (2,000 Units total) by mouth daily.   Xarelto 20 MG Tabs tablet Generic drug: rivaroxaban TAKE 1 TABLET BY MOUTH EVERY DAY WITH SUPPER            Past Medical History:  Diagnosis Date   Allergic rhinitis    Allergy    Alopecia    Anemia    Arthritis    Asthma    does not use an inhaler   Bronchitis    Cataract    bilateral cateracts   COPD (chronic obstructive pulmonary disease) (HCC)    Diverticulosis    GERD (gastroesophageal reflux disease)    Hypertension    Hyperthyroidism    IBS (irritable bowel syndrome)    Insomnia    Ovarian cyst    Paresthesia    Pulmonary embolism (HCC)    Restless leg syndrome    Sickle cell trait ([place])      Past Surgical History:  Procedure Laterality Date   ABDOMINAL HYSTERECTOMY Right 07/24/2019   Procedure: HYSTERECTOMY ABDOMINAL, right salpingo oophorectomy;  Surgeon: Ashley Posey, MD;  Location: [facility];  Service: Gynecology;  Laterality: Right;   COLONOSCOPY     KNEE ARTHROSCOPY     Left   OOPHORECTOMY     Left   ROTATOR CUFF REPAIR     TUBAL LIGATION     UPPER GASTROINTESTINAL ENDOSCOPY     [place] teeth extraction      Family History  Problem Relation Age of Onset   Cancer Brother        brother - stomach ca   Stomach cancer Brother        dx age 31   Heart disease Father    Other Mother        sclerdermia   Colon cancer Sister        dx in her 21's   Hypertension Other    Cancer Sister 79       colon   Diabetes Sister    Colon polyps Sister        and Brother   Cancer Sister 68        colon   Kidney disease Brother    Diabetes Brother    Diabetes Brother    Diabetes Paternal Grandfather    Esophageal cancer Neg Hx    Rectal cancer Neg Hx    Breast cancer Neg Hx     Social History:  reports that she quit smoking about 5 months ago. Her smoking use included cigarettes. She has a 5.00 pack-year smoking history. She has never used smokeless tobacco. She reports that she does not drink alcohol and does not use drugs.  Allergies:  Allergies  Allergen Reactions   Metronidazole Other (See Comments)    REACTION: upset stomach   Oxycodone-Acetaminophen Other (See Comments)    Pt tolerates Vicodin  Caused Halucinations     Review of Systems:   HYPERTENSION: Has history of high blood pressure  treated with losartan 100 mg daily and amlodipine by her PCP Also on potassium supplements  BP Readings from Last 3 Encounters:  10/10/20 122/68  07/24/20 132/70  04/23/20 130/78   She has history of Graves' ophthalmopathy No recent symptoms    Examination:   BP 122/68   Pulse 94   Ht 5' 5.5" (1.664 m)   Wt 175 lb 6.4 oz (79.6 kg)   SpO2 98%   BMI  28.74 kg/m      Assessment/Plan:   Post-ablative hypothyroidism  She has post ablative hypothyroidism She has been on generic levothyroxine 100 mcg daily for nearly 5 years  She does not complain of any new fatigue She has taken her levothyroxine very consistently before eating in the morning No weight change TSH is consistently normal now  Follow-up annually   Ashley Larsen 10/10/2020, 10:53 AM   Note: This office note was prepared with Dragon voice recognition system technology. Any transcriptional errors that result from this process are unintentional.

## 2020-10-15 ENCOUNTER — Telehealth: Payer: Self-pay | Admitting: Internal Medicine

## 2020-10-15 NOTE — Telephone Encounter (Signed)
Patient requesting refill for HYDROcodone-acetaminophen (NORCO/VICODIN) 5-325 MG tablet   Ashley Larsen [id number] - [place], [place] AT [facility]

## 2020-10-16 MED ORDER — HYDROCODONE-ACETAMINOPHEN 5-325 MG PO TABS: 1.0000 | ORAL_TABLET | Freq: Three times a day (TID) | ORAL | 0 refills | Status: DC | PRN

## 2020-10-16 NOTE — Telephone Encounter (Signed)
OK. Thx

## 2020-10-27 ENCOUNTER — Encounter: Payer: Self-pay | Admitting: Internal Medicine

## 2020-10-27 ENCOUNTER — Other Ambulatory Visit: Payer: Self-pay

## 2020-10-27 ENCOUNTER — Ambulatory Visit (INDEPENDENT_AMBULATORY_CARE_PROVIDER_SITE_OTHER): Payer: Medicare Other | Admitting: Internal Medicine

## 2020-10-27 VITALS — BP 130/72 | HR 83 | Temp 98.3°F | Ht 65.5 in | Wt 175.2 lb

## 2020-10-27 DIAGNOSIS — M79604 Pain in right leg: Secondary | ICD-10-CM | POA: Diagnosis not present

## 2020-10-27 DIAGNOSIS — Z86711 Personal history of pulmonary embolism: Secondary | ICD-10-CM | POA: Diagnosis not present

## 2020-10-27 DIAGNOSIS — E559 Vitamin D deficiency, unspecified: Secondary | ICD-10-CM

## 2020-10-27 DIAGNOSIS — Z23 Encounter for immunization: Secondary | ICD-10-CM

## 2020-10-27 DIAGNOSIS — G8929 Other chronic pain: Secondary | ICD-10-CM

## 2020-10-27 DIAGNOSIS — M5440 Lumbago with sciatica, unspecified side: Secondary | ICD-10-CM

## 2020-10-27 DIAGNOSIS — E890 Postprocedural hypothyroidism: Secondary | ICD-10-CM | POA: Diagnosis not present

## 2020-10-27 DIAGNOSIS — M79605 Pain in left leg: Secondary | ICD-10-CM | POA: Diagnosis not present

## 2020-10-27 NOTE — Assessment & Plan Note (Signed)
On Xarelto 

## 2020-10-27 NOTE — Progress Notes (Signed)
Subjective:  Patient ID: Ashley Larsen, female    DOB: 07-15-1950  Age: 70 y.o. 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     Cholecalciferol (VITAMIN D3) 50 MCG (2000 UT) capsule Take 1 capsule (2,000 Units total) by mouth daily. 100 capsule 3   fexofenadine (ALLEGRA) 180 MG tablet Take 1 tablet (180 mg total) by mouth daily as needed for allergies. 90 tablet 3   fluticasone (FLONASE) 50 MCG/ACT nasal spray Place 2 sprays into both nostrils daily. (Patient taking differently: Place 2 sprays into both nostrils daily as needed for allergies or rhinitis.) 16 g 6   HYDROcodone-acetaminophen (NORCO/VICODIN) 5-325 MG tablet Take 1 tablet by mouth every 8 (eight) hours as needed for severe pain. 90 tablet 0   levothyroxine (SYNTHROID) 100 MCG tablet TAKE 1 TABLET BY MOUTH BEFORE BREAKFAST. PLEASE MAKE. FOLLOW-UP APPOINTMENT 90 tablet 0   losartan (COZAAR) 100 MG tablet TAKE 1 TABLET(100 MG) BY MOUTH DAILY 90 tablet 3   methylPREDNISolone (MEDROL DOSEPAK) 4 MG TBPK tablet As directed 21 tablet 0   potassium chloride (KLOR-CON) 10 MEQ tablet TAKE 1 TABLET(10 MEQ) BY MOUTH DAILY 90 tablet 3   XARELTO 20 MG TABS tablet TAKE 1 TABLET BY MOUTH EVERY DAY WITH SUPPER 90 tablet 3   flunisolide (NASALIDE) 25 MCG/ACT (0.025%) SOLN Place 2 sprays into the nose 2 (two) times daily. (Patient not taking: No sig reported) 25 mL 5   No facility-administered medications prior to visit.    ROS: Review of Systems  Constitutional:  Negative for activity change, appetite change, chills, fatigue and unexpected weight change.  HENT:  Negative for congestion, mouth sores  and sinus pressure.   Eyes:  Negative for visual disturbance.  Respiratory:  Positive for wheezing. Negative for cough, chest tightness and shortness of breath.   Cardiovascular:  Negative for palpitations.  Gastrointestinal:  Negative for abdominal pain and nausea.  Genitourinary:  Negative for difficulty urinating, frequency and vaginal pain.  Musculoskeletal:  Positive for arthralgias, back pain and gait problem.  Skin:  Negative for pallor and rash.  Neurological:  Negative for dizziness, tremors, weakness, numbness and headaches.  Psychiatric/Behavioral:  Negative for confusion, sleep disturbance and suicidal ideas.    Objective:  BP 130/72 (BP Location: Left Arm)   Pulse 83   Temp 98.3 F (36.8 C) (Oral)   Ht 5' 5.5" (1.664 m)   Wt 175 lb 3.2 oz (79.5 kg)   SpO2 97%   BMI 28.71 kg/m   BP Readings from Last 3 Encounters:  10/27/20 130/72  10/10/20 122/68  07/24/20 132/70    Wt Readings from Last 3 Encounters:  10/27/20 175 lb 3.2 oz (79.5 kg)  10/10/20 175 lb 6.4 oz (79.6 kg)  07/24/20 174 lb 9.6 oz (79.2 kg)    Physical Exam Constitutional:      General: She is not in acute distress.    Appearance: She is well-developed. She is obese.  HENT:     Head: Normocephalic.     Right Ear: External ear normal.     Left Ear: External ear normal.  Nose: Nose normal.  Eyes:     General:        Right eye: No discharge.        Left eye: No discharge.     Conjunctiva/sclera: Conjunctivae normal.     Pupils: Pupils are equal, round, and reactive to light.  Neck:     Thyroid: No thyromegaly.     Vascular: No JVD.     Trachea: No tracheal deviation.  Cardiovascular:     Rate and Rhythm: Normal rate and regular rhythm.     Heart sounds: Normal heart sounds.  Pulmonary:     Effort: No respiratory distress.     Breath sounds: No stridor. No wheezing.  Abdominal:     General: Bowel sounds are normal. There is no distension.     Palpations: Abdomen is soft. There is no  mass.     Tenderness: There is no abdominal tenderness. There is no guarding or rebound.  Musculoskeletal:        General: No tenderness.     Cervical back: Normal range of motion and neck supple. No rigidity.  Lymphadenopathy:     Cervical: No cervical adenopathy.  Skin:    Findings: No erythema or rash.  Neurological:     Mental Status: She is oriented to person, place, and time.     Cranial Nerves: No cranial nerve deficit.     Motor: No abnormal muscle tone.     Coordination: Coordination normal.     Deep Tendon Reflexes: Reflexes normal.  Psychiatric:        Behavior: Behavior normal.        Thought Content: Thought content normal.        Judgment: Judgment normal.    Lab Results  Component Value Date   WBC 4.8 04/23/2020   HGB 13.2 04/23/2020   HCT 38.0 04/23/2020   PLT 254.0 04/23/2020   GLUCOSE 76 04/23/2020   CHOL 230 (A) 06/03/2020   TRIG 109 06/03/2020   HDL 48 06/03/2020   LDLDIRECT 185.7 01/19/2012   LDLCALC 162 06/03/2020   ALT 19 04/23/2020   AST 17 04/23/2020   NA 139 04/23/2020   K 4.0 04/23/2020   CL 104 04/23/2020   CREATININE 0.87 04/23/2020   BUN 14 04/23/2020   CO2 28 04/23/2020   TSH 1.14 10/07/2020   INR 1.0 07/24/2019   HGBA1C 5.6 06/03/2020    No results found.  Assessment & Plan:   There are no diagnoses linked to this encounter.   No orders of the defined types were placed in this encounter.    Follow-up: No follow-ups on file.  Walker Kehr, MD

## 2020-10-27 NOTE — Assessment & Plan Note (Signed)
B knee OA Norco prn  Potential benefits of a long term opioids use as well as potential risks (i.e. addiction risk, apnea etc) and complications (i.e. Somnolence, constipation and others) were explained to the patient and were aknowledged.

## 2020-10-27 NOTE — Assessment & Plan Note (Signed)
Norco prn  Potential benefits of a long term opioids use as well as potential risks (i.e. addiction risk, apnea etc) and complications (i.e. Somnolence, constipation and others) were explained to the patient and were aknowledged. 

## 2020-10-27 NOTE — Assessment & Plan Note (Signed)
On Levothroid 

## 2020-10-27 NOTE — Assessment & Plan Note (Signed)
On Vit D 

## 2020-12-16 LAB — HEMOGLOBIN A1C: Hemoglobin A1C: 5.6

## 2020-12-16 LAB — LIPID PANEL
Cholesterol: 222 — AB (ref 0–200)
LDL Cholesterol: 158
LDl/HDL Ratio: 46
Triglycerides: 103 (ref 40–160)

## 2020-12-23 ENCOUNTER — Encounter: Payer: Self-pay | Admitting: Internal Medicine

## 2020-12-25 ENCOUNTER — Other Ambulatory Visit: Payer: Self-pay | Admitting: Endocrinology

## 2020-12-25 DIAGNOSIS — E890 Postprocedural hypothyroidism: Secondary | ICD-10-CM

## 2020-12-30 ENCOUNTER — Other Ambulatory Visit: Payer: Self-pay | Admitting: Internal Medicine

## 2021-01-14 ENCOUNTER — Telehealth: Payer: Self-pay | Admitting: Internal Medicine

## 2021-01-14 NOTE — Telephone Encounter (Signed)
1.Medication Requested: HYDROcodone-acetaminophen (NORCO/VICODIN) 5-325 MG tablet  2. Pharmacy (Name, Street, [facility]): [facility] [place], [address] [facility]  Phone:  [phone] Fax:  [phone]   3. On Med List: yes  4. Last Visit with PCP: 09.12.22  5. Next visit date with PCP: 12.14.22   Agent: Please be advised that RX refills may take up to 3 business days. We ask that you follow-up with your pharmacy.

## 2021-01-16 MED ORDER — HYDROCODONE-ACETAMINOPHEN 5-325 MG PO TABS: 1.0000 | ORAL_TABLET | Freq: Three times a day (TID) | ORAL | 0 refills | Status: DC | PRN

## 2021-01-16 NOTE — Telephone Encounter (Signed)
Okay.  Thanks.

## 2021-01-28 ENCOUNTER — Other Ambulatory Visit: Payer: Self-pay

## 2021-01-28 ENCOUNTER — Encounter: Payer: Self-pay | Admitting: Internal Medicine

## 2021-01-28 ENCOUNTER — Ambulatory Visit (INDEPENDENT_AMBULATORY_CARE_PROVIDER_SITE_OTHER): Payer: Medicare Other | Admitting: Internal Medicine

## 2021-01-28 DIAGNOSIS — I10 Essential (primary) hypertension: Secondary | ICD-10-CM

## 2021-01-28 DIAGNOSIS — E559 Vitamin D deficiency, unspecified: Secondary | ICD-10-CM

## 2021-01-28 DIAGNOSIS — M5440 Lumbago with sciatica, unspecified side: Secondary | ICD-10-CM | POA: Diagnosis not present

## 2021-01-28 DIAGNOSIS — G8929 Other chronic pain: Secondary | ICD-10-CM | POA: Diagnosis not present

## 2021-01-28 DIAGNOSIS — J449 Chronic obstructive pulmonary disease, unspecified: Secondary | ICD-10-CM | POA: Diagnosis not present

## 2021-01-28 MED ORDER — RIVAROXABAN 20 MG PO TABS: ORAL_TABLET | ORAL | 3 refills | Status: DC

## 2021-01-28 NOTE — Assessment & Plan Note (Signed)
Norco prn  Potential benefits of a long term opioids use as well as potential risks (i.e. addiction risk, apnea etc) and complications (i.e. Somnolence, constipation and others) were explained to the patient and were aknowledged. Handicapped form for DMV filled out

## 2021-01-28 NOTE — Assessment & Plan Note (Signed)
Cont on Breo Prom-cod (not to take w/Norco)

## 2021-01-28 NOTE — Assessment & Plan Note (Signed)
Cont w/Vit D 

## 2021-01-28 NOTE — Progress Notes (Signed)
Subjective:  Patient ID: Ashley Larsen, female    DOB: 25-Feb-1950  Age: 70 y.o. MRN: [id number]  CC: Follow-up (3 month f/u)   HPI Janny Crute presents for OA, LBP, HTN, anticoagulation f/u  Outpatient Medications Prior to Visit  Medication Sig Dispense Refill   amLODipine (NORVASC) 5 MG tablet TAKE 1 TABLET(5 MG) BY MOUTH DAILY 90 tablet 2   Artificial Tear Solution (GENTEAL TEARS) 0.1-0.2-0.3 % SOLN Place 1 drop into both eyes daily as needed (Dry eye).     Cholecalciferol (VITAMIN D3) 50 MCG (2000 UT) capsule Take 1 capsule (2,000 Units total) by mouth daily. 100 capsule 3   fexofenadine (ALLEGRA) 180 MG tablet Take 1 tablet (180 mg total) by mouth daily as needed for allergies. 90 tablet 3   fluticasone (FLONASE) 50 MCG/ACT nasal spray Place 2 sprays into both nostrils daily. (Patient taking differently: Place 2 sprays into both nostrils daily as needed for allergies or rhinitis.) 16 g 6   HYDROcodone-acetaminophen (NORCO/VICODIN) 5-325 MG tablet Take 1 tablet by mouth every 8 (eight) hours as needed for severe pain. 90 tablet 0   levothyroxine (SYNTHROID) 100 MCG tablet TAKE 1 TABLET BY MOUTH BEFORE BREAKFAST. PLEASE MAKE. FOLLOW-UP APPOINTMENT 90 tablet 0   losartan (COZAAR) 100 MG tablet TAKE 1 TABLET(100 MG) BY MOUTH DAILY 90 tablet 3   potassium chloride (KLOR-CON) 10 MEQ tablet TAKE 1 TABLET(10 MEQ) BY MOUTH DAILY 90 tablet 3   XARELTO 20 MG TABS tablet TAKE 1 TABLET BY MOUTH EVERY DAY WITH SUPPER 90 tablet 3   flunisolide (NASALIDE) 25 MCG/ACT (0.025%) SOLN Place 2 sprays into the nose 2 (two) times daily. (Patient not taking: Reported on 10/10/2020) 25 mL 5   methylPREDNISolone (MEDROL DOSEPAK) 4 MG TBPK tablet As directed (Patient not taking: Reported on 01/28/2021) 21 tablet 0   No facility-administered medications prior to visit.    ROS: Review of Systems  Constitutional:  Negative for activity change, appetite change, chills, fatigue and unexpected weight change.   HENT:  Negative for congestion, mouth sores and sinus pressure.   Eyes:  Negative for visual disturbance.  Respiratory:  Negative for cough and chest tightness.   Gastrointestinal:  Negative for abdominal pain and nausea.  Genitourinary:  Negative for difficulty urinating, frequency and vaginal pain.  Musculoskeletal:  Positive for arthralgias and back pain. Negative for gait problem.  Skin:  Negative for pallor and rash.  Neurological:  Negative for dizziness, tremors, weakness, numbness and headaches.  Psychiatric/Behavioral:  Negative for confusion and sleep disturbance. The patient is not nervous/anxious.    Objective:  BP 122/70 (BP Location: Left Arm)    Pulse 99    Temp 98.1 F (36.7 C) (Oral)    Ht 5' 5.5" (1.664 m)    Wt 174 lb 6.4 oz (79.1 kg)    SpO2 97%    BMI 28.58 kg/m   BP Readings from Last 3 Encounters:  01/28/21 122/70  10/27/20 130/72  10/10/20 122/68    Wt Readings from Last 3 Encounters:  01/28/21 174 lb 6.4 oz (79.1 kg)  10/27/20 175 lb 3.2 oz (79.5 kg)  10/10/20 175 lb 6.4 oz (79.6 kg)    Physical Exam Constitutional:      General: She is not in acute distress.    Appearance: She is well-developed.  HENT:     Head: Normocephalic.     Right Ear: External ear normal.     Left Ear: External ear normal.     Nose: Nose  normal.  Eyes:     General:        Right eye: No discharge.        Left eye: No discharge.     Conjunctiva/sclera: Conjunctivae normal.     Pupils: Pupils are equal, round, and reactive to light.  Neck:     Thyroid: No thyromegaly.     Vascular: No JVD.     Trachea: No tracheal deviation.  Cardiovascular:     Rate and Rhythm: Normal rate and regular rhythm.     Heart sounds: Normal heart sounds.  Pulmonary:     Effort: No respiratory distress.     Breath sounds: No stridor. No wheezing.  Abdominal:     General: Bowel sounds are normal. There is no distension.     Palpations: Abdomen is soft. There is no mass.     Tenderness:  There is no abdominal tenderness. There is no guarding or rebound.  Musculoskeletal:        General: Tenderness present.     Cervical back: Normal range of motion and neck supple. No rigidity.  Lymphadenopathy:     Cervical: No cervical adenopathy.  Skin:    Findings: No erythema or rash.  Neurological:     Cranial Nerves: No cranial nerve deficit.     Motor: No abnormal muscle tone.     Coordination: Coordination normal.     Deep Tendon Reflexes: Reflexes normal.  Psychiatric:        Behavior: Behavior normal.        Thought Content: Thought content normal.        Judgment: Judgment normal.    Lab Results  Component Value Date   WBC 4.8 04/23/2020   HGB 13.2 04/23/2020   HCT 38.0 04/23/2020   PLT 254.0 04/23/2020   GLUCOSE 76 04/23/2020   CHOL 222 (A) 12/16/2020   TRIG 103 12/16/2020   HDL 48 06/03/2020   LDLDIRECT 185.7 01/19/2012   LDLCALC 158 12/16/2020   ALT 19 04/23/2020   AST 17 04/23/2020   NA 139 04/23/2020   K 4.0 04/23/2020   CL 104 04/23/2020   CREATININE 0.87 04/23/2020   BUN 14 04/23/2020   CO2 28 04/23/2020   TSH 1.14 10/07/2020   INR 1.0 07/24/2019   HGBA1C 5.6 12/16/2020    No results found.  Assessment & Plan:   Problem List Items Addressed This Visit     COPD with chronic bronchitis ([place])    Cont on Breo Prom-cod (not to take w/Norco)      Essential hypertension    Cont on Losartan, Norvasc      Relevant Medications   rivaroxaban (XARELTO) 20 MG TABS tablet   Low back pain    Norco prn  Potential benefits of a long term opioids use as well as potential risks (i.e. addiction risk, apnea etc) and complications (i.e. Somnolence, constipation and others) were explained to the patient and were aknowledged. Handicapped form for DMV filled out      Vitamin D deficiency    Cont w/Vit D         Meds ordered this encounter  Medications   rivaroxaban (XARELTO) 20 MG TABS tablet    Sig: TAKE 1 TABLET BY MOUTH EVERY DAY WITH SUPPER     Dispense:  90 tablet    Refill:  3       Follow-up: No follow-ups on file.  Walker Kehr, MD

## 2021-01-28 NOTE — Assessment & Plan Note (Signed)
Cont on Losartan, Norvasc 

## 2021-02-11 ENCOUNTER — Other Ambulatory Visit: Payer: Self-pay | Admitting: Internal Medicine

## 2021-03-09 ENCOUNTER — Other Ambulatory Visit: Payer: Self-pay | Admitting: Internal Medicine

## 2021-03-25 ENCOUNTER — Telehealth: Payer: Self-pay | Admitting: Internal Medicine

## 2021-03-25 NOTE — Telephone Encounter (Signed)
Pt inquiring if provider has any xarelto 20 mg samples  Please advise

## 2021-03-26 NOTE — Telephone Encounter (Signed)
Pt checking status of response, informed pt her mssg has been routed to the provider and the 24-48 hr turn around time

## 2021-03-26 NOTE — Telephone Encounter (Signed)
Okay to give if we have it.  Thanks

## 2021-04-01 ENCOUNTER — Other Ambulatory Visit: Payer: Self-pay | Admitting: Endocrinology

## 2021-04-01 DIAGNOSIS — E890 Postprocedural hypothyroidism: Secondary | ICD-10-CM

## 2021-04-14 ENCOUNTER — Telehealth: Payer: Self-pay

## 2021-04-14 MED ORDER — HYDROCODONE-ACETAMINOPHEN 5-325 MG PO TABS: 1.0000 | ORAL_TABLET | Freq: Three times a day (TID) | ORAL | 0 refills | Status: DC | PRN

## 2021-04-14 NOTE — Telephone Encounter (Signed)
Okay.  Thanks.

## 2021-04-14 NOTE — Telephone Encounter (Signed)
Pt is requesting refill on: HYDROcodone-acetaminophen (NORCO/VICODIN) 5-325 MG tablet  Pharmacy: [facility] [place], [place] [address] AT [place]  LOV 01/28/21 ROV 05/06/21

## 2021-05-06 ENCOUNTER — Encounter: Payer: Self-pay | Admitting: Internal Medicine

## 2021-05-06 ENCOUNTER — Other Ambulatory Visit: Payer: Self-pay

## 2021-05-06 ENCOUNTER — Ambulatory Visit (INDEPENDENT_AMBULATORY_CARE_PROVIDER_SITE_OTHER): Payer: Medicare Other | Admitting: Internal Medicine

## 2021-05-06 VITALS — BP 122/76 | HR 85 | Temp 98.2°F | Ht 65.5 in | Wt 180.0 lb

## 2021-05-06 DIAGNOSIS — G8929 Other chronic pain: Secondary | ICD-10-CM | POA: Diagnosis not present

## 2021-05-06 DIAGNOSIS — M5440 Lumbago with sciatica, unspecified side: Secondary | ICD-10-CM

## 2021-05-06 DIAGNOSIS — Z7901 Long term (current) use of anticoagulants: Secondary | ICD-10-CM

## 2021-05-06 DIAGNOSIS — E876 Hypokalemia: Secondary | ICD-10-CM | POA: Diagnosis not present

## 2021-05-06 DIAGNOSIS — I10 Essential (primary) hypertension: Secondary | ICD-10-CM | POA: Diagnosis not present

## 2021-05-06 DIAGNOSIS — E785 Hyperlipidemia, unspecified: Secondary | ICD-10-CM | POA: Diagnosis not present

## 2021-05-06 DIAGNOSIS — M5441 Lumbago with sciatica, right side: Secondary | ICD-10-CM

## 2021-05-06 LAB — CBC WITH DIFFERENTIAL/PLATELET
Basophils Absolute: 0 10*3/uL (ref 0.0–0.1)
Basophils Relative: 0.4 % (ref 0.0–3.0)
Eosinophils Absolute: 0.1 10*3/uL (ref 0.0–0.7)
Eosinophils Relative: 1.9 % (ref 0.0–5.0)
HCT: 38 % (ref 36.0–46.0)
Hemoglobin: 12.8 g/dL (ref 12.0–15.0)
Lymphocytes Relative: 28 % (ref 12.0–46.0)
Lymphs Abs: 1.4 10*3/uL (ref 0.7–4.0)
MCHC: 33.7 g/dL (ref 30.0–36.0)
MCV: 89.8 fl (ref 78.0–100.0)
Monocytes Absolute: 0.5 10*3/uL (ref 0.1–1.0)
Monocytes Relative: 9.4 % (ref 3.0–12.0)
Neutro Abs: 3.1 10*3/uL (ref 1.4–7.7)
Neutrophils Relative %: 60.3 % (ref 43.0–77.0)
Platelets: 260 10*3/uL (ref 150.0–400.0)
RBC: 4.23 Mil/uL (ref 3.87–5.11)
RDW: 13.3 % (ref 11.5–15.5)
WBC: 5.1 10*3/uL (ref 4.0–10.5)

## 2021-05-06 LAB — COMPREHENSIVE METABOLIC PANEL
ALT: 20 U/L (ref 0–35)
AST: 20 U/L (ref 0–37)
Albumin: 4.6 g/dL (ref 3.5–5.2)
Alkaline Phosphatase: 70 U/L (ref 39–117)
BUN: 10 mg/dL (ref 6–23)
CO2: 30 mEq/L (ref 19–32)
Calcium: 9.6 mg/dL (ref 8.4–10.5)
Chloride: 107 mEq/L (ref 96–112)
Creatinine, Ser: 0.82 mg/dL (ref 0.40–1.20)
GFR: 73.32 mL/min (ref >60.00)
Glucose, Bld: 86 mg/dL (ref 70–99)
Potassium: 3.8 mEq/L (ref 3.5–5.1)
Sodium: 142 mEq/L (ref 135–145)
Total Bilirubin: 0.5 mg/dL (ref 0.2–1.2)
Total Protein: 7.7 g/dL (ref 6.0–8.3)

## 2021-05-06 LAB — TSH: TSH: 1.07 u[IU]/mL (ref 0.35–5.50)

## 2021-05-06 LAB — T4, FREE: Free T4: 1.02 ng/dL (ref 0.60–1.60)

## 2021-05-06 NOTE — Assessment & Plan Note (Signed)
Chronic ?Cont on Norco prn ? Potential benefits of a long term opioids use as well as potential risks (i.e. addiction risk, apnea etc) and complications (i.e. Somnolence, constipation and others) were explained to the patient and were aknowledged. ?

## 2021-05-06 NOTE — Assessment & Plan Note (Addendum)
Chronic ?On Xarelto - $$$.  We will try to get help with Xarelto coverage ?Call 888-XARELTO [phone]) so we can let you know if and when you become eligible for [facility], as well as provide you with any other program updates. ?

## 2021-05-06 NOTE — Patient Instructions (Signed)
Call 888-XARELTO [phone]) so we can let you know if and when you become eligible for [facility] [place], as well as provide you with any other program updates. ?

## 2021-05-06 NOTE — Progress Notes (Signed)
? ?Subjective:  ?Patient ID: Ashley Larsen, female    DOB: 04/13/1950  Age: 71 y.o. MRN: [id number] ? ?CC: Follow-up ? ? ?HPI ?Ashley Larsen presents for anticoagulation, HTN, OA f/u ? ?Outpatient Medications Prior to Visit  ?Medication Sig Dispense Refill  ? amLODipine (NORVASC) 5 MG tablet TAKE 1 TABLET(5 MG) BY MOUTH DAILY 90 tablet 2  ? Artificial Tear Solution (GENTEAL TEARS) 0.1-0.2-0.3 % SOLN Place 1 drop into both eyes daily as needed (Dry eye).    ? Cholecalciferol (VITAMIN D3) 50 MCG (2000 UT) capsule Take 1 capsule (2,000 Units total) by mouth daily. 100 capsule 3  ? fexofenadine (ALLEGRA) 180 MG tablet Take 1 tablet (180 mg total) by mouth daily as needed for allergies. 90 tablet 3  ? fluticasone (FLONASE) 50 MCG/ACT nasal spray Place 2 sprays into both nostrils daily. (Patient taking differently: Place 2 sprays into both nostrils daily as needed for allergies or rhinitis.) 16 g 6  ? HYDROcodone-acetaminophen (NORCO/VICODIN) 5-325 MG tablet Take 1 tablet by mouth every 8 (eight) hours as needed for severe pain. 90 tablet 0  ? levothyroxine (SYNTHROID) 100 MCG tablet TAKE 1 TABLET BY MOUTH BEFORE BREAKFAST 90 tablet 2  ? losartan (COZAAR) 100 MG tablet TAKE 1 TABLET(100 MG) BY MOUTH DAILY 90 tablet 3  ? potassium chloride (KLOR-CON) 10 MEQ tablet TAKE 1 TABLET(10 MEQ) BY MOUTH DAILY 90 tablet 0  ? rivaroxaban (XARELTO) 20 MG TABS tablet TAKE 1 TABLET BY MOUTH EVERY DAY WITH SUPPER 90 tablet 3  ? ?No facility-administered medications prior to visit.  ? ? ?ROS: ?Review of Systems  ?Constitutional:  Negative for activity change, appetite change, chills, fatigue and unexpected weight change.  ?HENT:  Negative for congestion, mouth sores and sinus pressure.   ?Eyes:  Negative for visual disturbance.  ?Respiratory:  Negative for cough and chest tightness.   ?Gastrointestinal:  Negative for abdominal pain and nausea.  ?Genitourinary:  Negative for difficulty urinating, frequency and vaginal pain.   ?Musculoskeletal:  Positive for back pain. Negative for gait problem.  ?Skin:  Negative for pallor, rash and wound.  ?Neurological:  Negative for dizziness, tremors, weakness, numbness and headaches.  ?Psychiatric/Behavioral:  Negative for confusion and sleep disturbance.   ? ?Objective:  ?BP 122/76   Pulse 85   Temp 98.2 ?F (36.8 ?C) (Oral)   Ht 5' 5.5" (1.664 m)   Wt 180 lb (81.6 kg)   SpO2 96%   BMI 29.50 kg/m?  ? ?BP Readings from Last 3 Encounters:  ?05/06/21 122/76  ?01/28/21 122/70  ?10/27/20 130/72  ? ? ?Wt Readings from Last 3 Encounters:  ?05/06/21 180 lb (81.6 kg)  ?01/28/21 174 lb 6.4 oz (79.1 kg)  ?10/27/20 175 lb 3.2 oz (79.5 kg)  ? ? ?Physical Exam ?Constitutional:   ?   General: She is not in acute distress. ?   Appearance: Normal appearance. She is well-developed.  ?HENT:  ?   Head: Normocephalic.  ?   Right Ear: External ear normal.  ?   Left Ear: External ear normal.  ?   Nose: Nose normal.  ?Eyes:  ?   General:     ?   Right eye: No discharge.     ?   Left eye: No discharge.  ?   Conjunctiva/sclera: Conjunctivae normal.  ?   Pupils: Pupils are equal, round, and reactive to light.  ?Neck:  ?   Thyroid: No thyromegaly.  ?   Vascular: No JVD.  ?   Trachea: No  tracheal deviation.  ?Cardiovascular:  ?   Rate and Rhythm: Normal rate and regular rhythm.  ?   Heart sounds: Normal heart sounds.  ?Pulmonary:  ?   Effort: No respiratory distress.  ?   Breath sounds: No stridor. No wheezing.  ?Abdominal:  ?   General: Bowel sounds are normal. There is no distension.  ?   Palpations: Abdomen is soft. There is no mass.  ?   Tenderness: There is no abdominal tenderness. There is no guarding or rebound.  ?Musculoskeletal:     ?   General: Tenderness present.  ?   Cervical back: Normal range of motion and neck supple. No rigidity.  ?Lymphadenopathy:  ?   Cervical: No cervical adenopathy.  ?Skin: ?   Findings: No erythema or rash.  ?Neurological:  ?   Cranial Nerves: No cranial nerve deficit.  ?   Motor: No  abnormal muscle tone.  ?   Coordination: Coordination normal.  ?   Deep Tendon Reflexes: Reflexes normal.  ?Psychiatric:     ?   Behavior: Behavior normal.     ?   Thought Content: Thought content normal.     ?   Judgment: Judgment normal.  ?LS w/pain ? ?Lab Results  ?Component Value Date  ? WBC 5.1 05/06/2021  ? HGB 12.8 05/06/2021  ? HCT 38.0 05/06/2021  ? PLT 260.0 05/06/2021  ? GLUCOSE 86 05/06/2021  ? CHOL 222 (A) 12/16/2020  ? TRIG 103 12/16/2020  ? HDL 48 06/03/2020  ? LDLDIRECT 185.7 01/19/2012  ? [place] 158 12/16/2020  ? ALT 20 05/06/2021  ? AST 20 05/06/2021  ? NA 142 05/06/2021  ? K 3.8 05/06/2021  ? CL 107 05/06/2021  ? CREATININE 0.82 05/06/2021  ? BUN 10 05/06/2021  ? CO2 30 05/06/2021  ? TSH 1.07 05/06/2021  ? INR 1.0 07/24/2019  ? HGBA1C 5.6 12/16/2020  ? ? ?No results found. ? ?Assessment & Plan:  ? ?Problem List Items Addressed This Visit   ? ? HYPOKALEMIA  ?  Will check CMET ?  ?  ? Relevant Orders  ? CBC with Differential/Platelet (Completed)  ? TSH (Completed)  ? T4, free (Completed)  ? Comprehensive metabolic panel (Completed)  ? Essential hypertension  ?  Stable ?Cont on Losartan, Norvasc ?  ?  ? Dyslipidemia - Primary  ? Relevant Orders  ? TSH (Completed)  ? T4, free (Completed)  ? Comprehensive metabolic panel (Completed)  ? Low back pain  ?  Chronic ?Cont on Norco prn ? Potential benefits of a long term opioids use as well as potential risks (i.e. addiction risk, apnea etc) and complications (i.e. Somnolence, constipation and others) were explained to the patient and were aknowledged. ?  ?  ? Anticoagulated  ?  Chronic ?On Xarelto - $$$.  We will try to get help with Xarelto coverage ?Call 888-XARELTO [phone]) so we can let you know if and when you become eligible for [facility], as well as provide you with any other program updates. ?  ?  ? Relevant Orders  ? CBC with Differential/Platelet (Completed)  ? TSH (Completed)  ? T4, free (Completed)  ? Comprehensive metabolic panel  (Completed)  ?  ? ? ?No orders of the defined types were placed in this encounter. ?  ? ? ?Follow-up: Return in about 3 months (around 08/06/2021) for a follow-up visit. ? ?Walker Kehr, MD ?

## 2021-05-06 NOTE — Assessment & Plan Note (Signed)
Will check CMET ?

## 2021-05-06 NOTE — Assessment & Plan Note (Signed)
Stable ?Cont on Losartan, Norvasc ?

## 2021-06-11 LAB — LIPID PANEL
Cholesterol: 249 — AB (ref 0–200)
LDL Cholesterol: 179
LDl/HDL Ratio: 51
Triglycerides: 109 (ref 40–160)

## 2021-06-11 LAB — BASIC METABOLIC PANEL: Glucose: 90

## 2021-06-11 LAB — HEMOGLOBIN A1C: Hemoglobin A1C: 5.6

## 2021-06-15 ENCOUNTER — Other Ambulatory Visit: Payer: Self-pay | Admitting: Internal Medicine

## 2021-06-16 ENCOUNTER — Ambulatory Visit (INDEPENDENT_AMBULATORY_CARE_PROVIDER_SITE_OTHER): Payer: Medicare Other | Admitting: Podiatry

## 2021-06-16 ENCOUNTER — Encounter: Payer: Self-pay | Admitting: Podiatry

## 2021-06-16 DIAGNOSIS — M2012 Hallux valgus (acquired), left foot: Secondary | ICD-10-CM | POA: Diagnosis not present

## 2021-06-16 DIAGNOSIS — B351 Tinea unguium: Secondary | ICD-10-CM | POA: Diagnosis not present

## 2021-06-16 DIAGNOSIS — Q828 Other specified congenital malformations of skin: Secondary | ICD-10-CM | POA: Diagnosis not present

## 2021-06-16 DIAGNOSIS — M2042 Other hammer toe(s) (acquired), left foot: Secondary | ICD-10-CM

## 2021-06-16 DIAGNOSIS — D689 Coagulation defect, unspecified: Secondary | ICD-10-CM | POA: Diagnosis not present

## 2021-06-16 DIAGNOSIS — M2011 Hallux valgus (acquired), right foot: Secondary | ICD-10-CM

## 2021-06-16 DIAGNOSIS — M79675 Pain in left toe(s): Secondary | ICD-10-CM

## 2021-06-16 DIAGNOSIS — M79674 Pain in right toe(s): Secondary | ICD-10-CM | POA: Diagnosis not present

## 2021-06-16 DIAGNOSIS — M2041 Other hammer toe(s) (acquired), right foot: Secondary | ICD-10-CM

## 2021-06-19 ENCOUNTER — Encounter: Payer: Self-pay | Admitting: Internal Medicine
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
## 2021-07-08 ENCOUNTER — Telehealth: Payer: Self-pay | Admitting: Internal Medicine

## 2021-07-08 NOTE — Telephone Encounter (Signed)
1.Medication Requested: HYDROcodone-acetaminophen (NORCO/VICODIN) 5-325 MG tablet 2. Pharmacy (Name, Street, [facility]): [facility] [place], [place] [facility] Phone:  [phone]  Fax:  [phone]     3. On Med List: yes  4. Last Visit with PCP:  5. Next visit date with PCP:   Agent: Please be advised that RX refills may take up to 3 business days. We ask that you follow-up with your pharmacy.

## 2021-07-09 NOTE — Telephone Encounter (Signed)
Check [place] registry last filled 04/15/2021.Marland KitchenJohny Larsen

## 2021-07-10 MED ORDER — HYDROCODONE-ACETAMINOPHEN 5-325 MG PO TABS: 1.0000 | ORAL_TABLET | Freq: Three times a day (TID) | ORAL | 0 refills | Status: DC | PRN

## 2021-07-10 NOTE — Telephone Encounter (Signed)
Okay.  Thanks.

## 2021-08-06 ENCOUNTER — Encounter: Payer: Self-pay | Admitting: Internal Medicine

## 2021-08-06 ENCOUNTER — Ambulatory Visit (INDEPENDENT_AMBULATORY_CARE_PROVIDER_SITE_OTHER): Payer: Medicare Other | Admitting: Internal Medicine

## 2021-08-06 DIAGNOSIS — G8929 Other chronic pain: Secondary | ICD-10-CM

## 2021-08-06 DIAGNOSIS — M25561 Pain in right knee: Secondary | ICD-10-CM

## 2021-08-06 DIAGNOSIS — E890 Postprocedural hypothyroidism: Secondary | ICD-10-CM | POA: Diagnosis not present

## 2021-08-06 DIAGNOSIS — Z86711 Personal history of pulmonary embolism: Secondary | ICD-10-CM | POA: Diagnosis not present

## 2021-08-06 DIAGNOSIS — M5440 Lumbago with sciatica, unspecified side: Secondary | ICD-10-CM | POA: Diagnosis not present

## 2021-08-06 DIAGNOSIS — M25562 Pain in left knee: Secondary | ICD-10-CM

## 2021-08-06 DIAGNOSIS — M5441 Lumbago with sciatica, right side: Secondary | ICD-10-CM

## 2021-08-06 MED ORDER — METHYLPREDNISOLONE 4 MG PO TBPK: ORAL_TABLET | ORAL | 0 refills | Status: DC

## 2021-08-06 MED ORDER — HYDROCODONE-ACETAMINOPHEN 5-325 MG PO TABS: 1.0000 | ORAL_TABLET | Freq: Three times a day (TID) | ORAL | 0 refills | Status: DC | PRN

## 2021-08-06 NOTE — Assessment & Plan Note (Signed)
On Xarelto 

## 2021-08-06 NOTE — Progress Notes (Signed)
Subjective:  Patient ID: Ashley Larsen, female    DOB: 16-Oct-1950  Age: 71 y.o. MRN: [id number]  CC: No chief complaint on file.   HPI Ashley Larsen presents for LBP, B knees R>L  swelling and pain F/u on HTN, hypothyroidism  Outpatient Medications Prior to Visit  Medication Sig Dispense Refill   amLODipine (NORVASC) 5 MG tablet TAKE 1 TABLET(5 MG) BY MOUTH DAILY 90 tablet 2   Artificial Tear Solution (GENTEAL TEARS) 0.1-0.2-0.3 % SOLN Place 1 drop into both eyes daily as needed (Dry eye).     Cholecalciferol (VITAMIN D3) 50 MCG (2000 UT) capsule Take 1 capsule (2,000 Units total) by mouth daily. 100 capsule 3   fexofenadine (ALLEGRA) 180 MG tablet Take 1 tablet (180 mg total) by mouth daily as needed for allergies. 90 tablet 3   fluticasone (FLONASE) 50 MCG/ACT nasal spray Place 2 sprays into both nostrils daily. (Patient taking differently: Place 2 sprays into both nostrils daily as needed for allergies or rhinitis.) 16 g 6   levothyroxine (SYNTHROID) 100 MCG tablet TAKE 1 TABLET BY MOUTH BEFORE BREAKFAST 90 tablet 2   losartan (COZAAR) 100 MG tablet TAKE 1 TABLET(100 MG) BY MOUTH DAILY 90 tablet 3   potassium chloride (KLOR-CON) 10 MEQ tablet TAKE 1 TABLET(10 MEQ) BY MOUTH DAILY 90 tablet 1   rivaroxaban (XARELTO) 20 MG TABS tablet TAKE 1 TABLET BY MOUTH EVERY DAY WITH SUPPER 90 tablet 3   HYDROcodone-acetaminophen (NORCO/VICODIN) 5-325 MG tablet Take 1 tablet by mouth every 8 (eight) hours as needed for severe pain. 90 tablet 0   No facility-administered medications prior to visit.    ROS: Review of Systems  Constitutional:  Negative for activity change, appetite change, chills, fatigue and unexpected weight change.  HENT:  Negative for congestion, mouth sores and sinus pressure.   Eyes:  Negative for visual disturbance.  Respiratory:  Negative for cough and chest tightness.   Gastrointestinal:  Negative for abdominal pain and nausea.  Genitourinary:  Negative for difficulty  urinating, frequency and vaginal pain.  Musculoskeletal:  Positive for arthralgias, back pain and gait problem.  Skin:  Negative for pallor and rash.  Neurological:  Negative for dizziness, tremors, weakness, numbness and headaches.  Psychiatric/Behavioral:  Negative for confusion and sleep disturbance.     Objective:  BP 120/70 (BP Location: Left Arm, Patient Position: Sitting, Cuff Size: Normal)   Pulse 92   Temp 98.4 F (36.9 C) (Oral)   Ht 5' 5.5" (1.664 m)   Wt 177 lb (80.3 kg)   SpO2 96%   BMI 29.01 kg/m   BP Readings from Last 3 Encounters:  08/06/21 120/70  05/06/21 122/76  01/28/21 122/70    Wt Readings from Last 3 Encounters:  08/06/21 177 lb (80.3 kg)  05/06/21 180 lb (81.6 kg)  01/28/21 174 lb 6.4 oz (79.1 kg)    Physical Exam Constitutional:      General: She is not in acute distress.    Appearance: She is well-developed. She is obese.  HENT:     Head: Normocephalic.     Right Ear: External ear normal.     Left Ear: External ear normal.     Nose: Nose normal.  Eyes:     General:        Right eye: No discharge.        Left eye: No discharge.     Conjunctiva/sclera: Conjunctivae normal.     Pupils: Pupils are equal, round, and reactive to light.  Neck:     Thyroid: No thyromegaly.     Vascular: No JVD.     Trachea: No tracheal deviation.  Cardiovascular:     Rate and Rhythm: Normal rate and regular rhythm.     Heart sounds: Normal heart sounds.  Pulmonary:     Effort: No respiratory distress.     Breath sounds: No stridor. No wheezing.  Abdominal:     General: Bowel sounds are normal. There is no distension.     Palpations: Abdomen is soft. There is no mass.     Tenderness: There is no abdominal tenderness. There is no guarding or rebound.  Musculoskeletal:        General: No tenderness.     Cervical back: Normal range of motion and neck supple. No rigidity.  Lymphadenopathy:     Cervical: No cervical adenopathy.  Skin:    Findings: No  erythema or rash.  Neurological:     Mental Status: She is oriented to person, place, and time.     Cranial Nerves: No cranial nerve deficit.     Motor: No abnormal muscle tone.     Coordination: Coordination normal.     Gait: Gait abnormal.     Deep Tendon Reflexes: Reflexes normal.  Psychiatric:        Behavior: Behavior normal.        Thought Content: Thought content normal.        Judgment: Judgment normal.   R>L knee swelling/pain; limping LS w/pain  Lab Results  Component Value Date   WBC 5.1 05/06/2021   HGB 12.8 05/06/2021   HCT 38.0 05/06/2021   PLT 260.0 05/06/2021   GLUCOSE 86 05/06/2021   CHOL 249 (A) 06/11/2021   TRIG 109 06/11/2021   HDL 48 06/03/2020   LDLDIRECT 185.7 01/19/2012   LDLCALC 179 06/11/2021   ALT 20 05/06/2021   AST 20 05/06/2021   NA 142 05/06/2021   K 3.8 05/06/2021   CL 107 05/06/2021   CREATININE 0.82 05/06/2021   BUN 10 05/06/2021   CO2 30 05/06/2021   TSH 1.07 05/06/2021   INR 1.0 07/24/2019   HGBA1C 5.6 06/11/2021    No results found.  Assessment & Plan:   Problem List Items Addressed This Visit     History of pulmonary embolism    On Xarelto      Hypothyroidism, postablative    On Levothroid      Knee pain, bilateral    OA - worse Rx - Medrol pac  Blue-Emu cream was recommended to use 2-3 times a day       Low back pain    Cont on Norco prn  Potential benefits of a long term opioids use as well as potential risks (i.e. addiction risk, apnea etc) and complications (i.e. Somnolence, constipation and others) were explained to the patient and were aknowledged.      Relevant Medications   methylPREDNISolone (MEDROL DOSEPAK) 4 MG TBPK tablet   HYDROcodone-acetaminophen (NORCO/VICODIN) 5-325 MG tablet      Meds ordered this encounter  Medications   methylPREDNISolone (MEDROL DOSEPAK) 4 MG TBPK tablet    Sig: As directed    Dispense:  21 tablet    Refill:  0   HYDROcodone-acetaminophen (NORCO/VICODIN) 5-325 MG  tablet    Sig: Take 1 tablet by mouth every 8 (eight) hours as needed for severe pain.    Dispense:  90 tablet    Refill:  0      Follow-up: No  follow-ups on file.  Walker Kehr, MD

## 2021-08-06 NOTE — Assessment & Plan Note (Signed)
On Levothroid 

## 2021-08-06 NOTE — Assessment & Plan Note (Signed)
OA - worse Rx - Medrol pac  Blue-Emu cream was recommended to use 2-3 times a day

## 2021-08-07 ENCOUNTER — Encounter: Payer: Self-pay | Admitting: Podiatry

## 2021-09-18 ENCOUNTER — Ambulatory Visit: Payer: Medicare Other | Admitting: Podiatry

## 2021-10-13 ENCOUNTER — Other Ambulatory Visit (INDEPENDENT_AMBULATORY_CARE_PROVIDER_SITE_OTHER): Payer: Medicare Other

## 2021-10-13 DIAGNOSIS — E890 Postprocedural hypothyroidism: Secondary | ICD-10-CM

## 2021-10-13 LAB — T4, FREE: Free T4: 0.96 ng/dL (ref 0.60–1.60)

## 2021-10-13 LAB — TSH: TSH: 0.87 u[IU]/mL (ref 0.35–5.50)

## 2021-10-14 ENCOUNTER — Ambulatory Visit: Payer: Medicare Other | Admitting: Podiatry

## 2021-10-16 ENCOUNTER — Encounter: Payer: Self-pay | Admitting: Endocrinology

## 2021-10-16 ENCOUNTER — Ambulatory Visit (INDEPENDENT_AMBULATORY_CARE_PROVIDER_SITE_OTHER): Payer: Medicare Other | Admitting: Endocrinology

## 2021-10-16 VITALS — BP 116/72 | HR 95 | Ht 65.0 in | Wt 175.8 lb

## 2021-10-16 DIAGNOSIS — E890 Postprocedural hypothyroidism: Secondary | ICD-10-CM | POA: Diagnosis not present

## 2021-10-16 MED ORDER — LEVOTHYROXINE SODIUM 100 MCG PO TABS: ORAL_TABLET | ORAL | 3 refills | Status: DC

## 2021-10-16 NOTE — Progress Notes (Unsigned)
Patient ID: Ashley Larsen, female   DOB: 1950-08-28, 71 y.o.   MRN: [id number]    Reason for Appointment:  Post ablative hypothyroidism, followup   History of Present Illness:   She had hyperthyroidism first diagnosed in 01/2013 and treated with radioactive iodine on 05/03/13 with 12 mCi She became hypothyroid subsequently and in 6/15 with a free T4 level of 0.57 she was started on 112 mcg Synthroid  Recent history: Her dose was reduced to 88 mcg in 07/2013 because of relatively high free T4 of 1.4 and low TSH   When her TSH was high in 3/17 associated with fatigue for a couple of months she was changed to 88 g, 7-1/2 tablets weekly Subsequently was changed to 100 g daily and this dose has been continued. She is on generic levothyroxine  She feels fairly good and not complaining of any unusual tiredness She has chronic cold intolerance No weight change  She has been regular with her levothyroxine daily before breakfast, takes it with water  Not on any vitamin supplements  Her TSH has been consistently normal and again this time   Wt Readings from Last 3 Encounters:  10/16/21 175 lb 12.8 oz (79.7 kg)  08/06/21 177 lb (80.3 kg)  05/06/21 180 lb (81.6 kg)    Labs:   Lab Results  Component Value Date   TSH 0.87 10/13/2021   TSH 1.07 05/06/2021   TSH 1.14 10/07/2020   FREET4 0.96 10/13/2021   FREET4 1.02 05/06/2021   FREET4 1.08 10/07/2020         Allergies as of 10/16/2021       Reactions   Metronidazole Other (See Comments)   REACTION: upset stomach   Oxycodone-acetaminophen Other (See Comments)   Pt tolerates Vicodin Caused Halucinations        Medication List        Accurate as of October 16, 2021 11:12 AM. If you have any questions, ask your nurse or doctor.          amLODipine 5 MG tablet Commonly known as: NORVASC TAKE 1 TABLET(5 MG) BY MOUTH  DAILY   fexofenadine 180 MG tablet Commonly known as: ALLEGRA Take 1 tablet (180 mg total) by mouth daily as needed for allergies.   fluticasone 50 MCG/ACT nasal spray Commonly known as: FLONASE Place 2 sprays into both nostrils daily. What changed:  when to take this reasons to take this   GenTeal Tears 0.1-0.2-0.3 % Soln Place 1 drop into both eyes daily as needed (Dry eye).   HYDROcodone-acetaminophen 5-325 MG tablet Commonly known as: NORCO/VICODIN Take 1 tablet by mouth every 8 (eight) hours as needed for severe pain.   levothyroxine 100 MCG tablet Commonly known as: SYNTHROID TAKE 1 TABLET BY MOUTH BEFORE BREAKFAST   losartan 100 MG tablet Commonly known as: COZAAR TAKE 1 TABLET(100 MG) BY MOUTH DAILY   methylPREDNISolone 4 MG Tbpk tablet Commonly known as: MEDROL DOSEPAK As directed   potassium chloride 10 MEQ  tablet Commonly known as: KLOR-CON TAKE 1 TABLET(10 MEQ) BY MOUTH DAILY   rivaroxaban 20 MG Tabs tablet Commonly known as: Xarelto TAKE 1 TABLET BY MOUTH EVERY DAY WITH SUPPER   Vitamin D3 50 MCG (2000 UT) capsule Take 1 capsule (2,000 Units total) by mouth daily.            Past Medical History:  Diagnosis Date   Allergic rhinitis    Allergy    Alopecia    Anemia    Arthritis    Asthma    does not use an inhaler   Bronchitis    Cataract    bilateral cateracts   COPD (chronic obstructive pulmonary disease) (HCC)    Diverticulosis    GERD (gastroesophageal reflux disease)    Hypertension    Hyperthyroidism    IBS (irritable bowel syndrome)    Insomnia    Ovarian cyst    Paresthesia    Pulmonary embolism ([place])    Restless leg syndrome    Sickle cell trait ([place])     Past Surgical History:  Procedure Laterality Date   ABDOMINAL HYSTERECTOMY Right 07/24/2019   Procedure: HYSTERECTOMY ABDOMINAL, right salpingo oophorectomy;  Surgeon: Molli Posey, MD;  Location: [place];  Service: Gynecology;  Laterality: Right;   COLONOSCOPY      KNEE ARTHROSCOPY     Left   OOPHORECTOMY     Left   ROTATOR CUFF REPAIR     TUBAL LIGATION     UPPER GASTROINTESTINAL ENDOSCOPY     [place] teeth extraction      Family History  Problem Relation Age of Onset   Cancer Brother        brother - stomach ca   Stomach cancer Brother        dx age 67   Heart disease Father    Other Mother        sclerdermia   Colon cancer Sister        dx in her 41's   Hypertension Other    Cancer Sister 42       colon   Diabetes Sister    Colon polyps Sister        and Brother   Cancer Sister 18        colon   Kidney disease Brother    Diabetes Brother    Diabetes Brother    Diabetes Paternal Grandfather    Esophageal cancer Neg Hx    Rectal cancer Neg Hx    Breast cancer Neg Hx     Social History:  reports that she quit smoking about 18 months ago. Her smoking use included cigarettes. She has a 5.00 pack-year smoking history. She has never used smokeless tobacco. She reports that she does not drink alcohol and does not use drugs.  Allergies:  Allergies  Allergen Reactions   Metronidazole Other (See Comments)    REACTION: upset stomach   Oxycodone-Acetaminophen Other (See Comments)    Pt tolerates Vicodin  Caused Halucinations     Review of Systems:   HYPERTENSION: Has history of high blood pressure  treated with losartan 100 mg daily and amlodipine by her PCP Also on potassium supplements  BP Readings from Last 3 Encounters:  10/16/21 116/72  08/06/21 120/70  05/06/21 122/76   She has history of Graves' ophthalmopathy Occasionally will have dry eyes requiring artificial tears    Examination:   BP 116/72   Pulse 95   Ht '5\' 5"'$  (1.651 m)   Wt  175 lb 12.8 oz (79.7 kg)   SpO2 99%   BMI 29.25 kg/m   Minimal proptosis of the eyes present   Assessment/Plan:   Post-ablative hypothyroidism  She has post ablative hypothyroidism since 2015 She has been on generic levothyroxine 100 mcg daily for nearly 6 years  She  feels fairly good She has taken her levothyroxine very consistently before breakfast in the morning TSH is consistently normal   She will continue the same dose Follow-up annually   [name] 10/16/2021, 11:12 AM   Note: This office note was prepared with Dragon voice recognition system technology. Any transcriptional errors that result from this process are unintentional.

## 2021-10-27 ENCOUNTER — Ambulatory Visit
Admission: EM | Admit: 2021-10-27 | Discharge: 2021-10-27 | Disposition: A | Discharge status: Home / Self Care | Payer: Medicare Other

## 2021-10-27 ENCOUNTER — Emergency Department (HOSPITAL_COMMUNITY)
Admission: EM | Admit: 2021-10-27 | Discharge: 2021-10-27 | Disposition: A | Discharge status: Home / Self Care | Payer: Medicare Other | Attending: Emergency Medicine | Admitting: Emergency Medicine

## 2021-10-27 ENCOUNTER — Emergency Department (HOSPITAL_COMMUNITY): Payer: Medicare Other

## 2021-10-27 ENCOUNTER — Encounter (HOSPITAL_COMMUNITY): Payer: Self-pay | Admitting: Emergency Medicine

## 2021-10-27 DIAGNOSIS — M25551 Pain in right hip: Secondary | ICD-10-CM | POA: Insufficient documentation

## 2021-10-27 DIAGNOSIS — R1031 Right lower quadrant pain: Secondary | ICD-10-CM | POA: Insufficient documentation

## 2021-10-27 DIAGNOSIS — Z7901 Long term (current) use of anticoagulants: Secondary | ICD-10-CM | POA: Diagnosis not present

## 2021-10-27 LAB — COMPREHENSIVE METABOLIC PANEL
ALT: 33 U/L (ref 0–44)
AST: 27 U/L (ref 15–41)
Albumin: 4.1 g/dL (ref 3.5–5.0)
Alkaline Phosphatase: 70 U/L (ref 38–126)
Anion gap: 5 (ref 5–15)
BUN: 10 mg/dL (ref 8–23)
CO2: 26 mmol/L (ref 22–32)
Calcium: 9.6 mg/dL (ref 8.9–10.3)
Chloride: 109 mmol/L (ref 98–111)
Creatinine, Ser: 0.86 mg/dL (ref 0.44–1.00)
GFR, Estimated: >60 mL/min (ref >60)
Glucose, Bld: 88 mg/dL (ref 70–99)
Potassium: 3.7 mmol/L (ref 3.5–5.1)
Sodium: 140 mmol/L (ref 135–145)
Total Bilirubin: 0.3 mg/dL (ref 0.3–1.2)
Total Protein: 7.2 g/dL (ref 6.5–8.1)

## 2021-10-27 LAB — URINALYSIS, ROUTINE W REFLEX MICROSCOPIC
Bilirubin Urine: NEGATIVE
Glucose, UA: NEGATIVE mg/dL
Hgb urine dipstick: NEGATIVE
Ketones, ur: NEGATIVE mg/dL
Nitrite: NEGATIVE
Protein, ur: NEGATIVE mg/dL
Specific Gravity, Urine: 1.013 (ref 1.005–1.030)
pH: 6 (ref 5.0–8.0)

## 2021-10-27 LAB — CBC
HCT: 36.8 % (ref 36.0–46.0)
Hemoglobin: 12.5 g/dL (ref 12.0–15.0)
MCH: 30.2 pg (ref 26.0–34.0)
MCHC: 34 g/dL (ref 30.0–36.0)
MCV: 88.9 fL (ref 80.0–100.0)
Platelets: 234 10*3/uL (ref 150–400)
RBC: 4.14 MIL/uL (ref 3.87–5.11)
RDW: 12.9 % (ref 11.5–15.5)
WBC: 5 10*3/uL (ref 4.0–10.5)
nRBC: 0 % (ref 0.0–0.2)

## 2021-10-27 LAB — LIPASE, BLOOD: Lipase: 25 U/L (ref 11–51)

## 2021-10-27 MED ORDER — IOHEXOL 350 MG/ML SOLN
75.0000 mL | Freq: Once | INTRAVENOUS | Status: AC | PRN
  Administered 2021-10-27: 75 mL via INTRAVENOUS

## 2021-10-27 MED ORDER — GABAPENTIN 300 MG PO CAPS: 300.0000 mg | ORAL_CAPSULE | Freq: Every evening | ORAL | 0 refills | Status: DC | PRN

## 2021-10-27 MED ORDER — AMOXICILLIN-POT CLAVULANATE 875-125 MG PO TABS: 1.0000 | ORAL_TABLET | Freq: Two times a day (BID) | ORAL | 0 refills | Status: AC

## 2021-10-27 NOTE — ED Provider Notes (Signed)
[facility] EMERGENCY DEPARTMENT Provider Note   CSN: [id number] Arrival date & time: 10/27/21  1604     History  Chief Complaint  Patient presents with   Abdominal Pain    Ashley Larsen is a 71 y.o. female presenting emergency department abdominal pain.  Patient reports she has had about 2 weeks of pain mostly in the right lower hip and right lower abdomen.  It is worse at night when she is lying in bed.  She cannot get comfortable.  She denies nausea, vomiting, diarrhea.  Denies having this pain before.  HPI     Home Medications Prior to Admission medications   Medication Sig Start Date End Date Taking? Authorizing Provider  amoxicillin-clavulanate (AUGMENTIN) 875-125 MG tablet Take 1 tablet by mouth every 12 (twelve) hours for 7 days. 10/27/21 11/03/21 Yes Nelsie Domino, Carola Rhine, MD  gabapentin (NEURONTIN) 300 MG capsule Take 1 capsule (300 mg total) by mouth at bedtime as needed for up to 15 doses. 10/27/21  Yes Anah Billard, Carola Rhine, MD  amLODipine (NORVASC) 5 MG tablet TAKE 1 TABLET(5 MG) BY MOUTH DAILY 02/11/21   [name], [name], MD  Artificial Tear Solution (GENTEAL TEARS) 0.1-0.2-0.3 % SOLN Place 1 drop into both eyes daily as needed (Dry eye).    [provider]  Cholecalciferol (VITAMIN D3) 50 MCG (2000 UT) capsule Take 1 capsule (2,000 Units total) by mouth daily. 07/17/19   [name], [name], MD  fexofenadine (ALLEGRA) 180 MG tablet Take 1 tablet (180 mg total) by mouth daily as needed for allergies. 07/24/20   [name], [name], MD  fluticasone (FLONASE) 50 MCG/ACT nasal spray Place 2 sprays into both nostrils daily. Patient taking differently: Place 2 sprays into both nostrils daily as needed for allergies or rhinitis. 04/15/17   [name], [name], MD  HYDROcodone-acetaminophen (NORCO/VICODIN) 5-325 MG tablet Take 1 tablet by mouth every 8 (eight) hours as needed for severe pain. 08/06/21 07/19/23  [name], [name], MD  levothyroxine  (SYNTHROID) 100 MCG tablet TAKE 1 TABLET BY MOUTH BEFORE BREAKFAST 10/16/21   [name], MD  losartan (COZAAR) 100 MG tablet TAKE 1 TABLET(100 MG) BY MOUTH DAILY 12/31/20   [name], [name], MD  methylPREDNISolone (MEDROL DOSEPAK) 4 MG TBPK tablet As directed 08/06/21   [name], [name], MD  potassium chloride (KLOR-CON) 10 MEQ tablet TAKE 1 TABLET(10 MEQ) BY MOUTH DAILY 06/16/21   [name], [name], MD  rivaroxaban (XARELTO) 20 MG TABS tablet TAKE 1 TABLET BY MOUTH EVERY DAY WITH SUPPER 01/28/21   [name], [name], MD      Allergies    Metronidazole and Oxycodone-acetaminophen    Review of Systems   Review of Systems  Physical Exam Updated Vital Signs BP (!) 147/84   Pulse 70   Temp 98.4 F (36.9 C) (Oral)   Resp 16   SpO2 100%  Physical Exam Constitutional:      General: She is not in acute distress. HENT:     Head: Normocephalic and atraumatic.  Eyes:     Conjunctiva/sclera: Conjunctivae normal.     Pupils: Pupils are equal, round, and reactive to light.  Cardiovascular:     Rate and Rhythm: Normal rate and regular rhythm.  Pulmonary:     Effort: Pulmonary effort is normal. No respiratory distress.  Abdominal:     General: There is no distension.     Tenderness: There is no abdominal tenderness. There is no guarding or rebound. Negative signs include Murphy's sign.  Skin:    General: Skin is warm and dry.  Neurological:     General: No focal deficit present.     Mental Status: She is alert. Mental status is at baseline.  Psychiatric:        Mood and Affect: Mood normal.        Behavior: Behavior normal.     ED Results / Procedures / Treatments   Labs (all labs ordered are listed, but only abnormal results are displayed) Labs Reviewed  URINALYSIS, ROUTINE W REFLEX MICROSCOPIC - Abnormal; Notable for the following components:      Result Value   APPearance HAZY (*)    Leukocytes,Ua TRACE (*)    Bacteria, UA RARE (*)    All other components within  normal limits  LIPASE, BLOOD  COMPREHENSIVE METABOLIC PANEL  CBC    EKG None  Radiology CT ABDOMEN PELVIS W CONTRAST  Result Date: 10/27/2021 CLINICAL DATA:  Right lower quadrant abdominal pain EXAM: CT ABDOMEN AND PELVIS WITH CONTRAST TECHNIQUE: Multidetector CT imaging of the abdomen and pelvis was performed using the standard protocol following bolus administration of intravenous contrast. RADIATION DOSE REDUCTION: This exam was performed according to the departmental dose-optimization program which includes automated exposure control, adjustment of the mA and/or kV according to patient size and/or use of iterative reconstruction technique. CONTRAST:  30m OMNIPAQUE IOHEXOL 350 MG/ML SOLN COMPARISON:  CT abdomen and pelvis 01/31/2013 FINDINGS: Lower chest: No acute abnormality. Hepatobiliary: No focal liver abnormality is seen. No gallstones, gallbladder wall thickening, or biliary dilatation. Pancreas: Unremarkable. No pancreatic ductal dilatation or surrounding inflammatory changes. Spleen: Normal in size without focal abnormality. Adrenals/Urinary Tract: Adrenal glands are unremarkable. Low-attenuation lesions in the kidneys are statistically likely to represent cysts. No follow-up is required. No urinary calculi or hydronephrosis. Unremarkable bladder. Stomach/Bowel: Normal appendix. Normal caliber large and small bowel. Colonic diverticulosis. Mild stranding about a diverticulum in the distal descending colon (series 3/image 63) may be due to mild diverticulitis. No abscess or perforation. Unremarkable stomach. Vascular/Lymphatic: Aortic atherosclerosis. No enlarged abdominal or pelvic lymph nodes. Reproductive: Status post hysterectomy. No adnexal masses. Other: No free intraperitoneal fluid or air. Musculoskeletal: Thoracolumbar spondylosis. Advanced degenerative arthritis right hip. No acute abnormality. IMPRESSION: Possible mild descending colon diverticulitis. No explanation for right lower  quadrant pain.  Normal appendix. Advanced degenerative arthritis right hip. Aortic Atherosclerosis (ICD10-I70.0). Electronically Signed   By: TPlacido SouM.D.   On: 10/27/2021 19:42    Procedures Procedures    Medications Ordered in ED Medications  iohexol (OMNIPAQUE) 350 MG/ML injection 75 mL (75 mLs Intravenous Contrast Given 10/27/21 1933)    ED Course/ Medical Decision Making/ A&P                           Medical Decision Making Amount and/or Complexity of Data Reviewed Labs: ordered. Radiology: ordered.  Risk Prescription drug management.   This patient presents to the Emergency Department with complaint of abdominal pain. This involves an extensive number of treatment options, and is a complaint that carries with it a high risk of complications and morbidity.  The differential diagnosis includes, but is not limited to, constipation versus ureteral colic versus appendicitis versus lymphadenopathy versus arthritis versus sciatica versus other  I ordered, reviewed, and interpreted labs, including BMP and CBC.  There were no immediate, life-threatening emergencies found in this labwork.  Patient's UA showed  no signs of infection I ordered imaging studies which included CT abdomen  pelvis I independently visualized and interpreted imaging which showed no acute findings of the right lower quadrant, questionable diverticulitis or colitis findings of the left side and the monitor tracing which showed sinus rhythm  After the interventions stated above, I reevaluated the patient and found that they remained clinically stable.  Based on the patient's clinical exam, vital signs, risk factors, and ED testing, I felt that the patient's overall risk of life-threatening emergency such as bowel perforation, surgical emergency, or sepsis was quite low.  I suspect this clinical presentation is most consistent with musculoskeletal pain or nerve pain, but explained to the patient that this  evaluation was not a definitive diagnostic workup.  I discussed outpatient follow up with primary care provider, and provided specialist office number on the patient's discharge paper if a referral was deemed necessary.  I discussed return precautions with the patient. I felt the patient was clinically stable for discharge.  We can try gabapentin 300 mg as needed, particularly at night, to see if this helps alleviate some of her pain.  She will follow-up with her PCP.  I also provided a watch and wait prescription for Augmentin given the inflammatory findings around the colon.  She is not having active symptoms of diverticulitis and I explained that she does not need to be on it at this point, but if she does develop symptoms she can start taking it        Final Clinical Impression(s) / ED Diagnoses Final diagnoses:  Right lower quadrant abdominal pain  Right hip pain    Rx / DC Orders ED Discharge Orders          Ordered    amoxicillin-clavulanate (AUGMENTIN) 875-125 MG tablet  Every 12 hours        10/27/21 2043    gabapentin (NEURONTIN) 300 MG capsule  At bedtime PRN        10/27/21 2043              Wyvonnia Dusky, MD 10/27/21 2044
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
## 2021-10-27 NOTE — ED Provider Triage Note (Signed)
Emergency Medicine Provider Triage Evaluation Note  Ashley Larsen , a 71 y.o. female  was evaluated in triage.  Pt complains of right-sided abdominal pain for the last 2 weeks, worse at night.  Denies nausea vomiting, changes in bowel or bladder habits. Reports prior complete hysterectomy Review of Systems  Positive: Right side abdominal pain Negative: Nausea, vomiting, changes in bowel or bladder habits.  Physical Exam  BP (!) 147/83 (BP Location: Right Arm)   Pulse 76   Temp 98.4 F (36.9 C) (Oral)   Resp 16   SpO2 97%  Gen:   Awake, no distress   Resp:  Normal effort  MSK:   Moves extremities without difficulty  Other:    Medical Decision Making  Medically screening exam initiated at 4:43 PM.  Appropriate orders placed.  Ashley Larsen was informed that the remainder of the evaluation will be completed by another provider, this initial triage assessment does not replace that evaluation, and the importance of remaining in the ED until their evaluation is complete.     Tacy Learn, PA-C 10/27/21 1644

## 2021-10-27 NOTE — Discharge Instructions (Addendum)
I prescribed a medicine called gabapentin which you try at bedtime to see if it helps with your pain.  This medicine treats nerve pain.  Your pain may be coming from your hip or a pinched nerve in your back or your hip.  Please follow-up with your primary care doctor in the office for this visit.  Your CT scan thankfully did not show any surgical emergency inside your belly.  We did see some signs of very mild inflammation around your left colon, which can be called early diverticulitis.  You are not having symptoms of diverticulitis otherwise.  Therefore we did not start you on antibiotics, but I did give you a prescription to keep, as a watch and wait approach.  If you begin having nausea, vomiting, loose stools or bloody bowel movements, particularly with pain in your left lower abdomen, you can start taking the Augmentin antibiotic.

## 2021-10-27 NOTE — ED Notes (Signed)
Pt ambulated to restroom without assistance.

## 2021-10-27 NOTE — ED Triage Notes (Signed)
Patient complains of intermittent sharp and stabbing right sided abdominal pain that started over two weeks ago, pain is mild during the day but is worse at night. Patient is alert, oriented, denies vomiting and diarrhea.

## 2021-10-27 NOTE — ED Triage Notes (Signed)
Pt presents to uc with co of rlq abd pain that worsens at night. Pt reports pain was worse last night has been going on for 3 weeks, pt reports hysterectomy 2 years ago. Pt reports daily regular bowel movements.

## 2021-10-28 ENCOUNTER — Telehealth: Payer: Self-pay | Admitting: Internal Medicine

## 2021-10-28 NOTE — Telephone Encounter (Signed)
PT calls today in regards to getting samples of xarelto. I had let her know that I thought we had some in but was not sure what the process was for giving out the samples.  CB: [phone]

## 2021-10-29 MED ORDER — RIVAROXABAN 20 MG PO TABS: ORAL_TABLET | ORAL | 0 refills | Status: DC

## 2021-10-29 NOTE — Telephone Encounter (Signed)
Notified pt samples of xarelto 20 mg ready for pick-up.Marland KitchenJohny Larsen

## 2021-10-29 NOTE — Telephone Encounter (Signed)
Patients daughter came by and picked up, I called and confirmed with patient that it was okay

## 2021-10-30 ENCOUNTER — Telehealth: Payer: Self-pay | Admitting: Internal Medicine

## 2021-10-30 NOTE — Telephone Encounter (Signed)
PT is requesting a call back when possible to get their last mammogram date updated in our system.  CB: [phone]

## 2021-11-02 ENCOUNTER — Telehealth: Payer: Self-pay | Admitting: *Deleted

## 2021-11-02 NOTE — Telephone Encounter (Signed)
Notified pt last mammogram was 2018 on [facility].

## 2021-11-02 NOTE — Telephone Encounter (Signed)
        Patient  visited [facility] ed on 10/27/2021  for Pain   Telephone encounter attempt :  !st Unable to leave message   [facility] [phone] [address] , [place] [address] Email : [email or web address]

## 2021-11-06 ENCOUNTER — Encounter: Payer: Self-pay | Admitting: Podiatry

## 2021-11-06 ENCOUNTER — Ambulatory Visit (INDEPENDENT_AMBULATORY_CARE_PROVIDER_SITE_OTHER): Payer: Medicare Other | Admitting: Podiatry

## 2021-11-06 DIAGNOSIS — B351 Tinea unguium: Secondary | ICD-10-CM

## 2021-11-06 DIAGNOSIS — M79675 Pain in left toe(s): Secondary | ICD-10-CM

## 2021-11-06 DIAGNOSIS — Q828 Other specified congenital malformations of skin: Secondary | ICD-10-CM

## 2021-11-06 DIAGNOSIS — M79674 Pain in right toe(s): Secondary | ICD-10-CM

## 2021-11-06 DIAGNOSIS — D689 Coagulation defect, unspecified: Secondary | ICD-10-CM | POA: Diagnosis not present

## 2021-11-09 ENCOUNTER — Ambulatory Visit (INDEPENDENT_AMBULATORY_CARE_PROVIDER_SITE_OTHER): Payer: Medicare Other | Admitting: Internal Medicine

## 2021-11-09 ENCOUNTER — Encounter: Payer: Self-pay | Admitting: Internal Medicine

## 2021-11-09 VITALS — BP 122/68 | HR 86 | Temp 98.6°F | Ht 65.0 in | Wt 172.4 lb

## 2021-11-09 DIAGNOSIS — M25561 Pain in right knee: Secondary | ICD-10-CM | POA: Diagnosis not present

## 2021-11-09 DIAGNOSIS — G8929 Other chronic pain: Secondary | ICD-10-CM | POA: Diagnosis not present

## 2021-11-09 DIAGNOSIS — M25562 Pain in left knee: Secondary | ICD-10-CM

## 2021-11-09 DIAGNOSIS — R1031 Right lower quadrant pain: Secondary | ICD-10-CM | POA: Diagnosis not present

## 2021-11-09 DIAGNOSIS — M25551 Pain in right hip: Secondary | ICD-10-CM

## 2021-11-09 DIAGNOSIS — Z23 Encounter for immunization: Secondary | ICD-10-CM

## 2021-11-09 MED ORDER — GABAPENTIN 300 MG PO CAPS: 300.0000 mg | ORAL_CAPSULE | Freq: Every evening | ORAL | 3 refills | Status: DC | PRN

## 2021-11-09 MED ORDER — HYDROCODONE-ACETAMINOPHEN 5-325 MG PO TABS: 1.0000 | ORAL_TABLET | Freq: Three times a day (TID) | ORAL | 0 refills | Status: DC | PRN

## 2021-11-09 NOTE — Progress Notes (Signed)
Subjective:  Patient ID: Ashley Larsen, female    DOB: 04-09-1950  Age: 71 y.o. MRN: [id number]  CC: Follow-up (3 month f/u)   HPI Ashley Larsen presents for LBP, hypothyroidism, HTN C/o RLQ pain x 4 weeks - pt went to ER and had a CT. Pain was worse at night when laying on the R side. No fever. R hip, groin - very painful w/ROM R knee w/pain Pain is better on Gabapentin. Pt has a Rx for Augmentin - Dr Langston Masker  Outpatient Medications Prior to Visit  Medication Sig Dispense Refill  . amLODipine (NORVASC) 5 MG tablet TAKE 1 TABLET(5 MG) BY MOUTH DAILY 90 tablet 2  . Artificial Tear Solution (GENTEAL TEARS) 0.1-0.2-0.3 % SOLN Place 1 drop into both eyes daily as needed (Dry eye).    . Cholecalciferol (VITAMIN D3) 50 MCG (2000 UT) capsule Take 1 capsule (2,000 Units total) by mouth daily. 100 capsule 3  . fexofenadine (ALLEGRA) 180 MG tablet Take 1 tablet (180 mg total) by mouth daily as needed for allergies. 90 tablet 3  . fluticasone (FLONASE) 50 MCG/ACT nasal spray Place 2 sprays into both nostrils daily. (Patient taking differently: Place 2 sprays into both nostrils daily as needed for allergies or rhinitis.) 16 g 6  . levothyroxine (SYNTHROID) 100 MCG tablet TAKE 1 TABLET BY MOUTH BEFORE BREAKFAST 90 tablet 3  . losartan (COZAAR) 100 MG tablet TAKE 1 TABLET(100 MG) BY MOUTH DAILY 90 tablet 3  . methylPREDNISolone (MEDROL DOSEPAK) 4 MG TBPK tablet As directed 21 tablet 0  . potassium chloride (KLOR-CON) 10 MEQ tablet TAKE 1 TABLET(10 MEQ) BY MOUTH DAILY 90 tablet 1  . rivaroxaban (XARELTO) 20 MG TABS tablet TAKE 1 TABLET BY MOUTH EVERY DAY WITH SUPPER 42 tablet 0  . gabapentin (NEURONTIN) 300 MG capsule Take 1 capsule (300 mg total) by mouth at bedtime as needed for up to 15 doses. 15 capsule 0  . HYDROcodone-acetaminophen (NORCO/VICODIN) 5-325 MG tablet Take 1 tablet by mouth every 8 (eight) hours as needed for severe pain. 90 tablet 0   No facility-administered medications prior to  visit.    ROS: Review of Systems  Constitutional:  Negative for activity change, appetite change, chills, fatigue and unexpected weight change.  HENT:  Negative for congestion, mouth sores and sinus pressure.   Eyes:  Negative for visual disturbance.  Respiratory:  Negative for cough and chest tightness.   Gastrointestinal:  Positive for abdominal pain. Negative for abdominal distention and nausea.  Genitourinary:  Negative for difficulty urinating, frequency and vaginal pain.  Musculoskeletal:  Positive for back pain. Negative for gait problem.  Skin:  Negative for pallor and rash.  Neurological:  Negative for dizziness, tremors, weakness, numbness and headaches.  Psychiatric/Behavioral:  Negative for confusion and sleep disturbance.     Objective:  BP 122/68 (BP Location: Left Arm)   Pulse 86   Temp 98.6 F (37 C) (Oral)   Ht '5\' 5"'$  (1.651 m)   Wt 172 lb 6.4 oz (78.2 kg)   SpO2 96%   BMI 28.69 kg/m   BP Readings from Last 3 Encounters:  11/09/21 122/68  10/27/21 (!) 151/84  10/27/21 132/75    Wt Readings from Last 3 Encounters:  11/09/21 172 lb 6.4 oz (78.2 kg)  10/16/21 175 lb 12.8 oz (79.7 kg)  08/06/21 177 lb (80.3 kg)    Physical Exam Constitutional:      General: She is not in acute distress.    Appearance: She is well-developed.  She is obese. She is not ill-appearing.  HENT:     Head: Normocephalic.     Right Ear: External ear normal.     Left Ear: External ear normal.     Nose: Nose normal.  Eyes:     General:        Right eye: No discharge.        Left eye: No discharge.     Conjunctiva/sclera: Conjunctivae normal.     Pupils: Pupils are equal, round, and reactive to light.  Neck:     Thyroid: No thyromegaly.     Vascular: No JVD.     Trachea: No tracheal deviation.  Cardiovascular:     Rate and Rhythm: Normal rate and regular rhythm.     Heart sounds: Normal heart sounds.  Pulmonary:     Effort: No respiratory distress.     Breath sounds: No  stridor. No wheezing.  Abdominal:     General: Bowel sounds are normal. There is no distension.     Palpations: Abdomen is soft. There is no mass.     Tenderness: There is no abdominal tenderness. There is no guarding or rebound.  Musculoskeletal:        General: No tenderness.     Cervical back: Normal range of motion and neck supple. No rigidity.  Lymphadenopathy:     Cervical: No cervical adenopathy.  Skin:    Findings: No erythema or rash.  Neurological:     Mental Status: She is oriented to person, place, and time.     Cranial Nerves: No cranial nerve deficit.     Motor: No abnormal muscle tone.     Coordination: Coordination normal.     Deep Tendon Reflexes: Reflexes normal.  Psychiatric:        Behavior: Behavior normal.        Thought Content: Thought content normal.        Judgment: Judgment normal.  R hip, groin - very painful w/ROM, stff R knee w/pain Str leg elev (-) B    A total time of 45 minutes was spent preparing to see the patient, reviewing tests, x-rays, operative reports and other medical records.  Also, obtaining history and performing comprehensive physical exam.  Additionally, counseling the patient regarding the above listed issues.   Finally, documenting clinical information in the health records, coordination of care, educating the patient re: hip OA, abd CT, LBP  Lab Results  Component Value Date   WBC 5.0 10/27/2021   HGB 12.5 10/27/2021   HCT 36.8 10/27/2021   PLT 234 10/27/2021   GLUCOSE 88 10/27/2021   CHOL 249 (A) 06/11/2021   TRIG 109 06/11/2021   HDL 48 06/03/2020   LDLDIRECT 185.7 01/19/2012   LDLCALC 179 06/11/2021   ALT 33 10/27/2021   AST 27 10/27/2021   NA 140 10/27/2021   K 3.7 10/27/2021   CL 109 10/27/2021   CREATININE 0.86 10/27/2021   BUN 10 10/27/2021   CO2 26 10/27/2021   TSH 0.87 10/13/2021   INR 1.0 07/24/2019   HGBA1C 5.6 06/11/2021    CT ABDOMEN PELVIS W CONTRAST  Result Date: 10/27/2021 CLINICAL DATA:  Right  lower quadrant abdominal pain EXAM: CT ABDOMEN AND PELVIS WITH CONTRAST TECHNIQUE: Multidetector CT imaging of the abdomen and pelvis was performed using the standard protocol following bolus administration of intravenous contrast. RADIATION DOSE REDUCTION: This exam was performed according to the departmental dose-optimization program which includes automated exposure control, adjustment of the mA and/or  kV according to patient size and/or use of iterative reconstruction technique. CONTRAST:  45m OMNIPAQUE IOHEXOL 350 MG/ML SOLN COMPARISON:  CT abdomen and pelvis 01/31/2013 FINDINGS: Lower chest: No acute abnormality. Hepatobiliary: No focal liver abnormality is seen. No gallstones, gallbladder wall thickening, or biliary dilatation. Pancreas: Unremarkable. No pancreatic ductal dilatation or surrounding inflammatory changes. Spleen: Normal in size without focal abnormality. Adrenals/Urinary Tract: Adrenal glands are unremarkable. Low-attenuation lesions in the kidneys are statistically likely to represent cysts. No follow-up is required. No urinary calculi or hydronephrosis. Unremarkable bladder. Stomach/Bowel: Normal appendix. Normal caliber large and small bowel. Colonic diverticulosis. Mild stranding about a diverticulum in the distal descending colon (series 3/image 63) may be due to mild diverticulitis. No abscess or perforation. Unremarkable stomach. Vascular/Lymphatic: Aortic atherosclerosis. No enlarged abdominal or pelvic lymph nodes. Reproductive: Status post hysterectomy. No adnexal masses. Other: No free intraperitoneal fluid or air. Musculoskeletal: Thoracolumbar spondylosis. Advanced degenerative arthritis right hip. No acute abnormality. IMPRESSION: Possible mild descending colon diverticulitis. No explanation for right lower quadrant pain.  Normal appendix. Advanced degenerative arthritis right hip. Aortic Atherosclerosis (ICD10-I70.0). Electronically Signed   By: TPlacido SouM.D.   On:  10/27/2021 19:42    Assessment & Plan:   Problem List Items Addressed This Visit     Hip pain, chronic, right - Primary    Worse. Ortho ref Cont w/Gabapentin Norco prn  Potential benefits of a long term opioids use as well as potential risks (i.e. addiction risk, apnea etc) and complications (i.e. Somnolence, constipation and others) were explained to the patient and were aknowledged.       Relevant Medications   HYDROcodone-acetaminophen (NORCO/VICODIN) 5-325 MG tablet   gabapentin (NEURONTIN) 300 MG capsule   Other Relevant Orders   Ambulatory referral to Orthopedic Surgery   Knee pain, bilateral    Blue-Emu cream was recommended to use 2-3 times a day        RLQ abdominal pain    Likely due to R hip pain due to OA. Abd CT was OK Ortho ref Cont w/Gabapentin Norco prn  Potential benefits of a long term opioids use as well as potential risks (i.e. addiction risk, apnea etc) and complications (i.e. Somnolence, constipation and others) were explained to the patient and were aknowledged.      Other Visit Diagnoses     Needs flu shot       Relevant Orders   Flu Vaccine QUAD High Dose(Fluad) (Completed)         Meds ordered this encounter  Medications  . HYDROcodone-acetaminophen (NORCO/VICODIN) 5-325 MG tablet    Sig: Take 1 tablet by mouth every 8 (eight) hours as needed for severe pain.    Dispense:  90 tablet    Refill:  0  . gabapentin (NEURONTIN) 300 MG capsule    Sig: Take 1 capsule (300 mg total) by mouth at bedtime as needed.    Dispense:  30 capsule    Refill:  3      Follow-up: Return in about 3 months (around 02/08/2022) for a follow-up visit.  AWalker Kehr MD

## 2021-11-09 NOTE — Assessment & Plan Note (Signed)
Worse. Ortho ref Cont w/Gabapentin Norco prn  Potential benefits of a long term opioids use as well as potential risks (i.e. addiction risk, apnea etc) and complications (i.e. Somnolence, constipation and others) were explained to the patient and were aknowledged.

## 2021-11-09 NOTE — Assessment & Plan Note (Signed)
Likely due to R hip pain due to OA. Abd CT was OK Ortho ref Cont w/Gabapentin Norco prn  Potential benefits of a long term opioids use as well as potential risks (i.e. addiction risk, apnea etc) and complications (i.e. Somnolence, constipation and others) were explained to the patient and were aknowledged.

## 2021-11-09 NOTE — Assessment & Plan Note (Signed)
Blue-Emu cream was recommended to use 2-3 times a day ? ?

## 2021-11-13 NOTE — Progress Notes (Signed)
  Subjective:  Patient ID: Ashley Larsen, female    DOB: 1950-06-08,  MRN: [id number]  Tony Friscia presents to clinic today for at risk foot care with h/o clotting disorder and painful porokeratotic lesion(s) b/l lower extremities and painful mycotic toenails that limit ambulation. Painful toenails interfere with ambulation. Aggravating factors include wearing enclosed shoe gear. Pain is relieved with periodic professional debridement. Painful porokeratotic lesions are aggravated when weightbearing with and without shoegear. Pain is relieved with periodic professional debridement.  New problem(s): None.   PCP is [name], [name], MD , and last visit was  August 06, 2021.  Allergies  Allergen Reactions   Metronidazole Other (See Comments)    REACTION: upset stomach   Oxycodone-Acetaminophen Other (See Comments)    Pt tolerates Vicodin  Caused Halucinations     Review of Systems: Negative except as noted in the HPI.  Objective: No changes noted in today's physical examination.  Ashley Larsen is a pleasant 71 y.o. female WD, WN in NAD. AAO x 3.  Vascular Examination:  CFT <3 seconds b/l LE. Palpable DP pulse(s) b/l LE. Palpable PT pulse(s) b/l LE. Pedal hair absent. No pain with calf compression b/l. Lower extremity skin temperature gradient within normal limits. No edema noted b/l LE. No cyanosis or clubbing noted b/l LE.  Dermatological Examination: Pedal integument with normal turgor, texture and tone BLE. No open wounds b/l LE. No interdigital macerations noted b/l LE. Toenails 1-5 b/l elongated, discolored, dystrophic, thickened, crumbly with subungual debris and tenderness to dorsal palpation. Porokeratotic lesion(s) plantar heel pad of both feet, submet head 3 b/l, and submet head 5 right foot. No erythema, no edema, no drainage, no fluctuance.  Musculoskeletal: Muscle strength 5/5 to all lower extremity muscle groups bilaterally. No pain, crepitus or joint limitation noted  with ROM bilateral LE. HAV with bunion deformity noted b/l LE. Hammertoe(s) noted to the bilateral 2nd toes.  Neurological: Protective sensation intact 5/5 intact bilaterally with 10g monofilament b/l. Vibratory sensation intact b/l. Proprioception intact bilaterally.  Assessment/Plan: 1. Pain due to onychomycosis of toenails of both feet   2. Porokeratosis   3. Clotting disorder ([place])     No orders of the defined types were placed in this encounter.  -Consent given for treatment as described below: -Examined patient. -Patient to continue soft, supportive shoe gear daily. -Toenails 1-5 b/l were debrided in length and girth with sterile nail nippers and dremel without iatrogenic bleeding.  -Porokeratotic lesion(s) plantar heel pad of both feet, submet head 3 left foot, and submet head 5 b/l pared and enucleated with sterile currette without incident. Total number of lesions debrided=5. -Patient/POA to call should there be question/concern in the interim.   Return in about 3 months (around 02/05/2022).  Marzetta Board, DPM

## 2021-11-17 DIAGNOSIS — M1611 Unilateral primary osteoarthritis, right hip: Secondary | ICD-10-CM | POA: Diagnosis not present

## 2021-11-17 DIAGNOSIS — M25551 Pain in right hip: Secondary | ICD-10-CM | POA: Diagnosis not present

## 2021-11-17 DIAGNOSIS — M1711 Unilateral primary osteoarthritis, right knee: Secondary | ICD-10-CM | POA: Diagnosis not present

## 2021-11-17 DIAGNOSIS — M25561 Pain in right knee: Secondary | ICD-10-CM | POA: Diagnosis not present

## 2021-11-17 LAB — CBC: RBC: 4.26 (ref 3.87–5.11)

## 2021-11-20 DIAGNOSIS — M25551 Pain in right hip: Secondary | ICD-10-CM | POA: Diagnosis not present

## 2021-11-20 LAB — CBC AND DIFFERENTIAL
HCT: 39 (ref 36–46)
Hemoglobin: 13 (ref 12.0–16.0)
Platelets: 282 10*3/uL (ref 150–400)
WBC: 7.7

## 2021-11-21 ENCOUNTER — Other Ambulatory Visit: Payer: Self-pay | Admitting: Internal Medicine

## 2021-12-01 ENCOUNTER — Encounter: Payer: Self-pay | Admitting: Internal Medicine

## 2021-12-03 DIAGNOSIS — M25561 Pain in right knee: Secondary | ICD-10-CM | POA: Diagnosis not present

## 2021-12-03 DIAGNOSIS — M25551 Pain in right hip: Secondary | ICD-10-CM | POA: Diagnosis not present

## 2021-12-08 ENCOUNTER — Other Ambulatory Visit: Payer: Self-pay | Admitting: Internal Medicine

## 2021-12-08 DIAGNOSIS — M25561 Pain in right knee: Secondary | ICD-10-CM | POA: Diagnosis not present

## 2021-12-08 DIAGNOSIS — Z1231 Encounter for screening mammogram for malignant neoplasm of breast: Secondary | ICD-10-CM

## 2021-12-30 ENCOUNTER — Other Ambulatory Visit: Payer: Self-pay | Admitting: Internal Medicine

## 2021-12-31 DIAGNOSIS — S83281D Other tear of lateral meniscus, current injury, right knee, subsequent encounter: Secondary | ICD-10-CM | POA: Diagnosis not present

## 2021-12-31 DIAGNOSIS — M1611 Unilateral primary osteoarthritis, right hip: Secondary | ICD-10-CM | POA: Diagnosis not present

## 2022-01-01 ENCOUNTER — Other Ambulatory Visit: Payer: Self-pay | Admitting: Endocrinology

## 2022-01-01 DIAGNOSIS — E890 Postprocedural hypothyroidism: Secondary | ICD-10-CM

## 2022-01-26 ENCOUNTER — Other Ambulatory Visit: Payer: Self-pay | Admitting: Internal Medicine

## 2022-01-27 ENCOUNTER — Telehealth: Payer: Self-pay | Admitting: Internal Medicine

## 2022-01-27 NOTE — Telephone Encounter (Signed)
LVM for to to rtn my call to schedule AWV-I with [facility] call back # [phone]

## 2022-01-29 ENCOUNTER — Ambulatory Visit
Admission: RE | Admit: 2022-01-29 | Discharge: 2022-01-29 | Disposition: A | Discharge status: Home / Self Care | Payer: Medicare Other | Source: Ambulatory Visit | Attending: Internal Medicine | Admitting: Internal Medicine

## 2022-01-29 DIAGNOSIS — Z1231 Encounter for screening mammogram for malignant neoplasm of breast: Secondary | ICD-10-CM | POA: Diagnosis not present

## 2022-02-01 ENCOUNTER — Telehealth: Payer: Self-pay | Admitting: Internal Medicine

## 2022-02-01 MED ORDER — RIVAROXABAN 20 MG PO TABS: ORAL_TABLET | ORAL | 0 refills | Status: DC

## 2022-02-01 NOTE — Telephone Encounter (Signed)
Patient called and wanted to know if she can get some xarelto samples. Callback is [phone]

## 2022-02-01 NOTE — Telephone Encounter (Signed)
Notified pt samples ready for pick-up.Marland KitchenJohny Larsen

## 2022-02-11 ENCOUNTER — Ambulatory Visit: Payer: Medicare Other | Admitting: Internal Medicine

## 2022-02-18 ENCOUNTER — Encounter: Payer: Self-pay | Admitting: Internal Medicine

## 2022-02-18 ENCOUNTER — Ambulatory Visit (INDEPENDENT_AMBULATORY_CARE_PROVIDER_SITE_OTHER): Payer: Medicare Other | Admitting: Internal Medicine

## 2022-02-18 VITALS — BP 120/64 | HR 90 | Temp 98.0°F | Ht 65.0 in | Wt 173.0 lb

## 2022-02-18 DIAGNOSIS — Z86711 Personal history of pulmonary embolism: Secondary | ICD-10-CM

## 2022-02-18 DIAGNOSIS — I10 Essential (primary) hypertension: Secondary | ICD-10-CM

## 2022-02-18 DIAGNOSIS — G8929 Other chronic pain: Secondary | ICD-10-CM | POA: Diagnosis not present

## 2022-02-18 DIAGNOSIS — M25561 Pain in right knee: Secondary | ICD-10-CM

## 2022-02-18 DIAGNOSIS — E559 Vitamin D deficiency, unspecified: Secondary | ICD-10-CM | POA: Diagnosis not present

## 2022-02-18 DIAGNOSIS — J4489 Other specified chronic obstructive pulmonary disease: Secondary | ICD-10-CM | POA: Diagnosis not present

## 2022-02-18 DIAGNOSIS — M25562 Pain in left knee: Secondary | ICD-10-CM | POA: Diagnosis not present

## 2022-02-18 MED ORDER — HYDROCODONE-ACETAMINOPHEN 5-325 MG PO TABS: 1.0000 | ORAL_TABLET | Freq: Three times a day (TID) | ORAL | 0 refills | Status: DC | PRN

## 2022-02-18 MED ORDER — RIVAROXABAN 20 MG PO TABS: 20.0000 mg | ORAL_TABLET | Freq: Every day | ORAL | 5 refills | Status: DC

## 2022-02-18 NOTE — Assessment & Plan Note (Signed)
R>>L F/u w/Dr Berenice Primas - TKR recommended RF (-), ANA (+) - Rhem appt pending Norco prn  Potential benefits of a long term opioids use as well as potential risks (i.e. addiction risk, apnea etc) and complications (i.e. Somnolence, constipation and others) were explained to the patient and were aknowledged. DMV form

## 2022-02-18 NOTE — Assessment & Plan Note (Signed)
Cont on Xarelto  

## 2022-02-18 NOTE — Assessment & Plan Note (Signed)
Cont on [employer]

## 2022-02-18 NOTE — Progress Notes (Signed)
Subjective:  Patient ID: Ashley Larsen, female    DOB: March 11, 1950  Age: 72 y.o. MRN: [id number]  CC: Follow-up   HPI Ashley Larsen presents for OA, HTN, COPD  Outpatient Medications Prior to Visit  Medication Sig Dispense Refill   amLODipine (NORVASC) 5 MG tablet TAKE 1 TABLET(5 MG) BY MOUTH DAILY 90 tablet 1   Artificial Tear Solution (GENTEAL TEARS) 0.1-0.2-0.3 % SOLN Place 1 drop into both eyes daily as needed (Dry eye).     Cholecalciferol (VITAMIN D3) 50 MCG (2000 UT) capsule Take 1 capsule (2,000 Units total) by mouth daily. 100 capsule 3   fexofenadine (ALLEGRA) 180 MG tablet Take 1 tablet (180 mg total) by mouth daily as needed for allergies. 90 tablet 3   fluticasone (FLONASE) 50 MCG/ACT nasal spray Place 2 sprays into both nostrils daily. (Patient taking differently: Place 2 sprays into both nostrils daily as needed for allergies or rhinitis.) 16 g 6   gabapentin (NEURONTIN) 300 MG capsule Take 1 capsule (300 mg total) by mouth at bedtime as needed. 30 capsule 3   levothyroxine (SYNTHROID) 100 MCG tablet TAKE 1 TABLET BY MOUTH BEFORE BREAKFAST 90 tablet 3   losartan (COZAAR) 100 MG tablet TAKE 1 TABLET(100 MG) BY MOUTH DAILY 90 tablet 3   potassium chloride (KLOR-CON) 10 MEQ tablet TAKE 1 TABLET(10 MEQ) BY MOUTH DAILY 90 tablet 1   HYDROcodone-acetaminophen (NORCO/VICODIN) 5-325 MG tablet Take 1 tablet by mouth every 8 (eight) hours as needed for severe pain. 90 tablet 0   rivaroxaban (XARELTO) 20 MG TABS tablet TAKE 1 TABLET BY MOUTH EVERY DAY WITH SUPPER 28 tablet 0   methylPREDNISolone (MEDROL DOSEPAK) 4 MG TBPK tablet As directed (Patient not taking: Reported on 02/18/2022) 21 tablet 0   No facility-administered medications prior to visit.    ROS: Review of Systems  Constitutional:  Negative for activity change, appetite change, chills, fatigue and unexpected weight change.  HENT:  Negative for congestion, mouth sores and sinus pressure.   Eyes:  Negative for visual  disturbance.  Respiratory:  Negative for cough and chest tightness.   Cardiovascular:  Negative for leg swelling.  Gastrointestinal:  Negative for abdominal pain and nausea.  Genitourinary:  Negative for difficulty urinating, frequency and vaginal pain.  Musculoskeletal:  Positive for arthralgias and gait problem. Negative for back pain.  Skin:  Negative for pallor and rash.  Neurological:  Negative for dizziness, tremors, weakness, numbness and headaches.  Psychiatric/Behavioral:  Negative for confusion, sleep disturbance and suicidal ideas. The patient is nervous/anxious.     Objective:  BP 120/64 (BP Location: Left Arm, Patient Position: Sitting, Cuff Size: Normal)   Pulse 90   Temp 98 F (36.7 C) (Oral)   Ht '5\' 5"'$  (1.651 m)   Wt 173 lb (78.5 kg)   SpO2 99%   BMI 28.79 kg/m   BP Readings from Last 3 Encounters:  02/18/22 120/64  11/09/21 122/68  10/27/21 (!) 151/84    Wt Readings from Last 3 Encounters:  02/18/22 173 lb (78.5 kg)  11/09/21 172 lb 6.4 oz (78.2 kg)  10/16/21 175 lb 12.8 oz (79.7 kg)    Physical Exam Constitutional:      General: She is not in acute distress.    Appearance: She is well-developed. She is obese.  HENT:     Head: Normocephalic.     Right Ear: External ear normal.     Left Ear: External ear normal.     Nose: Nose normal.  Eyes:  General:        Right eye: No discharge.        Left eye: No discharge.     Conjunctiva/sclera: Conjunctivae normal.     Pupils: Pupils are equal, round, and reactive to light.  Neck:     Thyroid: No thyromegaly.     Vascular: No JVD.     Trachea: No tracheal deviation.  Cardiovascular:     Rate and Rhythm: Normal rate and regular rhythm.     Heart sounds: Normal heart sounds.  Pulmonary:     Effort: No respiratory distress.     Breath sounds: No stridor. No wheezing.  Abdominal:     General: Bowel sounds are normal. There is no distension.     Palpations: Abdomen is soft. There is no mass.      Tenderness: There is no abdominal tenderness. There is no guarding or rebound.  Musculoskeletal:        General: Swelling and tenderness present.     Cervical back: Normal range of motion and neck supple. No rigidity.     Right lower leg: No edema.     Left lower leg: No edema.  Lymphadenopathy:     Cervical: No cervical adenopathy.  Skin:    Findings: No erythema or rash.  Neurological:     Cranial Nerves: No cranial nerve deficit.     Motor: No abnormal muscle tone.     Coordination: Coordination normal.     Deep Tendon Reflexes: Reflexes normal.  Psychiatric:        Behavior: Behavior normal.        Thought Content: Thought content normal.        Judgment: Judgment normal.   R knee is swollen w/pain  Lab Results  Component Value Date   WBC 7.7 11/20/2021   HGB 13.0 11/20/2021   HCT 39 11/20/2021   PLT 282 11/20/2021   GLUCOSE 88 10/27/2021   CHOL 249 (A) 06/11/2021   TRIG 109 06/11/2021   HDL 48 06/03/2020   LDLDIRECT 185.7 01/19/2012   LDLCALC 179 06/11/2021   ALT 33 10/27/2021   AST 27 10/27/2021   NA 140 10/27/2021   K 3.7 10/27/2021   CL 109 10/27/2021   CREATININE 0.86 10/27/2021   BUN 10 10/27/2021   CO2 26 10/27/2021   TSH 0.87 10/13/2021   INR 1.0 07/24/2019   HGBA1C 5.6 06/11/2021    MM 3D SCREEN BREAST BILATERAL  Result Date: 02/02/2022 CLINICAL DATA:  Screening. EXAM: DIGITAL SCREENING BILATERAL MAMMOGRAM WITH TOMOSYNTHESIS AND CAD TECHNIQUE: Bilateral screening digital craniocaudal and mediolateral oblique mammograms were obtained. Bilateral screening digital breast tomosynthesis was performed. The images were evaluated with computer-aided detection. COMPARISON:  Previous exam(s). ACR Breast Density Category c: The breast tissue is heterogeneously dense, which may obscure small masses. FINDINGS: There are no findings suspicious for malignancy. IMPRESSION: No mammographic evidence of malignancy. A result letter of this screening mammogram will be mailed  directly to the patient. RECOMMENDATION: Screening mammogram in one year. (Code:SM-B-01Y) BI-RADS CATEGORY  1: Negative. Electronically Signed   By: Franki Cabot M.D.   On: 02/02/2022 10:11    Assessment & Plan:   Problem List Items Addressed This Visit     Vitamin D deficiency - Primary    On Vit D      Knee pain, bilateral    R>>L F/u w/Dr Berenice Primas - TKR recommended RF (-), ANA (+) - Rhem appt pending Norco prn  Potential benefits of a long  term opioids use as well as potential risks (i.e. addiction risk, apnea etc) and complications (i.e. Somnolence, constipation and others) were explained to the patient and were aknowledged. DMV form      History of pulmonary embolism    Cont on Xarelto      Essential hypertension    Cont on Losartan, Norvasc      Relevant Medications   rivaroxaban (XARELTO) 20 MG TABS tablet   COPD with chronic bronchitis (HCC)    Cont on Breo         Meds ordered this encounter  Medications   HYDROcodone-acetaminophen (NORCO/VICODIN) 5-325 MG tablet    Sig: Take 1 tablet by mouth every 8 (eight) hours as needed for severe pain.    Dispense:  90 tablet    Refill:  0   DISCONTD: rivaroxaban (XARELTO) 20 MG TABS tablet    Sig: Take 1 tablet (20 mg total) by mouth daily.    Dispense:  30 tablet    Refill:  5    Lot# [id number] exp 11/2023   rivaroxaban (XARELTO) 20 MG TABS tablet    Sig: Take 1 tablet (20 mg total) by mouth daily.    Dispense:  30 tablet    Refill:  5      Follow-up: Return in about 3 months (around 05/20/2022) for a follow-up visit.  Walker Kehr, MD

## 2022-02-18 NOTE — Assessment & Plan Note (Signed)
On Vit D 

## 2022-02-18 NOTE — Assessment & Plan Note (Signed)
Cont on Losartan, Norvasc

## 2022-02-23 ENCOUNTER — Ambulatory Visit: Payer: Medicare Other | Admitting: Podiatry
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
## 2022-03-29 ENCOUNTER — Ambulatory Visit: Payer: Medicare Other | Attending: Internal Medicine | Admitting: Internal Medicine

## 2022-03-29 ENCOUNTER — Encounter: Payer: Self-pay | Admitting: Internal Medicine

## 2022-03-29 VITALS — BP 119/77 | HR 76 | Resp 12 | Ht 65.5 in | Wt 173.0 lb

## 2022-03-29 DIAGNOSIS — M25561 Pain in right knee: Secondary | ICD-10-CM | POA: Diagnosis not present

## 2022-03-29 DIAGNOSIS — G8929 Other chronic pain: Secondary | ICD-10-CM | POA: Diagnosis not present

## 2022-03-29 DIAGNOSIS — R768 Other specified abnormal immunological findings in serum: Secondary | ICD-10-CM | POA: Insufficient documentation

## 2022-03-29 DIAGNOSIS — M25562 Pain in left knee: Secondary | ICD-10-CM | POA: Diagnosis not present

## 2022-03-29 MED ORDER — LIDOCAINE HCL 1 % IJ SOLN
3.0000 mL | INTRAMUSCULAR | Status: AC | PRN
  Administered 2022-03-29: 3 mL

## 2022-03-29 MED ORDER — TRIAMCINOLONE ACETONIDE 40 MG/ML IJ SUSP
40.0000 mg | INTRAMUSCULAR | Status: AC | PRN
  Administered 2022-03-29: 40 mg via INTRA_ARTICULAR

## 2022-03-30 LAB — TIQ-NTM

## 2022-03-30 LAB — SYNOVIAL FLUID ANALYSIS, COMPLETE
Basophils, %: 0 %
Eosinophils-Synovial: 0 % (ref 0–2)
Lymphocytes-Synovial Fld: 60 % (ref 0–74)
Monocyte/Macrophage: 40 % (ref 0–69)
Neutrophil, Synovial: 0 % (ref 0–24)
Synoviocytes, %: 0 % (ref 0–15)
WBC, Synovial: 198 cells/uL — ABNORMAL HIGH (ref <150)

## 2022-03-30 NOTE — Progress Notes (Signed)
Does this need anything else sent?

## 2022-03-30 NOTE — Progress Notes (Signed)
The knee fluid shows no increase in white blood cells. This looks like swelling related to the osteoarthritis damage in the knee and not a separate inflammation problem. I recommend she follow up with Dr. Berenice Primas for ongoing management with more of an osteoarthritis problem.

## 2022-03-30 NOTE — Progress Notes (Signed)
Nothing else needed. [facility] was not in the office, extra lavender tube sent.

## 2022-05-03 ENCOUNTER — Telehealth: Payer: Self-pay | Admitting: Internal Medicine

## 2022-05-03 NOTE — Telephone Encounter (Signed)
Patient dropped off document Surgical Clearance, to be filled out by provider. Patient requested to send it via Fax within ASAP. Document is located in providers tray at front office.Please advise at Mobile [phone] (mobile) .

## 2022-05-04 NOTE — Telephone Encounter (Signed)
Faxed order back to [facility].Marland KitchenJohny Larsen

## 2022-05-04 NOTE — Telephone Encounter (Signed)
Done. Thank you.

## 2022-05-05 ENCOUNTER — Telehealth: Payer: Self-pay | Admitting: Internal Medicine

## 2022-05-05 DIAGNOSIS — X58XXXA Exposure to other specified factors, initial encounter: Secondary | ICD-10-CM | POA: Diagnosis not present

## 2022-05-05 DIAGNOSIS — M94261 Chondromalacia, right knee: Secondary | ICD-10-CM | POA: Diagnosis not present

## 2022-05-05 DIAGNOSIS — Y999 Unspecified external cause status: Secondary | ICD-10-CM | POA: Diagnosis not present

## 2022-05-05 DIAGNOSIS — M6751 Plica syndrome, right knee: Secondary | ICD-10-CM | POA: Diagnosis not present

## 2022-05-05 DIAGNOSIS — S83271A Complex tear of lateral meniscus, current injury, right knee, initial encounter: Secondary | ICD-10-CM | POA: Diagnosis not present

## 2022-05-05 DIAGNOSIS — G8918 Other acute postprocedural pain: Secondary | ICD-10-CM | POA: Diagnosis not present

## 2022-05-05 DIAGNOSIS — M2341 Loose body in knee, right knee: Secondary | ICD-10-CM | POA: Diagnosis not present

## 2022-05-05 NOTE — Telephone Encounter (Signed)
Called patient to schedule Medicare Annual Wellness Visit (AWV). Left message for patient to call back and schedule Medicare Annual Wellness Visit (AWV).  Last date of AWV: * due 07/17/18 awvi  Please schedule an appointment at any time with [facility] .  If any questions, please contact me at [phone].  Thank you ,  Barkley Boards AWV direct phone # [phone]

## 2022-05-11 DIAGNOSIS — S83261D Peripheral tear of lateral meniscus, current injury, right knee, subsequent encounter: Secondary | ICD-10-CM | POA: Diagnosis not present

## 2022-05-11 DIAGNOSIS — M25661 Stiffness of right knee, not elsewhere classified: Secondary | ICD-10-CM | POA: Diagnosis not present

## 2022-05-13 DIAGNOSIS — S83261D Peripheral tear of lateral meniscus, current injury, right knee, subsequent encounter: Secondary | ICD-10-CM | POA: Diagnosis not present

## 2022-05-13 DIAGNOSIS — M25661 Stiffness of right knee, not elsewhere classified: Secondary | ICD-10-CM | POA: Diagnosis not present

## 2022-05-13 DIAGNOSIS — S83281A Other tear of lateral meniscus, current injury, right knee, initial encounter: Secondary | ICD-10-CM | POA: Diagnosis not present

## 2022-05-20 ENCOUNTER — Ambulatory Visit: Payer: Medicare Other | Admitting: Internal Medicine

## 2022-05-20 ENCOUNTER — Encounter: Payer: Self-pay | Admitting: Internal Medicine

## 2022-05-20 ENCOUNTER — Ambulatory Visit (INDEPENDENT_AMBULATORY_CARE_PROVIDER_SITE_OTHER): Payer: Medicare Other | Admitting: Internal Medicine

## 2022-05-20 VITALS — BP 120/78 | HR 81 | Temp 98.6°F | Ht 65.5 in | Wt 172.0 lb

## 2022-05-20 DIAGNOSIS — M5440 Lumbago with sciatica, unspecified side: Secondary | ICD-10-CM

## 2022-05-20 DIAGNOSIS — G8929 Other chronic pain: Secondary | ICD-10-CM

## 2022-05-20 DIAGNOSIS — I10 Essential (primary) hypertension: Secondary | ICD-10-CM | POA: Diagnosis not present

## 2022-05-20 DIAGNOSIS — Z860100 Personal history of colon polyps, unspecified: Secondary | ICD-10-CM

## 2022-05-20 DIAGNOSIS — E559 Vitamin D deficiency, unspecified: Secondary | ICD-10-CM

## 2022-05-20 DIAGNOSIS — M25562 Pain in left knee: Secondary | ICD-10-CM | POA: Diagnosis not present

## 2022-05-20 DIAGNOSIS — M5442 Lumbago with sciatica, left side: Secondary | ICD-10-CM

## 2022-05-20 DIAGNOSIS — M25561 Pain in right knee: Secondary | ICD-10-CM | POA: Diagnosis not present

## 2022-05-20 MED ORDER — AMLODIPINE BESYLATE 5 MG PO TABS: ORAL_TABLET | ORAL | 2 refills | Status: DC

## 2022-05-20 MED ORDER — HYDROCODONE-ACETAMINOPHEN 5-325 MG PO TABS: 1.0000 | ORAL_TABLET | Freq: Three times a day (TID) | ORAL | 0 refills | Status: DC | PRN

## 2022-05-20 NOTE — Assessment & Plan Note (Signed)
Cont on Norco prn ° Potential benefits of a long term opioids use as well as potential risks (i.e. addiction risk, apnea etc) and complications (i.e. Somnolence, constipation and others) were explained to the patient and were aknowledged. °

## 2022-05-20 NOTE — Progress Notes (Signed)
Subjective:  Patient ID: Ashley Larsen, female    DOB: 05-25-1950  Age: 72 y.o. MRN: [id number]  CC: Follow-up (3 mnth f/u, Pt is having trouble having bowel movements since her Surgery)   HPI Ashley Larsen presents for chronic pain F/u on colon polyps C/o R knee pain   Outpatient Medications Prior to Visit  Medication Sig Dispense Refill   Artificial Tear Solution (GENTEAL TEARS) 0.1-0.2-0.3 % SOLN Place 1 drop into both eyes daily as needed (Dry eye).     Cholecalciferol (VITAMIN D3) 50 MCG (2000 UT) capsule Take 1 capsule (2,000 Units total) by mouth daily. 100 capsule 3   fexofenadine (ALLEGRA) 180 MG tablet Take 1 tablet (180 mg total) by mouth daily as needed for allergies. 90 tablet 3   fluticasone (FLONASE) 50 MCG/ACT nasal spray Place 2 sprays into both nostrils daily. (Patient taking differently: Place 2 sprays into both nostrils daily as needed for allergies or rhinitis.) 16 g 6   gabapentin (NEURONTIN) 300 MG capsule Take 1 capsule (300 mg total) by mouth at bedtime as needed. 30 capsule 3   levothyroxine (SYNTHROID) 100 MCG tablet TAKE 1 TABLET BY MOUTH BEFORE BREAKFAST 90 tablet 3   losartan (COZAAR) 100 MG tablet TAKE 1 TABLET(100 MG) BY MOUTH DAILY 90 tablet 3   methylPREDNISolone (MEDROL DOSEPAK) 4 MG TBPK tablet As directed 21 tablet 0   potassium chloride (KLOR-CON) 10 MEQ tablet TAKE 1 TABLET(10 MEQ) BY MOUTH DAILY 90 tablet 1   rivaroxaban (XARELTO) 20 MG TABS tablet Take 1 tablet (20 mg total) by mouth daily. 30 tablet 5   amLODipine (NORVASC) 5 MG tablet TAKE 1 TABLET(5 MG) BY MOUTH DAILY 90 tablet 1   HYDROcodone-acetaminophen (NORCO/VICODIN) 5-325 MG tablet Take 1 tablet by mouth every 8 (eight) hours as needed for severe pain. 90 tablet 0   No facility-administered medications prior to visit.    ROS: Review of Systems  Constitutional:  Negative for activity change, appetite change, chills, fatigue and unexpected weight change.  HENT:  Negative for  congestion, mouth sores and sinus pressure.   Eyes:  Negative for visual disturbance.  Respiratory:  Negative for cough and chest tightness.   Gastrointestinal:  Negative for abdominal pain and nausea.  Genitourinary:  Negative for difficulty urinating, frequency and vaginal pain.  Musculoskeletal:  Positive for arthralgias, back pain and gait problem.  Skin:  Negative for pallor and rash.  Neurological:  Negative for dizziness, tremors, weakness, numbness and headaches.  Psychiatric/Behavioral:  Negative for confusion and sleep disturbance.     Objective:  BP 120/78 (BP Location: Left Arm, Patient Position: Sitting, Cuff Size: Normal)   Pulse 81   Temp 98.6 F (37 C) (Oral)   Ht 5' 5.5" (1.664 m)   Wt 172 lb (78 kg)   SpO2 95%   BMI 28.19 kg/m   BP Readings from Last 3 Encounters:  05/20/22 120/78  03/29/22 119/77  02/18/22 120/64    Wt Readings from Last 3 Encounters:  05/20/22 172 lb (78 kg)  03/29/22 173 lb (78.5 kg)  02/18/22 173 lb (78.5 kg)    Physical Exam Constitutional:      General: She is not in acute distress.    Appearance: She is well-developed. She is obese.  HENT:     Head: Normocephalic.     Right Ear: External ear normal.     Left Ear: External ear normal.     Nose: Nose normal.  Eyes:     General:  Right eye: No discharge.        Left eye: No discharge.     Conjunctiva/sclera: Conjunctivae normal.     Pupils: Pupils are equal, round, and reactive to light.  Neck:     Thyroid: No thyromegaly.     Vascular: No JVD.     Trachea: No tracheal deviation.  Cardiovascular:     Rate and Rhythm: Normal rate and regular rhythm.     Heart sounds: Normal heart sounds.  Pulmonary:     Effort: No respiratory distress.     Breath sounds: No stridor. No wheezing.  Abdominal:     General: Bowel sounds are normal. There is no distension.     Palpations: Abdomen is soft. There is no mass.     Tenderness: There is no abdominal tenderness. There is no  guarding or rebound.  Musculoskeletal:        General: No tenderness.     Cervical back: Normal range of motion and neck supple. No rigidity.  Lymphadenopathy:     Cervical: No cervical adenopathy.  Skin:    Findings: No erythema or rash.  Neurological:     Mental Status: She is oriented to person, place, and time.     Cranial Nerves: No cranial nerve deficit.     Motor: No abnormal muscle tone.     Coordination: Coordination normal.     Gait: Gait abnormal.     Deep Tendon Reflexes: Reflexes normal.  Psychiatric:        Behavior: Behavior normal.        Thought Content: Thought content normal.        Judgment: Judgment normal.    R knee w/post-op swelling, 2 scars Using a crutch  Lab Results  Component Value Date   WBC 7.7 11/20/2021   HGB 13.0 11/20/2021   HCT 39 11/20/2021   PLT 282 11/20/2021   GLUCOSE 88 10/27/2021   CHOL 249 (A) 06/11/2021   TRIG 109 06/11/2021   HDL 48 06/03/2020   LDLDIRECT 185.7 01/19/2012   LDLCALC 179 06/11/2021   ALT 33 10/27/2021   AST 27 10/27/2021   NA 140 10/27/2021   K 3.7 10/27/2021   CL 109 10/27/2021   CREATININE 0.86 10/27/2021   BUN 10 10/27/2021   CO2 26 10/27/2021   TSH 0.87 10/13/2021   INR 1.0 07/24/2019   HGBA1C 5.6 06/11/2021    MM 3D SCREEN BREAST BILATERAL  Result Date: 02/02/2022 CLINICAL DATA:  Screening. EXAM: DIGITAL SCREENING BILATERAL MAMMOGRAM WITH TOMOSYNTHESIS AND CAD TECHNIQUE: Bilateral screening digital craniocaudal and mediolateral oblique mammograms were obtained. Bilateral screening digital breast tomosynthesis was performed. The images were evaluated with computer-aided detection. COMPARISON:  Previous exam(s). ACR Breast Density Category c: The breast tissue is heterogeneously dense, which may obscure small masses. FINDINGS: There are no findings suspicious for malignancy. IMPRESSION: No mammographic evidence of malignancy. A result letter of this screening mammogram will be mailed directly to the  patient. RECOMMENDATION: Screening mammogram in one year. (Code:SM-B-01Y) BI-RADS CATEGORY  1: Negative. Electronically Signed   By: Bary Richard M.D.   On: 02/02/2022 10:11    Assessment & Plan:   Problem List Items Addressed This Visit       Cardiovascular and Mediastinum   Essential hypertension - Primary    D/c'd  maxzide due to low K Cont on Losartan, Norvasc      Relevant Medications   amLODipine (NORVASC) 5 MG tablet     Other   History  of colonic polyps   Relevant Orders   Ambulatory referral to Gastroenterology   Knee pain, bilateral     R knee w/post-op swelling, 2 scars      Low back pain    Cont on Norco prn  Potential benefits of a long term opioids use as well as potential risks (i.e. addiction risk, apnea etc) and complications (i.e. Somnolence, constipation and others) were explained to the patient and were aknowledged.       Relevant Medications   HYDROcodone-acetaminophen (NORCO/VICODIN) 5-325 MG tablet   Vitamin D deficiency    Cont w/Vit D         Meds ordered this encounter  Medications   amLODipine (NORVASC) 5 MG tablet    Sig: TAKE 1 TABLET(5 MG) BY MOUTH DAILY    Dispense:  90 tablet    Refill:  2   HYDROcodone-acetaminophen (NORCO/VICODIN) 5-325 MG tablet    Sig: Take 1 tablet by mouth every 8 (eight) hours as needed for severe pain.    Dispense:  90 tablet    Refill:  0      Follow-up: Return in about 3 months (around 08/19/2022) for a follow-up visit.  Sonda Primes, MD

## 2022-05-20 NOTE — Assessment & Plan Note (Signed)
  R knee w/post-op swelling, 2 scars

## 2022-05-20 NOTE — Assessment & Plan Note (Signed)
Cont w/Vit D 

## 2022-05-20 NOTE — Assessment & Plan Note (Signed)
D/c'd  maxzide due to low K Cont on Losartan, Norvasc

## 2022-05-21 DIAGNOSIS — M25661 Stiffness of right knee, not elsewhere classified: Secondary | ICD-10-CM | POA: Diagnosis not present

## 2022-05-21 DIAGNOSIS — S83261D Peripheral tear of lateral meniscus, current injury, right knee, subsequent encounter: Secondary | ICD-10-CM | POA: Diagnosis not present

## 2022-05-26 DIAGNOSIS — M25661 Stiffness of right knee, not elsewhere classified: Secondary | ICD-10-CM | POA: Diagnosis not present

## 2022-05-26 DIAGNOSIS — S83261D Peripheral tear of lateral meniscus, current injury, right knee, subsequent encounter: Secondary | ICD-10-CM | POA: Diagnosis not present

## 2022-05-31 DIAGNOSIS — S83261D Peripheral tear of lateral meniscus, current injury, right knee, subsequent encounter: Secondary | ICD-10-CM | POA: Diagnosis not present

## 2022-05-31 DIAGNOSIS — M25661 Stiffness of right knee, not elsewhere classified: Secondary | ICD-10-CM | POA: Diagnosis not present

## 2022-06-03 DIAGNOSIS — M25661 Stiffness of right knee, not elsewhere classified: Secondary | ICD-10-CM | POA: Diagnosis not present

## 2022-06-03 DIAGNOSIS — S83261D Peripheral tear of lateral meniscus, current injury, right knee, subsequent encounter: Secondary | ICD-10-CM | POA: Diagnosis not present

## 2022-06-09 DIAGNOSIS — M25661 Stiffness of right knee, not elsewhere classified: Secondary | ICD-10-CM | POA: Diagnosis not present

## 2022-06-09 DIAGNOSIS — S83261D Peripheral tear of lateral meniscus, current injury, right knee, subsequent encounter: Secondary | ICD-10-CM | POA: Diagnosis not present

## 2022-06-11 DIAGNOSIS — M25661 Stiffness of right knee, not elsewhere classified: Secondary | ICD-10-CM | POA: Diagnosis not present

## 2022-06-11 DIAGNOSIS — S83261D Peripheral tear of lateral meniscus, current injury, right knee, subsequent encounter: Secondary | ICD-10-CM | POA: Diagnosis not present

## 2022-06-14 DIAGNOSIS — S83261D Peripheral tear of lateral meniscus, current injury, right knee, subsequent encounter: Secondary | ICD-10-CM | POA: Diagnosis not present

## 2022-06-14 DIAGNOSIS — M25661 Stiffness of right knee, not elsewhere classified: Secondary | ICD-10-CM | POA: Diagnosis not present

## 2022-06-15 DIAGNOSIS — M25561 Pain in right knee: Secondary | ICD-10-CM | POA: Diagnosis not present

## 2022-06-15 DIAGNOSIS — M25461 Effusion, right knee: Secondary | ICD-10-CM | POA: Diagnosis not present

## 2022-06-15 DIAGNOSIS — Z9889 Other specified postprocedural states: Secondary | ICD-10-CM | POA: Diagnosis not present

## 2022-06-16 ENCOUNTER — Encounter: Payer: Self-pay | Admitting: Gastroenterology

## 2022-06-16 DIAGNOSIS — M25661 Stiffness of right knee, not elsewhere classified: Secondary | ICD-10-CM | POA: Diagnosis not present

## 2022-06-16 DIAGNOSIS — S83261D Peripheral tear of lateral meniscus, current injury, right knee, subsequent encounter: Secondary | ICD-10-CM | POA: Diagnosis not present

## 2022-06-21 DIAGNOSIS — M25661 Stiffness of right knee, not elsewhere classified: Secondary | ICD-10-CM | POA: Diagnosis not present

## 2022-06-21 DIAGNOSIS — S83261D Peripheral tear of lateral meniscus, current injury, right knee, subsequent encounter: Secondary | ICD-10-CM | POA: Diagnosis not present

## 2022-06-22 ENCOUNTER — Telehealth: Payer: Self-pay | Admitting: Internal Medicine

## 2022-06-22 MED ORDER — RIVAROXABAN 20 MG PO TABS: 20.0000 mg | ORAL_TABLET | Freq: Every day | ORAL | 0 refills | Status: DC

## 2022-06-22 NOTE — Telephone Encounter (Signed)
Notified pt we have samples of the xarelto. Gave her 4 bottles (L) [id number] Exp 11/16/23.Marland KitchenRaechel Chute

## 2022-06-22 NOTE — Telephone Encounter (Signed)
Patient would like to know if she can get any samples of Xarelto 20 mg. She would like a call back at [phone].

## 2022-06-23 DIAGNOSIS — M25661 Stiffness of right knee, not elsewhere classified: Secondary | ICD-10-CM | POA: Diagnosis not present

## 2022-06-23 DIAGNOSIS — S83261D Peripheral tear of lateral meniscus, current injury, right knee, subsequent encounter: Secondary | ICD-10-CM | POA: Diagnosis not present

## 2022-06-29 DIAGNOSIS — M25661 Stiffness of right knee, not elsewhere classified: Secondary | ICD-10-CM | POA: Diagnosis not present

## 2022-06-29 DIAGNOSIS — S83261D Peripheral tear of lateral meniscus, current injury, right knee, subsequent encounter: Secondary | ICD-10-CM | POA: Diagnosis not present

## 2022-07-01 DIAGNOSIS — M25661 Stiffness of right knee, not elsewhere classified: Secondary | ICD-10-CM | POA: Diagnosis not present

## 2022-07-01 DIAGNOSIS — S83261D Peripheral tear of lateral meniscus, current injury, right knee, subsequent encounter: Secondary | ICD-10-CM | POA: Diagnosis not present

## 2022-07-07 DIAGNOSIS — M25661 Stiffness of right knee, not elsewhere classified: Secondary | ICD-10-CM | POA: Diagnosis not present

## 2022-07-07 DIAGNOSIS — S83261D Peripheral tear of lateral meniscus, current injury, right knee, subsequent encounter: Secondary | ICD-10-CM | POA: Diagnosis not present

## 2022-07-09 DIAGNOSIS — M25661 Stiffness of right knee, not elsewhere classified: Secondary | ICD-10-CM | POA: Diagnosis not present

## 2022-07-09 DIAGNOSIS — S83261D Peripheral tear of lateral meniscus, current injury, right knee, subsequent encounter: Secondary | ICD-10-CM | POA: Diagnosis not present

## 2022-07-13 DIAGNOSIS — M25561 Pain in right knee: Secondary | ICD-10-CM | POA: Diagnosis not present

## 2022-07-14 DIAGNOSIS — M25661 Stiffness of right knee, not elsewhere classified: Secondary | ICD-10-CM | POA: Diagnosis not present

## 2022-07-14 DIAGNOSIS — S83261D Peripheral tear of lateral meniscus, current injury, right knee, subsequent encounter: Secondary | ICD-10-CM | POA: Diagnosis not present

## 2022-07-16 DIAGNOSIS — M25661 Stiffness of right knee, not elsewhere classified: Secondary | ICD-10-CM | POA: Diagnosis not present

## 2022-07-16 DIAGNOSIS — S83261D Peripheral tear of lateral meniscus, current injury, right knee, subsequent encounter: Secondary | ICD-10-CM | POA: Diagnosis not present

## 2022-07-19 DIAGNOSIS — S83261D Peripheral tear of lateral meniscus, current injury, right knee, subsequent encounter: Secondary | ICD-10-CM | POA: Diagnosis not present

## 2022-07-19 DIAGNOSIS — M25661 Stiffness of right knee, not elsewhere classified: Secondary | ICD-10-CM | POA: Diagnosis not present

## 2022-07-20 ENCOUNTER — Other Ambulatory Visit: Payer: Self-pay | Admitting: Internal Medicine

## 2022-07-21 DIAGNOSIS — M25661 Stiffness of right knee, not elsewhere classified: Secondary | ICD-10-CM | POA: Diagnosis not present

## 2022-07-21 DIAGNOSIS — S83261D Peripheral tear of lateral meniscus, current injury, right knee, subsequent encounter: Secondary | ICD-10-CM | POA: Diagnosis not present

## 2022-07-27 DIAGNOSIS — S83261D Peripheral tear of lateral meniscus, current injury, right knee, subsequent encounter: Secondary | ICD-10-CM | POA: Diagnosis not present

## 2022-07-27 DIAGNOSIS — M25661 Stiffness of right knee, not elsewhere classified: Secondary | ICD-10-CM | POA: Diagnosis not present

## 2022-07-29 DIAGNOSIS — M25661 Stiffness of right knee, not elsewhere classified: Secondary | ICD-10-CM | POA: Diagnosis not present

## 2022-07-29 DIAGNOSIS — S83261D Peripheral tear of lateral meniscus, current injury, right knee, subsequent encounter: Secondary | ICD-10-CM | POA: Diagnosis not present

## 2022-08-06 ENCOUNTER — Telehealth: Payer: Self-pay | Admitting: Gastroenterology

## 2022-08-06 DIAGNOSIS — M25661 Stiffness of right knee, not elsewhere classified: Secondary | ICD-10-CM | POA: Diagnosis not present

## 2022-08-06 DIAGNOSIS — S83261D Peripheral tear of lateral meniscus, current injury, right knee, subsequent encounter: Secondary | ICD-10-CM | POA: Diagnosis not present

## 2022-08-06 NOTE — Telephone Encounter (Signed)
If patients are on an anticoagulant or an antiplatelet (other than ASA) they need an office visit prior to scheduling a procedure.

## 2022-08-06 NOTE — Telephone Encounter (Signed)
Good Afternoon Dr Russella Dar  I received a call from this patient to schedule her colonoscopy, she is on Xarelto so due to protocol I scheduled her for an office visit. I had recently sent her a letter for her to call and schedule direct based off how the recall was signed. Just wanting to make sure what I scheduled was appropriate. Recall is scanned under media tab, please advise  Thank You!

## 2022-08-10 ENCOUNTER — Telehealth: Payer: Self-pay | Admitting: Internal Medicine

## 2022-08-10 DIAGNOSIS — M25561 Pain in right knee: Secondary | ICD-10-CM | POA: Diagnosis not present

## 2022-08-10 NOTE — Telephone Encounter (Signed)
Prescription Request  08/10/2022  LOV: 05/20/2022  What is the name of the medication or equipment? hydrocodone  Have you contacted your pharmacy to request a refill? Yes   Which pharmacy would you like this sent to?  [id number] [place], [place] - [address] AT [facility] [address] [place] [address] Phone: [phone] Fax: [phone]   Patient notified that their request is being sent to the clinical staff for review and that they should receive a response within 2 business days.   Please advise at Mobile [phone] (mobile)

## 2022-08-15 MED ORDER — HYDROCODONE-ACETAMINOPHEN 5-325 MG PO TABS: 1.0000 | ORAL_TABLET | Freq: Three times a day (TID) | ORAL | 0 refills | Status: DC | PRN

## 2022-08-15 NOTE — Telephone Encounter (Signed)
Okay.  Thanks.

## 2022-08-16 NOTE — Telephone Encounter (Signed)
MD sent refill to [facility] 

## 2022-08-23 ENCOUNTER — Ambulatory Visit (INDEPENDENT_AMBULATORY_CARE_PROVIDER_SITE_OTHER): Payer: Medicare Other | Admitting: Internal Medicine

## 2022-08-23 ENCOUNTER — Encounter: Payer: Self-pay | Admitting: Internal Medicine

## 2022-08-23 VITALS — BP 118/78 | HR 85 | Temp 98.2°F | Ht 65.5 in | Wt 170.0 lb

## 2022-08-23 DIAGNOSIS — G8929 Other chronic pain: Secondary | ICD-10-CM | POA: Diagnosis not present

## 2022-08-23 DIAGNOSIS — I10 Essential (primary) hypertension: Secondary | ICD-10-CM | POA: Diagnosis not present

## 2022-08-23 DIAGNOSIS — M25561 Pain in right knee: Secondary | ICD-10-CM

## 2022-08-23 DIAGNOSIS — M25562 Pain in left knee: Secondary | ICD-10-CM

## 2022-08-23 DIAGNOSIS — J0101 Acute recurrent maxillary sinusitis: Secondary | ICD-10-CM | POA: Diagnosis not present

## 2022-08-23 MED ORDER — AMOXICILLIN 500 MG PO CAPS: 500.0000 mg | ORAL_CAPSULE | Freq: Three times a day (TID) | ORAL | 0 refills | Status: AC

## 2022-08-23 MED ORDER — FLUCONAZOLE 150 MG PO TABS: 150.0000 mg | ORAL_TABLET | Freq: Once | ORAL | 1 refills | Status: DC

## 2022-08-23 NOTE — Assessment & Plan Note (Signed)
Norco prn  Potential benefits of a long term opioids use as well as potential risks (i.e. addiction risk, apnea etc) and complications (i.e. Somnolence, constipation and others) were explained to the patient and were aknowledged. DMV form

## 2022-08-23 NOTE — Assessment & Plan Note (Signed)
New Amoxicillin x 10 d Diflucan po prn

## 2022-08-23 NOTE — Progress Notes (Signed)
Subjective:  Patient ID: Ashley Larsen, female    DOB: [birth year]  Age: 72 y.o. MRN: [id number]  CC: Follow-up (6 mnth f/u)   HPI Ashley Larsen presents for allergies C/o R ear pain F/u knee pain  Outpatient Medications Prior to Visit  Medication Sig Dispense Refill   amLODipine (NORVASC) 5 MG tablet TAKE 1 TABLET(5 MG) BY MOUTH DAILY 90 tablet 2   Artificial Tear Solution (GENTEAL TEARS) 0.1-0.2-0.3 % SOLN Place 1 drop into both eyes daily as needed (Dry eye).     Cholecalciferol (VITAMIN D3) 50 MCG (2000 UT) capsule Take 1 capsule (2,000 Units total) by mouth daily. 100 capsule 3   fexofenadine (ALLEGRA) 180 MG tablet Take 1 tablet (180 mg total) by mouth daily as needed for allergies. 90 tablet 3   fluticasone (FLONASE) 50 MCG/ACT nasal spray Place 2 sprays into both nostrils daily. (Patient taking differently: Place 2 sprays into both nostrils daily as needed for allergies or rhinitis.) 16 g 6   gabapentin (NEURONTIN) 300 MG capsule Take 1 capsule (300 mg total) by mouth at bedtime as needed. 30 capsule 3   HYDROcodone-acetaminophen (NORCO/VICODIN) 5-325 MG tablet Take 1 tablet by mouth every 8 (eight) hours as needed for severe pain. 90 tablet 0   levothyroxine (SYNTHROID) 100 MCG tablet TAKE 1 TABLET BY MOUTH BEFORE BREAKFAST 90 tablet 3   losartan (COZAAR) 100 MG tablet TAKE 1 TABLET(100 MG) BY MOUTH DAILY 90 tablet 3   methylPREDNISolone (MEDROL DOSEPAK) 4 MG TBPK tablet As directed 21 tablet 0   potassium chloride (KLOR-CON) 10 MEQ tablet TAKE 1 TABLET(10 MEQ) BY MOUTH DAILY 90 tablet 1   rivaroxaban (XARELTO) 20 MG TABS tablet Take 1 tablet (20 mg total) by mouth daily. Lot# [id number] Exp 11/16/23 28 tablet 0   No facility-administered medications prior to visit.    ROS: Review of Systems  Constitutional:  Positive for fatigue. Negative for activity change, appetite change, chills and unexpected weight change.  HENT:  Positive for congestion, ear pain, postnasal drip and  sinus pain. Negative for mouth sores and sinus pressure.   Eyes:  Negative for visual disturbance.  Respiratory:  Negative for cough and chest tightness.   Gastrointestinal:  Negative for abdominal pain and nausea.  Genitourinary:  Negative for difficulty urinating, frequency and vaginal pain.  Musculoskeletal:  Positive for arthralgias and back pain. Negative for gait problem.  Skin:  Negative for pallor and rash.  Neurological:  Negative for dizziness, tremors, weakness, numbness and headaches.  Psychiatric/Behavioral:  Negative for confusion and sleep disturbance.     Objective:  BP 118/78 (BP Location: Right Arm, Patient Position: Sitting, Cuff Size: Large)   Pulse 85   Temp 98.2 F (36.8 C) (Oral)   Ht 5' 5.5" (1.664 m)   Wt 170 lb (77.1 kg)   SpO2 98%   BMI 27.86 kg/m   BP Readings from Last 3 Encounters:  08/23/22 118/78  05/20/22 120/78  03/29/22 119/77    Wt Readings from Last 3 Encounters:  08/23/22 170 lb (77.1 kg)  05/20/22 172 lb (78 kg)  03/29/22 173 lb (78.5 kg)    Physical Exam Constitutional:      General: She is not in acute distress.    Appearance: Normal appearance. She is well-developed.  HENT:     Head: Normocephalic.     Right Ear: External ear normal.     Left Ear: External ear normal.     Nose: Nose normal.  Eyes:  General:        Right eye: No discharge.        Left eye: No discharge.     Conjunctiva/sclera: Conjunctivae normal.     Pupils: Pupils are equal, round, and reactive to light.  Neck:     Thyroid: No thyromegaly.     Vascular: No JVD.     Trachea: No tracheal deviation.  Cardiovascular:     Rate and Rhythm: Normal rate and regular rhythm.     Heart sounds: Normal heart sounds.  Pulmonary:     Effort: No respiratory distress.     Breath sounds: No stridor. No wheezing.  Abdominal:     General: Bowel sounds are normal. There is no distension.     Palpations: Abdomen is soft. There is no mass.     Tenderness: There is  no abdominal tenderness. There is no guarding or rebound.  Musculoskeletal:        General: No tenderness.     Cervical back: Normal range of motion and neck supple. No rigidity.  Lymphadenopathy:     Cervical: No cervical adenopathy.  Skin:    Findings: No erythema or rash.  Neurological:     Mental Status: She is oriented to person, place, and time.     Cranial Nerves: No cranial nerve deficit.     Motor: No abnormal muscle tone.     Coordination: Coordination normal.     Gait: Gait abnormal.     Deep Tendon Reflexes: Reflexes normal.  Psychiatric:        Behavior: Behavior normal.        Thought Content: Thought content normal.        Judgment: Judgment normal.    Antalgic gait   Lab Results  Component Value Date   WBC 7.7 11/20/2021   HGB 13.0 11/20/2021   HCT 39 11/20/2021   PLT 282 11/20/2021   GLUCOSE 88 10/27/2021   CHOL 249 (A) 06/11/2021   TRIG 109 06/11/2021   HDL 48 06/03/2020   LDLDIRECT 185.7 01/19/2012   LDLCALC 179 06/11/2021   ALT 33 10/27/2021   AST 27 10/27/2021   NA 140 10/27/2021   K 3.7 10/27/2021   CL 109 10/27/2021   CREATININE 0.86 10/27/2021   BUN 10 10/27/2021   CO2 26 10/27/2021   TSH 0.87 10/13/2021   INR 1.0 07/24/2019   HGBA1C 5.6 06/11/2021    MM 3D SCREEN BREAST BILATERAL  Result Date: 02/02/2022 CLINICAL DATA:  Screening. EXAM: DIGITAL SCREENING BILATERAL MAMMOGRAM WITH TOMOSYNTHESIS AND CAD TECHNIQUE: Bilateral screening digital craniocaudal and mediolateral oblique mammograms were obtained. Bilateral screening digital breast tomosynthesis was performed. The images were evaluated with computer-aided detection. COMPARISON:  Previous exam(s). ACR Breast Density Category c: The breast tissue is heterogeneously dense, which may obscure small masses. FINDINGS: There are no findings suspicious for malignancy. IMPRESSION: No mammographic evidence of malignancy. A result letter of this screening mammogram will be mailed directly to the  patient. RECOMMENDATION: Screening mammogram in one year. (Code:SM-B-01Y) BI-RADS CATEGORY  1: Negative. Electronically Signed   By: Bary Richard M.D.   On: 02/02/2022 10:11    Assessment & Plan:   Problem List Items Addressed This Visit     Essential hypertension - Primary     Cont on Losartan, Norvasc      Acute sinusitis    New Amoxicillin x 10 d Diflucan po prn      Relevant Medications   amoxicillin (AMOXIL) 500 MG capsule  fluconazole (DIFLUCAN) 150 MG tablet   Knee pain, bilateral    Norco prn  Potential benefits of a long term opioids use as well as potential risks (i.e. addiction risk, apnea etc) and complications (i.e. Somnolence, constipation and others) were explained to the patient and were aknowledged. DMV form         Meds ordered this encounter  Medications   amoxicillin (AMOXIL) 500 MG capsule    Sig: Take 1 capsule (500 mg total) by mouth 3 (three) times daily for 10 days.    Dispense:  30 capsule    Refill:  0   fluconazole (DIFLUCAN) 150 MG tablet    Sig: Take 1 tablet (150 mg total) by mouth once for 1 dose.    Dispense:  1 tablet    Refill:  1      Follow-up: Return in about 3 months (around 11/23/2022) for a follow-up visit.  Sonda Primes, MD

## 2022-08-23 NOTE — Patient Instructions (Signed)
07/20/2015 MRN: [id number]   Dorcie Lanser [address] [place] [address]  Dear Ms. Yost,  I am writing to inform you that the polyps removed from your colon were NOT pre-cancerous.   I recommend that you have a repeat colonoscopy exam in 5 years for routine colorectal screening.   Should you develop new or worsening symptoms of abdominal pain, bowel habit changes, or bleeding form the rectum or bowels, please schedule an evaluation either with me or your primary care physician.   Please call us if you have persistent problems or have questions about your condition that have not been fully answered at this time.  Sincerely,   Meryl Dare, MD

## 2022-08-23 NOTE — Assessment & Plan Note (Signed)
Cont on Losartan, Norvasc 

## 2022-08-30 ENCOUNTER — Ambulatory Visit: Payer: Medicare Other | Admitting: Gastroenterology

## 2022-10-12 ENCOUNTER — Other Ambulatory Visit: Payer: Self-pay | Admitting: Endocrinology

## 2022-10-15 ENCOUNTER — Other Ambulatory Visit: Payer: Self-pay | Admitting: Internal Medicine

## 2022-10-19 ENCOUNTER — Other Ambulatory Visit: Payer: Self-pay

## 2022-10-19 ENCOUNTER — Other Ambulatory Visit (INDEPENDENT_AMBULATORY_CARE_PROVIDER_SITE_OTHER): Payer: Medicare Other

## 2022-10-19 ENCOUNTER — Telehealth: Payer: Self-pay | Admitting: Internal Medicine

## 2022-10-19 DIAGNOSIS — G8929 Other chronic pain: Secondary | ICD-10-CM

## 2022-10-19 DIAGNOSIS — E890 Postprocedural hypothyroidism: Secondary | ICD-10-CM

## 2022-10-19 NOTE — Addendum Note (Signed)
Addended by: Hillard Danker A on: 10/19/2022 03:15 PM   Modules accepted: Orders

## 2022-10-19 NOTE — Telephone Encounter (Addendum)
I was able to get the pt a 28 day supply of  rivaroxaban (XARELTO) 20 MG TABS samples as there 7 pills per bottle.  ** Pt has been informed and will be in to pick up samples the morning of 10/20/2022.

## 2022-10-19 NOTE — Telephone Encounter (Signed)
Patient would like to know if she can get samples of her rivaroxaban (XARELTO) 20 MG TABS tablet.   Prescription Request  10/19/2022  LOV: 08/23/2022  What is the name of the medication or equipment? HYDROcodone-acetaminophen (NORCO/VICODIN) 5-325 MG tablet   Have you contacted your pharmacy to request a refill? Yes   Which pharmacy would you like this sent to?  [id number] [place],  - [address] AT [facility] [address] [place] [address] Phone: [phone] Fax: [phone]    Patient notified that their request is being sent to the clinical staff for review and that they should receive a response within 2 business days.   Please advise at Mobile [phone] (mobile)

## 2022-10-19 NOTE — Telephone Encounter (Signed)
I do not see an active pain management contract and I do not see a past UDS. She will need UDS collected then can have fill please route back to me. I strongly recommend that she ask her provider at next visit to establish a controlled substance contract as this helps protect her and her ability to get refill when provider is out of office.

## 2022-10-20 ENCOUNTER — Encounter: Payer: Self-pay | Admitting: Physician Assistant

## 2022-10-20 ENCOUNTER — Ambulatory Visit (INDEPENDENT_AMBULATORY_CARE_PROVIDER_SITE_OTHER): Payer: Medicare Other | Admitting: Physician Assistant

## 2022-10-20 VITALS — BP 130/80 | HR 91 | Ht 65.5 in | Wt 169.8 lb

## 2022-10-20 DIAGNOSIS — K5900 Constipation, unspecified: Secondary | ICD-10-CM

## 2022-10-20 DIAGNOSIS — Z800 Family history of malignant neoplasm of digestive organs: Secondary | ICD-10-CM | POA: Diagnosis not present

## 2022-10-20 DIAGNOSIS — Z7901 Long term (current) use of anticoagulants: Secondary | ICD-10-CM

## 2022-10-20 DIAGNOSIS — Z86010 Personal history of colonic polyps: Secondary | ICD-10-CM | POA: Diagnosis not present

## 2022-10-20 LAB — T4, FREE: Free T4: 0.89 ng/dL (ref 0.60–1.60)

## 2022-10-20 MED ORDER — NA SULFATE-K SULFATE-MG SULF 17.5-3.13-1.6 GM/177ML PO SOLN: 1.0000 | Freq: Once | ORAL | 0 refills | Status: AC

## 2022-10-20 NOTE — Patient Instructions (Addendum)
Start Miralax 1 capful daily in 8 ounces of liquid.  You will be contacted by our office prior to your procedure for directions on holding your Xarelto.  If you do not hear from our office 1 week prior to your scheduled procedure, please call [phone] to discuss.   You have been scheduled for a colonoscopy. Please follow written instructions given to you at your visit today.   Please pick up your prep supplies at the pharmacy within the next 1-3 days.  If you use inhalers (even only as needed), please bring them with you on the day of your procedure.  DO NOT TAKE 7 DAYS PRIOR TO TEST- Trulicity (dulaglutide) Ozempic, Wegovy (semaglutide) Mounjaro (tirzepatide) Bydureon Bcise (exanatide extended release)  DO NOT TAKE 1 DAY PRIOR TO YOUR TEST Rybelsus (semaglutide) Adlyxin (lixisenatide) Victoza (liraglutide) Byetta (exanatide) ___________________________________________________________________________  _______________________________________________________  If your blood pressure at your visit was 140/90 or greater, please contact your primary care physician to follow up on this.  _______________________________________________________  If you are age 85 or older, your body mass index should be between 23-30. Your Body mass index is 27.83 kg/m. If this is out of the aforementioned range listed, please consider follow up with your Primary Care Provider.  If you are age 55 or younger, your body mass index should be between 19-25. Your Body mass index is 27.83 kg/m. If this is out of the aformentioned range listed, please consider follow up with your Primary Care Provider.   ________________________________________________________  The [facility] providers would like to encourage you to use [facility] to communicate with providers for non-urgent requests or questions.  Due to long hold times on the telephone, sending your provider a message by [facility] may be a faster and more  efficient way to get a response.  Please allow 48 business hours for a response.  Please remember that this is for non-urgent requests.  _______________________________________________________

## 2022-10-20 NOTE — Progress Notes (Signed)
Chief Complaint: Family history of colon cancer  HPI:    Ashley Larsen is a 72 year old female with a past medical history as listed below including COPD, IBS, pulmonary embolism on Xarelto and multiple others, known to Dr. Russella Dar, who was referred to me by [name], Ashley Quint, MD for family history of colon cancer.    02/28/14 colonoscopy Dr. Russella Dar done for family history of colon cancer in her 2 sisters, finding 2 sessile polyps in the sigmoid and transverse colon as well as mild diverticulosis.  Pathology showed tubular adenomas, repeat recommended in 3 years.     05/16/2017 patient seen in clinic and at that time discussed need for surveillance colonoscopy on chronic anticoagulation.  She was on Xarelto after PE in 2017.  No issues.  At that time scheduled for colonoscopy.    07/06/2017 colonoscopy with five 5-7 mm polyps in the sigmoid and descending colon, moderate left colon diverticulosis, mild right colon diverticulosis and internal hemorrhoids.  Repeat recommended in 3 years.    10/27/2021 CT of the abdomen pelvis for right lower quadrant pain with possible mild descending colon diverticulitis.  Also advanced degenerative arthritis of the right hip.    Today, patient tells me she is here to discuss follow-up colonoscopy.  Does describe she has had a slight change in bowel habits towards constipation telling me that sometimes it is just little balls and she does not go quite as often as she used to.  She has fiber and MiraLAX at home but has not started them.  Also describes this right lower quadrant pain that is still occurring occasionally.  We had discussed it back in 2019 prior to her last colonoscopy.  CT done in September of last year with finding of degeneration in the right hip and arthritis which could be contributing.  She was started on Gabapentin which she tells me does not really help but puts her to sleep.    Denies fever, chills or blood in her stool.  Past Medical History:   Diagnosis Date   Allergic rhinitis    Allergy    Alopecia    Anemia    Arthritis    Asthma    does not use an inhaler   Bronchitis    Cataract    bilateral cateracts   COPD (chronic obstructive pulmonary disease) (HCC)    Diverticulosis    GERD (gastroesophageal reflux disease)    Hypertension    Hyperthyroidism    IBS (irritable bowel syndrome)    Insomnia    Ovarian cyst    Paresthesia    Pulmonary embolism (HCC)    Restless leg syndrome    Sickle cell trait (HCC)     Past Surgical History:  Procedure Laterality Date   ABDOMINAL HYSTERECTOMY Right 07/24/2019   Procedure: HYSTERECTOMY ABDOMINAL, right salpingo oophorectomy;  Surgeon: Ashley Overlie, MD;  Location: [facility] OR;  Service: Gynecology;  Laterality: Right;   COLONOSCOPY     KNEE ARTHROSCOPY     Left   OOPHORECTOMY     Left   ROTATOR CUFF REPAIR     TUBAL LIGATION     UPPER GASTROINTESTINAL ENDOSCOPY     wisdon teeth extraction      Current Outpatient Medications  Medication Sig Dispense Refill   amLODipine (NORVASC) 5 MG tablet TAKE 1 TABLET(5 MG) BY MOUTH DAILY 90 tablet 2   Artificial Tear Solution (GENTEAL TEARS) 0.1-0.2-0.3 % SOLN Place 1 drop into both eyes daily as needed (Dry eye).  Cholecalciferol (VITAMIN D3) 50 MCG (2000 UT) capsule Take 1 capsule (2,000 Units total) by mouth daily. 100 capsule 3   fexofenadine (ALLEGRA) 180 MG tablet Take 1 tablet (180 mg total) by mouth daily as needed for allergies. 90 tablet 3   fluconazole (DIFLUCAN) 150 MG tablet TAKE 1 TABLET(150 MG) BY MOUTH 1 TIME FOR 1 DOSE 1 tablet 1   fluticasone (FLONASE) 50 MCG/ACT nasal spray Place 2 sprays into both nostrils daily. (Patient taking differently: Place 2 sprays into both nostrils daily as needed for allergies or rhinitis.) 16 g 6   gabapentin (NEURONTIN) 300 MG capsule Take 1 capsule (300 mg total) by mouth at bedtime as needed. 30 capsule 3   HYDROcodone-acetaminophen (NORCO/VICODIN) 5-325 MG tablet Take 1 tablet by  mouth every 8 (eight) hours as needed for severe pain. 90 tablet 0   levothyroxine (SYNTHROID) 100 MCG tablet TAKE 1 TABLET BY MOUTH BEFORE BREAKFAST 90 tablet 3   losartan (COZAAR) 100 MG tablet TAKE 1 TABLET(100 MG) BY MOUTH DAILY 90 tablet 3   methylPREDNISolone (MEDROL DOSEPAK) 4 MG TBPK tablet As directed 21 tablet 0   potassium chloride (KLOR-CON) 10 MEQ tablet TAKE 1 TABLET(10 MEQ) BY MOUTH DAILY 90 tablet 1   rivaroxaban (XARELTO) 20 MG TABS tablet Take 1 tablet (20 mg total) by mouth daily. Lot# [id number] Exp 11/16/23 28 tablet 0   No current facility-administered medications for this visit.    Allergies as of 10/20/2022 - Review Complete 08/23/2022  Allergen Reaction Noted   Metronidazole Other (See Comments) 01/19/2007   Oxycodone-acetaminophen Other (See Comments) 10/20/2010    Family History  Problem Relation Age of Onset   Cancer Brother        brother - stomach ca   Stomach cancer Brother        dx age 31   Heart disease Father    Other Mother        sclerdermia   Colon cancer Sister        dx in her 66's   Hypertension Other    Cancer Sister 62       colon   Diabetes Sister    Colon polyps Sister        and Brother   Cancer Sister 38        colon   Kidney disease Brother    Diabetes Brother    Diabetes Brother    Diabetes Paternal Grandfather    Esophageal cancer Neg Hx    Rectal cancer Neg Hx    Breast cancer Neg Hx     Social History   Socioeconomic History   Marital status: Married    Spouse name: Not on file   Number of children: Not on file   Years of education: Not on file   Highest education level: Not on file  Occupational History   Occupation: Chemical engineer: [employer]  Tobacco Use   Smoking status: Some Days    Current packs/day: 0.00    Average packs/day: 0.3 packs/day for 20.0 years (5.0 ttl pk-yrs)    Types: Cigarettes    Start date: 04/14/2000    Last attempt to quit: 04/14/2020    Years since quitting: 2.5    Smokeless tobacco: Never   Tobacco comments:    smoked 1 - 2 cig in 30 days-- 07-06-17  Vaping Use   Vaping status: Never Used  Substance and Sexual Activity   Alcohol use: No    Alcohol/week: 0.0  standard drinks of alcohol   Drug use: No   Sexual activity: Yes  Other Topics Concern   Not on file  Social History Narrative   Not on file   Social Determinants of Health   Financial Resource Strain: Not on file  Food Insecurity: Not on file  Transportation Needs: Not on file  Physical Activity: Not on file  Stress: Not on file  Social Connections: Not on file  Intimate Partner Violence: Not on file    Review of Systems:    Constitutional: No weight loss, fever or chills Skin: No rash Cardiovascular: No chest pain Respiratory: No SOB Gastrointestinal: See HPI and otherwise negative Genitourinary: No dysuria  Neurological: No headache, dizziness or syncope Musculoskeletal: No new muscle or joint pain Hematologic: No bleeding  Psychiatric: No history of depression or anxiety   Physical Exam:  Vital signs: BP 130/80   Pulse 91   Ht 5' 5.5" (1.664 m)   Wt 169 lb 12.8 oz (77 kg)   BMI 27.83 kg/m    Constitutional:   Pleasant Elderly AA  female appears to be in NAD, Well developed, Well nourished, alert and cooperative Head:  Normocephalic and atraumatic. Eyes:   PEERL, EOMI. No icterus. Conjunctiva pink. Ears:  Normal auditory acuity. Neck:  Supple Throat: Oral cavity and pharynx without inflammation, swelling or lesion.  Respiratory: Respirations even and unlabored. Lungs clear to auscultation bilaterally.   No wheezes, crackles, or rhonchi.  Cardiovascular: Normal S1, S2. No MRG. Regular rate and rhythm. No peripheral edema, cyanosis or pallor.  Gastrointestinal:  Soft, nondistended, nontender. No rebound or guarding. Normal bowel sounds. No appreciable masses or hepatomegaly. Rectal:  Not performed.  Msk:  Symmetrical without gross deformities. Without edema, no  deformity or joint abnormality.  Neurologic:  Alert and  oriented x4;  grossly normal neurologically.  Skin:   Dry and intact without significant lesions or rashes. Psychiatric:  Demonstrates good judgement and reason without abnormal affect or behaviors.  RELEVANT LABS AND IMAGING: CBC    Component Value Date/Time   WBC 7.7 11/20/2021 0000   WBC 5.0 10/27/2021 1644   RBC 4.26 11/17/2021 0000   HGB 13.0 11/20/2021 0000   HCT 39 11/20/2021 0000   PLT 282 11/20/2021 0000   MCV 88.9 10/27/2021 1644   MCH 30.2 10/27/2021 1644   MCHC 34.0 10/27/2021 1644   RDW 12.9 10/27/2021 1644   LYMPHSABS 1.4 05/06/2021 1048   MONOABS 0.5 05/06/2021 1048   EOSABS 0.1 05/06/2021 1048   BASOSABS 0.0 05/06/2021 1048    CMP     Component Value Date/Time   NA 140 10/27/2021 1644   K 3.7 10/27/2021 1644   CL 109 10/27/2021 1644   CO2 26 10/27/2021 1644   GLUCOSE 88 10/27/2021 1644   BUN 10 10/27/2021 1644   CREATININE 0.86 10/27/2021 1644   CALCIUM 9.6 10/27/2021 1644   PROT 7.2 10/27/2021 1644   ALBUMIN 4.1 10/27/2021 1644   AST 27 10/27/2021 1644   ALT 33 10/27/2021 1644   ALKPHOS 70 10/27/2021 1644   BILITOT 0.3 10/27/2021 1644   GFRNONAA >60 10/27/2021 1644   GFRAA 58 (L) 04/10/2015 0233    Assessment: 1.  History of adenomatous polyps: Last colonoscopy in 2019 with repeat recommended in 3 years, patient is overdue 2.  Family history of colon cancer 3.  Chronic right lower quadrant pain: Colonoscopy after we first discussed this with no etiology, CT with a degenerative arthritis in her right hip which could  be contributing 4.  Constipation: Slight change to constipation at times; likely due to age/slow transit 5.  Chronic anticoagulation: Patient on Xarelto for history of PE  Plan: 1.  Patient is scheduled for surveillance colonoscopy with Dr. Russella Dar in [facility].  Did provide the patient a detailed list of risks for the procedure and she agrees to proceed. Patient is appropriate for  endoscopic procedure(s) in the ambulatory ([facility]) setting.  2.  Patient advised to hold her Xarelto for 2 days prior to time of procedure.  We will communicate with her prescribing physician to ensure this is acceptable her. 3.  Briefly discussed right lower quadrant pain, this could be due to change in stools towards constipation.  Recommend she start a fiber supplement Metamucil once daily as well as MiraLAX once daily to see if moving her bowels more often helps with this discomfort.  Also CT showing degenerative arthritis in the right hip which could be contributing. 4.  Patient to follow in clinic per recommendations after time of procedure.  Hyacinth Meeker, PA-C [facility] 10/20/2022, 2:04 PM  Cc: Ashley Garter, MD

## 2022-10-21 ENCOUNTER — Ambulatory Visit (INDEPENDENT_AMBULATORY_CARE_PROVIDER_SITE_OTHER): Payer: Medicare Other | Admitting: Endocrinology

## 2022-10-21 ENCOUNTER — Encounter: Payer: Self-pay | Admitting: Endocrinology

## 2022-10-21 VITALS — BP 120/65 | HR 97 | Ht 65.5 in | Wt 170.0 lb

## 2022-10-21 DIAGNOSIS — E890 Postprocedural hypothyroidism: Secondary | ICD-10-CM | POA: Diagnosis not present

## 2022-10-21 DIAGNOSIS — E0500 Thyrotoxicosis with diffuse goiter without thyrotoxic crisis or storm: Secondary | ICD-10-CM | POA: Diagnosis not present

## 2022-10-21 DIAGNOSIS — Z8639 Personal history of other endocrine, nutritional and metabolic disease: Secondary | ICD-10-CM | POA: Diagnosis not present

## 2022-10-21 LAB — TSH: TSH: 2.13 u[IU]/mL (ref 0.35–5.50)

## 2022-10-21 NOTE — Progress Notes (Addendum)
Outpatient Endocrinology Note [place] Dearis Danis, MD  10/21/22  Patient's Name: Ashley Larsen    DOB: Nov 22, 1950    MRN: [id number]  REASON OF VISIT: Follow-up for hypothyroidism  PCP: [name], Georgina Quint, MD  HISTORY OF PRESENT ILLNESS:   Harla Popke is a 72 y.o. old female with past medical history as listed below is presented for a follow up of postablative hypothyroidism.  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  She has history of Graves' ophthalmopathy/Graves' eye disease and following with ophthalmology.  Uses eyedrops.  Interval history 10/21/22 Patient has been taking levothyroxine/Synthroid 100 mcg daily.  She denies palpitation, heat intolerance or cold intolerance.  No constipation or change in bowel habit.  No new symptoms.  She takes levothyroxine in the morning and empty stomach before breakfast.  And reports  compliance. Recent lab with normal free T4 but TSH was not checked.   Latest Reference Range & Units 10/19/22 11:09  T4,Free(Direct) 0.60 - 1.60 ng/dL 4.09    REVIEW OF SYSTEMS:  As per history of present illness.   PAST MEDICAL HISTORY: Past Medical History:  Diagnosis Date   Allergic rhinitis    Allergy    Alopecia    Anemia    Arthritis    Asthma    does not use an inhaler   Bronchitis    Cataract    bilateral cateracts   COPD (chronic obstructive pulmonary disease) (HCC)    Diverticulosis    GERD (gastroesophageal reflux disease)    Hypertension    Hyperthyroidism    IBS (irritable bowel syndrome)    Insomnia    Ovarian cyst    Paresthesia    Pulmonary embolism (HCC)    Restless leg syndrome    Sickle cell trait (HCC)     PAST SURGICAL HISTORY: Past Surgical History:  Procedure Laterality Date    ABDOMINAL HYSTERECTOMY Right 07/24/2019   Procedure: HYSTERECTOMY ABDOMINAL, right salpingo oophorectomy;  Surgeon: Richarda Overlie, MD;  Location: [facility] OR;  Service: Gynecology;  Laterality: Right;   COLONOSCOPY     KNEE ARTHROSCOPY     Left   OOPHORECTOMY     Left   ROTATOR CUFF REPAIR     TUBAL LIGATION     UPPER GASTROINTESTINAL ENDOSCOPY     wisdon teeth extraction      ALLERGIES: Allergies  Allergen Reactions   Metronidazole Other (See Comments)    REACTION: upset stomach   Oxycodone-Acetaminophen Other (See Comments)    Pt tolerates Vicodin  Caused Halucinations     FAMILY HISTORY:  Family History  Problem Relation Age of Onset   Cancer Brother        brother - stomach ca   Stomach cancer Brother        dx age 44   Heart disease Father    Other Mother        sclerdermia   Colon cancer Sister        dx in her 91's   Hypertension Other    Cancer Sister 53       colon   Diabetes Sister    Colon polyps Sister        and Brother   Cancer Sister 59        colon   Kidney disease Brother    Diabetes  Brother    Diabetes Brother    Diabetes Paternal Grandfather    Esophageal cancer Neg Hx    Rectal cancer Neg Hx    Breast cancer Neg Hx     SOCIAL HISTORY: Social History   Socioeconomic History   Marital status: Married    Spouse name: Not on file   Number of children: Not on file   Years of education: Not on file   Highest education level: Not on file  Occupational History   Occupation: Chemical engineer: [employer]  Tobacco Use   Smoking status: Former    Current packs/day: 0.00    Average packs/day: 0.3 packs/day for 20.0 years (5.0 ttl pk-yrs)    Types: Cigarettes    Start date: 04/14/2000    Quit date: 04/14/2020    Years since quitting: 2.5   Smokeless tobacco: Never   Tobacco comments:    smoked 1 - 2 cig in 30 days-- 07-06-17  Vaping Use   Vaping status: Never Used  Substance and Sexual Activity   Alcohol use: No    Alcohol/week:  0.0 standard drinks of alcohol   Drug use: No   Sexual activity: Yes  Other Topics Concern   Not on file  Social History Narrative   Not on file   Social Determinants of Health   Financial Resource Strain: Not on file  Food Insecurity: Not on file  Transportation Needs: Not on file  Physical Activity: Not on file  Stress: Not on file  Social Connections: Not on file    MEDICATIONS:  Current Outpatient Medications  Medication Sig Dispense Refill   amLODipine (NORVASC) 5 MG tablet TAKE 1 TABLET(5 MG) BY MOUTH DAILY 90 tablet 2   Artificial Tear Solution (GENTEAL TEARS) 0.1-0.2-0.3 % SOLN Place 1 drop into both eyes daily as needed (Dry eye).     Cholecalciferol (VITAMIN D3) 50 MCG (2000 UT) capsule Take 1 capsule (2,000 Units total) by mouth daily. 100 capsule 3   fexofenadine (ALLEGRA) 180 MG tablet Take 1 tablet (180 mg total) by mouth daily as needed for allergies. 90 tablet 3   fluticasone (FLONASE) 50 MCG/ACT nasal spray Place 2 sprays into both nostrils daily. (Patient taking differently: Place 2 sprays into both nostrils daily as needed for allergies or rhinitis.) 16 g 6   gabapentin (NEURONTIN) 300 MG capsule Take 1 capsule (300 mg total) by mouth at bedtime as needed. 30 capsule 3   HYDROcodone-acetaminophen (NORCO/VICODIN) 5-325 MG tablet Take 1 tablet by mouth every 8 (eight) hours as needed for severe pain. 90 tablet 0   levothyroxine (SYNTHROID) 100 MCG tablet TAKE 1 TABLET BY MOUTH BEFORE BREAKFAST 90 tablet 3   losartan (COZAAR) 100 MG tablet TAKE 1 TABLET(100 MG) BY MOUTH DAILY 90 tablet 3   potassium chloride (KLOR-CON) 10 MEQ tablet TAKE 1 TABLET(10 MEQ) BY MOUTH DAILY 90 tablet 1   rivaroxaban (XARELTO) 20 MG TABS tablet Take 1 tablet (20 mg total) by mouth daily. Lot# [id number] Exp 11/16/23 28 tablet 0   fluconazole (DIFLUCAN) 150 MG tablet TAKE 1 TABLET(150 MG) BY MOUTH 1 TIME FOR 1 DOSE (Patient not taking: Reported on 10/20/2022) 1 tablet 1   No current  facility-administered medications for this visit.    PHYSICAL EXAM: Vitals:   10/21/22 1058  BP: 120/65  Pulse: 97  SpO2: 99%  Weight: 170 lb (77.1 kg)  Height: 5' 5.5" (1.664 m)   Body mass index is 27.86 kg/m.  Wt  Readings from Last 3 Encounters:  10/21/22 170 lb (77.1 kg)  10/20/22 169 lb 12.8 oz (77 kg)  08/23/22 170 lb (77.1 kg)     General: Well developed, well nourished female in no apparent distress.  HEENT: AT/[place], no external lesions. Hearing intact to the spoken word Eyes: EOMI. Mild b/l proptosis or no lid lag. Conjunctiva clear and no icterus. Neck: Trachea midline, neck supple without appreciable thyromegaly or lymphadenopathy and no palpable thyroid nodules Lungs: Clear to auscultation, no wheeze. Respirations not labored Heart: S1S2, Regular in rate and rhythm.  Abdomen: Soft, non tender, non distended Neurologic: Alert, oriented, normal speech, deep tendon biceps reflexes normal,  no gross focal neurological deficit Extremities: No pedal pitting edema, no tremors of outstretched hands Skin: Warm, color good.  Psychiatric: Does not appear depressed or anxious  PERTINENT HISTORIC LABORATORY AND IMAGING STUDIES:  All pertinent laboratory results were reviewed. Please see HPI also for further details.   TSH  Date Value Ref Range Status  10/13/2021 0.87 0.35 - 5.50 uIU/mL Final  05/06/2021 1.07 0.35 - 5.50 uIU/mL Final  10/07/2020 1.14 0.35 - 5.50 uIU/mL Final     ASSESSMENT / PLAN  1. Hypothyroidism, postablative   2. Graves' ophthalmopathy   3. H/O Graves' disease    -Patient had a history of Graves' disease status post radioactive iodine ablation in 2015 and developed postablative hypothyroidism. -She is currently taking levothyroxine 100 mcg daily.  She is clinically euthyroid.   We discussed the medical need for compliance with levothyroxine therapy, that it is a hormone necessary for life, and that serious consequences may result from  noncompliance. Discussed the proper method of levothyroxine administration: take on an empty stomach in the morning, with water, waiting thirty to sixty minutes before taking any other beverages or food. Also reviewed the need to take calcium or iron supplements or multivitamin (that may contain iron or calcium) at least 4 hours after levothyroxine administration.  Plan: -Recent free T4 was normal however TSH not checked.  Will check TSH today.  She needs refills of levothyroxine. -Annual endocrinology follow-up.  # Graves' eye disease no active Graves' eye disease.  She uses eyedrops from over-the-counter.  She used to follow-up with ophthalmology and currently not following for about 1 to 2 years.  She is going to find ophthalmology for follow-up.  Normal thyroid function test, continue current dose of levothyroxine 100 mcg daily.  Medication refilled.   Latest Reference Range & Units 10/19/22 11:09 10/21/22 11:24  TSH 0.35 - 5.50 uIU/mL  2.13  T4,Free(Direct) 0.60 - 1.60 ng/dL 5.28     Diagnoses and all orders for this visit:  Hypothyroidism, postablative -     TSH; Future  Graves' ophthalmopathy  H/O Graves' disease    DISPOSITION Follow up in clinic in 12 months suggested.  All questions answered and patient verbalized understanding of the plan.  [place] Rishik Tubby, MD [facility] [address] [place], [address] [place], [place] [address] Phone # [phone]  At least part of this note was generated using voice recognition software. Inadvertent word errors may have occurred, which were not recognized during the proofreading process.

## 2022-10-21 NOTE — Telephone Encounter (Signed)
Patient verbalized she understood and just ok to having UDS done

## 2022-10-22 ENCOUNTER — Telehealth: Payer: Self-pay | Admitting: *Deleted

## 2022-10-22 MED ORDER — LEVOTHYROXINE SODIUM 100 MCG PO TABS: ORAL_TABLET | ORAL | 4 refills | Status: DC

## 2022-10-22 NOTE — Addendum Note (Signed)
Addended by: Aubriana Ravelo, [place] on: 10/22/2022 11:29 AM   Modules accepted: Orders

## 2022-10-22 NOTE — Telephone Encounter (Signed)
Clearance request sent to Dr. Posey Rea in basket via Epic.

## 2022-11-01 ENCOUNTER — Telehealth: Payer: Self-pay | Admitting: Internal Medicine

## 2022-11-01 NOTE — Telephone Encounter (Signed)
Pt is requesting hydrocodone and would like for it to be sent to the pharmacy on file. If pt needs to set an appointment please let me know.

## 2022-11-04 DIAGNOSIS — M1711 Unilateral primary osteoarthritis, right knee: Secondary | ICD-10-CM | POA: Diagnosis not present

## 2022-11-05 ENCOUNTER — Encounter (HOSPITAL_COMMUNITY): Payer: Self-pay

## 2022-11-05 ENCOUNTER — Telehealth: Payer: Self-pay | Admitting: Physician Assistant

## 2022-11-05 ENCOUNTER — Emergency Department (HOSPITAL_COMMUNITY)
Admission: EM | Admit: 2022-11-05 | Discharge: 2022-11-05 | Disposition: A | Discharge status: Home / Self Care | Payer: Medicare Other | Attending: Emergency Medicine | Admitting: Emergency Medicine

## 2022-11-05 ENCOUNTER — Other Ambulatory Visit: Payer: Self-pay

## 2022-11-05 DIAGNOSIS — Z7901 Long term (current) use of anticoagulants: Secondary | ICD-10-CM | POA: Diagnosis not present

## 2022-11-05 DIAGNOSIS — K921 Melena: Secondary | ICD-10-CM | POA: Diagnosis not present

## 2022-11-05 LAB — COMPREHENSIVE METABOLIC PANEL
ALT: 24 U/L (ref 0–44)
AST: 22 U/L (ref 15–41)
Albumin: 3.7 g/dL (ref 3.5–5.0)
Alkaline Phosphatase: 75 U/L (ref 38–126)
Anion gap: 7 (ref 5–15)
BUN: 15 mg/dL (ref 8–23)
CO2: 23 mmol/L (ref 22–32)
Calcium: 9.3 mg/dL (ref 8.9–10.3)
Chloride: 109 mmol/L (ref 98–111)
Creatinine, Ser: 0.93 mg/dL (ref 0.44–1.00)
GFR, Estimated: >60 mL/min (ref >60)
Glucose, Bld: 133 mg/dL — ABNORMAL HIGH (ref 70–99)
Potassium: 3.7 mmol/L (ref 3.5–5.1)
Sodium: 139 mmol/L (ref 135–145)
Total Bilirubin: 0.2 mg/dL — ABNORMAL LOW (ref 0.3–1.2)
Total Protein: 7.6 g/dL (ref 6.5–8.1)

## 2022-11-05 LAB — CBC
HCT: 35.3 % — ABNORMAL LOW (ref 36.0–46.0)
Hemoglobin: 11.8 g/dL — ABNORMAL LOW (ref 12.0–15.0)
MCH: 29.4 pg (ref 26.0–34.0)
MCHC: 33.4 g/dL (ref 30.0–36.0)
MCV: 87.8 fL (ref 80.0–100.0)
Platelets: 284 10*3/uL (ref 150–400)
RBC: 4.02 MIL/uL (ref 3.87–5.11)
RDW: 13.4 % (ref 11.5–15.5)
WBC: 8.1 10*3/uL (ref 4.0–10.5)
nRBC: 0 % (ref 0.0–0.2)

## 2022-11-05 LAB — TYPE AND SCREEN
ABO/RH(D): A POS
Antibody Screen: NEGATIVE

## 2022-11-05 LAB — POC OCCULT BLOOD, ED: Fecal Occult Bld: POSITIVE — AB

## 2022-11-05 MED ORDER — OMEPRAZOLE 20 MG PO CPDR: 20.0000 mg | DELAYED_RELEASE_CAPSULE | Freq: Every day | ORAL | 0 refills | Status: AC

## 2022-11-05 MED ORDER — DOCUSATE SODIUM 100 MG PO CAPS: 100.0000 mg | ORAL_CAPSULE | Freq: Two times a day (BID) | ORAL | 0 refills | Status: AC

## 2022-11-05 MED ORDER — OMEPRAZOLE 20 MG PO CPDR: 20.0000 mg | DELAYED_RELEASE_CAPSULE | Freq: Every day | ORAL | 0 refills | Status: DC

## 2022-11-05 MED ORDER — DOCUSATE SODIUM 100 MG PO CAPS: 100.0000 mg | ORAL_CAPSULE | Freq: Two times a day (BID) | ORAL | 0 refills | Status: DC

## 2022-11-05 NOTE — ED Triage Notes (Signed)
Reports started having blood in stool.  First time it was dark and second time it was dark but bright red on the tissue.  Mild pain to right lower abd which she reports is chronic

## 2022-11-05 NOTE — ED Provider Notes (Signed)
EMERGENCY DEPARTMENT AT [facility] Provider Note   CSN: [id number] Arrival date & time: 11/05/22  1302     History  Chief Complaint  Patient presents with   Blood In Stools    Ashley Larsen is a 72 y.o. female.  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  Patient reports that today she noted first a dark stool and then later right red blood per rectum with wiping.  Patient denies she is any prior history of GI bleeding.  She reports her last colonoscopy was about 5 years ago and she is scheduled for 1 in October.  Patient reports for a long time she has had a pain off and on.  It happens about once a week that she gets a pain in her lower abdomen that radiates down toward the pubis.  She is not currently have any pain.  She denies she is felt constipated or been straining at stool.  No lightheadedness dizziness or near syncope.    Home Medications Prior to Admission medications   Medication Sig Start Date End Date Taking? Authorizing Provider  amLODipine (NORVASC) 5 MG tablet TAKE 1 TABLET(5 MG) BY MOUTH DAILY 05/20/22   [name], Georgina Quint, MD  Artificial Tear Solution (GENTEAL TEARS) 0.1-0.2-0.3 % SOLN Place 1 drop into both eyes daily as needed (Dry eye).    [provider]  Cholecalciferol (VITAMIN D3) 50 MCG (2000 UT) capsule Take 1 capsule (2,000 Units total) by mouth daily. 07/17/19   [name], Georgina Quint, MD  docusate sodium (COLACE) 100 MG capsule Take 1 capsule (100 mg total) by mouth every 12 (twelve) hours. 11/05/22   Arby Barrette, MD  fexofenadine (ALLEGRA) 180 MG tablet Take 1 tablet (180 mg total) by mouth daily as needed for allergies. 07/24/20   [name], Georgina Quint, MD  fluconazole (DIFLUCAN) 150 MG tablet TAKE 1 TABLET(150 MG) BY MOUTH 1 TIME FOR 1 DOSE Patient not taking: Reported on 10/20/2022 10/20/22   [name], Georgina Quint, MD  fluticasone (FLONASE) 50 MCG/ACT nasal spray Place 2 sprays into both nostrils  daily. Patient taking differently: Place 2 sprays into both nostrils daily as needed for allergies or rhinitis. 04/15/17   [name], Georgina Quint, MD  gabapentin (NEURONTIN) 300 MG capsule Take 1 capsule (300 mg total) by mouth at bedtime as needed. 11/09/21   [name], Georgina Quint, MD  HYDROcodone-acetaminophen (NORCO/VICODIN) 5-325 MG tablet Take 1 tablet by mouth every 8 (eight) hours as needed for severe pain. 08/15/22 07/27/24  [name], Georgina Quint, MD  levothyroxine (SYNTHROID) 100 MCG tablet TAKE 1 TABLET BY MOUTH BEFORE BREAKFAST 10/22/22   Thapa, [place], MD  losartan (COZAAR) 100 MG tablet TAKE 1 TABLET(100 MG) BY MOUTH DAILY 01/26/22   [name], Georgina Quint, MD  omeprazole (PRILOSEC) 20 MG capsule Take 1 capsule (20 mg total) by mouth daily. 11/05/22   Arby Barrette, MD  potassium chloride (KLOR-CON) 10 MEQ tablet TAKE 1 TABLET(10 MEQ) BY MOUTH DAILY 07/20/22   [name], Georgina Quint, MD  rivaroxaban (XARELTO) 20 MG TABS tablet Take 1 tablet (20 mg total) by mouth daily. Lot# [id number] Exp 11/16/23 06/22/22   [name], Georgina Quint, MD      Allergies    Metronidazole and Oxycodone-acetaminophen    Review of Systems   Review of Systems  Physical Exam Updated Vital Signs BP 139/83 (BP Location: Right Arm)   Pulse 85   Temp 98.2 F (36.8 C) (Oral)   Resp 16   Ht 5'  5.5" (1.664 m)   Wt 77.1 kg   SpO2 100%   BMI 27.86 kg/m  Physical Exam Constitutional:      Comments: Alert nontoxic clinically well in appearance.  HENT:     Head: Normocephalic and atraumatic.     Mouth/Throat:     Mouth: Mucous membranes are moist.     Pharynx: Oropharynx is clear.  Eyes:     Extraocular Movements: Extraocular movements intact.  Cardiovascular:     Rate and Rhythm: Normal rate and regular rhythm.  Pulmonary:     Effort: Pulmonary effort is normal.     Breath sounds: Normal breath sounds.  Abdominal:     General: There is no distension.     Palpations: Abdomen is soft.     Tenderness: There is no  abdominal tenderness. There is no guarding.  Genitourinary:    Comments: Small nonthrombosed hemorrhoid at the anal opening.  No active bleeding or any erythema drainage or irritation.  Small amount of firm stool in the rectal vault.  Brown in appearance.  No melena.  No red blood visible. Musculoskeletal:        General: No swelling or tenderness. Normal range of motion.  Skin:    General: Skin is warm and dry.  Neurological:     General: No focal deficit present.     Mental Status: She is oriented to person, place, and time.     Coordination: Coordination normal.  Psychiatric:        Mood and Affect: Mood normal.     ED Results / Procedures / Treatments   Labs (all labs ordered are listed, but only abnormal results are displayed) Labs Reviewed  COMPREHENSIVE METABOLIC PANEL - Abnormal; Notable for the following components:      Result Value   Glucose, Bld 133 (*)    Total Bilirubin 0.2 (*)    All other components within normal limits  CBC - Abnormal; Notable for the following components:   Hemoglobin 11.8 (*)    HCT 35.3 (*)    All other components within normal limits  POC OCCULT BLOOD, ED - Abnormal; Notable for the following components:   Fecal Occult Bld POSITIVE (*)    All other components within normal limits  TYPE AND SCREEN    EKG None  Radiology No results found.  Procedures Procedures    Medications Ordered in ED Medications - No data to display  ED Course/ Medical Decision Making/ A&P                                 Medical Decision Making Amount and/or Complexity of Data Reviewed Labs: ordered.  Risk OTC drugs. Prescription drug management.   Patient presents as outlined with blood that she noted in the stool earlier today.  She does not have associated symptoms of anemia.  No lightheadedness dizziness.  No abdominal pain.  Patient is anticoagulated on Xarelto for prior history of PE.  Patient reports she was told that she would be  anticoagulated for life.  Will proceed with lab work and orthostatic vital signs.  Patient's vital signs are stable.  She is not showing acute signs of active bleeding or hypovolemia.  White count 8.1 hemoglobin 11.8.  Platelets 284.  GFR normal.  Occult stool positive.  Orthostatic vital signs are normal.  EMR review shows patient had colonoscopy 5\2019.  There were polyps that were biopsied.  Patient also had  diverticulosis and some internal hemorrhoids.  Patient has remained clinically stable.  She has not had any recurrence of bleeding.  At this time with no orthostasis and what is likely to be lower GI bleeding with known polyps, diverticulosis and internal hemorrhoids, I feel the patient is currently stable for discharge.  The plan will be to hold Xarelto for 2 days.  I will have the patient empirically start on omeprazole for protection of the stomach and Colace to try to avoid any constipation and straining.  I have reviewed very strict return precautions with the patient and her daughter at bedside.  She is established to get a colonoscopy in October and has PCP who follows her closely.  I advise she get repeat labs at the beginning of the week and low threshold for returning if any concerns over the weekend.        Final Clinical Impression(s) / ED Diagnoses Final diagnoses:  Blood in stool    Rx / DC Orders ED Discharge Orders          Ordered    omeprazole (PRILOSEC) 20 MG capsule  Daily,   Status:  Discontinued        11/05/22 2009    docusate sodium (COLACE) 100 MG capsule  Every 12 hours,   Status:  Discontinued        11/05/22 2009    docusate sodium (COLACE) 100 MG capsule  Every 12 hours        11/05/22 2010    omeprazole (PRILOSEC) 20 MG capsule  Daily        11/05/22 2010              Arby Barrette, MD 11/05/22 2025

## 2022-11-05 NOTE — Discharge Instructions (Addendum)
1.  Do not take your Xarelto for the next 48 hours. 2.  Start taking omeprazole daily.  Take Colace twice daily.  You need to avoid any constipation or straining is much as possible. 3.  Call your doctor on Monday to schedule a recheck of your blood count early next week. 4.  Return to the emergency department if you are getting lightheaded and dizzy, your blood pressure is getting lower your heart rate elevated, you are seeing persistent blood with your bowel movement.

## 2022-11-05 NOTE — Telephone Encounter (Signed)
Inbound call from patient, states she passed a bowel movement this morning and was rectally bleeding. States everything was "red". And she would like to speak to a nurse in regards.

## 2022-11-05 NOTE — Telephone Encounter (Signed)
Called and spoke with patient. Pt reports that she had a loose BM today that was "black and tarry". Pt reports that she noted red blood that colored the commode water and the toilet paper, also with wiping she noticed some "clots". Pt went to the restroom again just to urinate and passed more blood and clots, no stool. Pt reports some dizziness. Last dose of Xarelto was yesterday evening. Pt denies any abdominal cramping at this time. Pt has not taken any iron or Pepto Bismol recently. Pt reports that her BP was 188/84 and HR 114 after taking her BP meds. I have advised pt to report to ER for expedited evaluation. Pt verbalized understanding and had no concerns at the end of the call.

## 2022-11-05 NOTE — Telephone Encounter (Signed)
Ashley Larsen 11-07-1950 [id number]  Dear Dr. Posey Rea:  We have scheduled the above named patient for a(n) colonoscopy procedure. Our records show that (s)he is on anticoagulation therapy.  Please advise as to whether the patient may come off their therapy of Xarelto 2 days prior to their procedure which is scheduled for 12/06/22.  Please route your response to Cristela Felt, CMA or fax response to [phone].  Sincerely,   Cristela Felt, [facility]

## 2022-11-05 NOTE — ED Notes (Signed)
PT ambulated to bathroom w/o difficulty

## 2022-11-09 ENCOUNTER — Telehealth: Payer: Self-pay

## 2022-11-09 MED ORDER — HYDROCODONE-ACETAMINOPHEN 5-325 MG PO TABS: 1.0000 | ORAL_TABLET | Freq: Three times a day (TID) | ORAL | 0 refills | Status: DC | PRN

## 2022-11-09 NOTE — Transitions of Care (Post Inpatient/ED Visit) (Signed)
11/09/2022  Name: Ashley Larsen MRN: [id number] DOB: 12-03-1950  Today's TOC FU Call Status:   Patient's Name and Date of Birth confirmed.  Transition Care Management Follow-up Telephone Call Date of Discharge: 11/05/22 Discharge Facility: [facility]) Type of Discharge: Emergency Department Reason for ED Visit: Other: How have you been since you were released from the hospital?: Same Any questions or concerns?: No  Items Reviewed: Did you receive and understand the discharge instructions provided?: Yes Medications obtained,verified, and reconciled?: Yes (Medications Reviewed) Any new allergies since your discharge?: No Dietary orders reviewed?: NA Do you have support at home?: Yes People in Home: child(ren), adult Name of Support/Comfort Primary Source: [name]  Medications Reviewed Today: Medications Reviewed Today   Medications were not reviewed in this encounter     Home Care and Equipment/Supplies: Were Home Health Services Ordered?: No Any new equipment or medical supplies ordered?: No  Functional Questionnaire: Do you need assistance with bathing/showering or dressing?: No Do you need assistance with meal preparation?: No Do you need assistance with eating?: No Do you have difficulty maintaining continence: No Do you need assistance with getting out of bed/getting out of a chair/moving?: No Do you have difficulty managing or taking your medications?: No  Follow up appointments reviewed: PCP Follow-up appointment confirmed?: Yes Date of PCP follow-up appointment?: 11/23/22 Follow-up Provider: [facility] Follow-up appointment confirmed?: NA Do you need transportation to your follow-up appointment?: No Do you understand care options if your condition(s) worsen?: Yes-patient verbalized understanding  Patient already had an appointment scheduled will discuss everything with Dr. Macario Golds when she comes in. Also was very frustrated during the  call in regards to her medication refill that she needs.    SIGNATUREReather Laurence, RMA

## 2022-11-09 NOTE — Telephone Encounter (Signed)
Okay.  Thanks.

## 2022-11-09 NOTE — Telephone Encounter (Signed)
Hi Heather, Okay to go by your routine protocol. Thank you, AP

## 2022-11-16 NOTE — Telephone Encounter (Signed)
Patient informed. 

## 2022-11-18 ENCOUNTER — Other Ambulatory Visit: Payer: Self-pay | Admitting: Internal Medicine

## 2022-11-23 ENCOUNTER — Encounter: Payer: Self-pay | Admitting: Internal Medicine

## 2022-11-23 ENCOUNTER — Ambulatory Visit (INDEPENDENT_AMBULATORY_CARE_PROVIDER_SITE_OTHER): Payer: Medicare Other | Admitting: Internal Medicine

## 2022-11-23 VITALS — BP 120/70 | HR 94 | Temp 98.6°F | Ht 65.5 in | Wt 170.0 lb

## 2022-11-23 DIAGNOSIS — Z23 Encounter for immunization: Secondary | ICD-10-CM

## 2022-11-23 DIAGNOSIS — I10 Essential (primary) hypertension: Secondary | ICD-10-CM | POA: Diagnosis not present

## 2022-11-23 DIAGNOSIS — M25561 Pain in right knee: Secondary | ICD-10-CM

## 2022-11-23 DIAGNOSIS — M25562 Pain in left knee: Secondary | ICD-10-CM

## 2022-11-23 DIAGNOSIS — M1711 Unilateral primary osteoarthritis, right knee: Secondary | ICD-10-CM | POA: Diagnosis not present

## 2022-11-23 DIAGNOSIS — K921 Melena: Secondary | ICD-10-CM | POA: Insufficient documentation

## 2022-11-23 DIAGNOSIS — G8929 Other chronic pain: Secondary | ICD-10-CM | POA: Diagnosis not present

## 2022-11-23 DIAGNOSIS — M179 Osteoarthritis of knee, unspecified: Secondary | ICD-10-CM | POA: Insufficient documentation

## 2022-11-23 NOTE — Assessment & Plan Note (Signed)
Cont on Losartan, Norvasc

## 2022-11-23 NOTE — Assessment & Plan Note (Signed)
Norco prn  Potential benefits of a long term opioids use as well as potential risks (i.e. addiction risk, apnea etc) and complications (i.e. Somnolence, constipation and others) were explained to the patient and were aknowledged. DMV form

## 2022-11-23 NOTE — Addendum Note (Signed)
Addended by: Delsa Grana R on: 11/23/2022 11:49 AM   Modules accepted: Orders

## 2022-11-23 NOTE — Progress Notes (Signed)
Subjective:  Patient ID: Ashley Larsen, female    DOB: 04-03-1950  Age: 72 y.o. MRN: [id number]  CC: Hospitalization Follow-up   HPI Ashley Larsen presents for blood in stool - s/p ER visit on 11/05/22. F/u on HTN C/o R knee swelling - Dr Luiz Blare  Outpatient Medications Prior to Visit  Medication Sig Dispense Refill   amLODipine (NORVASC) 5 MG tablet TAKE 1 TABLET(5 MG) BY MOUTH DAILY 90 tablet 2   Artificial Tear Solution (GENTEAL TEARS) 0.1-0.2-0.3 % SOLN Place 1 drop into both eyes daily as needed (Dry eye).     Cholecalciferol (VITAMIN D3) 50 MCG (2000 UT) capsule Take 1 capsule (2,000 Units total) by mouth daily. 100 capsule 3   docusate sodium (COLACE) 100 MG capsule Take 1 capsule (100 mg total) by mouth every 12 (twelve) hours. 60 capsule 0   fexofenadine (ALLEGRA) 180 MG tablet Take 1 tablet (180 mg total) by mouth daily as needed for allergies. 90 tablet 3   fluticasone (FLONASE) 50 MCG/ACT nasal spray Place 2 sprays into both nostrils daily. (Patient taking differently: Place 2 sprays into both nostrils daily as needed for allergies or rhinitis.) 16 g 6   gabapentin (NEURONTIN) 300 MG capsule Take 1 capsule (300 mg total) by mouth at bedtime as needed. 30 capsule 3   HYDROcodone-acetaminophen (NORCO/VICODIN) 5-325 MG tablet Take 1 tablet by mouth every 8 (eight) hours as needed for severe pain. 90 tablet 0   levothyroxine (SYNTHROID) 100 MCG tablet TAKE 1 TABLET BY MOUTH BEFORE BREAKFAST 90 tablet 4   losartan (COZAAR) 100 MG tablet TAKE 1 TABLET(100 MG) BY MOUTH DAILY 90 tablet 3   omeprazole (PRILOSEC) 20 MG capsule Take 1 capsule (20 mg total) by mouth daily. 30 capsule 0   potassium chloride (KLOR-CON) 10 MEQ tablet TAKE 1 TABLET(10 MEQ) BY MOUTH DAILY 90 tablet 1   rivaroxaban (XARELTO) 20 MG TABS tablet TAKE 1 TABLET(20 MG) BY MOUTH DAILY 90 tablet 3   fluconazole (DIFLUCAN) 150 MG tablet TAKE 1 TABLET(150 MG) BY MOUTH 1 TIME FOR 1 DOSE (Patient not taking: Reported on  10/20/2022) 1 tablet 1   No facility-administered medications prior to visit.    ROS: Review of Systems  Constitutional:  Negative for activity change, appetite change, chills, fatigue and unexpected weight change.  HENT:  Negative for congestion, mouth sores and sinus pressure.   Eyes:  Negative for visual disturbance.  Respiratory:  Negative for cough and chest tightness.   Gastrointestinal:  Negative for abdominal pain and nausea.  Genitourinary:  Negative for difficulty urinating, frequency and vaginal pain.  Musculoskeletal:  Negative for back pain and gait problem.  Skin:  Negative for pallor and rash.  Neurological:  Negative for dizziness, tremors, weakness, numbness and headaches.  Psychiatric/Behavioral:  Negative for confusion and sleep disturbance.     Objective:  BP 120/70 (BP Location: Left Arm, Patient Position: Sitting, Cuff Size: Normal)   Pulse 94   Temp 98.6 F (37 C) (Oral)   Ht 5' 5.5" (1.664 m)   Wt 170 lb (77.1 kg)   SpO2 97%   BMI 27.86 kg/m   BP Readings from Last 3 Encounters:  11/23/22 120/70  11/05/22 (!) 150/86  10/21/22 120/65    Wt Readings from Last 3 Encounters:  11/23/22 170 lb (77.1 kg)  11/05/22 170 lb (77.1 kg)  10/21/22 170 lb (77.1 kg)    Physical Exam Constitutional:      General: She is not in acute distress.  Appearance: She is well-developed. She is obese.  HENT:     Head: Normocephalic.     Right Ear: External ear normal.     Left Ear: External ear normal.     Nose: Nose normal.  Eyes:     General:        Right eye: No discharge.        Left eye: No discharge.     Conjunctiva/sclera: Conjunctivae normal.     Pupils: Pupils are equal, round, and reactive to light.  Neck:     Thyroid: No thyromegaly.     Vascular: No JVD.     Trachea: No tracheal deviation.  Cardiovascular:     Rate and Rhythm: Normal rate and regular rhythm.     Heart sounds: Normal heart sounds.  Pulmonary:     Effort: No respiratory  distress.     Breath sounds: No stridor. No wheezing.  Abdominal:     General: Bowel sounds are normal. There is no distension.     Palpations: Abdomen is soft. There is no mass.     Tenderness: There is no abdominal tenderness. There is no guarding or rebound.  Musculoskeletal:        General: No tenderness.     Cervical back: Normal range of motion and neck supple. No rigidity.  Lymphadenopathy:     Cervical: No cervical adenopathy.  Skin:    Findings: No erythema or rash.  Neurological:     Cranial Nerves: No cranial nerve deficit.     Motor: No abnormal muscle tone.     Coordination: Coordination normal.     Deep Tendon Reflexes: Reflexes normal.  Psychiatric:        Behavior: Behavior normal.        Thought Content: Thought content normal.        Judgment: Judgment normal.   R knee w/scars and w/swelling  Lab Results  Component Value Date   WBC 8.1 11/05/2022   HGB 11.8 (L) 11/05/2022   HCT 35.3 (L) 11/05/2022   PLT 284 11/05/2022   GLUCOSE 133 (H) 11/05/2022   CHOL 249 (A) 06/11/2021   TRIG 109 06/11/2021   HDL 48 06/03/2020   LDLDIRECT 185.7 01/19/2012   LDLCALC 179 06/11/2021   ALT 24 11/05/2022   AST 22 11/05/2022   NA 139 11/05/2022   K 3.7 11/05/2022   CL 109 11/05/2022   CREATININE 0.93 11/05/2022   BUN 15 11/05/2022   CO2 23 11/05/2022   TSH 2.13 10/21/2022   INR 1.0 07/24/2019   HGBA1C 5.6 06/11/2021    No results found.  Assessment & Plan:   Problem List Items Addressed This Visit     Essential hypertension - Primary     Cont on Losartan, Norvasc      Knee pain, bilateral    Norco prn  Potential benefits of a long term opioids use as well as potential risks (i.e. addiction risk, apnea etc) and complications (i.e. Somnolence, constipation and others) were explained to the patient and were aknowledged. DMV form      Knee osteoarthritis    R knee OA - Dr Luiz Blare      Hematochezia    Mild. No relapse Colonoscopy - pending           No orders of the defined types were placed in this encounter.     Follow-up: Return in about 3 months (around 02/23/2023) for a follow-up visit.  Sonda Primes, MD

## 2022-11-23 NOTE — Assessment & Plan Note (Signed)
Mild. No relapse Colonoscopy - pending

## 2022-11-23 NOTE — Assessment & Plan Note (Signed)
R knee OA - Dr Luiz Blare

## 2022-12-06 ENCOUNTER — Encounter: Payer: Medicare Other | Admitting: Gastroenterology

## 2022-12-07 ENCOUNTER — Encounter: Payer: Self-pay | Admitting: Gastroenterology

## 2022-12-07 ENCOUNTER — Ambulatory Visit: Payer: Medicare Other | Admitting: Gastroenterology

## 2022-12-07 ENCOUNTER — Telehealth: Payer: Self-pay

## 2022-12-07 VITALS — BP 139/76 | HR 86 | Temp 97.1°F | Resp 16 | Ht 65.5 in | Wt 170.0 lb

## 2022-12-07 DIAGNOSIS — D124 Benign neoplasm of descending colon: Secondary | ICD-10-CM

## 2022-12-07 DIAGNOSIS — D123 Benign neoplasm of transverse colon: Secondary | ICD-10-CM | POA: Diagnosis not present

## 2022-12-07 DIAGNOSIS — Z09 Encounter for follow-up examination after completed treatment for conditions other than malignant neoplasm: Secondary | ICD-10-CM | POA: Diagnosis not present

## 2022-12-07 DIAGNOSIS — K635 Polyp of colon: Secondary | ICD-10-CM

## 2022-12-07 DIAGNOSIS — K621 Rectal polyp: Secondary | ICD-10-CM | POA: Diagnosis not present

## 2022-12-07 DIAGNOSIS — D128 Benign neoplasm of rectum: Secondary | ICD-10-CM | POA: Diagnosis not present

## 2022-12-07 DIAGNOSIS — Z860100 Personal history of colon polyps, unspecified: Secondary | ICD-10-CM | POA: Diagnosis not present

## 2022-12-07 DIAGNOSIS — Z800 Family history of malignant neoplasm of digestive organs: Secondary | ICD-10-CM | POA: Diagnosis not present

## 2022-12-07 DIAGNOSIS — Z860101 Personal history of adenomatous and serrated colon polyps: Secondary | ICD-10-CM | POA: Diagnosis not present

## 2022-12-07 MED ORDER — SODIUM CHLORIDE 0.9 % IV SOLN: 500.0000 mL | INTRAVENOUS | Status: DC

## 2022-12-07 NOTE — Telephone Encounter (Signed)
Called patient to offer to schedule hemorrhoid banding appts. Patient declined at this time and will call back when she is ready to schedule.

## 2022-12-07 NOTE — Op Note (Addendum)
[facility] Patient Name: Ashley Larsen Procedure Date: 12/07/2022 10:58 AM MRN: [id number] Endoscopist: Meryl Dare , MD, [phone] Age: 72 Referring MD:  Date of Birth: Jul 12, 1950 Gender: Female Account #: [id number] Procedure:                Colonoscopy Indications:              High risk colon cancer surveillance: Personal                            history of sessile serrated colon polyp (less than                            10 mm in size) with no dysplasia, Family history of                            colon cancer, 1st-degree relatives, Hematochezia Medicines:                Monitored Anesthesia Care Procedure:                Pre-Anesthesia Assessment:                           - Prior to the procedure, a History and Physical                            was performed, and patient medications and                            allergies were reviewed. The patient's tolerance of                            previous anesthesia was also reviewed. The risks                            and benefits of the procedure and the sedation                            options and risks were discussed with the patient.                            All questions were answered, and informed consent                            was obtained. Prior Anticoagulants: The patient has                            taken Xarelto (rivaroxaban), last dose was 2 days                            prior to procedure. ASA Grade Assessment: III - A                            patient with severe systemic disease. After  reviewing the risks and benefits, the patient was                            deemed in satisfactory condition to undergo the                            procedure.                           After obtaining informed consent, the colonoscope                            was passed under direct vision. Throughout the                            procedure, the patient's blood  pressure, pulse, and                            oxygen saturations were monitored continuously. The                            CF [id number] #[id number] was introduced through the anus                            and advanced to the the cecum, identified by                            appendiceal orifice and ileocecal valve. The                            ileocecal valve, appendiceal orifice, and rectum                            were photographed. The quality of the bowel                            preparation was good. The colonoscopy was performed                            without difficulty. The patient tolerated the                            procedure well. Scope In: 11:09:07 AM Scope Out: 11:26:00 AM Scope Withdrawal Time: 0 hours 14 minutes 25 seconds  Total Procedure Duration: 0 hours 16 minutes 53 seconds  Findings:                 The perianal and digital rectal examinations were                            normal.                           Six sessile polyps were found in the rectum (2),  descending colon (2) and transverse colon (2). The                            polyps were 5 to 7 mm in size. These polyps were                            removed with a cold snare. Resection and retrieval                            were complete except for one 5mm transverse colon                            polyp.                           Multiple small-mouthed diverticula were found in                            the left colon. There was no evidence of                            diverticular bleeding.                           External and internal hemorrhoids were found during                            retroflexion. The hemorrhoids were small and Grade                            I (internal hemorrhoids that do not prolapse).                           The exam was otherwise without abnormality on                            direct and retroflexion views. Complications:             No immediate complications. Estimated blood loss:                            None. Estimated Blood Loss:     Estimated blood loss: none. Impression:               - Six 5 to 7 mm polyps in the rectum, in the                            descending colon and in the transverse colon,                            removed with a cold snare. Resected and retrieved.                           - Mild diverticulosis in the left colon.                           -  External and internal hemorrhoids.                           - The examination was otherwise normal on direct                            and retroflexion views. Recommendation:           - Repeat colonoscopy date to be determined after                            pending pathology results are reviewed for                            surveillance based on pathology results.                           - Resume Xarelto (rivaroxaban) in 2 days at prior                            dose. Refer to managing physician for further                            adjustment of therapy.                           - Patient has a contact number available for                            emergencies. The signs and symptoms of potential                            delayed complications were discussed with the                            patient. Return to normal activities tomorrow.                            Written discharge instructions were provided to the                            patient.                           - Resume previous diet adding high fiber.                           - Continue present medications.                           - Await pathology results.                           - Schedule hemorrhoid banding on Xarelto. Meryl Dare, MD 12/07/2022 11:32:12 AM This report has been signed electronically.

## 2022-12-07 NOTE — Progress Notes (Signed)
See 11/23/2022 H&P no changes

## 2022-12-07 NOTE — Progress Notes (Signed)
Pt's states no medical or surgical changes since previsit or office visit. 

## 2022-12-07 NOTE — Progress Notes (Signed)
Called to room to assist during endoscopic procedure.  Patient ID and intended procedure confirmed with present staff. Received instructions for my participation in the procedure from the performing physician.  

## 2022-12-07 NOTE — Telephone Encounter (Signed)
-----   Message from [place] T. Russella Dar sent at 12/07/2022 11:42 AM EDT ----- Marchelle Folks, please schedule pt for hemorrhoid banding. See colonoscopy from today. Lavonna Rua is opening more office time for me in the afternoon on 11/4 and 11/6 as those [facility] afternoons were not filling. Please try to get her 3 appts set before I leave. Can use a new or follow up office slot if banding slots not available. Thx

## 2022-12-07 NOTE — Patient Instructions (Addendum)
Resume Xarelto (rivaroxaban) in 2 days at prior dose -Handout on high fiber diet,polyps, diverticulosis, hemorrhoids and hemorrhoid banding provided -await pathology results -repeat colonoscopy for surveillance recommended. Date to be determined when pathology result become available   -Continue present medications    YOU HAD AN ENDOSCOPIC PROCEDURE TODAY AT THE  ENDOSCOPY CENTER:   Refer to the procedure report that was given to you for any specific questions about what was found during the examination.  If the procedure report does not answer your questions, please call your gastroenterologist to clarify.  If you requested that your care partner not be given the details of your procedure findings, then the procedure report has been included in a sealed envelope for you to review at your convenience later.  YOU SHOULD EXPECT: Some feelings of bloating in the abdomen. Passage of more gas than usual.  Walking can help get rid of the air that was put into your GI tract during the procedure and reduce the bloating. If you had a lower endoscopy (such as a colonoscopy or flexible sigmoidoscopy) you may notice spotting of blood in your stool or on the toilet paper. If you underwent a bowel prep for your procedure, you may not have a normal bowel movement for a few days.  Please Note:  You might notice some irritation and congestion in your nose or some drainage.  This is from the oxygen used during your procedure.  There is no need for concern and it should clear up in a day or so.  SYMPTOMS TO REPORT IMMEDIATELY:  Following lower endoscopy (colonoscopy or flexible sigmoidoscopy):  Excessive amounts of blood in the stool  Significant tenderness or worsening of abdominal pains  Swelling of the abdomen that is new, acute  Fever of 100F or higher   For urgent or emergent issues, a gastroenterologist can be reached at any hour by calling [phone]. Do not use MyChart messaging for urgent  concerns.    DIET:  We do recommend a small meal at first, but then you may proceed to your regular diet.  Drink plenty of fluids but you should avoid alcoholic beverages for 24 hours.  ACTIVITY:  You should plan to take it easy for the rest of today and you should NOT DRIVE or use heavy machinery until tomorrow (because of the sedation medicines used during the test).    FOLLOW UP: Our staff will call the number listed on your records the next business day following your procedure.  We will call around 7:15- 8:00 am to check on you and address any questions or concerns that you may have regarding the information given to you following your procedure. If we do not reach you, we will leave a message.     If any biopsies were taken you will be contacted by phone or by letter within the next 1-3 weeks.  Please call us at [phone] if you have not heard about the biopsies in 3 weeks.    SIGNATURES/CONFIDENTIALITY: You and/or your care partner have signed paperwork which will be entered into your electronic medical record.  These signatures attest to the fact that that the information above on your After Visit Summary has been reviewed and is understood.  Full responsibility of the confidentiality of this discharge information lies with you and/or your care-partner.

## 2022-12-07 NOTE — Progress Notes (Signed)
Sedate, gd SR, tolerated procedure well, VSS, report to RN 

## 2022-12-08 ENCOUNTER — Telehealth: Payer: Self-pay

## 2022-12-08 NOTE — Telephone Encounter (Signed)
  Follow up Call-     12/07/2022   10:36 AM  Call back number  Post procedure Call Back phone  # [phone]  Permission to leave phone message Yes     Patient questions:  Do you have a fever, pain , or abdominal swelling? No. Pain Score  0 *  Have you tolerated food without any problems? Yes.    Have you been able to return to your normal activities? Yes.    Do you have any questions about your discharge instructions: Diet   No. Medications  No. Follow up visit  No.  Do you have questions or concerns about your Care? No.  Actions: * If pain score is 4 or above: No action needed, pain <4.

## 2022-12-09 LAB — SURGICAL PATHOLOGY

## 2022-12-13 ENCOUNTER — Encounter: Payer: Self-pay | Admitting: Gastroenterology

## 2023-01-07 ENCOUNTER — Telehealth: Payer: Self-pay | Admitting: Internal Medicine

## 2023-01-07 NOTE — Telephone Encounter (Signed)
Patient has a cold and wanted to know what OTC medication she can take. She is concerned about possible medication interactions. Patient would like a call back at [phone].

## 2023-01-09 NOTE — Telephone Encounter (Signed)
She can take Coricidin HBP for cold and flu. Thank you

## 2023-01-11 ENCOUNTER — Encounter: Payer: Self-pay | Admitting: Internal Medicine

## 2023-01-11 ENCOUNTER — Ambulatory Visit (INDEPENDENT_AMBULATORY_CARE_PROVIDER_SITE_OTHER): Payer: Medicare Other | Admitting: Internal Medicine

## 2023-01-11 VITALS — BP 140/80 | HR 100 | Temp 98.7°F | Ht 65.5 in | Wt 169.0 lb

## 2023-01-11 DIAGNOSIS — J069 Acute upper respiratory infection, unspecified: Secondary | ICD-10-CM | POA: Diagnosis not present

## 2023-01-11 MED ORDER — BENZONATATE 200 MG PO CAPS: 200.0000 mg | ORAL_CAPSULE | Freq: Three times a day (TID) | ORAL | 0 refills | Status: DC | PRN

## 2023-01-11 MED ORDER — FLUTICASONE PROPIONATE 50 MCG/ACT NA SUSP: 2.0000 | Freq: Every day | NASAL | 6 refills | Status: AC

## 2023-01-11 NOTE — Patient Instructions (Signed)
We have sent in flonase to use 2 sprays on each nostril once a day.  We have sent in tessalon perles to take for cough up to 3 times a day.

## 2023-01-11 NOTE — Progress Notes (Signed)
   Subjective:   Patient ID: Ashley Larsen, female    DOB: [birth year], 72 y.o.   MRN: [id number]  HPI The patient is a 72 YO female coming in for sore throat. Coughing some still. Overall symptoms are improving but not all gone.  Review of Systems  Constitutional:  Negative for activity change, appetite change, chills, fatigue, fever and unexpected weight change.  HENT:  Positive for congestion, postnasal drip, rhinorrhea and sinus pressure. Negative for ear discharge, ear pain, sinus pain, sneezing, sore throat, tinnitus, trouble swallowing and voice change.   Eyes: Negative.   Respiratory:  Positive for cough. Negative for chest tightness, shortness of breath and wheezing.   Cardiovascular: Negative.   Gastrointestinal: Negative.   Musculoskeletal:  Positive for myalgias.  Neurological: Negative.     Objective:  Physical Exam Constitutional:      Appearance: She is well-developed.  HENT:     Head: Normocephalic and atraumatic.     Comments: Oropharynx with redness and clear drainage, nose with swollen turbinates, TMs normal bilaterally.  Neck:     Thyroid: No thyromegaly.  Cardiovascular:     Rate and Rhythm: Normal rate and regular rhythm.  Pulmonary:     Effort: Pulmonary effort is normal. No respiratory distress.     Breath sounds: Normal breath sounds. No wheezing or rales.  Abdominal:     Palpations: Abdomen is soft.  Musculoskeletal:        General: Tenderness present.     Cervical back: Normal range of motion.  Lymphadenopathy:     Cervical: No cervical adenopathy.  Skin:    General: Skin is warm and dry.  Neurological:     Mental Status: She is alert and oriented to person, place, and time.     Vitals:   01/11/23 1302 01/11/23 1308  BP: (!) 140/80 (!) 140/80  Pulse: 100   Temp: 98.7 F (37.1 C)   TempSrc: Oral   SpO2: 98%   Weight: 169 lb (76.7 kg)   Height: 5' 5.5" (1.664 m)     Assessment & Plan:

## 2023-01-11 NOTE — Assessment & Plan Note (Signed)
Symptoms ongoing about 1 week but overall improving. Cough improving and sinus pressure persistent. Rx flonase to help with post nasal drip and congestion. Rx tessalon perles to help with cough.

## 2023-01-12 NOTE — Telephone Encounter (Signed)
Spoke with the pt. Pt was seen and I wanted to check in to see how her cough was doing and has stated the medication is working so far.

## 2023-01-20 ENCOUNTER — Other Ambulatory Visit: Payer: Self-pay | Admitting: Internal Medicine

## 2023-01-20 ENCOUNTER — Telehealth: Payer: Self-pay | Admitting: Internal Medicine
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
## 2023-01-21 ENCOUNTER — Other Ambulatory Visit: Payer: Self-pay

## 2023-01-21 MED ORDER — BENZONATATE 200 MG PO CAPS: 200.0000 mg | ORAL_CAPSULE | Freq: Three times a day (TID) | ORAL | 0 refills | Status: DC | PRN

## 2023-01-21 MED ORDER — HYDROCODONE-ACETAMINOPHEN 5-325 MG PO TABS: 1.0000 | ORAL_TABLET | Freq: Three times a day (TID) | ORAL | 0 refills | Status: DC | PRN

## 2023-01-21 NOTE — Telephone Encounter (Signed)
Spoke with the pt and was able to inform her that her medication is available at the pharmacy.  I have also sent Dr. Loren Racer regards as well.
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
## 2023-01-24 NOTE — Telephone Encounter (Signed)
Patient wants to know if we have any xarelto samples

## 2023-02-01 NOTE — Telephone Encounter (Signed)
Pt was able to get her rx for the xarelto filled and no longer is needing the samples.

## 2023-02-04 ENCOUNTER — Other Ambulatory Visit: Payer: Self-pay | Admitting: Internal Medicine

## 2023-02-23 ENCOUNTER — Ambulatory Visit: Payer: Medicare (Managed Care) | Admitting: Internal Medicine

## 2023-02-23 ENCOUNTER — Encounter: Payer: Self-pay | Admitting: Internal Medicine

## 2023-02-23 VITALS — BP 108/70 | HR 87 | Temp 98.2°F | Ht 65.5 in | Wt 167.0 lb

## 2023-02-23 DIAGNOSIS — M5440 Lumbago with sciatica, unspecified side: Secondary | ICD-10-CM

## 2023-02-23 DIAGNOSIS — R634 Abnormal weight loss: Secondary | ICD-10-CM

## 2023-02-23 DIAGNOSIS — G8929 Other chronic pain: Secondary | ICD-10-CM

## 2023-02-23 DIAGNOSIS — J4489 Other specified chronic obstructive pulmonary disease: Secondary | ICD-10-CM

## 2023-02-23 DIAGNOSIS — J0101 Acute recurrent maxillary sinusitis: Secondary | ICD-10-CM

## 2023-02-23 MED ORDER — BENZONATATE 200 MG PO CAPS: 200.0000 mg | ORAL_CAPSULE | Freq: Three times a day (TID) | ORAL | 1 refills | Status: DC | PRN

## 2023-02-23 MED ORDER — METHYLPREDNISOLONE 4 MG PO TBPK: ORAL_TABLET | ORAL | 0 refills | Status: DC

## 2023-02-23 MED ORDER — FLUCONAZOLE 150 MG PO TABS: 150.0000 mg | ORAL_TABLET | Freq: Once | ORAL | 0 refills | Status: AC

## 2023-02-23 MED ORDER — CEFDINIR 300 MG PO CAPS: ORAL_CAPSULE | ORAL | 0 refills | Status: DC

## 2023-02-23 NOTE — Assessment & Plan Note (Signed)
New Amoxicillin x 10 d Diflucan po prn

## 2023-02-23 NOTE — Assessment & Plan Note (Signed)
Cont on Norco prn ° Potential benefits of a long term opioids use as well as potential risks (i.e. addiction risk, apnea etc) and complications (i.e. Somnolence, constipation and others) were explained to the patient and were aknowledged. °

## 2023-02-23 NOTE — Assessment & Plan Note (Signed)
Medrol pack 

## 2023-02-23 NOTE — Progress Notes (Signed)
 Subjective:  Patient ID: Ashley Larsen, female    DOB: 1950/02/29  Age: 73 y.o. MRN: [id number]  CC: Medical Management of Chronic Issues (3 mnth f/u, Sinus headache, congestion and a cough)   HPI Ashley Larsen presents for LBP, sinus congestion x 1 week, cough  Outpatient Medications Prior to Visit  Medication Sig Dispense Refill  . amLODipine  (NORVASC ) 5 MG tablet TAKE 1 TABLET(5 MG) BY MOUTH DAILY 90 tablet 2  . Artificial Tear Solution (GENTEAL TEARS) 0.1-0.2-0.3 % SOLN Place 1 drop into both eyes daily as needed (Dry eye).    . Cholecalciferol (VITAMIN D3) 50 MCG (2000 UT) capsule Take 1 capsule (2,000 Units total) by mouth daily. 100 capsule 3  . docusate sodium  (COLACE) 100 MG capsule Take 1 capsule (100 mg total) by mouth every 12 (twelve) hours. 60 capsule 0  . fexofenadine  (ALLEGRA ) 180 MG tablet Take 1 tablet (180 mg total) by mouth daily as needed for allergies. 90 tablet 3  . fluconazole  (DIFLUCAN ) 150 MG tablet TAKE 1 TABLET(150 MG) BY MOUTH 1 TIME FOR 1 DOSE 1 tablet 1  . fluticasone  (FLONASE ) 50 MCG/ACT nasal spray Place 2 sprays into both nostrils daily. 16 g 6  . gabapentin  (NEURONTIN ) 300 MG capsule Take 1 capsule (300 mg total) by mouth at bedtime as needed. 30 capsule 3  . HYDROcodone -acetaminophen  (NORCO/VICODIN) 5-325 MG tablet Take 1 tablet by mouth every 8 (eight) hours as needed for severe pain (pain score 7-10). 90 tablet 0  . levothyroxine  (SYNTHROID ) 100 MCG tablet TAKE 1 TABLET BY MOUTH BEFORE BREAKFAST 90 tablet 4  . losartan  (COZAAR ) 100 MG tablet TAKE 1 TABLET(100 MG) BY MOUTH DAILY 90 tablet 3  . Na Sulfate-K Sulfate-Mg Sulf 17.5-3.13-1.6 GM/177ML SOLN SMARTSIG:1 Kit(s) By Mouth Once    . omeprazole  (PRILOSEC) 20 MG capsule Take 1 capsule (20 mg total) by mouth daily. 30 capsule 0  . potassium chloride  (KLOR-CON ) 10 MEQ tablet TAKE 1 TABLET(10 MEQ) BY MOUTH DAILY 90 tablet 1  . rivaroxaban  (XARELTO ) 20 MG TABS tablet TAKE 1 TABLET(20 MG) BY MOUTH DAILY  90 tablet 3  . benzonatate  (TESSALON ) 200 MG capsule Take 1 capsule (200 mg total) by mouth 3 (three) times daily as needed. 60 capsule 0   No facility-administered medications prior to visit.    ROS: Review of Systems  Objective:  BP 108/70 (BP Location: Left Arm, Patient Position: Sitting, Cuff Size: Normal)   Pulse 87   Temp 98.2 F (36.8 C) (Oral)   Ht 5' 5.5 (1.664 m)   Wt 167 lb (75.8 kg)   SpO2 93%   BMI 27.37 kg/m   BP Readings from Last 3 Encounters:  02/23/23 108/70  01/11/23 (!) 140/80  12/07/22 139/76    Wt Readings from Last 3 Encounters:  02/23/23 167 lb (75.8 kg)  01/11/23 169 lb (76.7 kg)  12/07/22 170 lb (77.1 kg)    Physical Exam  Lab Results  Component Value Date   WBC 8.1 11/05/2022   HGB 11.8 (L) 11/05/2022   HCT 35.3 (L) 11/05/2022   PLT 284 11/05/2022   GLUCOSE 133 (H) 11/05/2022   CHOL 249 (A) 06/11/2021   TRIG 109 06/11/2021   HDL 48 06/03/2020   LDLDIRECT 185.7 01/19/2012   LDLCALC 179 06/11/2021   ALT 24 11/05/2022   AST 22 11/05/2022   NA 139 11/05/2022   K 3.7 11/05/2022   CL 109 11/05/2022   CREATININE 0.93 11/05/2022   BUN 15 11/05/2022   CO2  23 11/05/2022   TSH 2.13 10/21/2022   INR 1.0 07/24/2019   HGBA1C 5.6 06/11/2021    No results found.  Assessment & Plan:   Problem List Items Addressed This Visit     COPD with chronic bronchitis (HCC)   Medrol  pack      Relevant Medications   benzonatate  (TESSALON ) 200 MG capsule   methylPREDNISolone  (MEDROL  DOSEPAK) 4 MG TBPK tablet   Acute sinusitis   New Amoxicillin  x 10 d Diflucan  po prn      Relevant Medications   benzonatate  (TESSALON ) 200 MG capsule   cefdinir  (OMNICEF ) 300 MG capsule   fluconazole  (DIFLUCAN ) 150 MG tablet   methylPREDNISolone  (MEDROL  DOSEPAK) 4 MG TBPK tablet   Low back pain   Cont on Norco prn  Potential benefits of a long term opioids use as well as potential risks (i.e. addiction risk, apnea etc) and complications (i.e. Somnolence,  constipation and others) were explained to the patient and were aknowledged.       Relevant Medications   methylPREDNISolone  (MEDROL  DOSEPAK) 4 MG TBPK tablet   Weight loss - Primary   On diet         Meds ordered this encounter  Medications  . benzonatate  (TESSALON ) 200 MG capsule    Sig: Take 1 capsule (200 mg total) by mouth 3 (three) times daily as needed.    Dispense:  60 capsule    Refill:  1  . cefdinir  (OMNICEF ) 300 MG capsule    Sig: 2 capsules each morning x 1 week    Dispense:  14 capsule    Refill:  0  . fluconazole  (DIFLUCAN ) 150 MG tablet    Sig: Take 1 tablet (150 mg total) by mouth once for 1 dose.    Dispense:  1 tablet    Refill:  0  . methylPREDNISolone  (MEDROL  DOSEPAK) 4 MG TBPK tablet    Sig: As directed    Dispense:  21 tablet    Refill:  0      Follow-up: No follow-ups on file.  Marolyn Noel, MD

## 2023-02-23 NOTE — Assessment & Plan Note (Signed)
  On diet  

## 2023-04-15 ENCOUNTER — Other Ambulatory Visit: Payer: Self-pay | Admitting: Internal Medicine

## 2023-04-18 ENCOUNTER — Other Ambulatory Visit: Payer: Self-pay | Admitting: Internal Medicine

## 2023-04-18 MED ORDER — HYDROCODONE-ACETAMINOPHEN 5-325 MG PO TABS: 1.0000 | ORAL_TABLET | Freq: Three times a day (TID) | ORAL | 0 refills | Status: DC | PRN

## 2023-04-18 NOTE — Telephone Encounter (Signed)
 Copied from CRM [id number]. Topic: Clinical - Medication Refill >> Apr 18, 2023  2:53 PM Gurney Maxin H wrote: Most Recent Primary Care Visit:  Provider: Tresa Garter  Department: [facility]  Visit Type: OFFICE VISIT  Date: 02/23/2023  Medication: HYDROcodone-acetaminophen (NORCO/VICODIN) 5-325 MG tablet   Has the patient contacted their pharmacy? No, controlled substance (Agent: If no, request that the patient contact the pharmacy for the refill. If patient does not wish to contact the pharmacy document the reason why and proceed with request.) (Agent: If yes, when and what did the pharmacy advise?)  Is this the correct pharmacy for this prescription? Yes If no, delete pharmacy and type the correct one.  This is the patient's preferred pharmacy:  [facility] [address] [place], [place] - [address] AT [place] [address] [place] [address] Phone: [phone] Fax: [phone]    Has the prescription been filled recently? No  Is the patient out of the medication? Yes  Has the patient been seen for an appointment in the last year OR does the patient have an upcoming appointment? Yes  Can we respond through MyChart? Yes  Agent: Please be advised that Rx refills may take up to 3 business days. We ask that you follow-up with your pharmacy.

## 2023-04-21 ENCOUNTER — Telehealth: Payer: Self-pay

## 2023-04-21 ENCOUNTER — Other Ambulatory Visit (HOSPITAL_COMMUNITY): Payer: Self-pay

## 2023-04-21 NOTE — Telephone Encounter (Signed)
 Pharmacy Patient Advocate Encounter  Received notification from  [employer] [facility] [employer]  that Prior Authorization for HYDROcodone-Acetaminophen 5-325MG  tablets has been APPROVED from 03/22/23 to 04/20/24. Ran test claim, Copay is $26.37. This test claim was processed through [facility]- copay amounts may vary at other pharmacies due to pharmacy/plan contracts, or as the patient moves through the different stages of their insurance plan.   PA #/Case ID/Reference #: [id number]

## 2023-04-21 NOTE — Telephone Encounter (Signed)
 Pharmacy Patient Advocate Encounter   Received notification from  [facility] Portal that prior authorization for HYDROcodone-Acetaminophen 5-325MG  tablets is required/requested.   Insurance verification completed.   The patient is insured through  [employer]  .   Per test claim: PA required; PA submitted to above mentioned insurance via CoverMyMeds Key/confirmation #/EOC [id number] Status is pending

## 2023-05-04 ENCOUNTER — Ambulatory Visit: Payer: Medicare (Managed Care)

## 2023-05-04 VITALS — Ht 65.5 in | Wt 167.0 lb

## 2023-05-04 DIAGNOSIS — Z0000 Encounter for general adult medical examination without abnormal findings: Secondary | ICD-10-CM | POA: Diagnosis not present

## 2023-05-04 DIAGNOSIS — Z780 Asymptomatic menopausal state: Secondary | ICD-10-CM

## 2023-05-04 NOTE — Patient Instructions (Addendum)
 Ms. Ashley Larsen , Thank you for taking time to come for your Medicare Wellness Visit. I appreciate your ongoing commitment to your health goals. Please review the following plan we discussed and let me know if I can assist you in the future.   Referrals/Orders/Follow-Ups/Clinician Recommendations: It was nice talking to you today.  Each day, aim for 6 glasses of water, plenty of protein in your diet and try to get up and walk/ stretch every hour for 5-10 minutes at a time.  You are due for a Shingles vaccine and a Tdap vaccine.  You have an order for:  [x]   Bone Density     Please call for appointment:  [facility] [address] [place], [place] [address] [phone]  Make sure to wear two-piece clothing.  No lotions, powders, or deodorants the day of the appointment. Make sure to bring picture ID and insurance card.  Bring list of medications you are currently taking including any supplements.    This is a list of the screening recommended for you and due dates:  Health Maintenance  Topic Date Due   Medicare Annual Wellness Visit  Never done   DEXA scan (bone density measurement)  Never done   COVID-19 Vaccine (4 - 2024-25 season) 10/17/2022   DTaP/Tdap/Td vaccine (2 - Td or Tdap) 02/01/2023   Mammogram  01/30/2024   Colon Cancer Screening  12/07/2027   Pneumonia Vaccine  Completed   Flu Shot  Completed   Hepatitis C Screening  Completed   HPV Vaccine  Aged Out   Zoster (Shingles) Vaccine  Discontinued    Advanced directives: (Copy Requested) Please bring a copy of your health care power of attorney and living will to the office to be added to your chart at your convenience. You can mail to [facility] [address] [place], [place] [address] or email to [email or web address]  Next Medicare Annual Wellness Visit scheduled for next year: Yes
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
## 2023-05-05 DIAGNOSIS — M1711 Unilateral primary osteoarthritis, right knee: Secondary | ICD-10-CM | POA: Diagnosis not present

## 2023-05-25 ENCOUNTER — Ambulatory Visit: Payer: Medicare (Managed Care) | Admitting: Internal Medicine

## 2023-06-06 ENCOUNTER — Ambulatory Visit (INDEPENDENT_AMBULATORY_CARE_PROVIDER_SITE_OTHER): Payer: Medicare (Managed Care) | Admitting: Internal Medicine

## 2023-06-06 ENCOUNTER — Encounter: Payer: Self-pay | Admitting: Internal Medicine

## 2023-06-06 VITALS — BP 92/54 | HR 92 | Temp 98.3°F | Ht 65.5 in | Wt 169.4 lb

## 2023-06-06 DIAGNOSIS — I10 Essential (primary) hypertension: Secondary | ICD-10-CM | POA: Diagnosis not present

## 2023-06-06 DIAGNOSIS — M5441 Lumbago with sciatica, right side: Secondary | ICD-10-CM

## 2023-06-06 DIAGNOSIS — K5792 Diverticulitis of intestine, part unspecified, without perforation or abscess without bleeding: Secondary | ICD-10-CM

## 2023-06-06 DIAGNOSIS — M5442 Lumbago with sciatica, left side: Secondary | ICD-10-CM

## 2023-06-06 DIAGNOSIS — G8929 Other chronic pain: Secondary | ICD-10-CM

## 2023-06-06 DIAGNOSIS — E0500 Thyrotoxicosis with diffuse goiter without thyrotoxic crisis or storm: Secondary | ICD-10-CM | POA: Diagnosis not present

## 2023-06-06 MED ORDER — FLUCONAZOLE 150 MG PO TABS: 150.0000 mg | ORAL_TABLET | Freq: Once | ORAL | 1 refills | Status: AC

## 2023-06-06 MED ORDER — AMOXICILLIN-POT CLAVULANATE 875-125 MG PO TABS: 1.0000 | ORAL_TABLET | Freq: Two times a day (BID) | ORAL | 0 refills | Status: DC

## 2023-06-06 MED ORDER — HYDROCODONE-ACETAMINOPHEN 5-325 MG PO TABS: 1.0000 | ORAL_TABLET | Freq: Three times a day (TID) | ORAL | 0 refills | Status: DC | PRN

## 2023-06-06 MED ORDER — GABAPENTIN 300 MG PO CAPS: 300.0000 mg | ORAL_CAPSULE | Freq: Every evening | ORAL | 1 refills | Status: AC | PRN

## 2023-06-06 NOTE — Assessment & Plan Note (Signed)
Cont on Norco prn ° Potential benefits of a long term opioids use as well as potential risks (i.e. addiction risk, apnea etc) and complications (i.e. Somnolence, constipation and others) were explained to the patient and were aknowledged. °

## 2023-06-06 NOTE — Assessment & Plan Note (Addendum)
 Recurrent, always in the RLQ per pt. No h/o appendicitis Augmentin , Diflucan  prn if worse To ER if sick (fever, n/v)

## 2023-06-06 NOTE — Assessment & Plan Note (Signed)
 Endocrinology f/u S/p surgery at [facility]

## 2023-06-06 NOTE — Assessment & Plan Note (Signed)
 Low BP - no sx's Hold amlodipine  today Drink more

## 2023-06-06 NOTE — Progress Notes (Signed)
 Subjective:  Patient ID: Ashley Larsen, female    DOB: 1950/03/07  Age: 73 y.o. MRN: [id number]  CC: Medical Management of Chronic Issues (3 Month follow up. Patient noted of some right flank pain Saturday and Sunday, has since cleared up, past history of diverticulitis. Previously prescribed gabapentin  for this pain and patient would like to know if she can have any for PRN use if pain was to come up again)   HPI Ashley Larsen presents for a possible diverticulitis x a few days; better this am F/u on LBP - out of Gabapentin  F/u on hypothyroidism   Outpatient Medications Prior to Visit  Medication Sig Dispense Refill   amLODipine  (NORVASC ) 5 MG tablet TAKE 1 TABLET(5 MG) BY MOUTH DAILY 90 tablet 2   Artificial Tear Solution (GENTEAL TEARS) 0.1-0.2-0.3 % SOLN Place 1 drop into both eyes daily as needed (Dry eye).     benzonatate  (TESSALON ) 200 MG capsule Take 1 capsule (200 mg total) by mouth 3 (three) times daily as needed. 60 capsule 1   Cholecalciferol (VITAMIN D3) 50 MCG (2000 UT) capsule Take 1 capsule (2,000 Units total) by mouth daily. 100 capsule 3   docusate sodium  (COLACE) 100 MG capsule Take 1 capsule (100 mg total) by mouth every 12 (twelve) hours. 60 capsule 0   fexofenadine  (ALLEGRA ) 180 MG tablet Take 1 tablet (180 mg total) by mouth daily as needed for allergies. 90 tablet 3   fluticasone  (FLONASE ) 50 MCG/ACT nasal spray Place 2 sprays into both nostrils daily. 16 g 6   levothyroxine  (SYNTHROID ) 100 MCG tablet TAKE 1 TABLET BY MOUTH BEFORE BREAKFAST 90 tablet 4   losartan  (COZAAR ) 100 MG tablet TAKE 1 TABLET(100 MG) BY MOUTH DAILY 90 tablet 3   Na Sulfate-K Sulfate-Mg Sulf 17.5-3.13-1.6 GM/177ML SOLN SMARTSIG:1 Kit(s) By Mouth Once     omeprazole  (PRILOSEC) 20 MG capsule Take 1 capsule (20 mg total) by mouth daily. 30 capsule 0   potassium chloride  (KLOR-CON ) 10 MEQ tablet TAKE 1 TABLET(10 MEQ) BY MOUTH DAILY 90 tablet 1   rivaroxaban  (XARELTO ) 20 MG TABS tablet TAKE 1  TABLET(20 MG) BY MOUTH DAILY 90 tablet 3   cefdinir  (OMNICEF ) 300 MG capsule 2 capsules each morning x 1 week 14 capsule 0   fluconazole  (DIFLUCAN ) 150 MG tablet TAKE 1 TABLET(150 MG) BY MOUTH 1 TIME FOR 1 DOSE 1 tablet 1   gabapentin  (NEURONTIN ) 300 MG capsule Take 1 capsule (300 mg total) by mouth at bedtime as needed. 30 capsule 3   HYDROcodone -acetaminophen  (NORCO/VICODIN) 5-325 MG tablet Take 1 tablet by mouth every 8 (eight) hours as needed for severe pain (pain score 7-10). 90 tablet 0   methylPREDNISolone  (MEDROL  DOSEPAK) 4 MG TBPK tablet As directed 21 tablet 0   No facility-administered medications prior to visit.    ROS: Review of Systems  Constitutional:  Positive for fatigue. Negative for activity change, appetite change, chills, fever and unexpected weight change.  HENT:  Negative for congestion, mouth sores and sinus pressure.   Eyes:  Negative for visual disturbance.  Respiratory:  Negative for cough and chest tightness.   Cardiovascular:  Negative for leg swelling.  Gastrointestinal:  Negative for abdominal pain, nausea and vomiting.  Genitourinary:  Negative for difficulty urinating, frequency and vaginal pain.  Musculoskeletal:  Positive for back pain. Negative for gait problem.  Skin:  Negative for pallor and rash.  Neurological:  Negative for dizziness, tremors, weakness, numbness and headaches.  Psychiatric/Behavioral:  Negative for confusion, sleep disturbance and  suicidal ideas.     Objective:  BP (!) 92/54   Pulse 92   Temp 98.3 F (36.8 C)   Ht 5' 5.5" (1.664 m)   Wt 169 lb 6.4 oz (76.8 kg)   SpO2 99%   BMI 27.76 kg/m   BP Readings from Last 3 Encounters:  06/06/23 (!) 92/54  02/23/23 108/70  01/11/23 (!) 140/80    Wt Readings from Last 3 Encounters:  06/06/23 169 lb 6.4 oz (76.8 kg)  05/04/23 167 lb (75.8 kg)  02/23/23 167 lb (75.8 kg)    Physical Exam Constitutional:      General: She is not in acute distress.    Appearance: She is  well-developed. She is obese.  HENT:     Head: Normocephalic.     Right Ear: External ear normal.     Left Ear: External ear normal.     Nose: Nose normal.  Eyes:     General:        Right eye: No discharge.        Left eye: No discharge.     Conjunctiva/sclera: Conjunctivae normal.     Pupils: Pupils are equal, round, and reactive to light.  Neck:     Thyroid : No thyromegaly.     Vascular: No JVD.     Trachea: No tracheal deviation.  Cardiovascular:     Rate and Rhythm: Normal rate and regular rhythm.     Heart sounds: Normal heart sounds.  Pulmonary:     Effort: No respiratory distress.     Breath sounds: No stridor. No wheezing.  Abdominal:     General: Bowel sounds are normal. There is no distension.     Palpations: Abdomen is soft. There is no mass.     Tenderness: There is no abdominal tenderness. There is no guarding or rebound.  Musculoskeletal:        General: Tenderness present.     Cervical back: Normal range of motion and neck supple. No rigidity.     Right lower leg: No edema.     Left lower leg: No edema.  Lymphadenopathy:     Cervical: No cervical adenopathy.  Skin:    Findings: No erythema or rash.  Neurological:     Cranial Nerves: No cranial nerve deficit.     Motor: No abnormal muscle tone.     Coordination: Coordination normal.     Deep Tendon Reflexes: Reflexes normal.  Psychiatric:        Behavior: Behavior normal.        Thought Content: Thought content normal.        Judgment: Judgment normal.   Abd - s/nt all over. No HSM, no mass  LS spine w/pain  Lab Results  Component Value Date   WBC 8.1 11/05/2022   HGB 11.8 (L) 11/05/2022   HCT 35.3 (L) 11/05/2022   PLT 284 11/05/2022   GLUCOSE 133 (H) 11/05/2022   CHOL 249 (A) 06/11/2021   TRIG 109 06/11/2021   HDL 48 06/03/2020   LDLDIRECT 185.7 01/19/2012   LDLCALC 179 06/11/2021   ALT 24 11/05/2022   AST 22 11/05/2022   NA 139 11/05/2022   K 3.7 11/05/2022   CL 109 11/05/2022    CREATININE 0.93 11/05/2022   BUN 15 11/05/2022   CO2 23 11/05/2022   TSH 2.13 10/21/2022   INR 1.0 07/24/2019   HGBA1C 5.6 06/11/2021    No results found.  Assessment & Plan:   Problem List Items Addressed  This Visit     Essential hypertension - Primary   Low BP - no sx's Hold amlodipine  today Drink more      Low back pain   Cont on Norco prn  Potential benefits of a long term opioids use as well as potential risks (i.e. addiction risk, apnea etc) and complications (i.e. Somnolence, constipation and others) were explained to the patient and were aknowledged.       Relevant Medications   HYDROcodone -acetaminophen  (NORCO/VICODIN) 5-325 MG tablet   Graves' ophthalmopathy   Endocrinology f/u S/p surgery at [facility]      Diverticulitis   Recurrent, always in the RLQ per pt. No h/o appendicitis Augmentin , Diflucan  prn if worse To ER if sick (fever, n/v)         Meds ordered this encounter  Medications   amoxicillin -clavulanate (AUGMENTIN ) 875-125 MG tablet    Sig: Take 1 tablet by mouth 2 (two) times daily.    Dispense:  20 tablet    Refill:  0   fluconazole  (DIFLUCAN ) 150 MG tablet    Sig: Take 1 tablet (150 mg total) by mouth once for 1 dose.    Dispense:  1 tablet    Refill:  1   gabapentin  (NEURONTIN ) 300 MG capsule    Sig: Take 1 capsule (300 mg total) by mouth at bedtime as needed.    Dispense:  90 capsule    Refill:  1   HYDROcodone -acetaminophen  (NORCO/VICODIN) 5-325 MG tablet    Sig: Take 1 tablet by mouth every 8 (eight) hours as needed for severe pain (pain score 7-10).    Dispense:  90 tablet    Refill:  0      Follow-up: Return in about 3 months (around 09/05/2023) for a follow-up visit.  Anitra Barn, MD

## 2023-08-08 ENCOUNTER — Other Ambulatory Visit: Payer: Self-pay | Admitting: Internal Medicine

## 2023-09-05 ENCOUNTER — Encounter: Payer: Self-pay | Admitting: Internal Medicine

## 2023-09-05 ENCOUNTER — Ambulatory Visit (INDEPENDENT_AMBULATORY_CARE_PROVIDER_SITE_OTHER): Payer: Medicare (Managed Care) | Admitting: Internal Medicine

## 2023-09-05 VITALS — BP 127/79 | HR 79 | Temp 98.6°F | Ht 65.5 in | Wt 169.0 lb

## 2023-09-05 DIAGNOSIS — I10 Essential (primary) hypertension: Secondary | ICD-10-CM

## 2023-09-05 DIAGNOSIS — M5441 Lumbago with sciatica, right side: Secondary | ICD-10-CM

## 2023-09-05 DIAGNOSIS — M79604 Pain in right leg: Secondary | ICD-10-CM

## 2023-09-05 DIAGNOSIS — M79605 Pain in left leg: Secondary | ICD-10-CM

## 2023-09-05 DIAGNOSIS — J4489 Other specified chronic obstructive pulmonary disease: Secondary | ICD-10-CM

## 2023-09-05 DIAGNOSIS — M5442 Lumbago with sciatica, left side: Secondary | ICD-10-CM

## 2023-09-05 DIAGNOSIS — G8929 Other chronic pain: Secondary | ICD-10-CM

## 2023-09-05 DIAGNOSIS — Z0000 Encounter for general adult medical examination without abnormal findings: Secondary | ICD-10-CM

## 2023-09-05 MED ORDER — HYDROCODONE-ACETAMINOPHEN 5-325 MG PO TABS: 1.0000 | ORAL_TABLET | Freq: Three times a day (TID) | ORAL | 0 refills | Status: DC | PRN

## 2023-09-05 NOTE — Assessment & Plan Note (Signed)
 Cont on Losartan , Norvasc  Drink more

## 2023-09-05 NOTE — Progress Notes (Signed)
 Subjective:  Patient ID: Ashley Larsen, female    DOB: 31-Dec-1950  Age: 73 y.o. MRN: [id number]  CC: Medical Management of Chronic Issues (3 mnth f/u )   HPI Zeina Akkerman presents for OA, LBP F/u on HTN, anticoagulation  Outpatient Medications Prior to Visit  Medication Sig Dispense Refill   amLODipine  (NORVASC ) 5 MG tablet TAKE 1 TABLET(5 MG) BY MOUTH DAILY 90 tablet 2   Artificial Tear Solution (GENTEAL TEARS) 0.1-0.2-0.3 % SOLN Place 1 drop into both eyes daily as needed (Dry eye).     Cholecalciferol (VITAMIN D3) 50 MCG (2000 UT) capsule Take 1 capsule (2,000 Units total) by mouth daily. 100 capsule 3   docusate sodium  (COLACE) 100 MG capsule Take 1 capsule (100 mg total) by mouth every 12 (twelve) hours. 60 capsule 0   fexofenadine  (ALLEGRA ) 180 MG tablet Take 1 tablet (180 mg total) by mouth daily as needed for allergies. 90 tablet 3   fluticasone  (FLONASE ) 50 MCG/ACT nasal spray Place 2 sprays into both nostrils daily. 16 g 6   gabapentin  (NEURONTIN ) 300 MG capsule Take 1 capsule (300 mg total) by mouth at bedtime as needed. 90 capsule 1   levothyroxine  (SYNTHROID ) 100 MCG tablet TAKE 1 TABLET BY MOUTH BEFORE BREAKFAST 90 tablet 4   losartan  (COZAAR ) 100 MG tablet TAKE 1 TABLET(100 MG) BY MOUTH DAILY 90 tablet 3   Na Sulfate-K Sulfate-Mg Sulf 17.5-3.13-1.6 GM/177ML SOLN SMARTSIG:1 Kit(s) By Mouth Once     omeprazole  (PRILOSEC) 20 MG capsule Take 1 capsule (20 mg total) by mouth daily. 30 capsule 0   potassium chloride  (KLOR-CON ) 10 MEQ tablet TAKE 1 TABLET(10 MEQ) BY MOUTH DAILY 90 tablet 1   rivaroxaban  (XARELTO ) 20 MG TABS tablet TAKE 1 TABLET(20 MG) BY MOUTH DAILY 90 tablet 3   amoxicillin -clavulanate (AUGMENTIN ) 875-125 MG tablet Take 1 tablet by mouth 2 (two) times daily. 20 tablet 0   benzonatate  (TESSALON ) 200 MG capsule Take 1 capsule (200 mg total) by mouth 3 (three) times daily as needed. 60 capsule 1   HYDROcodone -acetaminophen  (NORCO/VICODIN) 5-325 MG tablet Take 1  tablet by mouth every 8 (eight) hours as needed for severe pain (pain score 7-10). 90 tablet 0   No facility-administered medications prior to visit.    ROS: Review of Systems  Constitutional:  Negative for activity change, appetite change, chills, fatigue and unexpected weight change.  HENT:  Negative for congestion, mouth sores and sinus pressure.   Eyes:  Negative for visual disturbance.  Respiratory:  Negative for cough and chest tightness.   Cardiovascular:  Negative for leg swelling.  Gastrointestinal:  Negative for abdominal pain and nausea.  Genitourinary:  Negative for difficulty urinating, frequency and vaginal pain.  Musculoskeletal:  Positive for arthralgias, back pain and gait problem.  Skin:  Negative for pallor and rash.  Neurological:  Negative for dizziness, tremors, weakness, numbness and headaches.  Psychiatric/Behavioral:  Negative for confusion, self-injury and sleep disturbance.     Objective:  BP 127/79   Pulse 79   Temp 98.6 F (37 C) (Oral)   Ht 5' 5.5 (1.664 m)   Wt 169 lb (76.7 kg)   SpO2 98%   BMI 27.70 kg/m   BP Readings from Last 3 Encounters:  09/05/23 127/79  06/06/23 (!) 92/54  02/23/23 108/70    Wt Readings from Last 3 Encounters:  09/05/23 169 lb (76.7 kg)  06/06/23 169 lb 6.4 oz (76.8 kg)  05/04/23 167 lb (75.8 kg)    Physical Exam Constitutional:  General: She is not in acute distress.    Appearance: She is well-developed.  HENT:     Head: Normocephalic.     Right Ear: External ear normal.     Left Ear: External ear normal.     Nose: Nose normal.  Eyes:     General:        Right eye: No discharge.        Left eye: No discharge.     Conjunctiva/sclera: Conjunctivae normal.     Pupils: Pupils are equal, round, and reactive to light.  Neck:     Thyroid : No thyromegaly.     Vascular: No JVD.     Trachea: No tracheal deviation.  Cardiovascular:     Rate and Rhythm: Normal rate and regular rhythm.     Heart sounds:  Normal heart sounds.  Pulmonary:     Effort: No respiratory distress.     Breath sounds: No stridor. No wheezing.  Abdominal:     General: Bowel sounds are normal. There is no distension.     Palpations: Abdomen is soft. There is no mass.     Tenderness: There is no abdominal tenderness. There is no guarding or rebound.  Musculoskeletal:        General: Tenderness present.     Cervical back: Normal range of motion and neck supple. No rigidity.     Right lower leg: No edema.     Left lower leg: No edema.  Lymphadenopathy:     Cervical: No cervical adenopathy.  Skin:    Findings: No erythema or rash.  Neurological:     Mental Status: She is oriented to person, place, and time.     Cranial Nerves: No cranial nerve deficit.     Motor: No abnormal muscle tone.     Coordination: Coordination normal.     Deep Tendon Reflexes: Reflexes normal.  Psychiatric:        Behavior: Behavior normal.        Thought Content: Thought content normal.        Judgment: Judgment normal.   R knee w/pain  Lab Results  Component Value Date   WBC 8.1 11/05/2022   HGB 11.8 (L) 11/05/2022   HCT 35.3 (L) 11/05/2022   PLT 284 11/05/2022   GLUCOSE 133 (H) 11/05/2022   CHOL 249 (A) 06/11/2021   TRIG 109 06/11/2021   HDL 48 06/03/2020   LDLDIRECT 185.7 01/19/2012   LDLCALC 179 06/11/2021   ALT 24 11/05/2022   AST 22 11/05/2022   NA 139 11/05/2022   K 3.7 11/05/2022   CL 109 11/05/2022   CREATININE 0.93 11/05/2022   BUN 15 11/05/2022   CO2 23 11/05/2022   TSH 2.13 10/21/2022   INR 1.0 07/24/2019   HGBA1C 5.6 06/11/2021    No results found.  Assessment & Plan:   Problem List Items Addressed This Visit     COPD with chronic bronchitis (HCC)   Doing well now      Essential hypertension   Cont on Losartan , Norvasc  Drink more      LEG PAIN - Primary   B knee OA Norco prn  Potential benefits of a long term opioids use as well as potential risks (i.e. addiction risk, apnea etc) and  complications (i.e. Somnolence, constipation and others) were explained to the patient and were aknowledged.      Low back pain   Doing well today Norco prn  Potential benefits of a long term opioids use  as well as potential risks (i.e. addiction risk, apnea etc) and complications (i.e. Somnolence, constipation and others) were explained to the patient and were aknowledged.      Relevant Medications   HYDROcodone -acetaminophen  (NORCO/VICODIN) 5-325 MG tablet      Meds ordered this encounter  Medications   HYDROcodone -acetaminophen  (NORCO/VICODIN) 5-325 MG tablet    Sig: Take 1 tablet by mouth every 8 (eight) hours as needed for severe pain (pain score 7-10).    Dispense:  90 tablet    Refill:  0      Follow-up: Return in about 3 months (around 12/06/2023) for a follow-up visit.  Marolyn Noel, MD

## 2023-09-05 NOTE — Assessment & Plan Note (Addendum)
 Doing well today Norco prn  Potential benefits of a long term opioids use as well as potential risks (i.e. addiction risk, apnea etc) and complications (i.e. Somnolence, constipation and others) were explained to the patient and were aknowledged.

## 2023-09-05 NOTE — Assessment & Plan Note (Signed)
 Doing well now

## 2023-09-05 NOTE — Assessment & Plan Note (Signed)
B knee OA Norco prn  Potential benefits of a long term opioids use as well as potential risks (i.e. addiction risk, apnea etc) and complications (i.e. Somnolence, constipation and others) were explained to the patient and were aknowledged.

## 2023-10-24 ENCOUNTER — Encounter: Payer: Self-pay | Admitting: Endocrinology

## 2023-10-24 ENCOUNTER — Other Ambulatory Visit (INDEPENDENT_AMBULATORY_CARE_PROVIDER_SITE_OTHER): Payer: Medicare (Managed Care)

## 2023-10-24 ENCOUNTER — Ambulatory Visit: Payer: Medicare (Managed Care) | Admitting: Endocrinology

## 2023-10-24 VITALS — BP 136/80 | HR 82 | Resp 20 | Ht 65.5 in | Wt 169.4 lb

## 2023-10-24 DIAGNOSIS — E0500 Thyrotoxicosis with diffuse goiter without thyrotoxic crisis or storm: Secondary | ICD-10-CM | POA: Diagnosis not present

## 2023-10-24 DIAGNOSIS — Z8639 Personal history of other endocrine, nutritional and metabolic disease: Secondary | ICD-10-CM

## 2023-10-24 DIAGNOSIS — E890 Postprocedural hypothyroidism: Secondary | ICD-10-CM | POA: Diagnosis not present

## 2023-10-24 DIAGNOSIS — I10 Essential (primary) hypertension: Secondary | ICD-10-CM

## 2023-10-24 DIAGNOSIS — Z0000 Encounter for general adult medical examination without abnormal findings: Secondary | ICD-10-CM | POA: Diagnosis not present

## 2023-10-24 LAB — CBC WITH DIFFERENTIAL/PLATELET
Basophils Absolute: 0 K/uL (ref 0.0–0.1)
Basophils Relative: 0.5 % (ref 0.0–3.0)
Eosinophils Absolute: 0.1 K/uL (ref 0.0–0.7)
Eosinophils Relative: 2.2 % (ref 0.0–5.0)
HCT: 37.1 % (ref 36.0–46.0)
Hemoglobin: 12.4 g/dL (ref 12.0–15.0)
Lymphocytes Relative: 36.5 % (ref 12.0–46.0)
Lymphs Abs: 1.6 K/uL (ref 0.7–4.0)
MCHC: 33.5 g/dL (ref 30.0–36.0)
MCV: 86.4 fl (ref 78.0–100.0)
Monocytes Absolute: 0.4 K/uL (ref 0.1–1.0)
Monocytes Relative: 9.5 % (ref 3.0–12.0)
Neutro Abs: 2.2 K/uL (ref 1.4–7.7)
Neutrophils Relative %: 51.3 % (ref 43.0–77.0)
Platelets: 258 K/uL (ref 150.0–400.0)
RBC: 4.29 Mil/uL (ref 3.87–5.11)
RDW: 14.8 % (ref 11.5–15.5)
WBC: 4.3 K/uL (ref 4.0–10.5)

## 2023-10-24 LAB — URINALYSIS
Bilirubin Urine: NEGATIVE
Hgb urine dipstick: NEGATIVE
Ketones, ur: NEGATIVE
Leukocytes,Ua: NEGATIVE
Nitrite: NEGATIVE
Specific Gravity, Urine: 1.02 (ref 1.000–1.030)
Urine Glucose: NEGATIVE
Urobilinogen, UA: 0.2 (ref 0.0–1.0)
pH: 6 (ref 5.0–8.0)

## 2023-10-24 LAB — LIPID PANEL
Cholesterol: 245 mg/dL — ABNORMAL HIGH (ref 0–200)
HDL: 47.3 mg/dL (ref >39.00)
LDL Cholesterol: 173 mg/dL — ABNORMAL HIGH (ref 0–99)
NonHDL: 197.63
Total CHOL/HDL Ratio: 5
Triglycerides: 123 mg/dL (ref 0.0–149.0)
VLDL: 24.6 mg/dL (ref 0.0–40.0)

## 2023-10-24 LAB — COMPREHENSIVE METABOLIC PANEL WITH GFR
ALT: 29 U/L (ref 0–35)
AST: 20 U/L (ref 0–37)
Albumin: 4.4 g/dL (ref 3.5–5.2)
Alkaline Phosphatase: 79 U/L (ref 39–117)
BUN: 13 mg/dL (ref 6–23)
CO2: 27 meq/L (ref 19–32)
Calcium: 10.1 mg/dL (ref 8.4–10.5)
Chloride: 107 meq/L (ref 96–112)
Creatinine, Ser: 0.82 mg/dL (ref 0.40–1.20)
GFR: 72.06 mL/min (ref >60.00)
Glucose, Bld: 85 mg/dL (ref 70–99)
Potassium: 3.7 meq/L (ref 3.5–5.1)
Sodium: 141 meq/L (ref 135–145)
Total Bilirubin: 0.4 mg/dL (ref 0.2–1.2)
Total Protein: 7.7 g/dL (ref 6.0–8.3)

## 2023-10-24 LAB — TSH: TSH: 0.81 u[IU]/mL (ref 0.35–5.50)
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
## 2023-10-25 ENCOUNTER — Ambulatory Visit: Payer: Self-pay | Admitting: Internal Medicine

## 2023-10-26 ENCOUNTER — Other Ambulatory Visit: Payer: Self-pay | Admitting: Internal Medicine

## 2023-10-28 ENCOUNTER — Other Ambulatory Visit: Payer: Self-pay | Admitting: Internal Medicine

## 2023-11-14 ENCOUNTER — Other Ambulatory Visit: Payer: Self-pay | Admitting: Internal Medicine

## 2023-11-14 NOTE — Telephone Encounter (Unsigned)
 Copied from CRM [id number]. Topic: Clinical - Medication Refill >> Nov 14, 2023  1:31 PM Robinson H wrote: Medication: HYDROcodone -acetaminophen  (NORCO/VICODIN) 5-325 MG tablet   Has the patient contacted their pharmacy? Yes, has to reach out to provider (Agent: If no, request that the patient contact the pharmacy for the refill. If patient does not wish to contact the pharmacy document the reason why and proceed with request.) (Agent: If yes, when and what did the pharmacy advise?)  This is the patient's preferred pharmacy:    [facility] ST[id number] [address], [place] - [address] AT [facility] [address] [place] [address] Phone: [phone] Fax: [phone]    Is this the correct pharmacy for this prescription? Yes If no, delete pharmacy and type the correct one.   Has the prescription been filled recently? No  Is the patient out of the medication? Yes  Has the patient been seen for an appointment in the last year OR does the patient have an upcoming appointment? Yes  Can we respond through MyChart? Yes  Agent: Please be advised that Rx refills may take up to 3 business days. We ask that you follow-up with your pharmacy.

## 2023-11-17 MED ORDER — HYDROCODONE-ACETAMINOPHEN 5-325 MG PO TABS: 1.0000 | ORAL_TABLET | Freq: Three times a day (TID) | ORAL | 0 refills | Status: DC | PRN

## 2023-12-07 ENCOUNTER — Ambulatory Visit: Payer: Medicare (Managed Care) | Admitting: Internal Medicine

## 2023-12-07 ENCOUNTER — Encounter: Payer: Self-pay | Admitting: Internal Medicine

## 2023-12-07 VITALS — BP 130/82 | HR 84 | Temp 98.2°F | Ht 65.5 in | Wt 170.0 lb

## 2023-12-07 DIAGNOSIS — Z0001 Encounter for general adult medical examination with abnormal findings: Secondary | ICD-10-CM | POA: Diagnosis not present

## 2023-12-07 DIAGNOSIS — E890 Postprocedural hypothyroidism: Secondary | ICD-10-CM | POA: Diagnosis not present

## 2023-12-07 DIAGNOSIS — Z23 Encounter for immunization: Secondary | ICD-10-CM

## 2023-12-07 DIAGNOSIS — G8929 Other chronic pain: Secondary | ICD-10-CM

## 2023-12-07 DIAGNOSIS — E559 Vitamin D deficiency, unspecified: Secondary | ICD-10-CM | POA: Diagnosis not present

## 2023-12-07 DIAGNOSIS — M25561 Pain in right knee: Secondary | ICD-10-CM

## 2023-12-07 DIAGNOSIS — Z0000 Encounter for general adult medical examination without abnormal findings: Secondary | ICD-10-CM

## 2023-12-07 DIAGNOSIS — M25562 Pain in left knee: Secondary | ICD-10-CM | POA: Diagnosis not present

## 2023-12-07 MED ORDER — LEVOCETIRIZINE DIHYDROCHLORIDE 5 MG PO TABS: 5.0000 mg | ORAL_TABLET | Freq: Every evening | ORAL | 2 refills | Status: AC

## 2023-12-07 MED ORDER — HYDROCODONE-ACETAMINOPHEN 5-325 MG PO TABS: 1.0000 | ORAL_TABLET | Freq: Three times a day (TID) | ORAL | 0 refills | Status: DC | PRN

## 2023-12-07 NOTE — Assessment & Plan Note (Signed)
Cont w/Vit D 

## 2023-12-07 NOTE — Assessment & Plan Note (Signed)
 S/p TKR R Norco prn  Potential benefits of a long term opioids use as well as potential risks (i.e. addiction risk, apnea etc) and complications (i.e. Somnolence, constipation and others) were explained to the patient and were aknowledged. DMV form

## 2023-12-07 NOTE — Progress Notes (Signed)
 Subjective:  Patient ID: Ashley Larsen, female    DOB: 01-Feb-1951  Age: 73 y.o. MRN: [id number]  CC: Annual Exam (Annual Exam)   HPI Ashley Larsen presents for a well exam C/o allergies, OA, HTN  Outpatient Medications Prior to Visit  Medication Sig Dispense Refill   amLODipine  (NORVASC ) 5 MG tablet TAKE 1 TABLET(5 MG) BY MOUTH DAILY 90 tablet 2   Artificial Tear Solution (GENTEAL TEARS) 0.1-0.2-0.3 % SOLN Place 1 drop into both eyes daily as needed (Dry eye).     Cholecalciferol (VITAMIN D3) 50 MCG (2000 UT) capsule Take 1 capsule (2,000 Units total) by mouth daily. 100 capsule 3   docusate sodium  (COLACE) 100 MG capsule Take 1 capsule (100 mg total) by mouth every 12 (twelve) hours. 60 capsule 0   fexofenadine  (ALLEGRA ) 180 MG tablet Take 1 tablet (180 mg total) by mouth daily as needed for allergies. 90 tablet 3   fluticasone  (FLONASE ) 50 MCG/ACT nasal spray Place 2 sprays into both nostrils daily. 16 g 6   gabapentin  (NEURONTIN ) 300 MG capsule Take 1 capsule (300 mg total) by mouth at bedtime as needed. 90 capsule 1   levothyroxine  (SYNTHROID ) 100 MCG tablet TAKE 1 TABLET BY MOUTH BEFORE BREAKFAST 90 tablet 4   losartan  (COZAAR ) 100 MG tablet TAKE 1 TABLET(100 MG) BY MOUTH DAILY 90 tablet 3   Na Sulfate-K Sulfate-Mg Sulf 17.5-3.13-1.6 GM/177ML SOLN SMARTSIG:1 Kit(s) By Mouth Once     omeprazole  (PRILOSEC) 20 MG capsule Take 1 capsule (20 mg total) by mouth daily. 30 capsule 0   potassium chloride  (KLOR-CON ) 10 MEQ tablet TAKE 1 TABLET(10 MEQ) BY MOUTH DAILY 90 tablet 1   XARELTO  20 MG TABS tablet TAKE 1 TABLET(20 MG) BY MOUTH DAILY 90 tablet 3   HYDROcodone -acetaminophen  (NORCO/VICODIN) 5-325 MG tablet Take 1 tablet by mouth every 8 (eight) hours as needed for severe pain (pain score 7-10). 90 tablet 0   No facility-administered medications prior to visit.    ROS: Review of Systems  Constitutional:  Negative for activity change, appetite change, chills, fatigue and unexpected  weight change.  HENT:  Negative for congestion, mouth sores and sinus pressure.   Eyes:  Negative for visual disturbance.  Respiratory:  Negative for cough and chest tightness.   Gastrointestinal:  Negative for abdominal pain and nausea.  Genitourinary:  Negative for difficulty urinating, frequency and vaginal pain.  Musculoskeletal:  Positive for arthralgias and gait problem. Negative for back pain.  Skin:  Negative for pallor and rash.  Neurological:  Negative for dizziness, tremors, weakness, numbness and headaches.  Psychiatric/Behavioral:  Negative for confusion and sleep disturbance.     Objective:  BP 130/82   Pulse 84   Temp 98.2 F (36.8 C)   Ht 5' 5.5 (1.664 m)   Wt 170 lb (77.1 kg)   SpO2 99%   BMI 27.86 kg/m   BP Readings from Last 3 Encounters:  12/07/23 130/82  10/24/23 136/80  09/05/23 127/79    Wt Readings from Last 3 Encounters:  12/07/23 170 lb (77.1 kg)  10/24/23 169 lb 6.4 oz (76.8 kg)  09/05/23 169 lb (76.7 kg)    Physical Exam Constitutional:      General: She is not in acute distress.    Appearance: She is well-developed. She is obese.  HENT:     Head: Normocephalic.     Right Ear: External ear normal.     Left Ear: External ear normal.     Nose: Nose normal.  Eyes:     General:        Right eye: No discharge.        Left eye: No discharge.     Conjunctiva/sclera: Conjunctivae normal.     Pupils: Pupils are equal, round, and reactive to light.  Neck:     Thyroid : No thyromegaly.     Vascular: No JVD.     Trachea: No tracheal deviation.  Cardiovascular:     Rate and Rhythm: Normal rate and regular rhythm.     Heart sounds: Normal heart sounds.  Pulmonary:     Effort: No respiratory distress.     Breath sounds: No stridor. No wheezing.  Abdominal:     General: Bowel sounds are normal. There is no distension.     Palpations: Abdomen is soft. There is no mass.     Tenderness: There is no abdominal tenderness. There is no guarding or  rebound.  Musculoskeletal:        General: Tenderness present.     Cervical back: Normal range of motion and neck supple. No rigidity.  Lymphadenopathy:     Cervical: No cervical adenopathy.  Skin:    Findings: No erythema or rash.  Neurological:     Cranial Nerves: No cranial nerve deficit.     Motor: No abnormal muscle tone.     Coordination: Coordination normal.     Gait: Gait abnormal.     Deep Tendon Reflexes: Reflexes normal.  Psychiatric:        Behavior: Behavior normal.        Thought Content: Thought content normal.        Judgment: Judgment normal.     Lab Results  Component Value Date   WBC 4.3 10/24/2023   HGB 12.4 10/24/2023   HCT 37.1 10/24/2023   PLT 258.0 10/24/2023   GLUCOSE 85 10/24/2023   CHOL 245 (H) 10/24/2023   TRIG 123.0 10/24/2023   HDL 47.30 10/24/2023   LDLDIRECT 185.7 01/19/2012   LDLCALC 173 (H) 10/24/2023   ALT 29 10/24/2023   AST 20 10/24/2023   NA 141 10/24/2023   K 3.7 10/24/2023   CL 107 10/24/2023   CREATININE 0.82 10/24/2023   BUN 13 10/24/2023   CO2 27 10/24/2023   TSH 0.81 10/24/2023   INR 1.0 07/24/2019   HGBA1C 5.6 06/11/2021    No results found.  Assessment & Plan:   Problem List Items Addressed This Visit     Hypothyroidism, postablative - Primary   On Levothroid      Knee pain, bilateral    S/p TKR R Norco prn  Potential benefits of a long term opioids use as well as potential risks (i.e. addiction risk, apnea etc) and complications (i.e. Somnolence, constipation and others) were explained to the patient and were aknowledged. DMV form      Vitamin D  deficiency   Cont w/Vit D      Well adult exam   We discussed age appropriate health related issues, including available/recomended screening tests and vaccinations. We discussed a need for adhering to healthy diet and exercise. Labs/EKG were reviewed/ordered. All questions were answered. Last colon 2024 Mammo q 12 mo         Meds ordered this encounter   Medications   levocetirizine (XYZAL) 5 MG tablet    Sig: Take 1 tablet (5 mg total) by mouth every evening.    Dispense:  90 tablet    Refill:  2   HYDROcodone -acetaminophen  (NORCO/VICODIN) 5-325 MG tablet  Sig: Take 1 tablet by mouth every 8 (eight) hours as needed for severe pain (pain score 7-10).    Dispense:  90 tablet    Refill:  0    Please fill on or after 12/17/2023. M25.561, M25.562      Follow-up: Return in about 3 months (around 03/08/2024) for a follow-up visit.  Marolyn Noel, MD

## 2023-12-07 NOTE — Assessment & Plan Note (Signed)
 On Levothroid

## 2023-12-07 NOTE — Addendum Note (Signed)
 Addended byBETHA LUCETTA CLEATRICE LELON on: 12/07/2023 11:10 AM   Modules accepted: Orders

## 2023-12-07 NOTE — Assessment & Plan Note (Addendum)
 We discussed age appropriate health related issues, including available/recomended screening tests and vaccinations. We discussed a need for adhering to healthy diet and exercise. Labs/EKG were reviewed/ordered. All questions were answered. Last colon 2024 Mammo q 12 mo

## 2024-01-05 ENCOUNTER — Other Ambulatory Visit: Payer: Medicare (Managed Care)

## 2024-01-16 ENCOUNTER — Other Ambulatory Visit: Payer: Self-pay | Admitting: Internal Medicine

## 2024-01-18 ENCOUNTER — Other Ambulatory Visit: Payer: Self-pay | Admitting: Endocrinology

## 2024-01-18 DIAGNOSIS — E890 Postprocedural hypothyroidism: Secondary | ICD-10-CM

## 2024-02-02 ENCOUNTER — Other Ambulatory Visit: Payer: Self-pay | Admitting: Internal Medicine

## 2024-02-02 NOTE — Telephone Encounter (Unsigned)
 Copied from CRM #[id number]. Topic: Clinical - Medication Refill >> Feb 02, 2024  4:34 PM Nessti S wrote: Medication: losartan  (COZAAR ) 100 MG tablet  Has the patient contacted their pharmacy? Yes (Agent: If no, request that the patient contact the pharmacy for the refill. If patient does not wish to contact the pharmacy document the reason why and proceed with request.) (Agent: If yes, when and what did the pharmacy advise?)  This is the patient's preferred pharmacy:  [facility] ST[facility] #[id number] [address], [place] - [address] AT [facility] [address] [place] [address] Phone: [phone] Fax: [phone]  Is this the correct pharmacy for this prescription? Yes If no, delete pharmacy and type the correct one.   Has the prescription been filled recently? No  Is the patient out of the medication? Yes  Has the patient been seen for an appointment in the last year OR does the patient have an upcoming appointment? Yes  Can we respond through MyChart? Yes  Agent: Please be advised that Rx refills may take up to 3 business days. We ask that you follow-up with your pharmacy.

## 2024-02-03 ENCOUNTER — Other Ambulatory Visit: Payer: Self-pay | Admitting: Internal Medicine

## 2024-02-03 MED ORDER — LOSARTAN POTASSIUM 100 MG PO TABS: 100.0000 mg | ORAL_TABLET | Freq: Every day | ORAL | 3 refills | Status: AC

## 2024-03-08 ENCOUNTER — Encounter: Payer: Self-pay | Admitting: Internal Medicine

## 2024-03-08 ENCOUNTER — Ambulatory Visit: Payer: Medicare (Managed Care) | Admitting: Internal Medicine

## 2024-03-08 VITALS — HR 92 | Ht 65.5 in | Wt 168.6 lb

## 2024-03-08 DIAGNOSIS — Z7901 Long term (current) use of anticoagulants: Secondary | ICD-10-CM

## 2024-03-08 DIAGNOSIS — I10 Essential (primary) hypertension: Secondary | ICD-10-CM | POA: Diagnosis not present

## 2024-03-08 DIAGNOSIS — M545 Low back pain, unspecified: Secondary | ICD-10-CM | POA: Diagnosis not present

## 2024-03-08 DIAGNOSIS — E559 Vitamin D deficiency, unspecified: Secondary | ICD-10-CM

## 2024-03-08 DIAGNOSIS — G8929 Other chronic pain: Secondary | ICD-10-CM

## 2024-03-08 MED ORDER — HYDROCODONE-ACETAMINOPHEN 5-325 MG PO TABS: 1.0000 | ORAL_TABLET | Freq: Three times a day (TID) | ORAL | 0 refills | Status: DC | PRN

## 2024-03-08 MED ORDER — HYDROCODONE-ACETAMINOPHEN 5-325 MG PO TABS: 1.0000 | ORAL_TABLET | Freq: Three times a day (TID) | ORAL | 0 refills | Status: AC | PRN

## 2024-03-08 NOTE — Progress Notes (Signed)
 "  Subjective:  Patient ID: Ashley Larsen, female    DOB: [birth year]  Age: 74 y.o. MRN: [id number]  CC: Medical Management of Chronic Issues (3 Month follow up)   HPI Ashley Larsen presents for h/o DVT, LBP, hypothyroidism  Outpatient Medications Prior to Visit  Medication Sig Dispense Refill   amLODipine  (NORVASC ) 5 MG tablet TAKE 1 TABLET(5 MG) BY MOUTH DAILY 90 tablet 2   Artificial Tear Solution (GENTEAL TEARS) 0.1-0.2-0.3 % SOLN Place 1 drop into both eyes daily as needed (Dry eye).     Cholecalciferol (VITAMIN D3) 50 MCG (2000 UT) capsule Take 1 capsule (2,000 Units total) by mouth daily. 100 capsule 3   docusate sodium  (COLACE) 100 MG capsule Take 1 capsule (100 mg total) by mouth every 12 (twelve) hours. 60 capsule 0   fexofenadine  (ALLEGRA ) 180 MG tablet Take 1 tablet (180 mg total) by mouth daily as needed for allergies. 90 tablet 3   fluticasone  (FLONASE ) 50 MCG/ACT nasal spray Place 2 sprays into both nostrils daily. 16 g 6   gabapentin  (NEURONTIN ) 300 MG capsule Take 1 capsule (300 mg total) by mouth at bedtime as needed. 90 capsule 1   levocetirizine (XYZAL ) 5 MG tablet Take 1 tablet (5 mg total) by mouth every evening. 90 tablet 2   levothyroxine  (SYNTHROID ) 100 MCG tablet TAKE 1 TABLET BY MOUTH BEFORE BREAKFAST 90 tablet 4   losartan  (COZAAR ) 100 MG tablet Take 1 tablet (100 mg total) by mouth daily. 90 tablet 3   Na Sulfate-K Sulfate-Mg Sulf 17.5-3.13-1.6 GM/177ML SOLN SMARTSIG:1 Kit(s) By Mouth Once     omeprazole  (PRILOSEC) 20 MG capsule Take 1 capsule (20 mg total) by mouth daily. 30 capsule 0   potassium chloride  (KLOR-CON ) 10 MEQ tablet TAKE 1 TABLET(10 MEQ) BY MOUTH DAILY 90 tablet 1   XARELTO  20 MG TABS tablet TAKE 1 TABLET(20 MG) BY MOUTH DAILY 90 tablet 3   HYDROcodone -acetaminophen  (NORCO/VICODIN) 5-325 MG tablet Take 1 tablet by mouth every 8 (eight) hours as needed for severe pain (pain score 7-10). 90 tablet 0   No facility-administered medications prior to  visit.    ROS: Review of Systems  Constitutional:  Negative for activity change, appetite change, chills, fatigue and unexpected weight change.  HENT:  Negative for congestion, mouth sores and sinus pressure.   Eyes:  Negative for visual disturbance.  Respiratory:  Negative for cough and chest tightness.   Gastrointestinal:  Negative for abdominal pain and nausea.  Genitourinary:  Negative for difficulty urinating, frequency and vaginal pain.  Musculoskeletal:  Negative for back pain and gait problem.  Skin:  Negative for pallor and rash.  Neurological:  Negative for dizziness, tremors, weakness, numbness and headaches.  Psychiatric/Behavioral:  Negative for confusion, sleep disturbance and suicidal ideas.     Objective:  Pulse 92   Ht 5' 5.5 (1.664 m)   Wt 168 lb 9.6 oz (76.5 kg)   SpO2 99%   BMI 27.63 kg/m   BP Readings from Last 3 Encounters:  12/07/23 130/82  10/24/23 136/80  09/05/23 127/79    Wt Readings from Last 3 Encounters:  03/08/24 168 lb 9.6 oz (76.5 kg)  12/07/23 170 lb (77.1 kg)  10/24/23 169 lb 6.4 oz (76.8 kg)    Physical Exam Constitutional:      General: She is not in acute distress.    Appearance: She is well-developed.  HENT:     Head: Normocephalic.     Right Ear: External ear normal.  Left Ear: External ear normal.     Nose: Nose normal.  Eyes:     General:        Right eye: No discharge.        Left eye: No discharge.     Conjunctiva/sclera: Conjunctivae normal.     Pupils: Pupils are equal, round, and reactive to light.  Neck:     Thyroid : No thyromegaly.     Vascular: No JVD.     Trachea: No tracheal deviation.  Cardiovascular:     Rate and Rhythm: Normal rate and regular rhythm.     Heart sounds: Normal heart sounds.  Pulmonary:     Effort: No respiratory distress.     Breath sounds: No stridor. No wheezing.  Abdominal:     General: Bowel sounds are normal. There is no distension.     Palpations: Abdomen is soft. There is  no mass.     Tenderness: There is no abdominal tenderness. There is no guarding or rebound.  Musculoskeletal:        General: No tenderness.     Cervical back: Normal range of motion and neck supple. No rigidity.  Lymphadenopathy:     Cervical: No cervical adenopathy.  Skin:    Findings: No erythema or rash.  Neurological:     Mental Status: She is oriented to person, place, and time.     Cranial Nerves: No cranial nerve deficit.     Motor: No abnormal muscle tone.     Coordination: Coordination normal.     Deep Tendon Reflexes: Reflexes normal.  Psychiatric:        Behavior: Behavior normal.        Thought Content: Thought content normal.        Judgment: Judgment normal.     Lab Results  Component Value Date   WBC 4.3 10/24/2023   HGB 12.4 10/24/2023   HCT 37.1 10/24/2023   PLT 258.0 10/24/2023   GLUCOSE 85 10/24/2023   CHOL 245 (H) 10/24/2023   TRIG 123.0 10/24/2023   HDL 47.30 10/24/2023   LDLDIRECT 185.7 01/19/2012   LDLCALC 173 (H) 10/24/2023   ALT 29 10/24/2023   AST 20 10/24/2023   NA 141 10/24/2023   K 3.7 10/24/2023   CL 107 10/24/2023   CREATININE 0.82 10/24/2023   BUN 13 10/24/2023   CO2 27 10/24/2023   TSH 0.81 10/24/2023   INR 1.0 07/24/2019   HGBA1C 5.6 06/11/2021    No results found.  Assessment & Plan:   Problem List Items Addressed This Visit     Essential hypertension - Primary   Cont on Losartan , Norvasc  Drink more      Low back pain   Doing well today Norco prn  Potential benefits of a long term opioids use as well as potential risks (i.e. addiction risk, apnea etc) and complications (i.e. Somnolence, constipation and others) were explained to the patient and were aknowledged.      Relevant Medications   HYDROcodone -acetaminophen  (NORCO/VICODIN) 5-325 MG tablet   Vitamin D  deficiency   Cont w/Vit D      Anticoagulated   Chronic On Xarelto  - $$$.  We will try to get help with Xarelto  coverage Call 888-XARELTO  [phone])  so we can let you know if and when you become eligible for [facility], as well as provide you with any other program updates.         Meds ordered this encounter  Medications   DISCONTD: HYDROcodone -acetaminophen  (NORCO/VICODIN) 5-325  MG tablet    Sig: Take 1 tablet by mouth every 8 (eight) hours as needed for severe pain (pain score 7-10).    Dispense:  90 tablet    Refill:  0    Please fill on or after 12/17/2023. M25.561, M25.562   HYDROcodone -acetaminophen  (NORCO/VICODIN) 5-325 MG tablet    Sig: Take 1 tablet by mouth every 8 (eight) hours as needed for severe pain (pain score 7-10).    Dispense:  90 tablet    Refill:  0    Please fill on or after 1/ 21/2026. M25.561, M25.562      Follow-up: Return in about 3 months (around 06/06/2024) for a follow-up visit.  Marolyn Noel, MD "

## 2024-03-08 NOTE — Assessment & Plan Note (Signed)
 Doing well today Norco prn  Potential benefits of a long term opioids use as well as potential risks (i.e. addiction risk, apnea etc) and complications (i.e. Somnolence, constipation and others) were explained to the patient and were aknowledged.

## 2024-03-08 NOTE — Assessment & Plan Note (Signed)
Chronic ?On Xarelto - $$$.  We will try to get help with Xarelto coverage ?Call 888-XARELTO [phone]) so we can let you know if and when you become eligible for [facility], as well as provide you with any other program updates. ?

## 2024-03-08 NOTE — Assessment & Plan Note (Signed)
 Cont on Losartan , Norvasc  Drink more

## 2024-03-08 NOTE — Assessment & Plan Note (Signed)
Cont w/Vit D 

## 2024-03-08 NOTE — Patient Instructions (Signed)

## 2024-05-04 ENCOUNTER — Ambulatory Visit: Payer: Medicare (Managed Care)

## 2024-06-06 ENCOUNTER — Ambulatory Visit: Payer: Medicare (Managed Care) | Admitting: Internal Medicine

## 2024-10-29 ENCOUNTER — Ambulatory Visit: Payer: Medicare (Managed Care) | Admitting: Endocrinology
